# Patient Record
Sex: Female | Born: 1995
Health system: Southern US, Community
[De-identification: ages and names within clinical notes are randomized; demographics above are authoritative.]

## PROBLEM LIST (undated history)

## (undated) DIAGNOSIS — F419 Anxiety disorder, unspecified: Secondary | ICD-10-CM

## (undated) DIAGNOSIS — T7840XA Allergy, unspecified, initial encounter: Secondary | ICD-10-CM

## (undated) DIAGNOSIS — F259 Schizoaffective disorder, unspecified: Secondary | ICD-10-CM

## (undated) DIAGNOSIS — F32A Depression, unspecified: Secondary | ICD-10-CM

## (undated) DIAGNOSIS — F319 Bipolar disorder, unspecified: Secondary | ICD-10-CM

## (undated) DIAGNOSIS — L309 Dermatitis, unspecified: Secondary | ICD-10-CM

## (undated) DIAGNOSIS — F312 Bipolar disorder, current episode manic severe with psychotic features: Secondary | ICD-10-CM

## (undated) DIAGNOSIS — F909 Attention-deficit hyperactivity disorder, unspecified type: Secondary | ICD-10-CM

## (undated) HISTORY — DX: Bipolar disorder, current episode manic severe with psychotic features: F31.2

## (undated) HISTORY — DX: Allergy, unspecified, initial encounter: T78.40XA

## (undated) HISTORY — DX: Attention-deficit hyperactivity disorder, unspecified type: F90.9

## (undated) HISTORY — DX: Bipolar disorder, unspecified: F31.9

## (undated) HISTORY — DX: Dermatitis, unspecified: L30.9

---
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
## 2013-01-13 ENCOUNTER — Telehealth: Payer: Self-pay | Admitting: Family Medicine

## 2013-01-13 MED ORDER — AMPHETAMINE-DEXTROAMPHET ER 30 MG PO CP24: 30.0000 mg | ORAL_CAPSULE | ORAL | Status: DC

## 2013-01-13 NOTE — Telephone Encounter (Signed)
Patient's Mom is calling requesting a refill for the patient's Adderall. She states that the patient is taking exams this week and only has one left.

## 2013-01-13 NOTE — Telephone Encounter (Signed)
Patient's mother aware Rx ready for pick up in the morning She will have Dow Adolph pick it up.      KP

## 2013-03-16 ENCOUNTER — Encounter: Payer: Self-pay | Admitting: Family Medicine

## 2013-03-16 ENCOUNTER — Ambulatory Visit (INDEPENDENT_AMBULATORY_CARE_PROVIDER_SITE_OTHER): Payer: BC Managed Care – PPO | Admitting: Family Medicine

## 2013-03-16 VITALS — BP 96/54 | HR 75 | Temp 98.5°F | Ht 63.75 in | Wt 114.2 lb

## 2013-03-16 DIAGNOSIS — Z0000 Encounter for general adult medical examination without abnormal findings: Secondary | ICD-10-CM

## 2013-03-16 DIAGNOSIS — Z0184 Encounter for antibody response examination: Secondary | ICD-10-CM

## 2013-03-16 DIAGNOSIS — Z3009 Encounter for other general counseling and advice on contraception: Secondary | ICD-10-CM | POA: Insufficient documentation

## 2013-03-16 DIAGNOSIS — Z9189 Other specified personal risk factors, not elsewhere classified: Secondary | ICD-10-CM

## 2013-03-16 DIAGNOSIS — Z309 Encounter for contraceptive management, unspecified: Secondary | ICD-10-CM | POA: Insufficient documentation

## 2013-03-16 DIAGNOSIS — Z23 Encounter for immunization: Secondary | ICD-10-CM

## 2013-03-16 DIAGNOSIS — Z202 Contact with and (suspected) exposure to infections with a predominantly sexual mode of transmission: Secondary | ICD-10-CM

## 2013-03-16 HISTORY — DX: Encounter for general adult medical examination without abnormal findings: Z00.00

## 2013-03-16 LAB — HEPATIC FUNCTION PANEL
ALT: 10 U/L (ref 0–35)
AST: 15 U/L (ref 0–37)
Alkaline Phosphatase: 49 U/L (ref 39–117)
Bilirubin, Direct: 0.1 mg/dL (ref 0.0–0.3)
Total Protein: 7.6 g/dL (ref 6.0–8.3)

## 2013-03-16 LAB — BASIC METABOLIC PANEL
CO2: 26 mEq/L (ref 19–32)
Chloride: 106 mEq/L (ref 96–112)
Sodium: 138 mEq/L (ref 135–145)

## 2013-03-16 LAB — LIPID PANEL
LDL Cholesterol: 106 mg/dL — ABNORMAL HIGH (ref 0–99)
Total CHOL/HDL Ratio: 3

## 2013-03-16 LAB — CBC WITH DIFFERENTIAL/PLATELET
Basophils Absolute: 0 10*3/uL (ref 0.0–0.1)
Basophils Relative: 0.5 % (ref 0.0–3.0)
Eosinophils Absolute: 0.3 10*3/uL (ref 0.0–0.7)
Hemoglobin: 13.3 g/dL (ref 12.0–15.0)
Lymphocytes Relative: 43.2 % (ref 12.0–46.0)
MCHC: 32.7 g/dL (ref 30.0–36.0)
MCV: 92.5 fl (ref 78.0–100.0)
Monocytes Absolute: 0.3 10*3/uL (ref 0.1–1.0)
Neutro Abs: 2 10*3/uL (ref 1.4–7.7)
RDW: 13.8 % (ref 11.5–14.6)

## 2013-03-16 MED ORDER — AMPHETAMINE-DEXTROAMPHET ER 30 MG PO CP24: 30.0000 mg | ORAL_CAPSULE | ORAL | Status: DC

## 2013-03-16 NOTE — Progress Notes (Signed)
  Subjective:    Patient ID: [name], female    DOB: [birth year], 17 y.o.   MRN: [id number]  HPI CPE- no concerns, did have sex for 1st time last month and would like checked for STDs despite using condom.  Not interested in starting OCPs- 'i'm not doing that again'.   Review of Systems Patient reports no vision/ hearing changes, adenopathy,fever, weight change,  persistant/recurrent hoarseness , swallowing issues, chest pain, palpitations, edema, persistant/recurrent cough, hemoptysis, dyspnea (rest/exertional/paroxysmal nocturnal), gastrointestinal bleeding (melena, rectal bleeding), abdominal pain, significant heartburn, bowel changes, GU symptoms (dysuria, hematuria, incontinence), Gyn symptoms (abnormal  bleeding, pain),  syncope, focal weakness, memory loss, numbness & tingling, skin/hair/nail changes, abnormal bruising or bleeding, anxiety, or depression.     Objective:   Physical Exam General Appearance:    Alert, cooperative, no distress, appears stated age  Head:    Normocephalic, without obvious abnormality, atraumatic  Eyes:    PERRL, conjunctiva/corneas clear, EOM's intact, fundi    benign, both eyes  Ears:    Normal TM's and external ear canals, both ears  Nose:   Nares normal, septum midline, mucosa normal, no drainage    or sinus tenderness  Throat:   Lips, mucosa, and tongue normal; teeth and gums normal  Neck:   Supple, symmetrical, trachea midline, no adenopathy;    Thyroid: no enlargement/tenderness/nodules  Back:     Symmetric, no curvature, ROM normal, no CVA tenderness  Lungs:     Clear to auscultation bilaterally, respirations unlabored  Chest Wall:    No tenderness or deformity   Heart:    Regular rate and rhythm, S1 and S2 normal, no murmur, rub   or gallop  Breast Exam:    Deferred to GYN  Abdomen:     Soft, non-tender, bowel sounds active all four quadrants,    no masses, no organomegaly  Genitalia:    Deferred to GYN  Rectal:    Extremities:    Extremities normal, atraumatic, no cyanosis or edema  Pulses:   2+ and symmetric all extremities  Skin:   Skin color, texture, turgor normal, no rashes or lesions  Lymph nodes:   Cervical, supraclavicular, and axillary nodes normal  Neurologic:   CNII-XII intact, normal strength, sensation and reflexes    throughout          Assessment & Plan:

## 2013-03-16 NOTE — Patient Instructions (Addendum)
Follow up in 1 year or as needed Keep up the good work!  You look great! We'll fax all your completed forms to school Call with any questions or concerns GOOD LUCK!

## 2013-03-16 NOTE — Assessment & Plan Note (Signed)
Pt's PE WNL.  Check labs- including STDs.  Forms completed for college.  Immunizations given.  Anticipatory guidance provided.

## 2013-03-16 NOTE — Addendum Note (Signed)
Addended by: Verdie Shire on: 03/16/2013 01:39 PM   Modules accepted: Orders

## 2013-03-17 ENCOUNTER — Telehealth: Payer: Self-pay | Admitting: Family Medicine

## 2013-03-17 LAB — GC/CHLAMYDIA PROBE AMP, URINE: GC Probe Amp, Urine: NEGATIVE

## 2013-03-17 LAB — HIV ANTIBODY (ROUTINE TESTING W REFLEX): HIV: NONREACTIVE

## 2013-03-17 LAB — RPR

## 2013-03-17 LAB — VARICELLA ZOSTER ANTIBODY, IGG: Varicella IgG: >4000 Index — ABNORMAL HIGH (ref <135.00)

## 2013-03-17 NOTE — Telephone Encounter (Signed)
03/17/2013  Authorization has been obtained for Amphetamine salts er for 30 mg sig take one tab every morning. This medication is approved for 1 year start date 02/24/13 to 03/17/14 Case # [id number]

## 2013-03-17 NOTE — Telephone Encounter (Signed)
Patient's Mom called upset because she says that [facility] refuses to fill the patient's Adderall Rx without authorization to do so from our office.

## 2013-03-18 ENCOUNTER — Telehealth: Payer: Self-pay | Admitting: *Deleted

## 2013-03-18 NOTE — Telephone Encounter (Signed)
error 

## 2013-03-18 NOTE — Telephone Encounter (Signed)
LM @ (9:57am) asking the pt to RTC regarding school form needing to signed before sent to the school.//AB/CMA

## 2013-03-20 ENCOUNTER — Telehealth: Payer: Self-pay | Admitting: *Deleted

## 2013-03-20 NOTE — Telephone Encounter (Signed)
Pt mother called in with concerns with her daughter taking Adderall. Pt is having mood swings, outbursts,and anxiety. Mother advised patient not to take any more of the medication until she speaks with a provider. //SW//CMA

## 2013-03-20 NOTE — Telephone Encounter (Signed)
Detailed MSG left advising the patient to d/c med's and follow up on Monday with Dr.Lowne.      KP

## 2013-03-20 NOTE — Telephone Encounter (Signed)
Yes , the adderall can cause that.  It may be another issue---- don't take any more--  Can she come in Monday?

## 2013-03-20 NOTE — Telephone Encounter (Signed)
patient is having mood issues since starting the Adderall.  Please advise if you want the patient to come in or if you want to change med's.      KP

## 2013-03-23 NOTE — Progress Notes (Signed)
Forms completed but must have pts signature before we can fax. Left message on pts voice mail that she needs to sign forms.

## 2013-03-25 NOTE — Telephone Encounter (Signed)
Pt picked up form.//AB/CMA

## 2013-03-27 ENCOUNTER — Telehealth: Payer: Self-pay | Admitting: Family Medicine

## 2013-03-27 NOTE — Telephone Encounter (Signed)
Patient's mother called stating that they need to sign something on her school form. In the last phone note it states the patient already picked this up. Does anyone know if she still needs to sign something?

## 2013-03-30 ENCOUNTER — Ambulatory Visit: Payer: BC Managed Care – PPO | Admitting: Family Medicine

## 2013-03-30 NOTE — Telephone Encounter (Signed)
Called and LM on Friday informing the pt's mother that the formed was picked up and that the pt needed to sign the form before the form is to be turned in.//AB/CMA

## 2013-04-02 ENCOUNTER — Ambulatory Visit (INDEPENDENT_AMBULATORY_CARE_PROVIDER_SITE_OTHER): Payer: BC Managed Care – PPO | Admitting: Family Medicine

## 2013-04-02 ENCOUNTER — Encounter: Payer: Self-pay | Admitting: Family Medicine

## 2013-04-02 VITALS — BP 110/70 | HR 85 | Temp 98.5°F | Wt 120.0 lb

## 2013-04-02 DIAGNOSIS — F988 Other specified behavioral and emotional disorders with onset usually occurring in childhood and adolescence: Secondary | ICD-10-CM

## 2013-04-02 MED ORDER — LISDEXAMFETAMINE DIMESYLATE 30 MG PO CAPS: 30.0000 mg | ORAL_CAPSULE | ORAL | Status: DC

## 2013-04-02 NOTE — Patient Instructions (Signed)
Attention Deficit Hyperactivity Disorder Attention deficit hyperactivity disorder (ADHD) is a problem with behavior issues based on the way the brain functions (neurobehavioral disorder). It is a common reason for behavior and academic problems in school. CAUSES  The cause of ADHD is unknown in most cases. It may run in families. It sometimes can be associated with learning disabilities and other behavioral problems. SYMPTOMS  There are 3 types of ADHD. The 3 types and some of the symptoms include:  Inattentive  Gets bored or distracted easily.  Loses or forgets things. Forgets to hand in homework.  Has trouble organizing or completing tasks.  Difficulty staying on task.  An inability to organize daily tasks and school work.  Leaving projects, chores, or homework unfinished.  Trouble paying attention or responding to details. Careless mistakes.  Difficulty following directions. Often seems like is not listening.  Dislikes activities that require sustained attention (like chores or homework).  Hyperactive-impulsive  Feels like it is impossible to sit still or stay in a seat. Fidgeting with hands and feet.  Trouble waiting turn.  Talking too much or out of turn. Interruptive.  Speaks or acts impulsively.  Aggressive, disruptive behavior.  Constantly busy or on the go, noisy.  Combined  Has symptoms of both of the above. Often children with ADHD feel discouraged about themselves and with school. They often perform well below their abilities in school. These symptoms can cause problems in home, school, and in relationships with peers. As children get older, the excess motor activities can calm down, but the problems with paying attention and staying organized persist. Most children do not outgrow ADHD but with good treatment can learn to cope with the symptoms. DIAGNOSIS  When ADHD is suspected, the diagnosis should be made by professionals trained in ADHD.  Diagnosis will  include:  Ruling out other reasons for the child's behavior.  The caregivers will check with the child's school and check their medical records.  They will talk to teachers and parents.  Behavior rating scales for the child will be filled out by those dealing with the child on a daily basis. A diagnosis is made only after all information has been considered. TREATMENT  Treatment usually includes behavioral treatment often along with medicines. It may include stimulant medicines. The stimulant medicines decrease impulsivity and hyperactivity and increase attention. Other medicines used include antidepressants and certain blood pressure medicines. Most experts agree that treatment for ADHD should address all aspects of the child's functioning. Treatment should not be limited to the use of medicines alone. Treatment should include structured classroom management. The parents must receive education to address rewarding good behavior, discipline, and limit-setting. Tutoring or behavioral therapy or both should be available for the child. If untreated, the disorder can have long-term serious effects into adolescence and adulthood. HOME CARE INSTRUCTIONS   Often with ADHD there is a lot of frustration among the family in dealing with the illness. There is often blame and anger that is not warranted. This is a life long illness. There is no way to prevent ADHD. In many cases, because the problem affects the family as a whole, the entire family may need help. A therapist can help the family find better ways to handle the disruptive behaviors and promote change. If the child is young, most of the therapist's work is with the parents. Parents will learn techniques for coping with and improving their child's behavior. Sometimes only the child with the ADHD needs counseling. Your caregivers can help   you make these decisions.  Children with ADHD may need help in organizing. Some helpful tips include:  Keep  routines the same every day from wake-up time to bedtime. Schedule everything. This includes homework and playtime. This should include outdoor and indoor recreation. Keep the schedule on the refrigerator or a bulletin board where it is frequently seen. Mark schedule changes as far in advance as possible.  Have a place for everything and keep everything in its place. This includes clothing, backpacks, and school supplies.  Encourage writing down assignments and bringing home needed books.  Offer your child a well-balanced diet. Breakfast is especially important for school performance. Children should avoid drinks with caffeine including:  Soft drinks.  Coffee.  Tea.  However, some older children (adolescents) may find these drinks helpful in improving their attention.  Children with ADHD need consistent rules that they can understand and follow. If rules are followed, give small rewards. Children with ADHD often receive, and expect, criticism. Look for good behavior and praise it. Set realistic goals. Give clear instructions. Look for activities that can foster success and self-esteem. Make time for pleasant activities with your child. Give lots of affection.  Parents are their children's greatest advocates. Learn as much as possible about ADHD. This helps you become a stronger and better advocate for your child. It also helps you educate your child's teachers and instructors if they feel inadequate in these areas. Parent support groups are often helpful. A national group with local chapters is called CHADD (Children and Adults with Attention Deficit Hyperactivity Disorder). PROGNOSIS  There is no cure for ADHD. Children with the disorder seldom outgrow it. Many find adaptive ways to accommodate the ADHD as they mature. SEEK MEDICAL CARE IF:  Your child has repeated muscle twitches, cough or speech outbursts.  Your child has sleep problems.  Your child has a marked loss of  appetite.  Your child develops depression.  Your child has new or worsening behavioral problems.  Your child develops dizziness.  Your child has a racing heart.  Your child has stomach pains.  Your child develops headaches. Document Released: 07/20/2002 Document Revised: 10/22/2011 Document Reviewed: 03/01/2008 [employer] Patient Information 2014 [employer].  

## 2013-04-03 ENCOUNTER — Telehealth: Payer: Self-pay

## 2013-04-03 NOTE — Telephone Encounter (Signed)
Message left on triage voicemail: patient took rx to the pharmacy and was told we will need to do a prior authorization in order for rx to be dispensed.   I called patient's mother left message on voicemail to inform her message received and I will forward info to Aisha (Materials engineer), once proper paperwork with PA number received from the pharmacy the PA process will be started. If patient's mother with questions or concerns I instructed her to call back

## 2013-04-03 NOTE — Telephone Encounter (Signed)
A prior Authorization was started 04/02/2013 for the prescription vyvanse. It could take up to 72 hours to get a response from the insurance.  When we hear something back from the insurance all information will be forwarded to the Pharmacy so that the prescription can be approved.  Ag cma

## 2013-04-04 NOTE — Progress Notes (Signed)
  Subjective:    Patient ID: [name], female    DOB: May 09, 1996, 17 y.o.   MRN: [id number]  HPI Pt here to discuss changing adderall but it caused her to have mood swings, inc anger, anxiety etc.  No other complaints.   Review of Systems    as above Objective:   Physical Exam  BP 110/70  Pulse 85  Temp(Src) 98.5 F (36.9 C)  Wt 120 lb (54.432 kg)  BMI 20.77 kg/m2  SpO2 98%  LMP 03/09/2013 General appearance: alert, cooperative, appears stated age and no distress Neck: no adenopathy, supple, symmetrical, trachea midline and thyroid not enlarged, symmetric, no tenderness/mass/nodules Lungs: clear to auscultation bilaterally Heart: S1, S2 normal Extremities: extremities normal, atraumatic, no cyanosis or edema       Assessment & Plan:

## 2013-04-04 NOTE — Assessment & Plan Note (Signed)
D/c adderall vyvanse rx given to pt  rto 1 month

## 2013-04-07 ENCOUNTER — Encounter: Payer: Self-pay | Admitting: Family Medicine

## 2013-04-09 NOTE — Telephone Encounter (Signed)
04/09/2013 the patient has been approved for Vyvanse effective dates are 03/18/2013 thru 04/08/14.  This information will be forwarded to patient pharmacy and then scanned into epic.  Ag cma

## 2013-06-09 ENCOUNTER — Telehealth: Payer: Self-pay | Admitting: Family Medicine

## 2013-06-09 MED ORDER — AMPHETAMINE-DEXTROAMPHET ER 30 MG PO CP24: 30.0000 mg | ORAL_CAPSULE | ORAL | Status: DC

## 2013-06-09 NOTE — Telephone Encounter (Signed)
Please advise      KP 

## 2013-06-09 NOTE — Telephone Encounter (Signed)
Ok to switch to adderall xr 30 mg #30  1 po qam

## 2013-06-09 NOTE — Telephone Encounter (Signed)
Patient's Mom says that the Vyvanse did not work for the patient and she has had her recently taking some Adderall that the patient had left from before Dr. Laury Axon had her stop using it. Mom states that the patient has had her grades improve since using the Adderall and it seems to be working well. She wants to know if she can start back taking the Adderall. Please advise.

## 2013-06-09 NOTE — Telephone Encounter (Signed)
MSG left advising Rx ready for pick up.     KP 

## 2013-08-26 ENCOUNTER — Other Ambulatory Visit: Payer: Self-pay | Admitting: Family Medicine

## 2013-08-26 DIAGNOSIS — F988 Other specified behavioral and emotional disorders with onset usually occurring in childhood and adolescence: Secondary | ICD-10-CM

## 2013-08-26 MED ORDER — AMPHETAMINE-DEXTROAMPHET ER 30 MG PO CP24: 30.0000 mg | ORAL_CAPSULE | ORAL | Status: DC

## 2013-12-11 DIAGNOSIS — L309 Dermatitis, unspecified: Secondary | ICD-10-CM

## 2013-12-11 HISTORY — DX: Dermatitis, unspecified: L30.9

## 2014-01-08 ENCOUNTER — Ambulatory Visit: Payer: BC Managed Care – PPO | Admitting: Family Medicine

## 2014-01-08 DIAGNOSIS — Z0289 Encounter for other administrative examinations: Secondary | ICD-10-CM

## 2014-01-18 ENCOUNTER — Ambulatory Visit (INDEPENDENT_AMBULATORY_CARE_PROVIDER_SITE_OTHER): Payer: BC Managed Care – PPO | Admitting: Family Medicine

## 2014-01-18 ENCOUNTER — Encounter: Payer: Self-pay | Admitting: Family Medicine

## 2014-01-18 VITALS — BP 110/68 | HR 69 | Temp 98.7°F | Wt 124.0 lb

## 2014-01-18 DIAGNOSIS — Z309 Encounter for contraceptive management, unspecified: Secondary | ICD-10-CM

## 2014-01-18 DIAGNOSIS — R82998 Other abnormal findings in urine: Secondary | ICD-10-CM

## 2014-01-18 DIAGNOSIS — Z202 Contact with and (suspected) exposure to infections with a predominantly sexual mode of transmission: Secondary | ICD-10-CM

## 2014-01-18 DIAGNOSIS — R8290 Unspecified abnormal findings in urine: Secondary | ICD-10-CM

## 2014-01-18 LAB — POCT URINALYSIS DIPSTICK
BILIRUBIN UA: NEGATIVE
GLUCOSE UA: NEGATIVE
Ketones, UA: NEGATIVE
Nitrite, UA: NEGATIVE
Protein, UA: NEGATIVE
RBC UA: NEGATIVE
Spec Grav, UA: >=1.03
Urobilinogen, UA: 0.2
pH, UA: 5

## 2014-01-18 LAB — POCT URINE PREGNANCY: Preg Test, Ur: NEGATIVE

## 2014-01-18 MED ORDER — NORGESTIMATE-ETH ESTRADIOL 0.25-35 MG-MCG PO TABS: 1.0000 | ORAL_TABLET | Freq: Every day | ORAL | Status: DC

## 2014-01-18 NOTE — Progress Notes (Signed)
Pre visit review using our clinic review tool, if applicable. No additional management support is needed unless otherwise documented below in the visit note. 

## 2014-01-18 NOTE — Addendum Note (Signed)
Addended by: Silvio Pate D on: 01/18/2014 03:32 PM   Modules accepted: Orders

## 2014-01-18 NOTE — Progress Notes (Signed)
   Subjective:    Patient ID: [name], female    DOB: 1996-04-15, 18 y.o.   MRN: [id number]  HPI Pt here to discuss bcp options.  She would like to be on the same pill her sister is on.   She is not currently sexually active but has been in past.  No other complaints.   Review of Systems    as above Objective:   Physical Exam BP 110/68  Pulse 69  Temp(Src) 98.7 F (37.1 C) (Oral)  Wt 124 lb (56.246 kg)  SpO2 97%  LMP 12/30/2013 General appearance: alert, cooperative, appears stated age and no distress Neurologic: Alert and oriented X 3, normal strength and tone. Normal symmetric reflexes. Normal coordination and gait       Assessment & Plan:  1. Contraception management  - POCT urine pregnancy - norgestimate-ethinyl estradiol (SPRINTEC 28) 0.25-35 MG-MCG tablet; Take 1 tablet by mouth daily.  Dispense: 3 Package; Refill: 3 - GC/chlamydia probe amp, urine - HIV antibody - RPR - HSV 2 antibody, IgG - POCT urinalysis dipstick - POCT urine pregnancy  2. Potential exposure to STD  - GC/chlamydia probe amp, urine - HIV antibody - RPR - HSV 2 antibody, IgG - POCT urinalysis dipstick - POCT urine pregnancy

## 2014-01-18 NOTE — Patient Instructions (Signed)
Contraception Choices Contraception (birth control) is the use of any methods or devices to prevent pregnancy. Below are some methods to help avoid pregnancy. HORMONAL METHODS   Contraceptive implant This is a thin, plastic tube containing progesterone hormone. It does not contain estrogen hormone. Your health care provider inserts the tube in the inner part of the upper arm. The tube can remain in place for up to 3 years. After 3 years, the implant must be removed. The implant prevents the ovaries from releasing an egg (ovulation), thickens the cervical mucus to prevent sperm from entering the uterus, and thins the lining of the inside of the uterus.  Progesterone-only injections These injections are given every 3 months by your health care provider to prevent pregnancy. This synthetic progesterone hormone stops the ovaries from releasing eggs. It also thickens cervical mucus and changes the uterine lining. This makes it harder for sperm to survive in the uterus.  Birth control pills These pills contain estrogen and progesterone hormone. They work by preventing the ovaries from releasing eggs (ovulation). They also cause the cervical mucus to thicken, preventing the sperm from entering the uterus. Birth control pills are prescribed by a health care provider.Birth control pills can also be used to treat heavy periods.  Minipill This type of birth control pill contains only the progesterone hormone. They are taken every day of each month and must be prescribed by your health care provider.  Birth control patch The patch contains hormones similar to those in birth control pills. It must be changed once a week and is prescribed by a health care provider.  Vaginal ring The ring contains hormones similar to those in birth control pills. It is left in the vagina for 3 weeks, removed for 1 week, and then a new one is put back in place. The patient must be comfortable inserting and removing the ring from the  vagina.A health care provider's prescription is necessary.  Emergency contraception Emergency contraceptives prevent pregnancy after unprotected sexual intercourse. This pill can be taken right after sex or up to 5 days after unprotected sex. It is most effective the sooner you take the pills after having sexual intercourse. Most emergency contraceptive pills are available without a prescription. Check with your pharmacist. Do not use emergency contraception as your only form of birth control. BARRIER METHODS   Female condom This is a thin sheath (latex or rubber) that is worn over the penis during sexual intercourse. It can be used with spermicide to increase effectiveness.  Female condom. This is a soft, loose-fitting sheath that is put into the vagina before sexual intercourse.  Diaphragm This is a soft, latex, dome-shaped barrier that must be fitted by a health care provider. It is inserted into the vagina, along with a spermicidal jelly. It is inserted before intercourse. The diaphragm should be left in the vagina for 6 to 8 hours after intercourse.  Cervical cap This is a round, soft, latex or plastic cup that fits over the cervix and must be fitted by a health care provider. The cap can be left in place for up to 48 hours after intercourse.  Sponge This is a soft, circular piece of polyurethane foam. The sponge has spermicide in it. It is inserted into the vagina after wetting it and before sexual intercourse.  Spermicides These are chemicals that kill or block sperm from entering the cervix and uterus. They come in the form of creams, jellies, suppositories, foam, or tablets. They do not require a   prescription. They are inserted into the vagina with an applicator before having sexual intercourse. The process must be repeated every time you have sexual intercourse. INTRAUTERINE CONTRACEPTION  Intrauterine device (IUD) This is a T-shaped device that is put in a woman's uterus during a  menstrual period to prevent pregnancy. There are 2 types:  Copper IUD This type of IUD is wrapped in copper wire and is placed inside the uterus. Copper makes the uterus and fallopian tubes produce a fluid that kills sperm. It can stay in place for 10 years.  Hormone IUD This type of IUD contains the hormone progestin (synthetic progesterone). The hormone thickens the cervical mucus and prevents sperm from entering the uterus, and it also thins the uterine lining to prevent implantation of a fertilized egg. The hormone can weaken or kill the sperm that get into the uterus. It can stay in place for 3 5 years, depending on which type of IUD is used. PERMANENT METHODS OF CONTRACEPTION  Female tubal ligation This is when the woman's fallopian tubes are surgically sealed, tied, or blocked to prevent the egg from traveling to the uterus.  Hysteroscopic sterilization This involves placing a small coil or insert into each fallopian tube. Your doctor uses a technique called hysteroscopy to do the procedure. The device causes scar tissue to form. This results in permanent blockage of the fallopian tubes, so the sperm cannot fertilize the egg. It takes about 3 months after the procedure for the tubes to become blocked. You must use another form of birth control for these 3 months.  Female sterilization This is when the female has the tubes that carry sperm tied off (vasectomy).This blocks sperm from entering the vagina during sexual intercourse. After the procedure, the man can still ejaculate fluid (semen). NATURAL PLANNING METHODS  Natural family planning This is not having sexual intercourse or using a barrier method (condom, diaphragm, cervical cap) on days the woman could become pregnant.  Calendar method This is keeping track of the length of each menstrual cycle and identifying when you are fertile.  Ovulation method This is avoiding sexual intercourse during ovulation.  Symptothermal method This is  avoiding sexual intercourse during ovulation, using a thermometer and ovulation symptoms.  Post ovulation method This is timing sexual intercourse after you have ovulated. Regardless of which type or method of contraception you choose, it is important that you use condoms to protect against the transmission of sexually transmitted infections (STIs). Talk with your health care provider about which form of contraception is most appropriate for you. Document Released: 07/30/2005 Document Revised: 04/01/2013 Document Reviewed: 01/22/2013 [facility] Patient Information 2014 [place], [place]. Oral Contraception Information Oral contraceptive pills (OCPs) are medicines taken to prevent pregnancy. OCPs work by preventing the ovaries from releasing eggs. The hormones in OCPs also cause the cervical mucus to thicken, preventing the sperm from entering the uterus. The hormones also cause the uterine lining to become thin, not allowing a fertilized egg to attach to the inside of the uterus. OCPs are highly effective when taken exactly as prescribed. However, OCPs do not prevent sexually transmitted diseases (STDs). Safe sex practices, such as using condoms along with the pill, can help prevent STDs.  Before taking the pill, you may have a physical exam and Pap test. Your health care provider may order blood tests. The health care provider will make sure you are a good candidate for oral contraception. Discuss with your health care provider the possible side effects of the OCP you may  be prescribed. When starting an OCP, it can take 2 to 3 months for the body to adjust to the changes in hormone levels in your body.  TYPES OF ORAL CONTRACEPTION  The combination pill This pill contains estrogen and progestin (synthetic progesterone) hormones. The combination pill comes in 21-day, 28-day, or 91-day packs. Some types of combination pills are meant to be taken continuously (365-day pills). With 21-day packs, you do not take  pills for 7 days after the last pill. With 28-day packs, the pill is taken every day. The last 7 pills are without hormones. Certain types of pills have more than 21 hormone-containing pills. With 91-day packs, the first 84 pills contain both hormones, and the last 7 pills contain no hormones or contain estrogen only.  The minipill This pill contains the progesterone hormone only. The pill is taken every day continuously. It is very important to take the pill at the same time each day. The minipill comes in packs of 28 pills. All 28 pills contain the hormone.  ADVANTAGES OF ORAL CONTRACEPTIVE PILLS  Decreases premenstrual symptoms.   Treats menstrual period cramps.   Regulates the menstrual cycle.   Decreases a heavy menstrual flow.   May treatacne, depending on the type of pill.   Treats abnormal uterine bleeding.   Treats polycystic ovarian syndrome.   Treats endometriosis.   Can be used as emergency contraception.  THINGS THAT CAN MAKE ORAL CONTRACEPTIVE PILLS LESS EFFECTIVE OCPs can be less effective if:   You forget to take the pill at the same time every day.   You have a stomach or intestinal disease that lessens the absorption of the pill.   You take OCPs with other medicines that make OCPs less effective, such as antibiotics, certain HIV medicines, and some seizure medicines.   You take expired OCPs.   You forget to restart the pill on day 7, when using the packs of 21 pills.  RISKS ASSOCIATED WITH ORAL CONTRACEPTIVE PILLS  Oral contraceptive pills can sometimes cause side effects, such as:  Headache.  Nausea.  Breast tenderness.  Irregular bleeding or spotting. Combination pills are also associated with a small increased risk of:  Blood clots.  Heart attack.  Stroke. Document Released: 10/20/2002 Document Revised: 05/20/2013 Document Reviewed: 01/18/2013 [facility] Patient Information 2014 [place].

## 2014-01-19 ENCOUNTER — Telehealth: Payer: Self-pay

## 2014-01-19 LAB — GC/CHLAMYDIA PROBE AMP, URINE
Chlamydia, Swab/Urine, PCR: POSITIVE — AB
GC PROBE AMP, URINE: NEGATIVE

## 2014-01-19 LAB — URINE CULTURE
Colony Count: NO GROWTH
Organism ID, Bacteria: NO GROWTH

## 2014-01-19 LAB — RPR

## 2014-01-19 LAB — HSV 2 ANTIBODY, IGG: HSV 2 Glycoprotein G Ab, IgG: <0.1 IV

## 2014-01-19 LAB — HIV ANTIBODY (ROUTINE TESTING W REFLEX): HIV: NONREACTIVE

## 2014-01-19 MED ORDER — AZITHROMYCIN 250 MG PO TABS: ORAL_TABLET | ORAL | Status: DC

## 2014-01-19 NOTE — Telephone Encounter (Signed)
Patient has been made of the results. I advised no intercourse until partner is treated, until she is completed treatment and 7 days after, I encouraged the use of condoms and abstinence if possible and made her aware this will be reported to [facility]. Form faxed       KP

## 2014-01-19 NOTE — Telephone Encounter (Signed)
Message copied by Arnette Norris on Tue Jan 19, 2014 11:37 AM ------      Message from: Lelon Perla      Created: Tue Jan 19, 2014 11:15 AM       + chlamydia---   zithromax 250 mg #4  4 po qd x 1       Her partner needs to go to the doctor and be treated. ------

## 2014-04-07 ENCOUNTER — Telehealth: Payer: Self-pay | Admitting: Family Medicine

## 2014-04-07 DIAGNOSIS — F988 Other specified behavioral and emotional disorders with onset usually occurring in childhood and adolescence: Secondary | ICD-10-CM

## 2014-04-07 MED ORDER — AMPHETAMINE-DEXTROAMPHET ER 30 MG PO CP24: 30.0000 mg | ORAL_CAPSULE | ORAL | Status: DC

## 2014-04-07 NOTE — Telephone Encounter (Signed)
Patient aware Rx ready for pick up.      KP 

## 2014-04-07 NOTE — Telephone Encounter (Signed)
Caller name: Carmon Sails Relation to pt: Call back [phone]   Reason for call: pt is requesting a refill on Rx amphetamine-dextroamphetamine (ADDERALL XR) 30 MG 24 hr.  Please call when complete

## 2014-06-22 ENCOUNTER — Ambulatory Visit (INDEPENDENT_AMBULATORY_CARE_PROVIDER_SITE_OTHER): Payer: BC Managed Care – PPO | Admitting: *Deleted

## 2014-06-22 ENCOUNTER — Ambulatory Visit: Payer: BC Managed Care – PPO

## 2014-06-22 DIAGNOSIS — Z23 Encounter for immunization: Secondary | ICD-10-CM

## 2014-06-28 ENCOUNTER — Telehealth: Payer: Self-pay | Admitting: Family Medicine

## 2014-06-28 NOTE — Telephone Encounter (Signed)
PA has been done -- we are waiting on the determination

## 2014-06-28 NOTE — Telephone Encounter (Signed)
PA initiated. Awaiting determination. JG//CMA 

## 2014-06-28 NOTE — Telephone Encounter (Signed)
Caller name: pam Relation to pt: mom  Call back number: [phone] Pharmacy:  Reason for call:   Patient mom states that she is still having a hard time filling patient adderall. Pharmacy is telling patient that this requires a prior auth and says that the # we should call to the insurance [phone] with [employer]. She says that this has been going on for over a month.

## 2014-06-29 NOTE — Telephone Encounter (Signed)
PA for Adderall approved effective 06/08/2014 through 06/29/2015. JG//CMA

## 2014-08-02 ENCOUNTER — Ambulatory Visit (INDEPENDENT_AMBULATORY_CARE_PROVIDER_SITE_OTHER): Payer: BC Managed Care – PPO | Admitting: Family Medicine

## 2014-08-02 ENCOUNTER — Encounter: Payer: Self-pay | Admitting: Family Medicine

## 2014-08-02 VITALS — BP 104/68 | HR 84 | Temp 98.0°F | Resp 16 | Ht 64.0 in | Wt 144.0 lb

## 2014-08-02 DIAGNOSIS — F988 Other specified behavioral and emotional disorders with onset usually occurring in childhood and adolescence: Secondary | ICD-10-CM

## 2014-08-02 DIAGNOSIS — F909 Attention-deficit hyperactivity disorder, unspecified type: Secondary | ICD-10-CM

## 2014-08-02 DIAGNOSIS — Z23 Encounter for immunization: Secondary | ICD-10-CM

## 2014-08-02 DIAGNOSIS — Z0000 Encounter for general adult medical examination without abnormal findings: Secondary | ICD-10-CM

## 2014-08-02 LAB — MICROALBUMIN / CREATININE URINE RATIO
Creatinine,U: 175.9 mg/dL
MICROALB UR: 0.6 mg/dL (ref 0.0–1.9)
Microalb Creat Ratio: 0.3 mg/g (ref 0.0–30.0)

## 2014-08-02 LAB — LIPID PANEL
Cholesterol: 153 mg/dL (ref 0–200)
HDL: 48.3 mg/dL (ref >39.00)
LDL Cholesterol: 94 mg/dL (ref 0–99)
NonHDL: 104.7
TRIGLYCERIDES: 53 mg/dL (ref 0.0–149.0)
Total CHOL/HDL Ratio: 3
VLDL: 10.6 mg/dL (ref 0.0–40.0)

## 2014-08-02 LAB — CBC WITH DIFFERENTIAL/PLATELET
BASOS ABS: 0 10*3/uL (ref 0.0–0.1)
Basophils Relative: 0.8 % (ref 0.0–3.0)
Eosinophils Absolute: 0.3 10*3/uL (ref 0.0–0.7)
Eosinophils Relative: 6.8 % — ABNORMAL HIGH (ref 0.0–5.0)
HCT: 39.2 % (ref 36.0–46.0)
Hemoglobin: 12.7 g/dL (ref 12.0–15.0)
LYMPHS PCT: 58 % — AB (ref 12.0–46.0)
Lymphs Abs: 2.9 10*3/uL (ref 0.7–4.0)
MCHC: 32.3 g/dL (ref 30.0–36.0)
MCV: 91.2 fl (ref 78.0–100.0)
MONO ABS: 0.4 10*3/uL (ref 0.1–1.0)
Monocytes Relative: 8.8 % (ref 3.0–12.0)
NEUTROS PCT: 25.6 % — AB (ref 43.0–77.0)
Neutro Abs: 1.3 10*3/uL — ABNORMAL LOW (ref 1.4–7.7)
PLATELETS: 251 10*3/uL (ref 150.0–400.0)
RBC: 4.3 Mil/uL (ref 3.87–5.11)
RDW: 13.4 % (ref 11.5–14.6)
WBC: 5 10*3/uL (ref 4.5–10.5)

## 2014-08-02 LAB — POCT URINALYSIS DIPSTICK
Bilirubin, UA: NEGATIVE
Glucose, UA: NEGATIVE
KETONES UA: NEGATIVE
Leukocytes, UA: NEGATIVE
Nitrite, UA: NEGATIVE
PROTEIN UA: NEGATIVE
RBC UA: NEGATIVE
Urobilinogen, UA: 2
pH, UA: 5

## 2014-08-02 LAB — POCT URINE PREGNANCY: Preg Test, Ur: NEGATIVE

## 2014-08-02 LAB — BASIC METABOLIC PANEL
BUN: 11 mg/dL (ref 6–23)
CHLORIDE: 104 meq/L (ref 96–112)
CO2: 25 meq/L (ref 19–32)
Calcium: 9 mg/dL (ref 8.4–10.5)
Creatinine, Ser: 0.6 mg/dL (ref 0.4–1.2)
GFR: 166.3 mL/min (ref >60.00)
Glucose, Bld: 83 mg/dL (ref 70–99)
Potassium: 3.6 mEq/L (ref 3.5–5.1)
Sodium: 137 mEq/L (ref 135–145)

## 2014-08-02 LAB — HEPATIC FUNCTION PANEL
ALT: 9 U/L (ref 0–35)
AST: 13 U/L (ref 0–37)
Albumin: 4.1 g/dL (ref 3.5–5.2)
Alkaline Phosphatase: 44 U/L (ref 39–117)
BILIRUBIN TOTAL: 0.3 mg/dL (ref 0.2–1.2)
Bilirubin, Direct: 0 mg/dL (ref 0.0–0.3)
Total Protein: 7.1 g/dL (ref 6.0–8.3)

## 2014-08-02 NOTE — Progress Notes (Deleted)
Psychiatric Assessment Adult  Patient Identification:  Trula SladeShekinah Y Cassis Date of Evaluation:  08/02/2014 Chief Complaint: *** History of Chief Complaint:   Chief Complaint  Patient presents with  . Weight Loss  . Annual Exam    HPI Review of Systems Physical Exam  Depressive Symptoms: {DEPRESSION SYMPTOMS:20000}  (Hypo) Manic Symptoms:   Elevated Mood:  {BHH YES OR NO:22294} Irritable Mood:  {BHH YES OR NO:22294} Grandiosity:  {BHH YES OR NO:22294} Distractibility:  {BHH YES OR NO:22294} Labiality of Mood:  {BHH YES OR NO:22294} Delusions:  {BHH YES OR NO:22294} Hallucinations:  {BHH YES OR NO:22294} Impulsivity:  {BHH YES OR NO:22294} Sexually Inappropriate Behavior:  {BHH YES OR NO:22294} Financial Extravagance:  {BHH YES OR NO:22294} Flight of Ideas:  {BHH YES OR NO:22294}  Anxiety Symptoms: Excessive Worry:  {BHH YES OR NO:22294} Panic Symptoms:  {BHH YES OR NO:22294} Agoraphobia:  {BHH YES OR NO:22294} Obsessive Compulsive: {BHH YES OR NO:22294}  Symptoms: {Obsessive Compulsive Symptoms:22671} Specific Phobias:  {BHH YES OR NO:22294} Social Anxiety:  {BHH YES OR NO:22294}  Psychotic Symptoms:  Hallucinations: {BHH YES OR NO:22294} {Hallucinations:22672} Delusions:  {BHH YES OR NO:22294} Paranoia:  {BHH YES OR NO:22294}   Ideas of Reference:  {BHH YES OR NO:22294}  PTSD Symptoms: Ever had a traumatic exposure:  {BHH YES OR NO:22294} Had a traumatic exposure in the last month:  {BHH YES OR NO:22294} Re-experiencing: {BHH YES OR NO:22294} {Re-experiencing:22673} Hypervigilance:  {BHH YES OR NO:22294} Hyperarousal: {BHH YES OR NO:22294} {Hyperarousal:22674} Avoidance: {BHH YES OR NO:22294} {Avoidance:22675}  Traumatic Brain Injury: {BHH YES OR NO:22294} {Traumatic Brain Injury:22676}  Past Psychiatric History: Diagnosis: ***  Hospitalizations: ***  Outpatient Care: ***  Substance Abuse Care: ***  Self-Mutilation: ***  Suicidal Attempts: ***  Violent  Behaviors: ***   Past Medical History:   Past Medical History  Diagnosis Date  . ADHD (attention deficit hyperactivity disorder)   . Allergy   . Eczema 12/11/13  . Bipolar disorder    History of Loss of Consciousness:  {BHH YES OR NO:22294} Seizure History:  {BHH YES OR NO:22294} Cardiac History:  {BHH YES OR NO:22294} Allergies:   Allergies  Allergen Reactions  . Concerta [Methylphenidate] Other (See Comments)    hallucinations  . Clindamycin Hcl Rash  . Terbinafine Hcl Nausea And Vomiting   Current Medications:  Current Outpatient Prescriptions  Medication Sig Dispense Refill  . amphetamine-dextroamphetamine (ADDERALL XR) 30 MG 24 hr capsule Take 1 capsule (30 mg total) by mouth every morning. 30 capsule 0  . ELIDEL 1 % cream     . fexofenadine (ALLEGRA) 180 MG tablet Take 180 mg by mouth daily.      . mometasone (NASONEX) 50 MCG/ACT nasal spray Place 2 sprays into the nose daily as needed (allergies).     Marland Kitchen. PRESCRIPTION MEDICATION Apply 1 application topically 2 (two) times daily as needed (itching). triamcinolone and  mixed eucerin 1:1    . SEROQUEL XR 300 MG 24 hr tablet     . vitamin B-12 (CYANOCOBALAMIN) 1000 MCG tablet Take 1,000 mcg by mouth daily.    . norgestimate-ethinyl estradiol (SPRINTEC 28) 0.25-35 MG-MCG tablet Take 1 tablet by mouth daily. (Patient not taking: Reported on 08/02/2014) 3 Package 3   No current facility-administered medications for this visit.    Previous Psychotropic Medications:  Medication Dose   ***  ***                     Substance Abuse History in the  last 12 months: Substance Age of 1st Use Last Use Amount Specific Type  Nicotine  ***  ***  ***  ***  Alcohol  ***  ***  ***  ***  Cannabis  ***  ***  ***  ***  Opiates  ***  ***  ***  ***  Cocaine  ***  ***  ***  ***  Methamphetamines  ***  ***  ***  ***  LSD  ***  ***  ***  ***  Ecstasy  ***   ***  ***  ***  Benzodiazepines  ***  ***  ***  ***  Caffeine  ***  ***  ***  ***   Inhalants  ***  ***  ***  ***  Others:                          Medical Consequences of Substance Abuse: ***  Legal Consequences of Substance Abuse: ***  Family Consequences of Substance Abuse: ***  Blackouts:  {BHH YES OR NO:22294} DT's:  {BHH YES OR NO:22294} Withdrawal Symptoms:  {BHH YES OR NO:22294} {Withdrawal Symptoms:22677}  Social History: Current Place of Residence: *** Place of Birth: *** Family Members: *** Marital Status:  {Marital Status:22678} Children: ***  Sons: ***  Daughters: *** Relationships: *** Education:  {Education:22679} Educational Problems/Performance: *** Religious Beliefs/Practices: *** History of Abuse: {Desc; abuse:16542} Occupational Experiences; Military History:  {Military History:22680} Legal History: *** Hobbies/Interests: ***  Family History:   Family History  Problem Relation Age of Onset  . Asthma Mother   . Stroke Father   . Heart disease Father   . Asthma Father   . Hypertension Father     pulmonary hypertension  . Heart disease Maternal Uncle   . Hypertension Maternal Grandmother   . COPD Maternal Grandmother   . Diabetes Maternal Grandfather   . Stroke Paternal Grandmother   . Diabetes Paternal Grandmother   . Diabetes Paternal Grandfather   . Leukemia Maternal Uncle     Mental Status Examination/Evaluation: Objective:  Appearance: {Appearance:22683}  Eye Contact::  {BHH EYE CONTACT:22684}  Speech:  {Speech:22685}  Volume:  {Volume (PAA):22686}  Mood:  ***  Affect:  {Affect (PAA):22687}  Thought Process:  {Thought Process (PAA):22688}  Orientation:  {BHH ORIENTATION (PAA):22689}  Thought Content:  {Thought Content:22690}  Suicidal Thoughts:  {ST/HT (PAA):22692}  Homicidal Thoughts:  {ST/HT (PAA):22692}  Judgement:  {Judgement (PAA):22694}  Insight:  {Insight (PAA):22695}  Psychomotor Activity:  {Psychomotor (PAA):22696}  Akathisia:  {BHH YES OR NO:22294}  Handed:  {Handed:22697}  AIMS (if indicated):   ***  Assets:  {Assets (PAA):22698}    Laboratory/X-Ray Psychological Evaluation(s)   ***  ***   Assessment:  {axis diagnosis:3049000}  AXIS I {psych axis 1:31909}  AXIS II {psych axis 2:31910}  AXIS III Past Medical History  Diagnosis Date  . ADHD (attention deficit hyperactivity disorder)   . Allergy   . Eczema 12/11/13  . Bipolar disorder      AXIS IV {psych axis iv:31915}  AXIS V {psych axis v score:31919}   Treatment Plan/Recommendations:  Plan of Care: ***  Laboratory:  {Laboratory:22682}  Psychotherapy: ***  Medications: ***  Routine PRN Medications:  {BHH YES OR ZO:10960}O:22294}  Consultations: ***  Safety Concerns:  ***  Other:      Loreen FreudYvonne Lowne, DO 12/21/20158:51 AM

## 2014-08-02 NOTE — Progress Notes (Signed)
Subjective:     [name] is a 18 y.o. female and is here for a comprehensive physical exam. The patient reports no problems.  History   Social History  . Marital Status: Single    Spouse Name: N/A    Number of Children: N/A  . Years of Education: N/A   Occupational History  . Not on file.   Social History Main Topics  . Smoking status: Never Smoker   . Smokeless tobacco: Never Used  . Alcohol Use: No  . Drug Use: No  . Sexual Activity: Not on file   Other Topics Concern  . Not on file   Social History Narrative   Health Maintenance  Topic Date Due  . Janet BerlinETANUS/TDAP  01/19/2015 (Originally 02/03/2013)  . PAP SMEAR  01/19/2016 (Originally 02/04/2012)  . INFLUENZA VACCINE  03/14/2015    The following portions of the patient's history were reviewed and updated as appropriate:  She  has a past medical history of ADHD (attention deficit hyperactivity disorder); Allergy; Eczema (12/11/13); and Bipolar disorder. She  does not have any pertinent problems on file. She  has no past surgical history on file. Her family history includes Asthma in her father and mother; COPD in her maternal grandmother; Diabetes in her maternal grandfather, paternal grandfather, and paternal grandmother; Heart disease in her father and maternal uncle; Hypertension in her father and maternal grandmother; Leukemia in her maternal uncle; Stroke in her father and paternal grandmother. She  reports that she has never smoked. She has never used smokeless tobacco. She reports that she does not drink alcohol or use illicit drugs. She has a current medication list which includes the following prescription(s): amphetamine-dextroamphetamine, elidel, fexofenadine, mometasone, PRESCRIPTION MEDICATION, seroquel xr, and vitamin b-12. Current Outpatient Prescriptions on File Prior to Visit  Medication Sig Dispense Refill  . amphetamine-dextroamphetamine (ADDERALL XR) 30 MG 24 hr capsule Take 1 capsule (30 mg total)  by mouth every morning. 30 capsule 0  . ELIDEL 1 % cream     . fexofenadine (ALLEGRA) 180 MG tablet Take 180 mg by mouth daily.      . mometasone (NASONEX) 50 MCG/ACT nasal spray Place 2 sprays into the nose daily as needed (allergies).     Marland Kitchen. PRESCRIPTION MEDICATION Apply 1 application topically 2 (two) times daily as needed (itching). triamcinolone and  mixed eucerin 1:1    . vitamin B-12 (CYANOCOBALAMIN) 1000 MCG tablet Take 1,000 mcg by mouth daily.     No current facility-administered medications on file prior to visit.   She is allergic to concerta; clindamycin hcl; and terbinafine hcl..  Review of Systems Review of Systems  Constitutional: Negative for activity change, appetite change and fatigue.  HENT: Negative for hearing loss, congestion, tinnitus and ear discharge.  dentist [id number] Eyes: Negative for visual disturbance (see optho q1y -- vision corrected to 20/20 with glasses).  Respiratory: Negative for cough, chest tightness and shortness of breath.   Cardiovascular: Negative for chest pain, palpitations and leg swelling.  Gastrointestinal: Negative for abdominal pain, diarrhea, constipation and abdominal distention.  Genitourinary: Negative for urgency, frequency, decreased urine volume and difficulty urinating.  Musculoskeletal: Negative for back pain, arthralgias and gait problem.  Skin: Negative for color change, pallor and rash.  Neurological: Negative for dizziness, light-headedness, numbness and headaches.  Hematological: Negative for adenopathy. Does not bruise/bleed easily.  Psychiatric/Behavioral: Negative for suicidal ideas, confusion, sleep disturbance, self-injury, dysphoric mood, decreased concentration and agitation.      Objective:    BP  104/68 mmHg  Pulse 84  Temp(Src) 98 F (36.7 C) (Oral)  Resp 16  Ht 5\' 4"  (1.626 m)  Wt 144 lb (65.318 kg)  BMI 24.71 kg/m2  PF 97 L/min General appearance: alert, cooperative, appears stated age and no distress Head:  Normocephalic, without obvious abnormality, atraumatic Eyes: conjunctivae/corneas clear. PERRL, EOM's intact. Fundi benign. Ears: normal TM's and external ear canals both ears Nose: Nares normal. Septum midline. Mucosa normal. No drainage or sinus tenderness. Throat: lips, mucosa, and tongue normal; teeth and gums normal Neck: no adenopathy, supple, symmetrical, trachea midline and thyroid not enlarged, symmetric, no tenderness/mass/nodules Back: symmetric, no curvature. ROM normal. No CVA tenderness. Lungs: clear to auscultation bilaterally Breasts: normal appearance, no masses or tenderness Heart: regular rate and rhythm, S1, S2 normal, no murmur, click, rub or gallop Abdomen: soft, non-tender; bowel sounds normal; no masses,  no organomegaly Pelvic: deferred Extremities: extremities normal, atraumatic, no cyanosis or edema Pulses: 2+ and symmetric Skin: Skin color, texture, turgor normal. No rashes or lesions Lymph nodes: Cervical, supraclavicular, and axillary nodes normal. Neurologic: Alert and oriented X 3, normal strength and tone. Normal symmetric reflexes. Normal coordination and gait Psych--dx with bipoloar with psych   Assessment:    Healthy female exam.     Plan:     ghm utd Check labs See After Visit Summary for Counseling Recommendations    1. Preventative health care  - Basic metabolic panel - CBC with Differential - Hepatic function panel - Lipid panel - Microalbumin / creatinine urine ratio - POCT urine pregnancy - POCT urinalysis dipstick - Urine cytology ancillary only

## 2014-08-02 NOTE — Assessment & Plan Note (Signed)
Per psych Pt doing well with seroquel

## 2014-08-02 NOTE — Patient Instructions (Signed)
Preventive Care for Adults A healthy lifestyle and preventive care can promote health and wellness. Preventive health guidelines for women include the following key practices.  A routine yearly physical is a good way to check with your health care provider about your health and preventive screening. It is a chance to share any concerns and updates on your health and to receive a thorough exam.  Visit your dentist for a routine exam and preventive care every 6 months. Brush your teeth twice a day and floss once a day. Good oral hygiene prevents tooth decay and gum disease.  The frequency of eye exams is based on your age, health, family medical history, use of contact lenses, and other factors. Follow your health care provider's recommendations for frequency of eye exams.  Eat a healthy diet. Foods like vegetables, fruits, whole grains, low-fat dairy products, and lean protein foods contain the nutrients you need without too many calories. Decrease your intake of foods high in solid fats, added sugars, and salt. Eat the right amount of calories for you.Get information about a proper diet from your health care provider, if necessary.  Regular physical exercise is one of the most important things you can do for your health. Most adults should get at least 150 minutes of moderate-intensity exercise (any activity that increases your heart rate and causes you to sweat) each week. In addition, most adults need muscle-strengthening exercises on 2 or more days a week.  Maintain a healthy weight. The body mass index (BMI) is a screening tool to identify possible weight problems. It provides an estimate of body fat based on height and weight. Your health care provider can find your BMI and can help you achieve or maintain a healthy weight.For adults 20 years and older:  A BMI below 18.5 is considered underweight.  A BMI of 18.5 to 24.9 is normal.  A BMI of 25 to 29.9 is considered overweight.  A BMI of  30 and above is considered obese.  Maintain normal blood lipids and cholesterol levels by exercising and minimizing your intake of saturated fat. Eat a balanced diet with plenty of fruit and vegetables. Blood tests for lipids and cholesterol should begin at age 76 and be repeated every 5 years. If your lipid or cholesterol levels are high, you are over 50, or you are at high risk for heart disease, you may need your cholesterol levels checked more frequently.Ongoing high lipid and cholesterol levels should be treated with medicines if diet and exercise are not working.  If you smoke, find out from your health care provider how to quit. If you do not use tobacco, do not start.  Lung cancer screening is recommended for adults aged 22-80 years who are at high risk for developing lung cancer because of a history of smoking. A yearly low-dose CT scan of the lungs is recommended for people who have at least a 30-pack-year history of smoking and are a current smoker or have quit within the past 15 years. A pack year of smoking is smoking an average of 1 pack of cigarettes a day for 1 year (for example: 1 pack a day for 30 years or 2 packs a day for 15 years). Yearly screening should continue until the smoker has stopped smoking for at least 15 years. Yearly screening should be stopped for people who develop a health problem that would prevent them from having lung cancer treatment.  If you are pregnant, do not drink alcohol. If you are breastfeeding,  be very cautious about drinking alcohol. If you are not pregnant and choose to drink alcohol, do not have more than 1 drink per day. One drink is considered to be 12 ounces (355 mL) of beer, 5 ounces (148 mL) of wine, or 1.5 ounces (44 mL) of liquor.  Avoid use of street drugs. Do not share needles with anyone. Ask for help if you need support or instructions about stopping the use of drugs.  High blood pressure causes heart disease and increases the risk of  stroke. Your blood pressure should be checked at least every 1 to 2 years. Ongoing high blood pressure should be treated with medicines if weight loss and exercise do not work.  If you are 75-52 years old, ask your health care provider if you should take aspirin to prevent strokes.  Diabetes screening involves taking a blood sample to check your fasting blood sugar level. This should be done once every 3 years, after age 15, if you are within normal weight and without risk factors for diabetes. Testing should be considered at a younger age or be carried out more frequently if you are overweight and have at least 1 risk factor for diabetes.  Breast cancer screening is essential preventive care for women. You should practice "breast self-awareness." This means understanding the normal appearance and feel of your breasts and may include breast self-examination. Any changes detected, no matter how small, should be reported to a health care provider. Women in their 58s and 30s should have a clinical breast exam (CBE) by a health care provider as part of a regular health exam every 1 to 3 years. After age 16, women should have a CBE every year. Starting at age 53, women should consider having a mammogram (breast X-ray test) every year. Women who have a family history of breast cancer should talk to their health care provider about genetic screening. Women at a high risk of breast cancer should talk to their health care providers about having an MRI and a mammogram every year.  Breast cancer gene (BRCA)-related cancer risk assessment is recommended for women who have family members with BRCA-related cancers. BRCA-related cancers include breast, ovarian, tubal, and peritoneal cancers. Having family members with these cancers may be associated with an increased risk for harmful changes (mutations) in the breast cancer genes BRCA1 and BRCA2. Results of the assessment will determine the need for genetic counseling and  BRCA1 and BRCA2 testing.  Routine pelvic exams to screen for cancer are no longer recommended for nonpregnant women who are considered low risk for cancer of the pelvic organs (ovaries, uterus, and vagina) and who do not have symptoms. Ask your health care provider if a screening pelvic exam is right for you.  If you have had past treatment for cervical cancer or a condition that could lead to cancer, you need Pap tests and screening for cancer for at least 20 years after your treatment. If Pap tests have been discontinued, your risk factors (such as having a new sexual partner) need to be reassessed to determine if screening should be resumed. Some women have medical problems that increase the chance of getting cervical cancer. In these cases, your health care provider may recommend more frequent screening and Pap tests.  The HPV test is an additional test that may be used for cervical cancer screening. The HPV test looks for the virus that can cause the cell changes on the cervix. The cells collected during the Pap test can be  tested for HPV. The HPV test could be used to screen women aged 30 years and older, and should be used in women of any age who have unclear Pap test results. After the age of 30, women should have HPV testing at the same frequency as a Pap test.  Colorectal cancer can be detected and often prevented. Most routine colorectal cancer screening begins at the age of 50 years and continues through age 75 years. However, your health care provider may recommend screening at an earlier age if you have risk factors for colon cancer. On a yearly basis, your health care provider may provide home test kits to check for hidden blood in the stool. Use of a small camera at the end of a tube, to directly examine the colon (sigmoidoscopy or colonoscopy), can detect the earliest forms of colorectal cancer. Talk to your health care provider about this at age 50, when routine screening begins. Direct  exam of the colon should be repeated every 5-10 years through age 75 years, unless early forms of pre-cancerous polyps or small growths are found.  People who are at an increased risk for hepatitis B should be screened for this virus. You are considered at high risk for hepatitis B if:  You were born in a country where hepatitis B occurs often. Talk with your health care provider about which countries are considered high risk.  Your parents were born in a high-risk country and you have not received a shot to protect against hepatitis B (hepatitis B vaccine).  You have HIV or AIDS.  You use needles to inject street drugs.  You live with, or have sex with, someone who has hepatitis B.  You get hemodialysis treatment.  You take certain medicines for conditions like cancer, organ transplantation, and autoimmune conditions.  Hepatitis C blood testing is recommended for all people born from 1945 through 1965 and any individual with known risks for hepatitis C.  Practice safe sex. Use condoms and avoid high-risk sexual practices to reduce the spread of sexually transmitted infections (STIs). STIs include gonorrhea, chlamydia, syphilis, trichomonas, herpes, HPV, and human immunodeficiency virus (HIV). Herpes, HIV, and HPV are viral illnesses that have no cure. They can result in disability, cancer, and death.  You should be screened for sexually transmitted illnesses (STIs) including gonorrhea and chlamydia if:  You are sexually active and are younger than 24 years.  You are older than 24 years and your health care provider tells you that you are at risk for this type of infection.  Your sexual activity has changed since you were last screened and you are at an increased risk for chlamydia or gonorrhea. Ask your health care provider if you are at risk.  If you are at risk of being infected with HIV, it is recommended that you take a prescription medicine daily to prevent HIV infection. This is  called preexposure prophylaxis (PrEP). You are considered at risk if:  You are a heterosexual woman, are sexually active, and are at increased risk for HIV infection.  You take drugs by injection.  You are sexually active with a partner who has HIV.  Talk with your health care provider about whether you are at high risk of being infected with HIV. If you choose to begin PrEP, you should first be tested for HIV. You should then be tested every 3 months for as long as you are taking PrEP.  Osteoporosis is a disease in which the bones lose minerals and strength   with aging. This can result in serious bone fractures or breaks. The risk of osteoporosis can be identified using a bone density scan. Women ages 65 years and over and women at risk for fractures or osteoporosis should discuss screening with their health care providers. Ask your health care provider whether you should take a calcium supplement or vitamin D to reduce the rate of osteoporosis.  Menopause can be associated with physical symptoms and risks. Hormone replacement therapy is available to decrease symptoms and risks. You should talk to your health care provider about whether hormone replacement therapy is right for you.  Use sunscreen. Apply sunscreen liberally and repeatedly throughout the day. You should seek shade when your shadow is shorter than you. Protect yourself by wearing long sleeves, pants, a wide-brimmed hat, and sunglasses year round, whenever you are outdoors.  Once a month, do a whole body skin exam, using a mirror to look at the skin on your back. Tell your health care provider of new moles, moles that have irregular borders, moles that are larger than a pencil eraser, or moles that have changed in shape or color.  Stay current with required vaccines (immunizations).  Influenza vaccine. All adults should be immunized every year.  Tetanus, diphtheria, and acellular pertussis (Td, Tdap) vaccine. Pregnant women should  receive 1 dose of Tdap vaccine during each pregnancy. The dose should be obtained regardless of the length of time since the last dose. Immunization is preferred during the 27th-36th week of gestation. An adult who has not previously received Tdap or who does not know her vaccine status should receive 1 dose of Tdap. This initial dose should be followed by tetanus and diphtheria toxoids (Td) booster doses every 10 years. Adults with an unknown or incomplete history of completing a 3-dose immunization series with Td-containing vaccines should begin or complete a primary immunization series including a Tdap dose. Adults should receive a Td booster every 10 years.  Varicella vaccine. An adult without evidence of immunity to varicella should receive 2 doses or a second dose if she has previously received 1 dose. Pregnant females who do not have evidence of immunity should receive the first dose after pregnancy. This first dose should be obtained before leaving the health care facility. The second dose should be obtained 4-8 weeks after the first dose.  Human papillomavirus (HPV) vaccine. Females aged 13-26 years who have not received the vaccine previously should obtain the 3-dose series. The vaccine is not recommended for use in pregnant females. However, pregnancy testing is not needed before receiving a dose. If a female is found to be pregnant after receiving a dose, no treatment is needed. In that case, the remaining doses should be delayed until after the pregnancy. Immunization is recommended for any person with an immunocompromised condition through the age of 26 years if she did not get any or all doses earlier. During the 3-dose series, the second dose should be obtained 4-8 weeks after the first dose. The third dose should be obtained 24 weeks after the first dose and 16 weeks after the second dose.  Zoster vaccine. One dose is recommended for adults aged 60 years or older unless certain conditions are  present.  Measles, mumps, and rubella (MMR) vaccine. Adults born before 1957 generally are considered immune to measles and mumps. Adults born in 1957 or later should have 1 or more doses of MMR vaccine unless there is a contraindication to the vaccine or there is laboratory evidence of immunity to   each of the three diseases. A routine second dose of MMR vaccine should be obtained at least 28 days after the first dose for students attending postsecondary schools, health care workers, or international travelers. People who received inactivated measles vaccine or an unknown type of measles vaccine during 1963-1967 should receive 2 doses of MMR vaccine. People who received inactivated mumps vaccine or an unknown type of mumps vaccine before 1979 and are at high risk for mumps infection should consider immunization with 2 doses of MMR vaccine. For females of childbearing age, rubella immunity should be determined. If there is no evidence of immunity, females who are not pregnant should be vaccinated. If there is no evidence of immunity, females who are pregnant should delay immunization until after pregnancy. Unvaccinated health care workers born before 1957 who lack laboratory evidence of measles, mumps, or rubella immunity or laboratory confirmation of disease should consider measles and mumps immunization with 2 doses of MMR vaccine or rubella immunization with 1 dose of MMR vaccine.  Pneumococcal 13-valent conjugate (PCV13) vaccine. When indicated, a person who is uncertain of her immunization history and has no record of immunization should receive the PCV13 vaccine. An adult aged 19 years or older who has certain medical conditions and has not been previously immunized should receive 1 dose of PCV13 vaccine. This PCV13 should be followed with a dose of pneumococcal polysaccharide (PPSV23) vaccine. The PPSV23 vaccine dose should be obtained at least 8 weeks after the dose of PCV13 vaccine. An adult aged 19  years or older who has certain medical conditions and previously received 1 or more doses of PPSV23 vaccine should receive 1 dose of PCV13. The PCV13 vaccine dose should be obtained 1 or more years after the last PPSV23 vaccine dose.  Pneumococcal polysaccharide (PPSV23) vaccine. When PCV13 is also indicated, PCV13 should be obtained first. All adults aged 65 years and older should be immunized. An adult younger than age 65 years who has certain medical conditions should be immunized. Any person who resides in a nursing home or long-term care facility should be immunized. An adult smoker should be immunized. People with an immunocompromised condition and certain other conditions should receive both PCV13 and PPSV23 vaccines. People with human immunodeficiency virus (HIV) infection should be immunized as soon as possible after diagnosis. Immunization during chemotherapy or radiation therapy should be avoided. Routine use of PPSV23 vaccine is not recommended for American Indians, [place] Natives, or people younger than 65 years unless there are medical conditions that require PPSV23 vaccine. When indicated, people who have unknown immunization and have no record of immunization should receive PPSV23 vaccine. One-time revaccination 5 years after the first dose of PPSV23 is recommended for people aged 19-64 years who have chronic kidney failure, nephrotic syndrome, asplenia, or immunocompromised conditions. People who received 1-2 doses of PPSV23 before age 65 years should receive another dose of PPSV23 vaccine at age 65 years or later if at least 5 years have passed since the previous dose. Doses of PPSV23 are not needed for people immunized with PPSV23 at or after age 65 years.  Meningococcal vaccine. Adults with asplenia or persistent complement component deficiencies should receive 2 doses of quadrivalent meningococcal conjugate (MenACWY-D) vaccine. The doses should be obtained at least 2 months apart.  Microbiologists working with certain meningococcal bacteria, military recruits, people at risk during an outbreak, and people who travel to or live in countries with a high rate of meningitis should be immunized. A first-year college student up through age   21 years who is living in a residence hall should receive a dose if she did not receive a dose on or after her 16th birthday. Adults who have certain high-risk conditions should receive one or more doses of vaccine.  Hepatitis A vaccine. Adults who wish to be protected from this disease, have certain high-risk conditions, work with hepatitis A-infected animals, work in hepatitis A research labs, or travel to or work in countries with a high rate of hepatitis A should be immunized. Adults who were previously unvaccinated and who anticipate close contact with an international adoptee during the first 60 days after arrival in the [place] from a country with a high rate of hepatitis A should be immunized.  Hepatitis B vaccine. Adults who wish to be protected from this disease, have certain high-risk conditions, may be exposed to blood or other infectious body fluids, are household contacts or sex partners of hepatitis B positive people, are clients or workers in certain care facilities, or travel to or work in countries with a high rate of hepatitis B should be immunized.  Haemophilus influenzae type b (Hib) vaccine. A previously unvaccinated person with asplenia or sickle cell disease or having a scheduled splenectomy should receive 1 dose of Hib vaccine. Regardless of previous immunization, a recipient of a hematopoietic stem cell transplant should receive a 3-dose series 6-12 months after her successful transplant. Hib vaccine is not recommended for adults with HIV infection. Preventive Services / Frequency Ages 64 to 68 years  Blood pressure check.** / Every 1 to 2 years.  Lipid and cholesterol check.** / Every 5 years beginning at age  22.  Clinical breast exam.** / Every 3 years for women in their 88s and 53s.  BRCA-related cancer risk assessment.** / For women who have family members with a BRCA-related cancer (breast, ovarian, tubal, or peritoneal cancers).  Pap test.** / Every 2 years from ages 90 through 51. Every 3 years starting at age 21 through age 56 or 3 with a history of 3 consecutive normal Pap tests.  HPV screening.** / Every 3 years from ages 24 through ages 1 to 46 with a history of 3 consecutive normal Pap tests.  Hepatitis C blood test.** / For any individual with known risks for hepatitis C.  Skin self-exam. / Monthly.  Influenza vaccine. / Every year.  Tetanus, diphtheria, and acellular pertussis (Tdap, Td) vaccine.** / Consult your health care provider. Pregnant women should receive 1 dose of Tdap vaccine during each pregnancy. 1 dose of Td every 10 years.  Varicella vaccine.** / Consult your health care provider. Pregnant females who do not have evidence of immunity should receive the first dose after pregnancy.  HPV vaccine. / 3 doses over 6 months, if 72 and younger. The vaccine is not recommended for use in pregnant females. However, pregnancy testing is not needed before receiving a dose.  Measles, mumps, rubella (MMR) vaccine.** / You need at least 1 dose of MMR if you were born in 1957 or later. You may also need a 2nd dose. For females of childbearing age, rubella immunity should be determined. If there is no evidence of immunity, females who are not pregnant should be vaccinated. If there is no evidence of immunity, females who are pregnant should delay immunization until after pregnancy.  Pneumococcal 13-valent conjugate (PCV13) vaccine.** / Consult your health care provider.  Pneumococcal polysaccharide (PPSV23) vaccine.** / 1 to 2 doses if you smoke cigarettes or if you have certain conditions.  Meningococcal vaccine.** /  1 dose if you are age 19 to 21 years and a first-year college  student living in a residence hall, or have one of several medical conditions, you need to get vaccinated against meningococcal disease. You may also need additional booster doses.  Hepatitis A vaccine.** / Consult your health care provider.  Hepatitis B vaccine.** / Consult your health care provider.  Haemophilus influenzae type b (Hib) vaccine.** / Consult your health care provider. Ages 40 to 64 years  Blood pressure check.** / Every 1 to 2 years.  Lipid and cholesterol check.** / Every 5 years beginning at age 20 years.  Lung cancer screening. / Every year if you are aged 55-80 years and have a 30-pack-year history of smoking and currently smoke or have quit within the past 15 years. Yearly screening is stopped once you have quit smoking for at least 15 years or develop a health problem that would prevent you from having lung cancer treatment.  Clinical breast exam.** / Every year after age 40 years.  BRCA-related cancer risk assessment.** / For women who have family members with a BRCA-related cancer (breast, ovarian, tubal, or peritoneal cancers).  Mammogram.** / Every year beginning at age 40 years and continuing for as long as you are in good health. Consult with your health care provider.  Pap test.** / Every 3 years starting at age 30 years through age 65 or 70 years with a history of 3 consecutive normal Pap tests.  HPV screening.** / Every 3 years from ages 30 years through ages 65 to 70 years with a history of 3 consecutive normal Pap tests.  Fecal occult blood test (FOBT) of stool. / Every year beginning at age 50 years and continuing until age 75 years. You may not need to do this test if you get a colonoscopy every 10 years.  Flexible sigmoidoscopy or colonoscopy.** / Every 5 years for a flexible sigmoidoscopy or every 10 years for a colonoscopy beginning at age 50 years and continuing until age 75 years.  Hepatitis C blood test.** / For all people born from 1945 through  1965 and any individual with known risks for hepatitis C.  Skin self-exam. / Monthly.  Influenza vaccine. / Every year.  Tetanus, diphtheria, and acellular pertussis (Tdap/Td) vaccine.** / Consult your health care provider. Pregnant women should receive 1 dose of Tdap vaccine during each pregnancy. 1 dose of Td every 10 years.  Varicella vaccine.** / Consult your health care provider. Pregnant females who do not have evidence of immunity should receive the first dose after pregnancy.  Zoster vaccine.** / 1 dose for adults aged 60 years or older.  Measles, mumps, rubella (MMR) vaccine.** / You need at least 1 dose of MMR if you were born in 1957 or later. You may also need a 2nd dose. For females of childbearing age, rubella immunity should be determined. If there is no evidence of immunity, females who are not pregnant should be vaccinated. If there is no evidence of immunity, females who are pregnant should delay immunization until after pregnancy.  Pneumococcal 13-valent conjugate (PCV13) vaccine.** / Consult your health care provider.  Pneumococcal polysaccharide (PPSV23) vaccine.** / 1 to 2 doses if you smoke cigarettes or if you have certain conditions.  Meningococcal vaccine.** / Consult your health care provider.  Hepatitis A vaccine.** / Consult your health care provider.  Hepatitis B vaccine.** / Consult your health care provider.  Haemophilus influenzae type b (Hib) vaccine.** / Consult your health care provider. Ages 65   years and over  Blood pressure check.** / Every 1 to 2 years.  Lipid and cholesterol check.** / Every 5 years beginning at age 22 years.  Lung cancer screening. / Every year if you are aged 73-80 years and have a 30-pack-year history of smoking and currently smoke or have quit within the past 15 years. Yearly screening is stopped once you have quit smoking for at least 15 years or develop a health problem that would prevent you from having lung cancer  treatment.  Clinical breast exam.** / Every year after age 4 years.  BRCA-related cancer risk assessment.** / For women who have family members with a BRCA-related cancer (breast, ovarian, tubal, or peritoneal cancers).  Mammogram.** / Every year beginning at age 40 years and continuing for as long as you are in good health. Consult with your health care provider.  Pap test.** / Every 3 years starting at age 9 years through age 34 or 91 years with 3 consecutive normal Pap tests. Testing can be stopped between 65 and 70 years with 3 consecutive normal Pap tests and no abnormal Pap or HPV tests in the past 10 years.  HPV screening.** / Every 3 years from ages 57 years through ages 64 or 45 years with a history of 3 consecutive normal Pap tests. Testing can be stopped between 65 and 70 years with 3 consecutive normal Pap tests and no abnormal Pap or HPV tests in the past 10 years.  Fecal occult blood test (FOBT) of stool. / Every year beginning at age 15 years and continuing until age 17 years. You may not need to do this test if you get a colonoscopy every 10 years.  Flexible sigmoidoscopy or colonoscopy.** / Every 5 years for a flexible sigmoidoscopy or every 10 years for a colonoscopy beginning at age 86 years and continuing until age 71 years.  Hepatitis C blood test.** / For all people born from [birth year] through 1967 and any individual with known risks for hepatitis C.  Osteoporosis screening.** / A one-time screening for women ages 83 years and over and women at risk for fractures or osteoporosis.  Skin self-exam. / Monthly.  Influenza vaccine. / Every year.  Tetanus, diphtheria, and acellular pertussis (Tdap/Td) vaccine.** / 1 dose of Td every 10 years.  Varicella vaccine.** / Consult your health care provider.  Zoster vaccine.** / 1 dose for adults aged 61 years or older.  Pneumococcal 13-valent conjugate (PCV13) vaccine.** / Consult your health care provider.  Pneumococcal  polysaccharide (PPSV23) vaccine.** / 1 dose for all adults aged 28 years and older.  Meningococcal vaccine.** / Consult your health care provider.  Hepatitis A vaccine.** / Consult your health care provider.  Hepatitis B vaccine.** / Consult your health care provider.  Haemophilus influenzae type b (Hib) vaccine.** / Consult your health care provider. ** Family history and personal history of risk and conditions may change your health care provider's recommendations. Document Released: 09/25/2001 Document Revised: 12/14/2013 Document Reviewed: 12/25/2010 [facility] Patient Information 2015 [place], [place]. This information is not intended to replace advice given to you by your health care provider. Make sure you discuss any questions you have with your health care provider.

## 2014-08-02 NOTE — Assessment & Plan Note (Signed)
Per psych 

## 2014-09-28 ENCOUNTER — Telehealth: Payer: Self-pay | Admitting: Family Medicine

## 2014-09-28 DIAGNOSIS — F988 Other specified behavioral and emotional disorders with onset usually occurring in childhood and adolescence: Secondary | ICD-10-CM

## 2014-09-28 MED ORDER — AMPHETAMINE-DEXTROAMPHET ER 30 MG PO CP24: 30.0000 mg | ORAL_CAPSULE | ORAL | Status: DC

## 2014-09-28 NOTE — Telephone Encounter (Signed)
Caller name: Trula SladeRogers, Domonique Y Relation to pt: self  Call back number: [phone]   Reason for call:  Pt requesting a refill amphetamine-dextroamphetamine (ADDERALL XR) 30 MG 24 hr capsule

## 2014-09-28 NOTE — Telephone Encounter (Signed)
Patient aware Rx ready for pick up.      KP 

## 2014-11-01 ENCOUNTER — Telehealth: Payer: Self-pay | Admitting: Family Medicine

## 2014-11-01 DIAGNOSIS — F988 Other specified behavioral and emotional disorders with onset usually occurring in childhood and adolescence: Secondary | ICD-10-CM

## 2014-11-01 MED ORDER — AMPHETAMINE-DEXTROAMPHET ER 30 MG PO CP24: 30.0000 mg | ORAL_CAPSULE | ORAL | Status: DC

## 2014-11-01 NOTE — Telephone Encounter (Signed)
Patient aware Rx ready for pick up.      KP 

## 2014-11-01 NOTE — Telephone Encounter (Signed)
Caller name: Dalila Relation to pt: self Call back number:  [phone] Pharmacy:  Reason for call:   Requesting adderall rx

## 2014-12-06 ENCOUNTER — Telehealth: Payer: Self-pay | Admitting: Family Medicine

## 2014-12-06 DIAGNOSIS — F988 Other specified behavioral and emotional disorders with onset usually occurring in childhood and adolescence: Secondary | ICD-10-CM

## 2014-12-06 MED ORDER — AMPHETAMINE-DEXTROAMPHET ER 30 MG PO CP24: 30.0000 mg | ORAL_CAPSULE | ORAL | Status: DC

## 2014-12-06 NOTE — Telephone Encounter (Signed)
Relation to pt: self  Call back number: [phone]   Reason for call:   Pt requesting amphetamine-dextroamphetamine (ADDERALL XR) 30 MG 24 hr capsule

## 2014-12-06 NOTE — Telephone Encounter (Signed)
Patient aware that the Rx will be ready for pick up after lunch today.      KP

## 2014-12-07 ENCOUNTER — Telehealth: Payer: Self-pay | Admitting: Family Medicine

## 2014-12-07 NOTE — Telephone Encounter (Signed)
Ok to give not that she has add and needs extra time for exams.

## 2014-12-07 NOTE — Telephone Encounter (Signed)
Letter left at check in.      KP 

## 2014-12-07 NOTE — Telephone Encounter (Signed)
Please advise      KP 

## 2014-12-07 NOTE — Telephone Encounter (Signed)
Caller name:Picazo, Valynn Relation to WG:NFAOpt:self Call back number:[phone] Pharmacy:  Reason for call: pt states she is needing documentation that she has ADHD and takes meds for it, states she need to in order to get addition time for her exams at school, pt will be picking up her meds today and states she can get it then.

## 2015-04-27 ENCOUNTER — Ambulatory Visit: Payer: BC Managed Care – PPO | Admitting: Internal Medicine

## 2015-05-13 ENCOUNTER — Ambulatory Visit (INDEPENDENT_AMBULATORY_CARE_PROVIDER_SITE_OTHER): Payer: BC Managed Care – PPO | Admitting: Family Medicine

## 2015-05-13 ENCOUNTER — Encounter: Payer: Self-pay | Admitting: Family Medicine

## 2015-05-13 ENCOUNTER — Other Ambulatory Visit (HOSPITAL_COMMUNITY)
Admission: RE | Admit: 2015-05-13 | Discharge: 2015-05-13 | Disposition: A | Discharge status: Home / Self Care | Payer: Self-pay | Source: Ambulatory Visit | Attending: Family Medicine | Admitting: Family Medicine

## 2015-05-13 VITALS — BP 114/72 | HR 94 | Temp 99.1°F | Wt 124.6 lb

## 2015-05-13 DIAGNOSIS — J302 Other seasonal allergic rhinitis: Secondary | ICD-10-CM

## 2015-05-13 DIAGNOSIS — Z23 Encounter for immunization: Secondary | ICD-10-CM | POA: Diagnosis not present

## 2015-05-13 DIAGNOSIS — Z7251 High risk heterosexual behavior: Secondary | ICD-10-CM

## 2015-05-13 DIAGNOSIS — N760 Acute vaginitis: Secondary | ICD-10-CM | POA: Insufficient documentation

## 2015-05-13 DIAGNOSIS — Z202 Contact with and (suspected) exposure to infections with a predominantly sexual mode of transmission: Secondary | ICD-10-CM | POA: Diagnosis not present

## 2015-05-13 HISTORY — DX: Contact with and (suspected) exposure to infections with a predominantly sexual mode of transmission: Z20.2

## 2015-05-13 LAB — POCT URINALYSIS DIPSTICK
BILIRUBIN UA: NEGATIVE
Glucose, UA: NEGATIVE
Ketones, UA: NEGATIVE
LEUKOCYTES UA: NEGATIVE
Nitrite, UA: NEGATIVE
PH UA: 6
PROTEIN UA: NEGATIVE
RBC UA: NEGATIVE
Spec Grav, UA: >=1.03
UROBILINOGEN UA: NEGATIVE

## 2015-05-13 LAB — POCT URINE PREGNANCY: PREG TEST UR: NEGATIVE

## 2015-05-13 MED ORDER — FLUTICASONE PROPIONATE 50 MCG/ACT NA SUSP: 2.0000 | Freq: Every day | NASAL | Status: DC

## 2015-05-13 NOTE — Progress Notes (Signed)
Pre visit review using our clinic review tool, if applicable. No additional management support is needed unless otherwise documented below in the visit note. 

## 2015-05-13 NOTE — Patient Instructions (Signed)

## 2015-05-13 NOTE — Assessment & Plan Note (Signed)
Check std Check labs

## 2015-05-13 NOTE — Progress Notes (Signed)
Patient ID: KEYETTA HOLLINGWORTH, female    DOB: 03-May-1996  Age: 19 y.o. MRN: [id number]    Subjective:  Subjective HPI LARRISSA STIVERS presents for std check.   She had unprotected sex with her boyfriend and would like to get checked.   No abd pain.    Review of Systems  Constitutional: Negative for fever, chills, diaphoresis, activity change, appetite change, fatigue and unexpected weight change.  Eyes: Negative for pain, redness and visual disturbance.  Respiratory: Negative for cough, chest tightness, shortness of breath and wheezing.   Cardiovascular: Negative for chest pain, palpitations and leg swelling.  Gastrointestinal: Negative for abdominal pain and abdominal distention.  Endocrine: Negative for cold intolerance, heat intolerance, polydipsia, polyphagia and polyuria.  Genitourinary: Negative for dysuria, urgency, frequency, hematuria, flank pain, vaginal discharge, difficulty urinating, genital sores, vaginal pain, menstrual problem, pelvic pain and dyspareunia.  Musculoskeletal: Negative for back pain.  Neurological: Negative for dizziness, light-headedness, numbness and headaches.    History Past Medical History  Diagnosis Date  . ADHD (attention deficit hyperactivity disorder)   . Allergy   . Eczema 12/11/13  . Bipolar disorder     She has no past surgical history on file.   Her family history includes Asthma in her father and mother; COPD in her maternal grandmother; Diabetes in her maternal grandfather, paternal grandfather, and paternal grandmother; Heart disease in her father and maternal uncle; Hypertension in her father and maternal grandmother; Leukemia in her maternal uncle; Stroke in her father and paternal grandmother.She reports that she has never smoked. She has never used smokeless tobacco. She reports that she does not drink alcohol or use illicit drugs.  Current Outpatient Prescriptions on File Prior to Visit  Medication Sig Dispense Refill  . ELIDEL 1 %  cream     . mometasone (NASONEX) 50 MCG/ACT nasal spray Place 2 sprays into the nose daily as needed (allergies).     Marland Kitchen PRESCRIPTION MEDICATION Apply 1 application topically 2 (two) times daily as needed (itching). triamcinolone and  mixed eucerin 1:1    . vitamin B-12 (CYANOCOBALAMIN) 1000 MCG tablet Take 1,000 mcg by mouth daily.     No current facility-administered medications on file prior to visit.     Objective:  Objective Physical Exam  Constitutional: She is oriented to person, place, and time. She appears well-developed and well-nourished.  HENT:  Head: Normocephalic and atraumatic.  Eyes: Conjunctivae and EOM are normal.  Neck: Normal range of motion. Neck supple. No JVD present. Carotid bruit is not present. No thyromegaly present.  Cardiovascular: Normal rate, regular rhythm and normal heart sounds.   No murmur heard. Pulmonary/Chest: Effort normal and breath sounds normal. No respiratory distress. She has no wheezes. She has no rales. She exhibits no tenderness.  Genitourinary: Vagina normal. No labial fusion. There is no rash, tenderness, lesion or injury on the right labia. There is no rash, tenderness, lesion or injury on the left labia. Uterus is not deviated, not enlarged, not fixed and not tender. Cervix exhibits no motion tenderness, no discharge and no friability.  Musculoskeletal: She exhibits no edema.  Neurological: She is alert and oriented to person, place, and time.  Psychiatric: She has a normal mood and affect.   BP 114/72 mmHg  Pulse 94  Temp(Src) 99.1 F (37.3 C) (Oral)  Wt 124 lb 9.6 oz (56.518 kg)  SpO2 98%  LMP 05/09/2015 Wt Readings from Last 3 Encounters:  05/13/15 124 lb 9.6 oz (56.518 kg)  08/02/14 144 lb (  65.318 kg)  01/18/14 124 lb (56.246 kg) (42 %*, Z = -0.21)   * Growth percentiles are based on CDC 2-20 Years data.     Lab Results  Component Value Date   WBC 5.0 08/02/2014   HGB 12.7 08/02/2014   HCT 39.2 08/02/2014   PLT 251.0  08/02/2014   GLUCOSE 83 08/02/2014   CHOL 153 08/02/2014   TRIG 53.0 08/02/2014   HDL 48.30 08/02/2014   LDLCALC 94 08/02/2014   ALT 9 08/02/2014   AST 13 08/02/2014   NA 137 08/02/2014   K 3.6 08/02/2014   CL 104 08/02/2014   CREATININE 0.6 08/02/2014   BUN 11 08/02/2014   CO2 25 08/02/2014   TSH 0.58 03/16/2013   MICROALBUR 0.6 08/02/2014    No results found.   Assessment & Plan:  Plan I have discontinued Ms. Calip's fexofenadine. I am also having her maintain her mometasone, vitamin B-12, PRESCRIPTION MEDICATION, ELIDEL, amphetamine-dextroamphetamine, SEROQUEL XR, Vitamin D (Ergocalciferol), and chlorpheniramine.  Meds ordered this encounter  Medications  . amphetamine-dextroamphetamine (ADDERALL XR) 20 MG 24 hr capsule    Sig: Take 20 mg by mouth daily.    Refill:  0  . SEROQUEL XR 200 MG 24 hr tablet    Sig: Take 200 mg by mouth at bedtime.    Refill:  5  . Vitamin D, Ergocalciferol, (DRISDOL) 50000 UNITS CAPS capsule    Sig: Take 50,000 Units by mouth once a week.    Refill:  12  . chlorpheniramine (CHLOR-TRIMETON) 4 MG tablet    Sig: Take 4 mg by mouth 2 (two) times daily as needed for allergies.    Problem List Items Addressed This Visit    STD exposure    Check std Check labs       Relevant Orders   HIV antibody   HSV 2 antibody, IgG   RPR    Other Visit Diagnoses    High risk sexual behavior    -  Primary    Relevant Orders    GC/Chlamydia Probe Amp    Urine cytology ancillary only    POCT Urinalysis Dipstick    POCT urine pregnancy       Follow-up: Return if symptoms worsen or fail to improve.  Loreen Freud, DO     0.

## 2015-05-14 LAB — GC/CHLAMYDIA PROBE AMP
CT PROBE, AMP APTIMA: POSITIVE — AB
GC Probe RNA: NEGATIVE

## 2015-05-14 LAB — HIV ANTIBODY (ROUTINE TESTING W REFLEX): HIV: NONREACTIVE

## 2015-05-14 LAB — RPR

## 2015-05-16 LAB — HSV 2 ANTIBODY, IGG: HSV 2 Glycoprotein G Ab, IgG: <0.1 IV

## 2015-05-17 LAB — URINE CYTOLOGY ANCILLARY ONLY: TRICH (WINDOWPATH): NEGATIVE

## 2015-05-18 ENCOUNTER — Other Ambulatory Visit: Payer: Self-pay

## 2015-05-18 MED ORDER — AZITHROMYCIN 250 MG PO TABS: ORAL_TABLET | ORAL | Status: DC

## 2015-05-19 LAB — URINE CYTOLOGY ANCILLARY ONLY
BACTERIAL VAGINITIS: NEGATIVE
CANDIDA VAGINITIS: NEGATIVE

## 2016-01-02 ENCOUNTER — Encounter: Payer: Self-pay | Admitting: Family Medicine

## 2016-01-02 ENCOUNTER — Ambulatory Visit (INDEPENDENT_AMBULATORY_CARE_PROVIDER_SITE_OTHER): Payer: BC Managed Care – PPO | Admitting: Family Medicine

## 2016-01-02 ENCOUNTER — Other Ambulatory Visit (HOSPITAL_COMMUNITY)
Admission: RE | Admit: 2016-01-02 | Discharge: 2016-01-02 | Disposition: A | Discharge status: Home / Self Care | Payer: Self-pay | Source: Ambulatory Visit | Attending: Family Medicine | Admitting: Family Medicine

## 2016-01-02 VITALS — BP 103/65 | HR 78 | Temp 98.3°F | Ht 64.0 in | Wt 134.6 lb

## 2016-01-02 DIAGNOSIS — N898 Other specified noninflammatory disorders of vagina: Secondary | ICD-10-CM | POA: Diagnosis not present

## 2016-01-02 DIAGNOSIS — N760 Acute vaginitis: Secondary | ICD-10-CM | POA: Insufficient documentation

## 2016-01-02 LAB — POCT URINALYSIS DIPSTICK
BILIRUBIN UA: NEGATIVE
GLUCOSE UA: NEGATIVE
Ketones, UA: NEGATIVE
Leukocytes, UA: NEGATIVE
NITRITE UA: NEGATIVE
Protein, UA: NEGATIVE
RBC UA: NEGATIVE
Spec Grav, UA: >=1.03
Urobilinogen, UA: 0.2
pH, UA: 6

## 2016-01-02 LAB — POCT URINE PREGNANCY: Preg Test, Ur: NEGATIVE

## 2016-01-02 MED ORDER — FLUCONAZOLE 150 MG PO TABS: 150.0000 mg | ORAL_TABLET | Freq: Once | ORAL | Status: DC

## 2016-01-02 NOTE — Progress Notes (Signed)
Pre visit review using our clinic review tool, if applicable. No additional management support is needed unless otherwise documented below in the visit note. 

## 2016-01-02 NOTE — Patient Instructions (Signed)

## 2016-01-02 NOTE — Progress Notes (Signed)
Patient ID: [name], female    DOB: Aug 26, 1995  Age: 20 y.o. MRN: [id number]    Subjective:  Subjective HPI [name] presents for vaginal d/c, white, thick, no odor.  No abd pain.  Pt is also asking for all STD to be checked but denies being sexually active.     Review of Systems  Constitutional: Negative for diaphoresis, appetite change, fatigue and unexpected weight change.  Eyes: Negative for pain, redness and visual disturbance.  Respiratory: Negative for cough, chest tightness, shortness of breath and wheezing.   Cardiovascular: Negative for chest pain, palpitations and leg swelling.  Endocrine: Negative for cold intolerance, heat intolerance, polydipsia, polyphagia and polyuria.  Genitourinary: Positive for vaginal discharge. Negative for dysuria, frequency, difficulty urinating, menstrual problem and pelvic pain.  Neurological: Negative for dizziness, light-headedness, numbness and headaches.    History Past Medical History  Diagnosis Date  . ADHD (attention deficit hyperactivity disorder)   . Allergy   . Eczema 12/11/13  . Bipolar disorder (HCC)     She has no past surgical history on file.   Her family history includes Asthma in her father and mother; COPD in her maternal grandmother; Diabetes in her maternal grandfather, paternal grandfather, and paternal grandmother; Heart disease in her father and maternal uncle; Hypertension in her father and maternal grandmother; Leukemia in her maternal uncle; Stroke in her father and paternal grandmother.She reports that she has never smoked. She has never used smokeless tobacco. She reports that she does not drink alcohol or use illicit drugs.  Current Outpatient Prescriptions on File Prior to Visit  Medication Sig Dispense Refill  . azithromycin (ZITHROMAX) 250 MG tablet Take all 4 at one time 4 each 0  . chlorpheniramine (CHLOR-TRIMETON) 4 MG tablet Take 4 mg by mouth 2 (two) times daily as needed for allergies.      Marland Kitchen ELIDEL 1 % cream     . fluticasone (FLONASE) 50 MCG/ACT nasal spray Place 2 sprays into both nostrils daily. 16 g 6  . PRESCRIPTION MEDICATION Apply 1 application topically 2 (two) times daily as needed (itching). triamcinolone and  mixed eucerin 1:1    . SEROQUEL XR 200 MG 24 hr tablet Take 200 mg by mouth at bedtime.  5  . vitamin B-12 (CYANOCOBALAMIN) 1000 MCG tablet Take 1,000 mcg by mouth daily.    . Vitamin D, Ergocalciferol, (DRISDOL) 50000 UNITS CAPS capsule Take 50,000 Units by mouth once a week.  12   No current facility-administered medications on file prior to visit.     Objective:  Objective Physical Exam  Abdominal: Soft. She exhibits no distension. There is no tenderness. There is no rebound, no guarding and no CVA tenderness.  Genitourinary: Uterus normal. Pelvic exam was performed with patient supine. There is no rash, tenderness, lesion or injury on the right labia. There is no rash, tenderness, lesion or injury on the left labia. Cervix exhibits discharge. Cervix exhibits no motion tenderness and no friability. No erythema or tenderness in the vagina. Vaginal discharge found.  Nursing note and vitals reviewed.  BP 103/65 mmHg  Pulse 78  Temp(Src) 98.3 F (36.8 C) (Oral)  Ht  (1.626 m)  Wt 134 lb 9.6 oz (61.054 kg)  BMI 23.09 kg/m2  SpO2 98%  LMP 12/13/2015 Wt Readings from Last 3 Encounters:  01/02/16 134 lb 9.6 oz (61.054 kg)  05/13/15 124 lb 9.6 oz (56.518 kg)  08/02/14 144 lb (65.318 kg)     Lab Results  Component  Value Date   WBC 5.0 08/02/2014   HGB 12.7 08/02/2014   HCT 39.2 08/02/2014   PLT 251.0 08/02/2014   GLUCOSE 83 08/02/2014   CHOL 153 08/02/2014   TRIG 53.0 08/02/2014   HDL 48.30 08/02/2014   LDLCALC 94 08/02/2014   ALT 9 08/02/2014   AST 13 08/02/2014   NA 137 08/02/2014   K 3.6 08/02/2014   CL 104 08/02/2014   CREATININE 0.6 08/02/2014   BUN 11 08/02/2014   CO2 25 08/02/2014   TSH 0.58 03/16/2013   MICROALBUR 0.6  08/02/2014    No results found.   Assessment & Plan:  Plan I am having [name] start on fluconazole. I am also having her maintain her vitamin B-12, PRESCRIPTION MEDICATION, ELIDEL, SEROQUEL XR, Vitamin D (Ergocalciferol), chlorpheniramine, fluticasone, azithromycin, and amphetamine-dextroamphetamine.  Meds ordered this encounter  Medications  . amphetamine-dextroamphetamine (ADDERALL XR) 30 MG 24 hr capsule    Sig: Take 30 mg by mouth every morning.    Refill:  0  . fluconazole (DIFLUCAN) 150 MG tablet    Sig: Take 1 tablet (150 mg total) by mouth once.    Dispense:  1 tablet    Refill:  0    Problem List Items Addressed This Visit    None    Visit Diagnoses    Vaginal discharge    -  Primary    Relevant Medications    fluconazole (DIFLUCAN) 150 MG tablet    Other Relevant Orders    POCT urinalysis dipstick (Completed)    POCT urine pregnancy (Completed)    HIV antibody    HSV 2 antibody, IgG    RPR    GC/Chlamydia Probe Amp    Cervicovaginal ancillary only       Follow-up: Return if symptoms worsen or fail to improve.  Donato SchultzYvonne R Lowne Chase, DO

## 2016-01-03 LAB — HIV ANTIBODY (ROUTINE TESTING W REFLEX): HIV 1&2 Ab, 4th Generation: NONREACTIVE

## 2016-01-03 LAB — GC/CHLAMYDIA PROBE AMP
CT Probe RNA: NOT DETECTED
GC PROBE AMP APTIMA: NOT DETECTED

## 2016-01-03 LAB — RPR

## 2016-01-03 LAB — HSV 2 ANTIBODY, IGG

## 2016-01-04 LAB — CERVICOVAGINAL ANCILLARY ONLY: WET PREP (BD AFFIRM): NEGATIVE

## 2016-07-15 ENCOUNTER — Other Ambulatory Visit: Payer: Self-pay | Admitting: Family Medicine

## 2016-07-15 DIAGNOSIS — J302 Other seasonal allergic rhinitis: Secondary | ICD-10-CM

## 2016-07-16 NOTE — Telephone Encounter (Signed)
Rx request Denied; patient needs to contact provider's office and schedule appointment, last CPE: 08/02/14; two Acute only visits [05/13/15 & 01/02/16]/SLS 12/04  Please call patient and schedule CPE with fasting labs [F/U at minimum] prior to any refill authorizations/SLS 12/04

## 2016-07-18 NOTE — Telephone Encounter (Signed)
Left message for patient to call the office to schedule appt. Prior to refills

## 2016-08-03 ENCOUNTER — Ambulatory Visit (INDEPENDENT_AMBULATORY_CARE_PROVIDER_SITE_OTHER): Payer: BC Managed Care – PPO | Admitting: Physician Assistant

## 2016-08-03 ENCOUNTER — Other Ambulatory Visit (HOSPITAL_COMMUNITY)
Admission: RE | Admit: 2016-08-03 | Discharge: 2016-08-03 | Disposition: A | Discharge status: Home / Self Care | Payer: BC Managed Care – PPO | Source: Ambulatory Visit | Attending: Physician Assistant | Admitting: Physician Assistant

## 2016-08-03 ENCOUNTER — Encounter: Payer: Self-pay | Admitting: Physician Assistant

## 2016-08-03 VITALS — BP 126/68 | HR 96 | Temp 98.4°F | Resp 16 | Ht 64.0 in | Wt 142.2 lb

## 2016-08-03 DIAGNOSIS — Z113 Encounter for screening for infections with a predominantly sexual mode of transmission: Secondary | ICD-10-CM

## 2016-08-03 DIAGNOSIS — Z309 Encounter for contraceptive management, unspecified: Secondary | ICD-10-CM | POA: Diagnosis not present

## 2016-08-03 LAB — POCT URINE PREGNANCY: PREG TEST UR: NEGATIVE

## 2016-08-03 MED ORDER — NORGESTIMATE-ETH ESTRADIOL 0.25-35 MG-MCG PO TABS: 1.0000 | ORAL_TABLET | Freq: Every day | ORAL | 2 refills | Status: DC

## 2016-08-03 NOTE — Patient Instructions (Addendum)
Please go to the lab for your blood work before you leave. We will call you with your testing results. Use back-up protection for at least 1 week while starting birth control. You may start birth control today.   Follow up with your primary in 3 months for a physical exam.  Oral Contraception Information Oral contraceptive pills (OCPs) are medicines taken to prevent pregnancy. OCPs work by preventing the ovaries from releasing eggs. The hormones in OCPs also cause the cervical mucus to thicken, preventing the sperm from entering the uterus. The hormones also cause the uterine lining to become thin, not allowing a fertilized egg to attach to the inside of the uterus. OCPs are highly effective when taken exactly as prescribed. However, OCPs do not prevent sexually transmitted diseases (STDs). Safe sex practices, such as using condoms along with the pill, can help prevent STDs.  Before taking the pill, you may have a physical exam and Pap test. Your health care provider may order blood tests. The health care provider will make sure you are a good candidate for oral contraception. Discuss with your health care provider the possible side effects of the OCP you may be prescribed. When starting an OCP, it can take 2 to 3 months for the body to adjust to the changes in hormone levels in your body.  TYPES OF ORAL CONTRACEPTION  The combination pill-This pill contains estrogen and progestin (synthetic progesterone) hormones. The combination pill comes in 21-day, 28-day, or 91-day packs. Some types of combination pills are meant to be taken continuously (365-day pills). With 21-day packs, you do not take pills for 7 days after the last pill. With 28-day packs, the pill is taken every day. The last 7 pills are without hormones. Certain types of pills have more than 21 hormone-containing pills. With 91-day packs, the first 84 pills contain both hormones, and the last 7 pills contain no hormones or contain estrogen  only.  The minipill-This pill contains the progesterone hormone only. The pill is taken every day continuously. It is very important to take the pill at the same time each day. The minipill comes in packs of 28 pills. All 28 pills contain the hormone.  ADVANTAGES OF ORAL CONTRACEPTIVE PILLS  Decreases premenstrual symptoms.   Treats menstrual period cramps.   Regulates the menstrual cycle.   Decreases a heavy menstrual flow.   May treatacne, depending on the type of pill.   Treats abnormal uterine bleeding.   Treats polycystic ovarian syndrome.   Treats endometriosis.   Can be used as emergency contraception.  THINGS THAT CAN MAKE ORAL CONTRACEPTIVE PILLS LESS EFFECTIVE OCPs can be less effective if:   You forget to take the pill at the same time every day.   You have a stomach or intestinal disease that lessens the absorption of the pill.   You take OCPs with other medicines that make OCPs less effective, such as antibiotics, certain HIV medicines, and some seizure medicines.   You take expired OCPs.   You forget to restart the pill on day 7, when using the packs of 21 pills.  RISKS ASSOCIATED WITH ORAL CONTRACEPTIVE PILLS  Oral contraceptive pills can sometimes cause side effects, such as:  Headache.  Nausea.  Breast tenderness.  Irregular bleeding or spotting. Combination pills are also associated with a small increased risk of:  Blood clots.  Heart attack.  Stroke. This information is not intended to replace advice given to you by your health care provider. Make sure you  discuss any questions you have with your health care provider. Document Released: 10/20/2002 Document Revised: 11/21/2015 Document Reviewed: 01/18/2013 [employer] Interactive Patient Education  2017 [employer]

## 2016-08-03 NOTE — Progress Notes (Signed)
Pre visit review using our clinic review tool, if applicable. No additional management support is needed unless otherwise documented below in the visit note. 

## 2016-08-03 NOTE — Progress Notes (Addendum)
Subjective:    Patient ID: Jaclyn SladeShekinah Y [name], female    DOB: 04/12/1996, 20 y.o.   MRN: [id number]  HPI  Patient had protected sex on Tuesday, period started today, last month's period was 11/17-11/20. This was a new female partner for her. She also reports that she had sex about a week and a half ago, also protected, with a different female partner. She states that neither of her female partners are endorsing any symptoms or concerns of STIs. She denies fevers, chills, poor appetite, abdominal pain, unusual vaginal discharge. She reports a previous history of chlamydia.  She is interested in starting birth control. She has never been on birth control. Her sister is on Sprintec and she is interested in trying that. She is not a smoker, denies any personal or family history of blood clots.  Review of Systems  See HPI.  Past Medical History:  Diagnosis Date  . ADHD (attention deficit hyperactivity disorder)   . Allergy   . Bipolar disorder (HCC)   . Eczema 12/11/13     Social History   Social History  . Marital status: Single    Spouse name: N/A  . Number of children: N/A  . Years of education: N/A   Occupational History  . Not on file.   Social History Main Topics  . Smoking status: Never Smoker  . Smokeless tobacco: Never Used  . Alcohol use No  . Drug use: No  . Sexual activity: Not on file   Other Topics Concern  . Not on file   Social History Narrative  . No narrative on file    No past surgical history on file.  Family History  Problem Relation Age of Onset  . Asthma Mother   . Stroke Father   . Heart disease Father   . Asthma Father   . Hypertension Father     pulmonary hypertension  . Heart disease Maternal Uncle   . Hypertension Maternal Grandmother   . COPD Maternal Grandmother   . Diabetes Maternal Grandfather   . Stroke Paternal Grandmother   . Diabetes Paternal Grandmother   . Diabetes Paternal Grandfather   . Leukemia Maternal Uncle      Allergies  Allergen Reactions  . Concerta [Methylphenidate] Other (See Comments)    hallucinations  . Clindamycin Hcl Rash  . Terbinafine Hcl Nausea And Vomiting    Current Outpatient Prescriptions on File Prior to Visit  Medication Sig Dispense Refill  . amphetamine-dextroamphetamine (ADDERALL XR) 30 MG 24 hr capsule Take 30 mg by mouth every morning.  0  . chlorpheniramine (CHLOR-TRIMETON) 4 MG tablet Take 4 mg by mouth 2 (two) times daily as needed for allergies.    Marland Kitchen. ELIDEL 1 % cream     . fluticasone (FLONASE) 50 MCG/ACT nasal spray Place 2 sprays into both nostrils daily. 16 g 6  . PRESCRIPTION MEDICATION Apply 1 application topically 2 (two) times daily as needed (itching). triamcinolone and  mixed eucerin 1:1    . SEROQUEL XR 200 MG 24 hr tablet Take 200 mg by mouth at bedtime.  5  . Vitamin D, Ergocalciferol, (DRISDOL) 50000 UNITS CAPS capsule Take 50,000 Units by mouth once a week.  12   No current facility-administered medications on file prior to visit.     BP 126/68 (BP Location: Right Arm, Cuff Size: Normal)   Pulse 96   Temp 98.4 F (36.9 C) (Oral)   Resp 16   Ht 5\' 4"  (1.626 m)  Wt 142 lb 3.2 oz (64.5 kg)   LMP 08/03/2016   SpO2 97%   BMI 24.41 kg/m        Objective:   Physical Exam  Constitutional: Vital signs are normal. She appears well-developed and well-nourished.  HENT:  Head: Normocephalic and atraumatic.  Eyes: Conjunctivae are normal.  Cardiovascular: Normal rate, regular rhythm and normal heart sounds.   Pulmonary/Chest: Effort normal and breath sounds normal. No tachypnea and no bradypnea.  Genitourinary: Vagina normal. There is no rash, tenderness or lesion on the right labia. There is no rash, tenderness or lesion on the left labia. Cervix exhibits discharge (active bleeding, current on menses). Cervix exhibits no motion tenderness and no friability. No erythema in the vagina. No signs of injury around the vagina.  Nursing note and  vitals reviewed.   Results for orders placed or performed in visit on 08/03/16  POCT urine pregnancy  Result Value Ref Range   Preg Test, Ur Negative Negative       Assessment & Plan:  STD screening - urine pregnancy test negative. Cervical swabs taken for gonorrhea/chlamydia and trichomonas today. Labs done for RPR, HIV, HSV. Will call with results.  Oral contraceptive - start birth control today. Sprintec per orders. Advised back-up protection for at least 1 week while starting birth control. Discussed side effects of starting birth control. Encouraged patient to follow-up with PCP if any concerning or unusual side effects. Recommended follow-up in 593-months for routine physical exam.

## 2016-08-04 LAB — HIV ANTIBODY (ROUTINE TESTING W REFLEX): HIV: NONREACTIVE

## 2016-08-04 LAB — RPR

## 2016-08-07 ENCOUNTER — Telehealth: Payer: Self-pay

## 2016-08-07 ENCOUNTER — Telehealth: Payer: Self-pay | Admitting: Family Medicine

## 2016-08-07 DIAGNOSIS — Z7251 High risk heterosexual behavior: Secondary | ICD-10-CM

## 2016-08-07 LAB — CERVICOVAGINAL ANCILLARY ONLY
Chlamydia: NEGATIVE
NEISSERIA GONORRHEA: NEGATIVE

## 2016-08-07 LAB — HSV 2 ANTIBODY, IGG

## 2016-08-07 LAB — HSV 1 ANTIBODY, IGG

## 2016-08-07 NOTE — Telephone Encounter (Signed)
lab was ordered by Jarold MottoSamantha Worley, [place]. [facility] called and need of a new order because the order put in for HSV 1 code was invalid. [facility] rep suggest we use HSV 1 & 2 ab -IgG. Entered correct order. LB

## 2016-08-07 NOTE — Telephone Encounter (Signed)
Forwarding to lab

## 2016-08-07 NOTE — Telephone Encounter (Signed)
[facility] called stating that there is a problem with the patients lab sample. They would like a call back. Please advise.    Reference # - [id number]  [phone] opt. 2  *lab was ordered by Jarold MottoSamantha Worley*

## 2016-08-08 LAB — CERVICOVAGINAL ANCILLARY ONLY: WET PREP (BD AFFIRM): NEGATIVE

## 2016-08-08 NOTE — Telephone Encounter (Signed)
Spoke with [facility] and she reported the reason for her call is to correct a order that was entered by CDW CorporationWooley S, [place]. HSV 1, they needed be to enter HSV 1 & 2 Igb.

## 2016-11-02 ENCOUNTER — Ambulatory Visit (INDEPENDENT_AMBULATORY_CARE_PROVIDER_SITE_OTHER): Payer: BC Managed Care – PPO | Admitting: Family Medicine

## 2016-11-02 ENCOUNTER — Other Ambulatory Visit (HOSPITAL_COMMUNITY)
Admission: RE | Admit: 2016-11-02 | Discharge: 2016-11-02 | Disposition: A | Discharge status: Home / Self Care | Payer: BC Managed Care – PPO | Source: Ambulatory Visit | Attending: Family Medicine | Admitting: Family Medicine

## 2016-11-02 VITALS — BP 92/58 | HR 71 | Temp 98.2°F | Resp 16 | Ht 64.0 in | Wt 141.0 lb

## 2016-11-02 DIAGNOSIS — Z309 Encounter for contraceptive management, unspecified: Secondary | ICD-10-CM

## 2016-11-02 DIAGNOSIS — Z23 Encounter for immunization: Secondary | ICD-10-CM

## 2016-11-02 DIAGNOSIS — F988 Other specified behavioral and emotional disorders with onset usually occurring in childhood and adolescence: Secondary | ICD-10-CM | POA: Diagnosis not present

## 2016-11-02 DIAGNOSIS — Z0000 Encounter for general adult medical examination without abnormal findings: Secondary | ICD-10-CM | POA: Insufficient documentation

## 2016-11-02 LAB — CBC
HCT: 38.8 % (ref 35.0–45.0)
HEMOGLOBIN: 12.5 g/dL (ref 11.7–15.5)
MCH: 29.6 pg (ref 27.0–33.0)
MCHC: 32.2 g/dL (ref 32.0–36.0)
MCV: 91.7 fL (ref 80.0–100.0)
MPV: 9.2 fL (ref 7.5–12.5)
Platelets: 326 10*3/uL (ref 140–400)
RBC: 4.23 MIL/uL (ref 3.80–5.10)
RDW: 13.6 % (ref 11.0–15.0)
WBC: 4.6 10*3/uL (ref 3.8–10.8)

## 2016-11-02 LAB — COMPREHENSIVE METABOLIC PANEL
ALBUMIN: 4 g/dL (ref 3.6–5.1)
ALK PHOS: 35 U/L (ref 33–115)
ALT: 9 U/L (ref 6–29)
AST: 16 U/L (ref 10–30)
BUN: 13 mg/dL (ref 7–25)
CHLORIDE: 106 mmol/L (ref 98–110)
CO2: 26 mmol/L (ref 20–31)
CREATININE: 0.81 mg/dL (ref 0.50–1.10)
Calcium: 8.9 mg/dL (ref 8.6–10.2)
Glucose, Bld: 79 mg/dL (ref 65–99)
POTASSIUM: 4.3 mmol/L (ref 3.5–5.3)
Sodium: 139 mmol/L (ref 135–146)
TOTAL PROTEIN: 6.6 g/dL (ref 6.1–8.1)
Total Bilirubin: 0.4 mg/dL (ref 0.2–1.2)

## 2016-11-02 LAB — LIPID PANEL
CHOL/HDL RATIO: 2.3 ratio (ref <5.0)
CHOLESTEROL: 148 mg/dL (ref <200)
HDL: 64 mg/dL (ref >50)
LDL Cholesterol: 69 mg/dL (ref <100)
TRIGLYCERIDES: 77 mg/dL (ref <150)
VLDL: 15 mg/dL (ref <30)

## 2016-11-02 LAB — TSH: TSH: 1.13 mIU/L

## 2016-11-02 NOTE — Progress Notes (Signed)
Pre visit review using our clinic review tool, if applicable. No additional management support is needed unless otherwise documented below in the visit note. 

## 2016-11-02 NOTE — Patient Instructions (Signed)
Preventive Care 18-39 Years, Female Preventive care refers to lifestyle choices and visits with your health care provider that can promote health and wellness. What does preventive care include?  A yearly physical exam. This is also called an annual well check.  Dental exams once or twice a year.  Routine eye exams. Ask your health care provider how often you should have your eyes checked.  Personal lifestyle choices, including:  Daily care of your teeth and gums.  Regular physical activity.  Eating a healthy diet.  Avoiding tobacco and drug use.  Limiting alcohol use.  Practicing safe sex.  Taking vitamin and mineral supplements as recommended by your health care provider. What happens during an annual well check? The services and screenings done by your health care provider during your annual well check will depend on your age, overall health, lifestyle risk factors, and family history of disease. Counseling  Your health care provider may ask you questions about your:  Alcohol use.  Tobacco use.  Drug use.  Emotional well-being.  Home and relationship well-being.  Sexual activity.  Eating habits.  Work and work environment.  Method of birth control.  Menstrual cycle.  Pregnancy history. Screening  You may have the following tests or measurements:  Height, weight, and BMI.  Diabetes screening. This is done by checking your blood sugar (glucose) after you have not eaten for a while (fasting).  Blood pressure.  Lipid and cholesterol levels. These may be checked every 5 years starting at age 20.  Skin check.  Hepatitis C blood test.  Hepatitis B blood test.  Sexually transmitted disease (STD) testing.  BRCA-related cancer screening. This may be done if you have a family history of breast, ovarian, tubal, or peritoneal cancers.  Pelvic exam and Pap test. This may be done every 3 years starting at age 21. Starting at age 30, this may be done every 5  years if you have a Pap test in combination with an HPV test. Discuss your test results, treatment options, and if necessary, the need for more tests with your health care provider. Vaccines  Your health care provider may recommend certain vaccines, such as:  Influenza vaccine. This is recommended every year.  Tetanus, diphtheria, and acellular pertussis (Tdap, Td) vaccine. You may need a Td booster every 10 years.  Varicella vaccine. You may need this if you have not been vaccinated.  HPV vaccine. If you are 26 or younger, you may need three doses over 6 months.  Measles, mumps, and rubella (MMR) vaccine. You may need at least one dose of MMR. You may also need a second dose.  Pneumococcal 13-valent conjugate (PCV13) vaccine. You may need this if you have certain conditions and were not previously vaccinated.  Pneumococcal polysaccharide (PPSV23) vaccine. You may need one or two doses if you smoke cigarettes or if you have certain conditions.  Meningococcal vaccine. One dose is recommended if you are age 19-21 years and a first-year college student living in a residence hall, or if you have one of several medical conditions. You may also need additional booster doses.  Hepatitis A vaccine. You may need this if you have certain conditions or if you travel or work in places where you may be exposed to hepatitis A.  Hepatitis B vaccine. You may need this if you have certain conditions or if you travel or work in places where you may be exposed to hepatitis B.  Haemophilus influenzae type b (Hib) vaccine. You may need this   if you have certain risk factors. Talk to your health care provider about which screenings and vaccines you need and how often you need them. This information is not intended to replace advice given to you by your health care provider. Make sure you discuss any questions you have with your health care provider. Document Released: 09/25/2001 Document Revised: 04/18/2016  Document Reviewed: 05/31/2015 Elsevier Interactive Patient Education  2017 [employer].

## 2016-11-02 NOTE — Progress Notes (Signed)
Subjective:  I acted as a Neurosurgeonscribe for Dr. Delman KittenLowne [name], LPN    Patient ID: [name], female    DOB: [birth year], 21 y.o.   MRN: [id number]  Chief Complaint  Patient presents with  . Annual Exam    HPI  Patient is in today for annual physical,   Patient Care Team: Donato SchultzYvonne R Jaclyn Chase, DO as PCP - General (Family Medicine)   Past Medical History:  Diagnosis Date  . ADHD (attention deficit hyperactivity disorder)   . Allergy   . Bipolar disorder (HCC)   . Eczema 12/11/13    No past surgical history on file.  Family History  Problem Relation Age of Onset  . Asthma Mother   . Stroke Father   . Heart disease Father   . Asthma Father   . Hypertension Father     pulmonary hypertension  . Heart disease Maternal Uncle   . Hypertension Maternal Grandmother   . COPD Maternal Grandmother   . Diabetes Maternal Grandfather   . Stroke Paternal Grandmother   . Diabetes Paternal Grandmother   . Diabetes Paternal Grandfather   . Leukemia Maternal Uncle     Social History   Social History  . Marital status: Single    Spouse name: N/A  . Number of children: N/A  . Years of education: N/A   Occupational History  . Not on file.   Social History Main Topics  . Smoking status: Never Smoker  . Smokeless tobacco: Never Used  . Alcohol use No  . Drug use: No  . Sexual activity: Not on file   Other Topics Concern  . Not on file   Social History Narrative  . No narrative on file    Outpatient Medications Prior to Visit  Medication Sig Dispense Refill  . amphetamine-dextroamphetamine (ADDERALL XR) 30 MG 24 hr capsule Take 30 mg by mouth every morning.  0  . chlorpheniramine (CHLOR-TRIMETON) 4 MG tablet Take 4 mg by mouth 2 (two) times daily as needed for allergies.    Marland Kitchen. ELIDEL 1 % cream     . fluticasone (FLONASE) 50 MCG/ACT nasal spray Place 2 sprays into both nostrils daily. 16 g 6  . PRESCRIPTION MEDICATION Apply 1 application topically 2 (two) times daily  as needed (itching). triamcinolone and  mixed eucerin 1:1    . SEROQUEL XR 200 MG 24 hr tablet Take 200 mg by mouth at bedtime.  5  . Vitamin D, Ergocalciferol, (DRISDOL) 50000 UNITS CAPS capsule Take 50,000 Units by mouth once a week.  12  . norgestimate-ethinyl estradiol (SPRINTEC 28) 0.25-35 MG-MCG tablet Take 1 tablet by mouth daily. 1 Package 2   No facility-administered medications prior to visit.     Allergies  Allergen Reactions  . Concerta [Methylphenidate] Other (See Comments)    hallucinations  . Clindamycin Hcl Rash  . Terbinafine Hcl Nausea And Vomiting    Review of Systems  Constitutional: Negative for fever.  HENT: Negative for congestion.   Eyes: Negative for blurred vision.  Respiratory: Negative for cough.   Cardiovascular: Negative for chest pain and palpitations.  Gastrointestinal: Negative for vomiting.  Musculoskeletal: Negative for back pain.  Skin: Negative for rash.  Neurological: Negative for loss of consciousness and headaches.       Objective:    Physical Exam  Constitutional: She is oriented to person, place, and time. She appears well-developed and well-nourished. No distress.  HENT:  Head: Normocephalic and atraumatic.  Right Ear: External  ear normal.  Left Ear: External ear normal.  Nose: Nose normal.  Mouth/Throat: Oropharynx is clear and moist.  Eyes: Conjunctivae and EOM are normal. Pupils are equal, round, and reactive to light.  Neck: Normal range of motion. Neck supple. No thyromegaly present.  Cardiovascular: Normal rate, regular rhythm and normal heart sounds.   No murmur heard. Pulmonary/Chest: Effort normal and breath sounds normal. No respiratory distress. She has no wheezes. She has no rales. She exhibits no tenderness.  Abdominal: Soft. Bowel sounds are normal. There is no tenderness. Hernia confirmed negative in the right inguinal area and confirmed negative in the left inguinal area.  Genitourinary: Vagina normal. No breast  swelling, tenderness, discharge or bleeding. There is no rash, tenderness, lesion or injury on the right labia. There is no rash, tenderness, lesion or injury on the left labia. Uterus is not deviated, not enlarged, not fixed and not tender. Cervix exhibits no motion tenderness, no discharge and no friability. Right adnexum displays no mass, no tenderness and no fullness. Left adnexum displays no mass, no tenderness and no fullness. No erythema, tenderness or bleeding in the vagina. No vaginal discharge found.  Musculoskeletal: Normal range of motion. She exhibits no edema or deformity.  Neurological: She is alert and oriented to person, place, and time.  Skin: Skin is warm and dry. She is not diaphoretic.  Psychiatric: She has a normal mood and affect. Her behavior is normal. Judgment and thought content normal.  Nursing note and vitals reviewed.   BP (!) 92/58 (BP Location: Left Arm, Patient Position: Sitting, Cuff Size: Normal)   Pulse 71   Temp 98.2 F (36.8 C) (Oral)   Resp 16   Ht 5\' 4"  (1.626 m)   Wt 141 lb (64 kg)   LMP 10/10/2016   SpO2 98%   BMI 24.20 kg/m  Wt Readings from Last 3 Encounters:  11/02/16 141 lb (64 kg)  08/03/16 142 lb 3.2 oz (64.5 kg)  01/02/16 134 lb 9.6 oz (61.1 kg)   BP Readings from Last 3 Encounters:  11/02/16 (!) 92/58  08/03/16 126/68  01/02/16 103/65     Immunization History  Administered Date(s) Administered  . DTaP 04/03/1994, 05/15/1994, 09/20/1994  . HPV Quadrivalent 03/02/2008, 03/29/2010, 03/16/2013  . Hepatitis A 02/04/2006  . Hepatitis A, Adult 08/02/2014  . Hepatitis B 04/03/94, 04/03/1994, 09/20/1994  . HiB (PRP-OMP) 04/03/1994, 05/15/1994, 09/20/1994  . IPV 04/03/1994, 05/15/1994, 09/20/1994  . Influenza Split 06/30/2012  . Influenza,inj,Quad PF,36+ Mos 06/22/2014, 05/13/2015  . Influenza-Unspecified 04/12/2016  . Meningococcal B, OMV 11/02/2016  . Meningococcal Conjugate 03/02/2008, 03/16/2013  . Td 04/02/2005  . Tdap  04/02/2005, 11/02/2016    Health Maintenance  Topic Date Due  . PAP SMEAR  02/04/2015  . TETANUS/TDAP  11/03/2026  . INFLUENZA VACCINE  Completed  . HIV Screening  Completed    Lab Results  Component Value Date   WBC 4.6 11/02/2016   HGB 12.5 11/02/2016   HCT 38.8 11/02/2016   PLT 326 11/02/2016   GLUCOSE 79 11/02/2016   CHOL 148 11/02/2016   TRIG 77 11/02/2016   HDL 64 11/02/2016   LDLCALC 69 11/02/2016   ALT 9 11/02/2016   AST 16 11/02/2016   NA 139 11/02/2016   K 4.3 11/02/2016   CL 106 11/02/2016   CREATININE 0.81 11/02/2016   BUN 13 11/02/2016   CO2 26 11/02/2016   TSH 1.13 11/02/2016   MICROALBUR 0.6 08/02/2014    Lab Results  Component Value Date  TSH 1.13 11/02/2016   Lab Results  Component Value Date   WBC 4.6 11/02/2016   HGB 12.5 11/02/2016   HCT 38.8 11/02/2016   MCV 91.7 11/02/2016   PLT 326 11/02/2016   Lab Results  Component Value Date   NA 139 11/02/2016   K 4.3 11/02/2016   CO2 26 11/02/2016   GLUCOSE 79 11/02/2016   BUN 13 11/02/2016   CREATININE 0.81 11/02/2016   BILITOT 0.4 11/02/2016   ALKPHOS 35 11/02/2016   AST 16 11/02/2016   ALT 9 11/02/2016   PROT 6.6 11/02/2016   ALBUMIN 4.0 11/02/2016   CALCIUM 8.9 11/02/2016   GFR 166.30 08/02/2014   Lab Results  Component Value Date   CHOL 148 11/02/2016   Lab Results  Component Value Date   HDL 64 11/02/2016   Lab Results  Component Value Date   LDLCALC 69 11/02/2016   Lab Results  Component Value Date   TRIG 77 11/02/2016   Lab Results  Component Value Date   CHOLHDL 2.3 11/02/2016   No results found for: HGBA1C       Assessment & Plan:   Problem List Items Addressed This Visit      Unprioritized   ADD (attention deficit disorder)   Preventative health care - Primary    See AVS ghm utd Check labs Men vaccine given tdap given       Relevant Orders   HIV antibody (Completed)   Cytology - PAP   POCT Urinalysis Dipstick (Automated)   CBC (Completed)     Comprehensive metabolic panel (Completed)   Lipid panel (Completed)   TSH (Completed)   RPR (Completed)   HSV 1 AND 2 IGM ABS, INDIRECT    Other Visit Diagnoses    Need for meningococcal vaccination       Relevant Orders   Meningococcal B, OMV (Bexsero) (Completed)   Need for Tdap vaccination       Encounter for contraceptive management, unspecified type       Relevant Medications   norgestimate-ethinyl estradiol (SPRINTEC 28) 0.25-35 MG-MCG tablet      I am having Ms. Kellogg maintain her PRESCRIPTION MEDICATION, ELIDEL, SEROQUEL XR, Vitamin D (Ergocalciferol), chlorpheniramine, fluticasone, amphetamine-dextroamphetamine, and norgestimate-ethinyl estradiol.  Meds ordered this encounter  Medications  . norgestimate-ethinyl estradiol (SPRINTEC 28) 0.25-35 MG-MCG tablet    Sig: Take 1 tablet by mouth daily.    Dispense:  3 Package    Refill:  3    CMA served as Neurosurgeon during this visit. History, Physical and Plan performed by medical provider. Documentation and orders reviewed and attested to.  [name], DO   Patient ID: [name], female   DOB: 1996/02/12, 22 y.o.   MRN: [id number]

## 2016-11-03 ENCOUNTER — Encounter: Payer: Self-pay | Admitting: Family Medicine

## 2016-11-03 LAB — RPR

## 2016-11-03 LAB — HIV ANTIBODY (ROUTINE TESTING W REFLEX): HIV: NONREACTIVE

## 2016-11-03 MED ORDER — NORGESTIMATE-ETH ESTRADIOL 0.25-35 MG-MCG PO TABS: 1.0000 | ORAL_TABLET | Freq: Every day | ORAL | 3 refills | Status: DC

## 2016-11-03 NOTE — Assessment & Plan Note (Signed)
See AVS ghm utd Check labs Men vaccine given tdap given

## 2016-11-06 LAB — CYTOLOGY - PAP
BACTERIAL VAGINITIS: POSITIVE — AB
CANDIDA VAGINITIS: NEGATIVE
Chlamydia: NEGATIVE
DIAGNOSIS: NEGATIVE
Neisseria Gonorrhea: NEGATIVE
Trichomonas: NEGATIVE

## 2016-11-06 LAB — HSV 1 AND 2 IGM ABS, INDIRECT: HSV 1 IgM: <1:10 {titer}

## 2016-11-07 ENCOUNTER — Other Ambulatory Visit: Payer: Self-pay | Admitting: Family Medicine

## 2016-11-07 MED ORDER — METRONIDAZOLE 500 MG PO TABS: 500.0000 mg | ORAL_TABLET | Freq: Two times a day (BID) | ORAL | 0 refills | Status: DC

## 2016-11-10 ENCOUNTER — Other Ambulatory Visit: Payer: Self-pay | Admitting: Family Medicine

## 2016-12-06 ENCOUNTER — Ambulatory Visit (INDEPENDENT_AMBULATORY_CARE_PROVIDER_SITE_OTHER): Payer: BC Managed Care – PPO | Admitting: Family Medicine

## 2016-12-06 ENCOUNTER — Encounter: Payer: Self-pay | Admitting: Family Medicine

## 2016-12-06 VITALS — BP 102/70 | HR 90 | Temp 98.2°F | Ht 64.0 in | Wt 143.8 lb

## 2016-12-06 DIAGNOSIS — N912 Amenorrhea, unspecified: Secondary | ICD-10-CM | POA: Diagnosis not present

## 2016-12-06 DIAGNOSIS — R509 Fever, unspecified: Secondary | ICD-10-CM | POA: Diagnosis not present

## 2016-12-06 LAB — POCT INFLUENZA A/B
INFLUENZA B, POC: NEGATIVE
Influenza A, POC: NEGATIVE

## 2016-12-06 LAB — POCT URINE PREGNANCY: PREG TEST UR: NEGATIVE

## 2016-12-06 NOTE — Progress Notes (Signed)
[facility] [address] [place], [place] [address] [phone]  Date:  12/06/2016   Name:  [name]   DOB:  July 05, 1996   MRN:  [id number]  PCP:  [name], DO    Chief Complaint: Amenorrhea (c/o cycle being late this month. Pt states that she had a fever of 102 and headahce last night. )   History of Present Illness:  [name] is a 21 y.o. very pleasant female patient who presents with the following:  Pt of Dr. Laury Axon- here today with concern of illness.   She is worried that she could be pregnant Her menses are actually not late yet- she missed one OCP about a week ago, but made it up the next day.  She would like to have a urine pregnancy test as she is feeling concerned.    Last night she had a HA and fever of 102.  She is now feeling back to normal. Npo longer has a HA No fever or ST.  No earache She did take some tylenol at 0300 and 0700 today but this would have worn off by now No cough No GI symptoms No urinary sx She is not aware of any exposure to illness.   She had STI testing a month ago which was negative    Patient Active Problem List   Diagnosis Date Noted  . STD exposure 05/13/2015  . Preventative health care 03/16/2013  . Bipolar disorder (HCC) 12/30/2012  . ADD (attention deficit disorder) 04/24/2011    Past Medical History:  Diagnosis Date  . ADHD (attention deficit hyperactivity disorder)   . Allergy   . Bipolar disorder (HCC)   . Eczema 12/11/13    No past surgical history on file.  Social History  Substance Use Topics  . Smoking status: Never Smoker  . Smokeless tobacco: Never Used  . Alcohol use No    Family History  Problem Relation Age of Onset  . Asthma Mother   . Stroke Father   . Heart disease Father   . Asthma Father   . Hypertension Father     pulmonary hypertension  . Heart disease Maternal Uncle   . Hypertension Maternal Grandmother    . COPD Maternal Grandmother   . Diabetes Maternal Grandfather   . Stroke Paternal Grandmother   . Diabetes Paternal Grandmother   . Diabetes Paternal Grandfather   . Leukemia Maternal Uncle     Allergies  Allergen Reactions  . Concerta [Methylphenidate] Other (See Comments)    hallucinations  . Clindamycin Hcl Rash  . Terbinafine Hcl Nausea And Vomiting    Medication list has been reviewed and updated.  Current Outpatient Prescriptions on File Prior to Visit  Medication Sig Dispense Refill  . amphetamine-dextroamphetamine (ADDERALL XR) 30 MG 24 hr capsule Take 30 mg by mouth every morning.  0  . chlorpheniramine (CHLOR-TRIMETON) 4 MG tablet Take 4 mg by mouth 2 (two) times daily as needed for allergies.    Marland Kitchen ELIDEL 1 % cream     . fluticasone (FLONASE) 50 MCG/ACT nasal spray Place 2 sprays into both nostrils daily. 16 g 6  . norgestimate-ethinyl estradiol (SPRINTEC 28) 0.25-35 MG-MCG tablet Take 1 tablet by mouth daily. 3 Package 3  . PRESCRIPTION MEDICATION Apply 1 application topically 2 (two) times daily as needed (itching). triamcinolone and  mixed eucerin 1:1    . SEROQUEL XR 200 MG 24  hr tablet Take 200 mg by mouth at bedtime.  5  . SPRINTEC 28 0.25-35 MG-MCG tablet TAKE 1 TABLET BY MOUTH DAILY. 28 tablet 2   No current facility-administered medications on file prior to visit.     Review of Systems:  As per HPI- otherwise negative. No rash  Physical Examination: Vitals:   12/06/16 1341  BP: 102/70  Pulse: (!) 106  Temp: 98.2 F (36.8 C)   Vitals:   12/06/16 1341  Weight: 143 lb 12.8 oz (65.2 kg)  Height:  (1.626 m)   Body mass index is 24.68 kg/m. Ideal Body Weight: Weight in (lb) to have BMI = 25: 145.3  GEN: WDWN, NAD, Non-toxic, A & O x 3, looks well, no meningismus HEENT: Atraumatic, Normocephalic. Neck supple. No masses, No LAD. Bilateral TM wnl, oropharynx normal.  PEERL,EOMI.   Ears and Nose: No external deformity. CV: RRR, No M/G/R. No  JVD. No thrill. No extra heart sounds. PULM: CTA B, no wheezes, crackles, rhonchi. No retractions. No resp. distress. No accessory muscle use. ABD: S, NT, ND, +BS. No rebound. No HSM.  Benign belly EXTR: No c/c/e NEURO Normal gait.  PSYCH: Normally interactive. Conversant. Not depressed or anxious appearing.  Calm demeanor.   Results for orders placed or performed in visit on 12/06/16  POCT urine pregnancy  Result Value Ref Range   Preg Test, Ur Negative Negative  POCT Influenza A/B  Result Value Ref Range   Influenza A, POC Negative Negative   Influenza B, POC Negative Negative   BP Readings from Last 3 Encounters:  12/06/16 102/70  11/02/16 (!) 92/58  08/03/16 126/68     Assessment and Plan: Fever, unspecified - Plan: POCT Influenza A/B  Amenorrhea - Plan: POCT urine pregnancy  Here today with concern of pregnancy and illness yesterday She reports a fever to 102 last night- she is now AF and feels well.  No apparent cause of her fever. Flu swab is negative. Offered to do blood work for her- she declines but will seek care if her sx return Counseled that her HCG is negative today- however it could be too early to determine pregnancy. However taking one dose of her OCP late is unlikely to result in pregnancy - would advise that she take a home HCG if her menses do not come as expected   She states understanding and agreement with plan  Signed Abbe Amsterdam, MD

## 2016-12-06 NOTE — Patient Instructions (Addendum)
Your pregnancy test is negative today.   However, if you do not get your period as expected in a few days certainly take a home test.   Your flu test is negative Please let me know if you have other problems or if the fever comes back

## 2016-12-11 ENCOUNTER — Ambulatory Visit: Payer: BC Managed Care – PPO | Admitting: Family Medicine

## 2016-12-11 ENCOUNTER — Encounter: Payer: Self-pay | Admitting: Family Medicine

## 2016-12-11 ENCOUNTER — Ambulatory Visit (INDEPENDENT_AMBULATORY_CARE_PROVIDER_SITE_OTHER): Payer: BC Managed Care – PPO | Admitting: Family Medicine

## 2016-12-11 ENCOUNTER — Other Ambulatory Visit (HOSPITAL_COMMUNITY)
Admission: RE | Admit: 2016-12-11 | Discharge: 2016-12-11 | Disposition: A | Discharge status: Home / Self Care | Payer: BC Managed Care – PPO | Source: Ambulatory Visit | Attending: Family Medicine | Admitting: Family Medicine

## 2016-12-11 VITALS — BP 120/70 | HR 88 | Temp 97.9°F | Resp 16 | Ht 64.0 in | Wt 138.4 lb

## 2016-12-11 DIAGNOSIS — N898 Other specified noninflammatory disorders of vagina: Secondary | ICD-10-CM | POA: Diagnosis not present

## 2016-12-11 LAB — POC URINALSYSI DIPSTICK (AUTOMATED)
GLUCOSE UA: NEGATIVE
KETONES UA: NEGATIVE
LEUKOCYTES UA: NEGATIVE
NITRITE UA: NEGATIVE
PH UA: 6 (ref 5.0–8.0)
RBC UA: NEGATIVE
Spec Grav, UA: >=1.03 — AB (ref 1.010–1.025)
Urobilinogen, UA: 0.2 E.U./dL

## 2016-12-11 NOTE — Progress Notes (Signed)
Pre visit review using our clinic review tool, if applicable. No additional management support is needed unless otherwise documented below in the visit note. 

## 2016-12-11 NOTE — Progress Notes (Signed)
Patient ID: [name], female   DOB: Oct 02, 1995, 21 y.o.   MRN: [id number]     Subjective:    Patient ID: [name], female    DOB: 1995/09/11, 21 y.o.   MRN: [id number]  No chief complaint on file.   HPI Patient is in today for clear vaginal discharge. She states it started about 4 days ago.  She started her period today. No odor, watery, yellow d/x , no abd pain.     Past Medical History:  Diagnosis Date  . ADHD (attention deficit hyperactivity disorder)   . Allergy   . Bipolar disorder (HCC)   . Eczema 12/11/13    No past surgical history on file.  Family History  Problem Relation Age of Onset  . Asthma Mother   . Stroke Father   . Heart disease Father   . Asthma Father   . Hypertension Father     pulmonary hypertension  . Heart disease Maternal Uncle   . Hypertension Maternal Grandmother   . COPD Maternal Grandmother   . Diabetes Maternal Grandfather   . Stroke Paternal Grandmother   . Diabetes Paternal Grandmother   . Diabetes Paternal Grandfather   . Leukemia Maternal Uncle     Social History   Social History  . Marital status: Single    Spouse name: N/A  . Number of children: N/A  . Years of education: N/A   Occupational History  . Not on file.   Social History Main Topics  . Smoking status: Never Smoker  . Smokeless tobacco: Never Used  . Alcohol use No  . Drug use: No  . Sexual activity: Not on file   Other Topics Concern  . Not on file   Social History Narrative  . No narrative on file    Outpatient Medications Prior to Visit  Medication Sig Dispense Refill  . amphetamine-dextroamphetamine (ADDERALL XR) 30 MG 24 hr capsule Take 30 mg by mouth every morning.  0  . chlorpheniramine (CHLOR-TRIMETON) 4 MG tablet Take 4 mg by mouth 2 (two) times daily as needed for allergies.    Marland Kitchen ELIDEL 1 % cream     . fluticasone (FLONASE) 50 MCG/ACT nasal spray Place 2 sprays into both nostrils daily. 16 g 6  . norgestimate-ethinyl estradiol  (SPRINTEC 28) 0.25-35 MG-MCG tablet Take 1 tablet by mouth daily. 3 Package 3  . PRESCRIPTION MEDICATION Apply 1 application topically 2 (two) times daily as needed (itching). triamcinolone and  mixed eucerin 1:1    . SEROQUEL XR 200 MG 24 hr tablet Take 200 mg by mouth at bedtime.  5  . SPRINTEC 28 0.25-35 MG-MCG tablet TAKE 1 TABLET BY MOUTH DAILY. 28 tablet 2   No facility-administered medications prior to visit.     Allergies  Allergen Reactions  . Concerta [Methylphenidate] Other (See Comments)    hallucinations  . Clindamycin Hcl Rash  . Terbinafine Hcl Nausea And Vomiting    Review of Systems  Constitutional: Negative for fever and malaise/fatigue.  HENT: Negative for congestion.   Eyes: Negative for blurred vision.  Respiratory: Negative for cough and shortness of breath.   Cardiovascular: Negative for chest pain, palpitations and leg swelling.  Gastrointestinal: Negative for vomiting.  Genitourinary: Negative for dysuria, flank pain, frequency, hematuria and urgency.  Musculoskeletal: Negative for back pain.  Skin: Negative for rash.  Neurological: Negative for loss of consciousness and headaches.       Objective:    Physical Exam  Constitutional: She  is oriented to person, place, and time. She appears well-developed and well-nourished.  HENT:  Head: Normocephalic and atraumatic.  Eyes: Conjunctivae and EOM are normal.  Neck: Normal range of motion. Neck supple. No JVD present. Carotid bruit is not present. No thyromegaly present.  Cardiovascular: Normal rate, regular rhythm and normal heart sounds.   No murmur heard. Pulmonary/Chest: Effort normal and breath sounds normal. No respiratory distress. She has no wheezes. She has no rales. She exhibits no tenderness.  Musculoskeletal: She exhibits no edema.  Neurological: She is alert and oriented to person, place, and time.  Psychiatric: She has a normal mood and affect.  Nursing note and vitals reviewed.   BP  120/70 (BP Location: Left Arm, Cuff Size: Normal)   Pulse 88   Temp 97.9 F (36.6 C) (Oral)   Resp 16   Ht  (1.626 m)   Wt 138 lb 6.4 oz (62.8 kg)   LMP 12/11/2016   SpO2 100%   BMI 23.76 kg/m  Wt Readings from Last 3 Encounters:  12/11/16 138 lb 6.4 oz (62.8 kg)  12/06/16 143 lb 12.8 oz (65.2 kg)  11/02/16 141 lb (64 kg)     Lab Results  Component Value Date   WBC 4.6 11/02/2016   HGB 12.5 11/02/2016   HCT 38.8 11/02/2016   PLT 326 11/02/2016   GLUCOSE 79 11/02/2016   CHOL 148 11/02/2016   TRIG 77 11/02/2016   HDL 64 11/02/2016   LDLCALC 69 11/02/2016   ALT 9 11/02/2016   AST 16 11/02/2016   NA 139 11/02/2016   K 4.3 11/02/2016   CL 106 11/02/2016   CREATININE 0.81 11/02/2016   BUN 13 11/02/2016   CO2 26 11/02/2016   TSH 1.13 11/02/2016   MICROALBUR 0.6 08/02/2014    Lab Results  Component Value Date   TSH 1.13 11/02/2016   Lab Results  Component Value Date   WBC 4.6 11/02/2016   HGB 12.5 11/02/2016   HCT 38.8 11/02/2016   MCV 91.7 11/02/2016   PLT 326 11/02/2016   Lab Results  Component Value Date   NA 139 11/02/2016   K 4.3 11/02/2016   CO2 26 11/02/2016   GLUCOSE 79 11/02/2016   BUN 13 11/02/2016   CREATININE 0.81 11/02/2016   BILITOT 0.4 11/02/2016   ALKPHOS 35 11/02/2016   AST 16 11/02/2016   ALT 9 11/02/2016   PROT 6.6 11/02/2016   ALBUMIN 4.0 11/02/2016   CALCIUM 8.9 11/02/2016   GFR 166.30 08/02/2014   Lab Results  Component Value Date   CHOL 148 11/02/2016   Lab Results  Component Value Date   HDL 64 11/02/2016   Lab Results  Component Value Date   LDLCALC 69 11/02/2016   Lab Results  Component Value Date   TRIG 77 11/02/2016   Lab Results  Component Value Date   CHOLHDL 2.3 11/02/2016   No results found for: HGBA1C     Assessment & Plan:   Problem List Items Addressed This Visit    None    Visit Diagnoses    Vaginal discharge    -  Primary   Relevant Orders   Urine cytology ancillary only   POCT  Urinalysis Dipstick (Automated) (Completed)      I am having Ms. Leap maintain her PRESCRIPTION MEDICATION, ELIDEL, SEROQUEL XR, chlorpheniramine, fluticasone, amphetamine-dextroamphetamine, norgestimate-ethinyl estradiol, SPRINTEC 28, ALPRAZolam, and Vitamin D (Ergocalciferol).  Meds ordered this encounter  Medications  . ALPRAZolam (XANAX) 0.5 MG tablet  .  Vitamin D, Ergocalciferol, (DRISDOL) 50000 units CAPS capsule   [name], DO

## 2016-12-11 NOTE — Patient Instructions (Signed)
Cervicitis Cervicitis is when the cervix gets irritated and swollen. Your cervix is the lower end of your uterus. Follow these instructions at home:  Do not have sex until your doctor says it is okay.  Take over-the-counter and prescription medicines only as told by your doctor.  If you were prescribed an antibiotic medicine, take it as told by your doctor. Do not stop taking it even if you start to feel better.  Keep all follow-up visits as told by your doctor. This is important. Contact a doctor if:  Your symptoms come back after treatment.  Your symptoms get worse after treatment.  You have a fever.  You feel tired (fatigued).  Your belly (abdomen) hurts.  You feel like you are going to throw up (are nauseous).  You throw up (vomit).  You have watery poop (diarrhea).  Your back hurts. Get help right away if:  You have very bad pain in your belly, and medicine does not help it.  You cannot pee (urinate). Summary  Cervicitis is when the cervix gets irritated and swollen.  Do not have sex until your doctor says it is okay.  If you need to take an antibiotic, do not stop taking even if you start to feel better. Take medicines only as told by your doctor. This information is not intended to replace advice given to you by your health care provider. Make sure you discuss any questions you have with your health care provider. Document Released: 05/08/2008 Document Revised: 04/15/2016 Document Reviewed: 04/15/2016 Elsevier Interactive Patient Education  2017 Elsevier Inc.  

## 2016-12-14 LAB — URINE CYTOLOGY ANCILLARY ONLY
CHLAMYDIA, DNA PROBE: NEGATIVE
Neisseria Gonorrhea: NEGATIVE
Trichomonas: NEGATIVE

## 2016-12-17 ENCOUNTER — Other Ambulatory Visit: Payer: Self-pay | Admitting: Family Medicine

## 2016-12-17 LAB — URINE CYTOLOGY ANCILLARY ONLY
Bacterial vaginitis: POSITIVE — AB
CANDIDA VAGINITIS: NEGATIVE

## 2016-12-17 MED ORDER — METRONIDAZOLE 500 MG PO TABS: 500.0000 mg | ORAL_TABLET | Freq: Two times a day (BID) | ORAL | 0 refills | Status: DC

## 2017-02-27 ENCOUNTER — Telehealth: Payer: Self-pay | Admitting: Family Medicine

## 2017-02-27 NOTE — Telephone Encounter (Signed)
Patient Name: [name]  DOB: Aug 16, 1995    Initial Comment Caller states that she went to the bathroom last night and hit her head on the wall. She said that she passed out and her mom found her this morning.    Nurse Assessment      Guidelines    Guideline Title Affirmed Question Affirmed Notes       Final Disposition User   FINAL ATTEMPT MADE - no message left Carmon, RN, Angelique BlonderDenise    Comments  Patients phone numbers given are both consistently busy.

## 2017-03-01 ENCOUNTER — Ambulatory Visit (INDEPENDENT_AMBULATORY_CARE_PROVIDER_SITE_OTHER): Payer: BC Managed Care – PPO | Admitting: Family Medicine

## 2017-03-01 ENCOUNTER — Encounter: Payer: Self-pay | Admitting: Family Medicine

## 2017-03-01 VITALS — BP 118/70 | HR 110 | Temp 98.4°F | Resp 16 | Ht 64.0 in | Wt 136.4 lb

## 2017-03-01 DIAGNOSIS — R5383 Other fatigue: Secondary | ICD-10-CM | POA: Diagnosis not present

## 2017-03-01 DIAGNOSIS — R55 Syncope and collapse: Secondary | ICD-10-CM

## 2017-03-01 DIAGNOSIS — E559 Vitamin D deficiency, unspecified: Secondary | ICD-10-CM | POA: Diagnosis not present

## 2017-03-01 LAB — CBC WITH DIFFERENTIAL/PLATELET
Basophils Absolute: 0 10*3/uL (ref 0.0–0.1)
Basophils Relative: 0.6 % (ref 0.0–3.0)
EOS PCT: 2.8 % (ref 0.0–5.0)
Eosinophils Absolute: 0.1 10*3/uL (ref 0.0–0.7)
HCT: 40.3 % (ref 36.0–46.0)
Hemoglobin: 13.3 g/dL (ref 12.0–15.0)
Lymphocytes Relative: 43.5 % (ref 12.0–46.0)
Lymphs Abs: 1.8 10*3/uL (ref 0.7–4.0)
MCHC: 33 g/dL (ref 30.0–36.0)
MCV: 90.8 fl (ref 78.0–100.0)
MONO ABS: 0.3 10*3/uL (ref 0.1–1.0)
Monocytes Relative: 8.6 % (ref 3.0–12.0)
Neutro Abs: 1.8 10*3/uL (ref 1.4–7.7)
Neutrophils Relative %: 44.5 % (ref 43.0–77.0)
Platelets: 319 10*3/uL (ref 150.0–400.0)
RBC: 4.44 Mil/uL (ref 3.87–5.11)
RDW: 14.1 % (ref 11.5–15.5)
WBC: 4 10*3/uL (ref 4.0–10.5)

## 2017-03-01 LAB — COMPREHENSIVE METABOLIC PANEL
ALK PHOS: 34 U/L — AB (ref 39–117)
ALT: 17 U/L (ref 0–35)
AST: 16 U/L (ref 0–37)
Albumin: 4.3 g/dL (ref 3.5–5.2)
BUN: 9 mg/dL (ref 6–23)
CHLORIDE: 103 meq/L (ref 96–112)
CO2: 26 mEq/L (ref 19–32)
Calcium: 9.4 mg/dL (ref 8.4–10.5)
Creatinine, Ser: 0.78 mg/dL (ref 0.40–1.20)
GFR: 117.63 mL/min (ref >60.00)
Glucose, Bld: 90 mg/dL (ref 70–99)
POTASSIUM: 3.6 meq/L (ref 3.5–5.1)
Sodium: 136 mEq/L (ref 135–145)
TOTAL PROTEIN: 7.4 g/dL (ref 6.0–8.3)
Total Bilirubin: 0.7 mg/dL (ref 0.2–1.2)

## 2017-03-01 LAB — TSH: TSH: 1.2 u[IU]/mL (ref 0.35–4.50)

## 2017-03-01 LAB — VITAMIN B12: Vitamin B-12: 390 pg/mL (ref 211–911)

## 2017-03-01 LAB — VITAMIN D 25 HYDROXY (VIT D DEFICIENCY, FRACTURES): VITD: 61.42 ng/mL (ref 30.00–100.00)

## 2017-03-01 NOTE — Telephone Encounter (Signed)
Follow up cal made to patient states she  has appointment scheduled for today with Dr. Laury AxonLowne.

## 2017-03-01 NOTE — Progress Notes (Signed)
Patient ID: Trula SladeShekinah Y Bucholz, female   DOB: Jan 17, 1996, 21 y.o.   MRN: [id number]     Subjective:  I acted as a Neurosurgeonscribe for Dr. Zola ButtonLowne-Chase.  Apolonio SchneidersSheketia, CMA   Patient ID: Trula SladeShekinah Y Deis, female    DOB: Jan 17, 1996, 21 y.o.   MRN: [id number]  Chief Complaint  Patient presents with  . Near Syncope    fainted tuesday night    HPI  Patient is in today because she had a fainting spell on Tuesday night.  She got up to go to the bathroom and passed out.  Her mom who is with her today stated that it took her a minute to come to.  She stated that her head only hurt a little bit on Wednesday.  She denies today of any nausea, headaches and dizziness.  She thinks it may be due to the seroquel.  She also wants her vitamin d level checked today.  Pt got up about about 130 am on Wednesday to go to br--- pt does not remember this,  Her mom witnessed this and then she passed out and hit her head on wall.  She was out for a few minutes and then her mom shook her and she woke up.  She admitted to taking 2 xanax and not one and starting right back on 300 mg seroquel.  She has been sleep walking .  She is feeling ok  Patient Care Team: Zola ButtonLowne Chase, Grayling CongressYvonne R, DO as PCP - General (Family Medicine)   Past Medical History:  Diagnosis Date  . ADHD (attention deficit hyperactivity disorder)   . Allergy   . Bipolar disorder (HCC)   . Eczema 12/11/13    No past surgical history on file.  Family History  Problem Relation Age of Onset  . Asthma Mother   . Stroke Father   . Heart disease Father   . Asthma Father   . Hypertension Father        pulmonary hypertension  . Heart disease Maternal Uncle   . Hypertension Maternal Grandmother   . COPD Maternal Grandmother   . Diabetes Maternal Grandfather   . Stroke Paternal Grandmother   . Diabetes Paternal Grandmother   . Diabetes Paternal Grandfather   . Leukemia Maternal Uncle     Social History   Social History  . Marital status: Single    Spouse name:  N/A  . Number of children: N/A  . Years of education: N/A   Occupational History  . Not on file.   Social History Main Topics  . Smoking status: Never Smoker  . Smokeless tobacco: Never Used  . Alcohol use No  . Drug use: No  . Sexual activity: Not on file   Other Topics Concern  . Not on file   Social History Narrative  . No narrative on file    Outpatient Medications Prior to Visit  Medication Sig Dispense Refill  . ALPRAZolam (XANAX) 0.5 MG tablet     . amphetamine-dextroamphetamine (ADDERALL XR) 30 MG 24 hr capsule Take 30 mg by mouth every morning.  0  . chlorpheniramine (CHLOR-TRIMETON) 4 MG tablet Take 4 mg by mouth 2 (two) times daily as needed for allergies.    Marland Kitchen. ELIDEL 1 % cream     . fluticasone (FLONASE) 50 MCG/ACT nasal spray Place 2 sprays into both nostrils daily. 16 g 6  . metroNIDAZOLE (FLAGYL) 500 MG tablet Take 1 tablet (500 mg total) by mouth 2 (two) times daily. Take for 7  days 14 tablet 0  . PRESCRIPTION MEDICATION Apply 1 application topically 2 (two) times daily as needed (itching). triamcinolone and  mixed eucerin 1:1    . SPRINTEC 28 0.25-35 MG-MCG tablet TAKE 1 TABLET BY MOUTH DAILY. 28 tablet 2  . Vitamin D, Ergocalciferol, (DRISDOL) 50000 units CAPS capsule     . norgestimate-ethinyl estradiol (SPRINTEC 28) 0.25-35 MG-MCG tablet Take 1 tablet by mouth daily. 3 Package 3  . SEROQUEL XR 200 MG 24 hr tablet Take 200 mg by mouth at bedtime.  5   No facility-administered medications prior to visit.     Allergies  Allergen Reactions  . Concerta [Methylphenidate] Other (See Comments)    hallucinations  . Clindamycin Hcl Rash  . Terbinafine Hcl Nausea And Vomiting    Review of Systems  Constitutional: Negative for fever and malaise/fatigue.  HENT: Negative for congestion.   Eyes: Negative for blurred vision.  Respiratory: Negative for cough and shortness of breath.   Cardiovascular: Negative for chest pain, palpitations and leg swelling.    Gastrointestinal: Negative for nausea and vomiting.  Musculoskeletal: Negative for back pain.  Skin: Negative for rash.  Neurological: Negative for dizziness, loss of consciousness and headaches.  Psychiatric/Behavioral: Negative for depression, hallucinations, memory loss, substance abuse and suicidal ideas. The patient is not nervous/anxious and does not have insomnia.        Objective:    Physical Exam  Constitutional: She is oriented to person, place, and time. She appears well-developed and well-nourished. No distress.  HENT:  Head: Normocephalic and atraumatic.  Eyes: Conjunctivae and EOM are normal.  Neck: Normal range of motion. Neck supple. No JVD present. Carotid bruit is not present. No thyromegaly present.  Cardiovascular: Normal rate, regular rhythm and normal heart sounds.   No murmur heard. Pulmonary/Chest: Effort normal and breath sounds normal. No respiratory distress. She has no wheezes. She has no rales. She exhibits no tenderness.  Abdominal: Soft. Bowel sounds are normal. There is no tenderness.  Musculoskeletal: Normal range of motion. She exhibits no edema or deformity.  Neurological: She is alert and oriented to person, place, and time.  Skin: Skin is warm and dry. She is not diaphoretic.  Psychiatric: She has a normal mood and affect. Her behavior is normal. Judgment and thought content normal.  Nursing note and vitals reviewed.   BP 118/70 (BP Location: Right Arm, Cuff Size: Normal)   Pulse (!) 110   Temp 98.4 F (36.9 C) (Oral)   Resp 16   Ht 5\' 4"  (1.626 m)   Wt 136 lb 6.4 oz (61.9 kg)   LMP 03/01/2017   SpO2 99%   BMI 23.41 kg/m  Wt Readings from Last 3 Encounters:  03/01/17 136 lb 6.4 oz (61.9 kg)  12/11/16 138 lb 6.4 oz (62.8 kg)  12/06/16 143 lb 12.8 oz (65.2 kg)   BP Readings from Last 3 Encounters:  03/01/17 118/70  12/11/16 120/70  12/06/16 102/70     Immunization History  Administered Date(s) Administered  . DTaP 04/03/1994,  05/15/1994, 09/20/1994  . HPV Quadrivalent 03/02/2008, 03/29/2010, 03/16/2013  . Hepatitis A 02/04/2006  . Hepatitis A, Adult 08/02/2014  . Hepatitis B [birth year] 1993, 04/03/1994, 09/20/1994  . HiB (PRP-OMP) 04/03/1994, 05/15/1994, 09/20/1994  . IPV 04/03/1994, 05/15/1994, 09/20/1994  . Influenza Split 06/30/2012  . Influenza,inj,Quad PF,36+ Mos 06/22/2014, 05/13/2015  . Influenza-Unspecified 04/12/2016  . Meningococcal B, OMV 11/02/2016  . Meningococcal Conjugate 03/02/2008, 03/16/2013  . Td 04/02/2005  . Tdap 04/02/2005, 11/02/2016    Health  Maintenance  Topic Date Due  . INFLUENZA VACCINE  03/13/2017  . PAP SMEAR  11/03/2019  . TETANUS/TDAP  11/03/2026  . HIV Screening  Completed    Lab Results  Component Value Date   WBC 4.0 03/01/2017   HGB 13.3 03/01/2017   HCT 40.3 03/01/2017   PLT 319.0 03/01/2017   GLUCOSE 90 03/01/2017   CHOL 148 11/02/2016   TRIG 77 11/02/2016   HDL 64 11/02/2016   LDLCALC 69 11/02/2016   ALT 17 03/01/2017   AST 16 03/01/2017   NA 136 03/01/2017   K 3.6 03/01/2017   CL 103 03/01/2017   CREATININE 0.78 03/01/2017   BUN 9 03/01/2017   CO2 26 03/01/2017   TSH 1.20 03/01/2017   MICROALBUR 0.6 08/02/2014    Lab Results  Component Value Date   TSH 1.20 03/01/2017   Lab Results  Component Value Date   WBC 4.0 03/01/2017   HGB 13.3 03/01/2017   HCT 40.3 03/01/2017   MCV 90.8 03/01/2017   PLT 319.0 03/01/2017   Lab Results  Component Value Date   NA 136 03/01/2017   K 3.6 03/01/2017   CO2 26 03/01/2017   GLUCOSE 90 03/01/2017   BUN 9 03/01/2017   CREATININE 0.78 03/01/2017   BILITOT 0.7 03/01/2017   ALKPHOS 34 (L) 03/01/2017   AST 16 03/01/2017   ALT 17 03/01/2017   PROT 7.4 03/01/2017   ALBUMIN 4.3 03/01/2017   CALCIUM 9.4 03/01/2017   GFR 117.63 03/01/2017   Lab Results  Component Value Date   CHOL 148 11/02/2016   Lab Results  Component Value Date   HDL 64 11/02/2016   Lab Results  Component Value Date   LDLCALC 69  11/02/2016   Lab Results  Component Value Date   TRIG 77 11/02/2016   Lab Results  Component Value Date   CHOLHDL 2.3 11/02/2016   No results found for: HGBA1C       Assessment & Plan:   Problem List Items Addressed This Visit      Unprioritized   Syncope    Check labs  May have been from stopping meds suddenly than restarting D/w psych       Other Visit Diagnoses    Vitamin D deficiency    -  Primary   Relevant Orders   VITAMIN D 25 Hydroxy (Vit-D Deficiency, Fractures) (Completed)   Fatigue, unspecified type       Relevant Orders   CBC with Differential/Platelet (Completed)   Comprehensive metabolic panel (Completed)   TSH (Completed)   Vitamin B12 (Completed)   POCT urine pregnancy      I have discontinued Ms. Hogate's SEROQUEL XR and norgestimate-ethinyl estradiol. I am also having her maintain her PRESCRIPTION MEDICATION, ELIDEL, chlorpheniramine, fluticasone, amphetamine-dextroamphetamine, SPRINTEC 28, ALPRAZolam, Vitamin D (Ergocalciferol), metroNIDAZOLE, and QUEtiapine.  Meds ordered this encounter  Medications  . QUEtiapine (SEROQUEL) 300 MG tablet    Sig: Take 1 tablet by mouth daily.    CMA served as Neurosurgeon during this visit. History, Physical and Plan performed by medical provider. Documentation and orders reviewed and attested to.  [name], DO  Pt was supposed to return for preg test-- she was admitted to [facility] and preg test done in ER negative

## 2017-03-01 NOTE — Patient Instructions (Signed)
Syncope °Syncope is when you temporarily lose consciousness. Syncope may also be called fainting or passing out. It is caused by a sudden decrease in blood flow to the brain. Even though most causes of syncope are not dangerous, syncope can be a sign of a serious medical problem. Signs that you may be about to faint include: °· Feeling dizzy or light-headed. °· Feeling nauseous. °· Seeing all white or all black in your field of vision. °· Having cold, clammy skin. °If you fainted, get medical help right away.Call your local emergency services (911 in the [place] Do not drive yourself to the hospital. °Follow these instructions at home: °Pay attention to any changes in your symptoms. Take these actions to help with your condition: °· Have someone stay with you until you feel stable. °· Do not drive, use machinery, or play sports until your health care provider says it is okay. °· Keep all follow-up visits as told by your health care provider. This is important. °· If you start to feel like you might faint, lie down right away and raise (elevate) your feet above the level of your heart. Breathe deeply and steadily. Wait until all of the symptoms have passed. °· Drink enough fluid to keep your urine clear or pale yellow. °· If you are taking blood pressure or heart medicine, get up slowly and take several minutes to sit and then stand. This can reduce dizziness. °· Take over-the-counter and prescription medicines only as told by your health care provider. °Get help right away if: °· You have a severe headache. °· You have unusual pain in your chest, abdomen, or back. °· You are bleeding from your mouth or rectum, or you have black or tarry stool. °· You have a very fast or irregular heartbeat (palpitations). °· You have pain with breathing. °· You faint once or repeatedly. °· You have a seizure. °· You are confused. °· You have trouble walking. °· You have severe weakness. °· You have vision problems. °These symptoms  may represent a serious problem that is an emergency. Do not wait to see if your symptoms will go away. Get medical help right away. Call your local emergency services (911 in the [place] Do not drive yourself to the hospital. °This information is not intended to replace advice given to you by your health care provider. Make sure you discuss any questions you have with your health care provider. °Document Released: 07/30/2005 Document Revised: 01/05/2016 Document Reviewed: 04/13/2015 °Elsevier Interactive Patient Education © 2017 Elsevier Inc. ° °

## 2017-03-03 DIAGNOSIS — R55 Syncope and collapse: Secondary | ICD-10-CM

## 2017-03-03 HISTORY — DX: Syncope and collapse: R55

## 2017-03-03 NOTE — Assessment & Plan Note (Signed)
Check labs  May have been from stopping meds suddenly than restarting D/w psych

## 2017-03-27 ENCOUNTER — Other Ambulatory Visit: Payer: Self-pay | Admitting: Family Medicine

## 2017-03-29 ENCOUNTER — Emergency Department (HOSPITAL_COMMUNITY)
Admission: EM | Admit: 2017-03-29 | Discharge: 2017-03-30 | Disposition: A | Discharge status: Other Acute Inpatient Hospital | Payer: BC Managed Care – PPO | Attending: Emergency Medicine | Admitting: Emergency Medicine

## 2017-03-29 ENCOUNTER — Encounter (HOSPITAL_COMMUNITY): Payer: Self-pay | Admitting: Emergency Medicine

## 2017-03-29 DIAGNOSIS — F3164 Bipolar disorder, current episode mixed, severe, with psychotic features: Secondary | ICD-10-CM

## 2017-03-29 DIAGNOSIS — F22 Delusional disorders: Secondary | ICD-10-CM | POA: Diagnosis not present

## 2017-03-29 DIAGNOSIS — F319 Bipolar disorder, unspecified: Secondary | ICD-10-CM | POA: Diagnosis present

## 2017-03-29 DIAGNOSIS — Z79899 Other long term (current) drug therapy: Secondary | ICD-10-CM | POA: Diagnosis not present

## 2017-03-29 DIAGNOSIS — Z046 Encounter for general psychiatric examination, requested by authority: Secondary | ICD-10-CM | POA: Diagnosis present

## 2017-03-29 LAB — BASIC METABOLIC PANEL
Anion gap: 7 (ref 5–15)
BUN: 9 mg/dL (ref 6–20)
CALCIUM: 9.2 mg/dL (ref 8.9–10.3)
CO2: 24 mmol/L (ref 22–32)
CREATININE: 0.66 mg/dL (ref 0.44–1.00)
Chloride: 108 mmol/L (ref 101–111)
Glucose, Bld: 97 mg/dL (ref 65–99)
Potassium: 4.3 mmol/L (ref 3.5–5.1)
SODIUM: 139 mmol/L (ref 135–145)

## 2017-03-29 LAB — I-STAT BETA HCG BLOOD, ED (MC, WL, AP ONLY)

## 2017-03-29 LAB — ETHANOL

## 2017-03-29 LAB — CBC
HCT: 37.2 % (ref 36.0–46.0)
Hemoglobin: 12.5 g/dL (ref 12.0–15.0)
MCH: 29.8 pg (ref 26.0–34.0)
MCHC: 33.6 g/dL (ref 30.0–36.0)
MCV: 88.6 fL (ref 78.0–100.0)
PLATELETS: 305 10*3/uL (ref 150–400)
RBC: 4.2 MIL/uL (ref 3.87–5.11)
RDW: 13.8 % (ref 11.5–15.5)
WBC: 6.6 10*3/uL (ref 4.0–10.5)

## 2017-03-29 MED ORDER — QUETIAPINE FUMARATE 300 MG PO TABS
300.0000 mg | ORAL_TABLET | Freq: Every day | ORAL | Status: DC
  Administered 2017-03-30: 300 mg via ORAL
  Filled 2017-03-29: qty 1

## 2017-03-29 MED ORDER — AMPHETAMINE-DEXTROAMPHET ER 10 MG PO CP24
30.0000 mg | ORAL_CAPSULE | Freq: Every morning | ORAL | Status: DC
  Administered 2017-03-30: 30 mg via ORAL
  Filled 2017-03-29: qty 3

## 2017-03-29 MED ORDER — NORGESTIMATE-ETH ESTRADIOL 0.25-35 MG-MCG PO TABS: 1.0000 | ORAL_TABLET | Freq: Every day | ORAL | Status: DC

## 2017-03-29 MED ORDER — ALPRAZOLAM 0.5 MG PO TABS
0.5000 mg | ORAL_TABLET | Freq: Three times a day (TID) | ORAL | Status: DC | PRN
  Administered 2017-03-29 – 2017-03-30 (×2): 0.5 mg via ORAL
  Filled 2017-03-29 (×2): qty 1

## 2017-03-29 NOTE — ED Notes (Signed)
Pt's sister Aliah Lemelin called [phone] saying that she has a flight at 8 pm and wanted to check in on pt.  Her mother called as well.  PT states she isn't ready to speak to any family at this time.

## 2017-03-29 NOTE — ED Notes (Signed)
SBAR Report received from previous nurse. Pt received calm and visible on unit. Pt denies current SI/ HI, A/V H, depression, anxiety, or pain at this time, and appears otherwise stable and free of distress. Pt reminded of camera surveillance, q 15 min rounds, and rules of the milieu. Will continue to assess. 
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
## 2017-03-29 NOTE — ED Triage Notes (Signed)
Pt via [employer] from home.  Pt's mother called stating pt refused to come for treatment earlier to mother took out IVC papers on her.  Reports inc paranoia and anxiety this week, 'seeing thing and people that aren't there', has been in treatment before but is not taking her medications, and believes that people are following her.  A&Ox4.  Ambulatory w/steady gait.

## 2017-03-29 NOTE — ED Notes (Signed)
Bed: [facility] Expected date:  Expected time:  Means of arrival:  Comments: IVC w/ [employer]

## 2017-03-29 NOTE — ED Provider Notes (Addendum)
[facility]-EMERGENCY DEPT Provider Note   CSN: [id number] Arrival date & time: 03/29/17  1527     History   Chief Complaint No chief complaint on file.   HPI [name] is a 21 y.o. female.  HPI IVC paperwork taken out on patient by mother for increasing paranoia and anxiety with hallucinations. Mother reports noncompliance with her medications. Hx of bipolar disorder. Patient without complaints at this time.   Mother reports recently the pt looked out the window thought a man was a stalker and following her (mother reports the man was just walking in the parking lot).   There was a man by the dumpster walking his dog and she thought the man was trying to get her as well. Mother reports patient can't walk into [employer] without holding her arm. Mother reports she is scared and paranoid.   Increasing her home doses of xanax without MD approval.   Past Medical History:  Diagnosis Date  . ADHD (attention deficit hyperactivity disorder)   . Allergy   . Bipolar disorder (HCC)   . Eczema 12/11/13    Patient Active Problem List   Diagnosis Date Noted  . Syncope 03/03/2017  . STD exposure 05/13/2015  . Preventative health care 03/16/2013  . Bipolar disorder (HCC) 12/30/2012  . ADD (attention deficit disorder) 04/24/2011    No past surgical history on file.  OB History    No data available       Home Medications    Prior to Admission medications   Medication Sig Start Date End Date Taking? Authorizing Provider  ALPRAZolam Prudy Feeler) 0.5 MG tablet  11/26/16   [provider]  amphetamine-dextroamphetamine (ADDERALL XR) 30 MG 24 hr capsule Take 30 mg by mouth every morning. 11/02/15   [provider]  chlorpheniramine (CHLOR-TRIMETON) 4 MG tablet Take 4 mg by mouth 2 (two) times daily as needed for allergies.    [provider]  ELIDEL 1 % cream  02/12/13   [provider]  fluticasone (FLONASE) 50 MCG/ACT nasal spray Place 2 sprays into both  nostrils daily. 05/13/15   [name], DO  metroNIDAZOLE (FLAGYL) 500 MG tablet Take 1 tablet (500 mg total) by mouth 2 (two) times daily. Take for 7 days 12/17/16   Zola Button, Grayling Congress, DO  PRESCRIPTION MEDICATION Apply 1 application topically 2 (two) times daily as needed (itching). triamcinolone and  mixed eucerin 1:1    [provider]  QUEtiapine (SEROQUEL) 300 MG tablet Take 1 tablet by mouth daily. 02/16/17   [provider]  SPRINTEC 28 0.25-35 MG-MCG tablet TAKE 1 TABLET BY MOUTH DAILY. 03/28/17   [name], DO  Vitamin D, Ergocalciferol, (DRISDOL) 50000 units CAPS capsule  11/10/16   [provider]    Family History Family History  Problem Relation Age of Onset  . Asthma Mother   . Stroke Father   . Heart disease Father   . Asthma Father   . Hypertension Father        pulmonary hypertension  . Heart disease Maternal Uncle   . Hypertension Maternal Grandmother   . COPD Maternal Grandmother   . Diabetes Maternal Grandfather   . Stroke Paternal Grandmother   . Diabetes Paternal Grandmother   . Diabetes Paternal Grandfather   . Leukemia Maternal Uncle     Social History Social History  Substance Use Topics  . Smoking status: Never Smoker  . Smokeless tobacco: Never Used  . Alcohol use No  Allergies   Concerta [methylphenidate]; Clindamycin hcl; and Terbinafine hcl   Review of Systems Review of Systems  All other systems reviewed and are negative.    Physical Exam Updated Vital Signs LMP 03/01/2017   Physical Exam  Constitutional: She is oriented to person, place, and time. She appears well-developed and well-nourished. No distress.  HENT:  Head: Normocephalic and atraumatic.  Eyes: EOM are normal.  Neck: Normal range of motion.  Cardiovascular: Normal rate, regular rhythm and normal heart sounds.   Pulmonary/Chest: Effort normal and breath sounds normal.  Abdominal: Soft. She exhibits no distension. There  is no tenderness.  Musculoskeletal: Normal range of motion.  Neurological: She is alert and oriented to person, place, and time.  Skin: Skin is warm and dry.  Psychiatric: She has a normal mood and affect. Judgment normal.  Nursing note and vitals reviewed.    ED Treatments / Results  Labs (all labs ordered are listed, but only abnormal results are displayed) Labs Reviewed  CBC  BASIC METABOLIC PANEL  ETHANOL  RAPID URINE DRUG SCREEN, HOSP PERFORMED  PREGNANCY, URINE  I-STAT BETA HCG BLOOD, ED ([facility], [facility], [facility] ONLY)    EKG  EKG Interpretation None       Radiology No results found.  Procedures Procedures (including critical care time)  Medications Ordered in ED Medications - No data to display   Initial Impression / Assessment and Plan / ED Course  I have reviewed the triage vital signs and the nursing notes.  Pertinent labs & imaging results that were available during my care of the patient were reviewed by me and considered in my medical decision making (see chart for details).     Medically clear. IVC paperwork taken out by mother. Labs and urine pending  Final Clinical Impressions(s) / ED Diagnoses   Final diagnoses:  None    New Prescriptions New Prescriptions   No medications on file     Azalia Bilis, MD 03/29/17 1556    Azalia Bilis, MD 03/29/17 1626

## 2017-03-29 NOTE — ED Notes (Signed)
Per Dr Patria Mane, pt may transfer back to [facility]

## 2017-03-30 ENCOUNTER — Inpatient Hospital Stay (HOSPITAL_COMMUNITY)
Admission: AD | Admit: 2017-03-30 | Discharge: 2017-04-03 | DRG: 885 | Disposition: A | Discharge status: Home / Self Care | Payer: BC Managed Care – PPO | Attending: Psychiatry | Admitting: Psychiatry

## 2017-03-30 ENCOUNTER — Encounter (HOSPITAL_COMMUNITY): Payer: Self-pay | Admitting: General Practice

## 2017-03-30 DIAGNOSIS — Z823 Family history of stroke: Secondary | ICD-10-CM | POA: Diagnosis not present

## 2017-03-30 DIAGNOSIS — R55 Syncope and collapse: Secondary | ICD-10-CM | POA: Diagnosis not present

## 2017-03-30 DIAGNOSIS — G4700 Insomnia, unspecified: Secondary | ICD-10-CM | POA: Diagnosis present

## 2017-03-30 DIAGNOSIS — F3163 Bipolar disorder, current episode mixed, severe, without psychotic features: Secondary | ICD-10-CM | POA: Diagnosis present

## 2017-03-30 DIAGNOSIS — F909 Attention-deficit hyperactivity disorder, unspecified type: Secondary | ICD-10-CM | POA: Diagnosis present

## 2017-03-30 DIAGNOSIS — Z881 Allergy status to other antibiotic agents status: Secondary | ICD-10-CM | POA: Diagnosis not present

## 2017-03-30 DIAGNOSIS — F3164 Bipolar disorder, current episode mixed, severe, with psychotic features: Secondary | ICD-10-CM | POA: Diagnosis not present

## 2017-03-30 DIAGNOSIS — F22 Delusional disorders: Secondary | ICD-10-CM | POA: Diagnosis not present

## 2017-03-30 DIAGNOSIS — Z825 Family history of asthma and other chronic lower respiratory diseases: Secondary | ICD-10-CM

## 2017-03-30 DIAGNOSIS — F419 Anxiety disorder, unspecified: Secondary | ICD-10-CM | POA: Diagnosis present

## 2017-03-30 DIAGNOSIS — Z806 Family history of leukemia: Secondary | ICD-10-CM | POA: Diagnosis not present

## 2017-03-30 DIAGNOSIS — Z833 Family history of diabetes mellitus: Secondary | ICD-10-CM | POA: Diagnosis not present

## 2017-03-30 DIAGNOSIS — F314 Bipolar disorder, current episode depressed, severe, without psychotic features: Secondary | ICD-10-CM

## 2017-03-30 DIAGNOSIS — F39 Unspecified mood [affective] disorder: Secondary | ICD-10-CM | POA: Diagnosis not present

## 2017-03-30 DIAGNOSIS — Z888 Allergy status to other drugs, medicaments and biological substances status: Secondary | ICD-10-CM | POA: Diagnosis not present

## 2017-03-30 DIAGNOSIS — Z8249 Family history of ischemic heart disease and other diseases of the circulatory system: Secondary | ICD-10-CM

## 2017-03-30 DIAGNOSIS — Z9114 Patient's other noncompliance with medication regimen: Secondary | ICD-10-CM | POA: Diagnosis not present

## 2017-03-30 DIAGNOSIS — R5381 Other malaise: Secondary | ICD-10-CM | POA: Diagnosis not present

## 2017-03-30 DIAGNOSIS — Z7951 Long term (current) use of inhaled steroids: Secondary | ICD-10-CM

## 2017-03-30 DIAGNOSIS — F29 Unspecified psychosis not due to a substance or known physiological condition: Secondary | ICD-10-CM | POA: Diagnosis not present

## 2017-03-30 DIAGNOSIS — R5383 Other fatigue: Secondary | ICD-10-CM | POA: Diagnosis not present

## 2017-03-30 HISTORY — DX: Bipolar disorder, current episode depressed, severe, without psychotic features: F31.4

## 2017-03-30 LAB — RAPID URINE DRUG SCREEN, HOSP PERFORMED
Amphetamines: NOT DETECTED
BARBITURATES: NOT DETECTED
Benzodiazepines: POSITIVE — AB
Cocaine: NOT DETECTED
Opiates: NOT DETECTED
Tetrahydrocannabinol: NOT DETECTED

## 2017-03-30 LAB — PREGNANCY, URINE: PREG TEST UR: NEGATIVE

## 2017-03-30 MED ORDER — OLANZAPINE 5 MG PO TBDP
5.0000 mg | ORAL_TABLET | Freq: Two times a day (BID) | ORAL | Status: DC
  Administered 2017-03-30 – 2017-04-01 (×4): 5 mg via ORAL
  Filled 2017-03-30 (×10): qty 1

## 2017-03-30 MED ORDER — NORGESTIMATE-ETH ESTRADIOL 0.25-35 MG-MCG PO TABS: 1.0000 | ORAL_TABLET | Freq: Every day | ORAL | Status: DC

## 2017-03-30 MED ORDER — ALPRAZOLAM 0.5 MG PO TABS
0.5000 mg | ORAL_TABLET | Freq: Three times a day (TID) | ORAL | Status: DC | PRN
  Filled 2017-03-30: qty 1

## 2017-03-30 MED ORDER — QUETIAPINE FUMARATE 300 MG PO TABS
300.0000 mg | ORAL_TABLET | Freq: Every day | ORAL | Status: DC
  Administered 2017-03-31: 300 mg via ORAL
  Filled 2017-03-30 (×3): qty 1

## 2017-03-30 MED ORDER — ALUM & MAG HYDROXIDE-SIMETH 200-200-20 MG/5ML PO SUSP: 30.0000 mL | ORAL | Status: DC | PRN

## 2017-03-30 MED ORDER — HYDROXYZINE HCL 25 MG PO TABS
25.0000 mg | ORAL_TABLET | Freq: Three times a day (TID) | ORAL | Status: DC | PRN
  Administered 2017-03-31 – 2017-04-02 (×3): 25 mg via ORAL
  Filled 2017-03-30 (×3): qty 1

## 2017-03-30 MED ORDER — TRAZODONE HCL 50 MG PO TABS
50.0000 mg | ORAL_TABLET | Freq: Every evening | ORAL | Status: DC | PRN
  Administered 2017-03-31 – 2017-04-02 (×3): 50 mg via ORAL
  Filled 2017-03-30 (×4): qty 1

## 2017-03-30 MED ORDER — ACETAMINOPHEN 325 MG PO TABS: 650.0000 mg | ORAL_TABLET | Freq: Four times a day (QID) | ORAL | Status: DC | PRN

## 2017-03-30 MED ORDER — AMPHETAMINE-DEXTROAMPHET ER 10 MG PO CP24
30.0000 mg | ORAL_CAPSULE | Freq: Every day | ORAL | Status: DC
  Administered 2017-03-31: 30 mg via ORAL
  Filled 2017-03-30: qty 3

## 2017-03-30 MED ORDER — MAGNESIUM HYDROXIDE 400 MG/5ML PO SUSP: 30.0000 mL | Freq: Every day | ORAL | Status: DC | PRN

## 2017-03-30 MED ORDER — OLANZAPINE 5 MG PO TBDP
5.0000 mg | ORAL_TABLET | Freq: Two times a day (BID) | ORAL | Status: DC
  Administered 2017-03-30: 5 mg via ORAL
  Filled 2017-03-30: qty 1

## 2017-03-30 NOTE — Progress Notes (Signed)
D.  Pt pleasant but guarded on approach, denies complaints at this time.  Pt was positive for evening wrap up group, interacting primarily with roommate.  Minimal interaction first night on unit.   Pt denies SI/HI/AVH at this time.  A.  Support and encouragement offered, medication given as ordered  R.  Pt remains safe on the unit,will continue to monitor.

## 2017-03-30 NOTE — ED Notes (Signed)
Pt mother called nurses station requesting information about pt POC. This nurse could not confirm or deny if pt was here d/t no consent to release information. This nurse then went to pt room to offer pt consent to release information form, pt verbalizes to this nurse that she does not want mother involved with POC.

## 2017-03-30 NOTE — Tx Team (Signed)
Initial Treatment Plan 03/30/2017 7:24 PM [name] BJY:[id number]    PATIENT STRESSORS: Marital or family conflict   PATIENT STRENGTHS: General fund of knowledge Physical Health   PATIENT IDENTIFIED PROBLEMS: Paranoia  Bipolar Disorder    Patient reports she would like to work on her anxiety level    Patient reports she wants to work on "steady mood"           DISCHARGE CRITERIA:  Improved stabilization in mood, thinking, and/or behavior Motivation to continue treatment in a less acute level of care Verbal commitment to aftercare and medication compliance  PRELIMINARY DISCHARGE PLAN: Outpatient therapy Return to previous living arrangement  PATIENT/FAMILY INVOLVEMENT: This treatment plan has been presented to and reviewed with the patient, [name].  The patient and family have been given the opportunity to ask questions and make suggestions.  Larina Earthly, RN 03/30/2017, 7:24 PM

## 2017-03-30 NOTE — ED Notes (Signed)
Pt boyfriend at bedside visiting with pt.

## 2017-03-30 NOTE — ED Notes (Signed)
[facility] on unit to transfer pt to [facility] Adult unit per MD order. Personal property given to [employer] for transfer. Pt ambulatory off unit in police custody.  
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
## 2017-03-30 NOTE — BH Assessment (Signed)
[facility] Assessment Progress Note  03/30/17: Patient meets inpatient criteria and has been accepted to [facility] 403-1 at 1530.
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
## 2017-03-31 DIAGNOSIS — R5383 Other fatigue: Secondary | ICD-10-CM

## 2017-03-31 DIAGNOSIS — Z9114 Patient's other noncompliance with medication regimen: Secondary | ICD-10-CM

## 2017-03-31 DIAGNOSIS — R5381 Other malaise: Secondary | ICD-10-CM

## 2017-03-31 MED ORDER — LORAZEPAM 0.5 MG PO TABS
0.5000 mg | ORAL_TABLET | Freq: Two times a day (BID) | ORAL | Status: DC | PRN
  Administered 2017-04-02 (×2): 0.5 mg via ORAL
  Filled 2017-03-31 (×2): qty 1

## 2017-03-31 MED ORDER — AMPHETAMINE-DEXTROAMPHET ER 5 MG PO CP24
15.0000 mg | ORAL_CAPSULE | Freq: Every day | ORAL | Status: DC
  Administered 2017-04-01: 15 mg via ORAL
  Filled 2017-03-31: qty 3
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
## 2017-03-31 NOTE — Progress Notes (Signed)
Adult Psychoeducational Group Note  Date:  03/31/2017 Time:  4:05 AM  Group Topic/Focus:  Wrap-Up Group:   The focus of this group is to help patients review their daily goal of treatment and discuss progress on daily workbooks.  Participation Level:  Did Not Attend  Participation Quality:  Did not attend  Affect:  Did not attend  Cognitive:  Did not attend  Insight: None  Engagement in Group:  Did not attend  Modes of Intervention:  Did not attend  Additional Comments:  Did not attend evening wrap up group.  Felipa Furnace 03/31/2017, 4:05 AM

## 2017-03-31 NOTE — Progress Notes (Signed)
DAR NOTE: Pt present this morning with bright affect and jovial  mood in the unit.  Pt has been in the dayroom with peer. Pt approach the wring at 0950 complaining of dizziness, V/S taken and MD notified. Pt stayed back during lunch due to dizziness.  Pt denies physical pain, took all his meds as scheduled. As per self inventory, pt had a good night sleep, good appetite, normal energy, and good concentration. Pt rate depression at 1, hopeless ness at 0, and anxiety at 3. Pt's safety ensured with 15 minute and environmental checks. Pt currently denies SI/HI and A/V hallucinations. Pt verbally agrees to seek staff if SI/HI or A/VH occurs and to consult with staff before acting on these thoughts. Will continue POC.

## 2017-03-31 NOTE — Progress Notes (Signed)
Pt complained of dizziness and feeling wooziness, V/S taken BP.119/81, P. 120,temp. 98.8 and 02. 100%. Pt provide with fluid and MD made aware. Will continue to monitor.

## 2017-03-31 NOTE — BHH Counselor (Signed)
Clinical Social Work Note  Attempted to do Psychosocial Assessment with pt, but she was in bed, stated she had just taken medication and was feeling "woozy" so it was agreed to wait until later.  Ambrose Mantle, LCSW 03/31/2017, 12:34 PM

## 2017-03-31 NOTE — BHH Group Notes (Signed)
BHH LCSW Group Therapy Note  Date/Time:  03/31/2017 10:00-11:00AM  Type of Therapy and Topic:  Group Therapy:  Healthy and Unhealthy Supports  Participation Level:  Did Not Attend   Description of Group:  Patients in this group were introduced to the idea of adding a variety of healthy supports to address the various needs in their lives. The picture on the front of Sunday's workbook was used to demonstrate why more supports are needed in every patient's life.  Patients identified and described healthy supports versus unhealthy supports in general, then gave examples of each in their own lives.   They discussed what additional healthy supports could be helpful in their recovery and wellness after discharge in order to prevent future hospitalizations.   An emphasis was placed on using counselor, doctor, therapy groups, 12-step groups, and problem-specific support groups to expand supports.  They also worked as a group on developing a specific plan for several patients to deal with unhealthy supports through boundary-setting, psychoeducation with loved ones, and even termination of relationships.   Therapeutic Goals:   1)  discuss importance of adding supports to stay well once out of the hospital  2)  compare healthy versus unhealthy supports and identify some examples of each  3)  generate ideas and descriptions of healthy supports that can be added  4)  offer mutual support about how to address unhealthy supports  5)  encourage active participation in and adherence to discharge plan    Summary of Patient Progress:  N/A   Therapeutic Modalities:   Motivational Interviewing Brief Solution-Focused Therapy  Vann Okerlund Grossman-Orr, LCSW 03/31/2017, 12:22 PM       
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
## 2017-04-01 DIAGNOSIS — F909 Attention-deficit hyperactivity disorder, unspecified type: Secondary | ICD-10-CM

## 2017-04-01 MED ORDER — ARIPIPRAZOLE 10 MG PO TABS
10.0000 mg | ORAL_TABLET | Freq: Every day | ORAL | Status: DC
  Administered 2017-04-02 – 2017-04-03 (×2): 10 mg via ORAL
  Filled 2017-04-01 (×4): qty 1

## 2017-04-01 NOTE — Progress Notes (Addendum)
[facility] MD Progress Note  04/01/2017 8:48 AM [name]  MRN:  [id number] Subjective:  Patient states " I guess I am feeling all right". She reports that she feels she is being followed and harassed by two females, including standing at the window, staring at her at her mother's home, following her in two different cars, and states she is concerned they might have followed her to hospital as well . Denies medication side effects . Objective : I have discussed case with treatment team and have met with patient. Patient is a 21 year old female, who has been diagnosed with Bipolar Disorder in the past, and who was admitted under commitment generated by her mother reporting patient has been paranoid, scared, and that she threatened sister with knife . Patient denies having threatened sister, and states " I had a knife for self defense", and ruminates about two women stalking her . States " I don't know who they are ".  With her express consent I spoke with her mother, who reports patient has appeared increasingly paranoid, particularly over the last two weeks, has history of medication non compliance- had been prescribed Geodon most recently but had not started it. Patient states she has been compliant with Adderall, prescribed for ADHD ,but UDS negative for stimulants . Mother states that symptoms are not new and she had been tried on Seroquel briefly in the past, but reiterated paranoia seems worse recently and is currently major aspect of her symptoms.  On unit patient calm, polite on approach, vaguely anxious, denies SI, and states she is hoping for discharge soon.  Principal Problem: Bipolar 1 disorder, mixed, severe ([place]) Diagnosis:   Patient Active Problem List   Diagnosis Date Noted  . Bipolar 1 disorder, mixed, severe ([place]) [F31.63] 03/30/2017  . Syncope [R55] 03/03/2017  . STD exposure [Z20.2] 05/13/2015  . Preventative health care [Z00.00] 03/16/2013  . Bipolar disorder ([place]) [F31.9]  12/30/2012  . ADD (attention deficit disorder) [F98.8] 04/24/2011   Total Time spent with patient: 25 minutes  Past Medical History:  Past Medical History:  Diagnosis Date  . ADHD (attention deficit hyperactivity disorder)   . Allergy   . Bipolar disorder ([place])   . Eczema 12/11/13   History reviewed. No pertinent surgical history. Family History:  Family History  Problem Relation Age of Onset  . Asthma Mother   . Stroke Father   . Heart disease Father   . Asthma Father   . Hypertension Father        pulmonary hypertension  . Heart disease Maternal Uncle   . Hypertension Maternal Grandmother   . COPD Maternal Grandmother   . Diabetes Maternal Grandfather   . Stroke Paternal Grandmother   . Diabetes Paternal Grandmother   . Diabetes Paternal Grandfather   . Leukemia Maternal Uncle    Social History:  History  Alcohol Use  . Yes    Comment: occ     History  Drug Use No    Social History   Social History  . Marital status: Single    Spouse name: N/A  . Number of children: N/A  . Years of education: N/A   Social History Main Topics  . Smoking status: Never Smoker  . Smokeless tobacco: Never Used  . Alcohol use Yes     Comment: occ  . Drug use: No  . Sexual activity: Yes    Birth control/ protection: Condom   Other Topics Concern  . None   Social History  Narrative  . None   Additional Social History:   Sleep: Good  Appetite:  Good  Current Medications: Current Facility-Administered Medications  Medication Dose Route Frequency Provider Last Rate Last Dose  . acetaminophen (TYLENOL) tablet 650 mg  650 mg Oral Q6H PRN Ethelene Hal, NP      . alum & mag hydroxide-simeth (MAALOX/MYLANTA) 200-200-20 MG/5ML suspension 30 mL  30 mL Oral Q4H PRN Ethelene Hal, NP      . amphetamine-dextroamphetamine (ADDERALL XR) 24 hr capsule 15 mg  15 mg Oral QAC breakfast Norman Clay, MD   15 mg at 04/01/17 0263  . hydrOXYzine (ATARAX/VISTARIL) tablet 25  mg  25 mg Oral TID PRN Ethelene Hal, NP   25 mg at 03/31/17 2107  . LORazepam (ATIVAN) tablet 0.5 mg  0.5 mg Oral BID PRN Norman Clay, MD      . magnesium hydroxide (MILK OF MAGNESIA) suspension 30 mL  30 mL Oral Daily PRN Ethelene Hal, NP      . norgestimate-ethinyl estradiol (ORTHO-CYCLEN,SPRINTEC,PREVIFEM) 0.25-35 MG-MCG tablet 1 tablet  1 tablet Oral QHS Ethelene Hal, NP      . OLANZapine zydis (ZYPREXA) disintegrating tablet 5 mg  5 mg Oral BID Ethelene Hal, NP   5 mg at 04/01/17 0758  . traZODone (DESYREL) tablet 50 mg  50 mg Oral QHS PRN Ethelene Hal, NP   50 mg at 03/31/17 2107    Lab Results: No results found for this or any previous visit (from the past 43 hour(s)).  Blood Alcohol level:  Lab Results  Component Value Date   ETH <5 78/58/8502    Metabolic Disorder Labs: No results found for: HGBA1C, MPG No results found for: PROLACTIN Lab Results  Component Value Date   CHOL 148 11/02/2016   TRIG 77 11/02/2016   HDL 64 11/02/2016   CHOLHDL 2.3 11/02/2016   VLDL 15 11/02/2016   LDLCALC 69 11/02/2016   LDLCALC 94 08/02/2014    Physical Findings: AIMS: Facial and Oral Movements Muscles of Facial Expression: None, normal Lips and Perioral Area: None, normal Jaw: None, normal Tongue: None, normal,Extremity Movements Upper (arms, wrists, hands, fingers): None, normal Lower (legs, knees, ankles, toes): None, normal, Trunk Movements Neck, shoulders, hips: None, normal, Overall Severity Severity of abnormal movements (highest score from questions above): None, normal Incapacitation due to abnormal movements: None, normal Patient's awareness of abnormal movements (rate only patient's report): No Awareness, Dental Status Current problems with teeth and/or dentures?: No Does patient usually wear dentures?: No  CIWA:    COWS:     Musculoskeletal: Strength & Muscle Tone: within normal limits Gait & Station: normal Patient  leans: N/A  Psychiatric Specialty Exam: Physical Exam  ROS denies headache, no chest pain, no shortness of breath, no vomiting, no rash   Blood pressure 115/73, pulse (!) 101, temperature 98.5 F (36.9 C), temperature source Oral, resp. rate 18, height '5\' 4"'$  (1.626 m), weight 66.2 kg (146 lb), SpO2 100 %.Body mass index is 25.06 kg/m.  General Appearance: Well Groomed  Eye Contact:  Good  Speech:  Normal Rate  Volume:  Decreased  Mood:  reports she is feeling "OK", presents vageuly depressed   Affect:  constricted, but does smile briefly at times   Thought Process:  Linear and Descriptions of Associations: Intact  Orientation:  Other:  fully alert and attentive  Thought Content:  denies hallucinations, persecuory ideations as above, not internally preoccupied   Suicidal Thoughts:  No  denies suicidal or self injurious ideations, denies homicidal or violent ideations , also specifically denies any HI or violent ideations towards her sister or other family member   Homicidal Thoughts:  No  Memory:  recent and remote grossly intact   Judgement:  Fair  Insight:  Fair  Psychomotor Activity:  Decreased  Concentration:  Concentration: Good and Attention Span: Good  Recall:  Good  Fund of Knowledge:  Good  Language:  Good  Akathisia:  Negative  Handed:  Right  AIMS (if indicated):     Assets:  Desire for Improvement Resilience  ADL's:  Intact  Cognition:  WNL  Sleep:  Number of Hours: 6.25   Assessment - patient is 21 years old, she has been diagnosed with Bipolar Disorder and with ADHD in the past. She presents under commitment generated by mother due to increased paranoid ideations . Patient reports persecutory ideations, feels that two females are stalking her, and admits she has been fearful, reluctant to go outside alone due to fear . She had not been taking prescribed Geodon prior to admission , is currently on Zyprexa- we discussed medication side effects and she is worried about  weight gain potential. We discussed options, will D/C Zyprexa, start Abilify.  Will discontinue Adderall, as could be a contributor to psychotic symptoms.    Treatment Plan Summary: Daily contact with patient to assess and evaluate symptoms and progress in treatment, Medication management, Plan inpatient admission  and medications as below Encourage group and milieu participation to work on coping skills and symptom reduction D/C Zyprexa- see rationale above  D/C Adderall- see rationale above - once further stabilized would consider Atomoxetine as an option Start Abilify 10 mgrs QDAY for mood disorder history and for psychosis Continue Vistaril 25 mgrs Q 8 hours PRN for anxiety  Continue Trazodone 50 mgrs QHS PRN for insomnia  Treatment team working on disposition planning options    Jenne Campus, MD 04/01/2017, 8:48 AM

## 2017-04-01 NOTE — [facility] Group Notes (Signed)
LCSW Group Therapy Note   04/01/2017 1:15pm   Type of Therapy and Topic:  Group Therapy:  Overcoming Obstacles   Participation Level:  Minimal   Description of Group:    In this group patients will be encouraged to explore what they see as obstacles to their own wellness and recovery. They will be guided to discuss their thoughts, feelings, and behaviors related to these obstacles. The group will process together ways to cope with barriers, with attention given to specific choices patients can make. Each patient will be challenged to identify changes they are motivated to make in order to overcome their obstacles. This group will be process-oriented, with patients participating in exploration of their own experiences as well as giving and receiving support and challenge from other group members.   Therapeutic Goals: 1. Patient will identify personal and current obstacles as they relate to admission. 2. Patient will identify barriers that currently interfere with their wellness or overcoming obstacles.  3. Patient will identify feelings, thought process and behaviors related to these barriers. 4. Patient will identify two changes they are willing to make to overcome these obstacles:      Summary of Patient Progress Pt was attentive and engaged but participated minimally in group discussion. She identified anxiety as her primary obstacle. She was able to report that she finds ways to manage it in the community so she can function appropriately but describes it being overwhelming as of late.      Therapeutic Modalities:   Cognitive Behavioral Therapy Solution Focused Therapy Motivational Interviewing Relapse Prevention Therapy  Verdene Lennert, LCSW 04/01/2017 3:29 PM

## 2017-04-01 NOTE — Plan of Care (Signed)
Problem: Activity: Goal: Sleeping patterns will improve Outcome: Progressing Patient is sleeping well as evidenced by 6 hours of sleep recorded each night.

## 2017-04-01 NOTE — Tx Team (Signed)
Interdisciplinary Treatment and Diagnostic Plan Update  04/01/2017 Time of Session: 9:30am [name] MRN: [id number]  Principal Diagnosis: Bipolar 1 disorder, mixed, severe ([facility])  Secondary Diagnoses: Principal Problem:   Bipolar 1 disorder, mixed, severe ([facility])   Current Medications:  Current Facility-Administered Medications  Medication Dose Route Frequency Provider Last Rate Last Dose  . acetaminophen (TYLENOL) tablet 650 mg  650 mg Oral Q6H PRN Laveda Abbe, NP      . alum & mag hydroxide-simeth (MAALOX/MYLANTA) 200-200-20 MG/5ML suspension 30 mL  30 mL Oral Q4H PRN Laveda Abbe, NP      . amphetamine-dextroamphetamine (ADDERALL XR) 24 hr capsule 15 mg  15 mg Oral QAC breakfast Neysa Hotter, MD   15 mg at 04/01/17 7829  . hydrOXYzine (ATARAX/VISTARIL) tablet 25 mg  25 mg Oral TID PRN Laveda Abbe, NP   25 mg at 03/31/17 2107  . LORazepam (ATIVAN) tablet 0.5 mg  0.5 mg Oral BID PRN Neysa Hotter, MD      . magnesium hydroxide (MILK OF MAGNESIA) suspension 30 mL  30 mL Oral Daily PRN Laveda Abbe, NP      . norgestimate-ethinyl estradiol (ORTHO-CYCLEN,SPRINTEC,PREVIFEM) 0.25-35 MG-MCG tablet 1 tablet  1 tablet Oral QHS Laveda Abbe, NP      . OLANZapine zydis (ZYPREXA) disintegrating tablet 5 mg  5 mg Oral BID Laveda Abbe, NP   5 mg at 04/01/17 0758  . traZODone (DESYREL) tablet 50 mg  50 mg Oral QHS PRN Laveda Abbe, NP   50 mg at 03/31/17 2107    PTA Medications: Prescriptions Prior to Admission  Medication Sig Dispense Refill Last Dose  . ALPRAZolam (XANAX) 0.5 MG tablet Take 0.5 mg by mouth at bedtime.    Past Week at Unknown time  . amphetamine-dextroamphetamine (ADDERALL XR) 30 MG 24 hr capsule Take 30 mg by mouth every morning.  0 unknown  . chlorpheniramine (CHLOR-TRIMETON) 4 MG tablet Take 4 mg by mouth 2 (two) times daily as needed for allergies.   unknown  . fluticasone (FLONASE) 50 MCG/ACT nasal spray  Place 2 sprays into both nostrils daily. 16 g 6 unknown  . metroNIDAZOLE (FLAGYL) 500 MG tablet Take 1 tablet (500 mg total) by mouth 2 (two) times daily. Take for 7 days (Patient not taking: Reported on 03/29/2017) 14 tablet 0 Completed Course at Unknown time  . PRESCRIPTION MEDICATION Apply 1 application topically 2 (two) times daily as needed (itching). triamcinolone and  mixed eucerin 1:1   Past Month at Unknown time  . SPRINTEC 28 0.25-35 MG-MCG tablet TAKE 1 TABLET BY MOUTH DAILY. 28 tablet 2 Past Week at Unknown time  . Vitamin D, Ergocalciferol, (DRISDOL) 50000 units CAPS capsule Take 50,000 Units by mouth every 7 (seven) days.    unknown  . ziprasidone (GEODON) 20 MG capsule Take 20 mg by mouth every evening.    Past Week at Unknown time    Treatment Modalities: Medication Management, Group therapy, Case management,  1 to 1 session with clinician, Psychoeducation, Recreational therapy.  Patient Stressors: Marital or family conflict  Patient Strengths: General fund of knowledge Physical Health  Physician Treatment Plan for Primary Diagnosis: Bipolar 1 disorder, mixed, severe (HCC) Long Term Goal(s): Improvement in symptoms so as ready for discharge  Short Term Goals: Ability to identify and develop effective coping behaviors will improve Compliance with prescribed medications will improve Ability to identify and develop effective coping behaviors will improve Compliance with prescribed medications will improve  Medication Management: Evaluate patient's response, side effects, and tolerance of medication regimen.  Therapeutic Interventions: 1 to 1 sessions, Unit Group sessions and Medication administration.  Evaluation of Outcomes: Progressing  Physician Treatment Plan for Secondary Diagnosis: Principal Problem:   Bipolar 1 disorder, mixed, severe (HCC)   Long Term Goal(s): Improvement in symptoms so as ready for discharge  Short Term Goals: Ability to identify and develop  effective coping behaviors will improve Compliance with prescribed medications will improve Ability to identify and develop effective coping behaviors will improve Compliance with prescribed medications will improve  Medication Management: Evaluate patient's response, side effects, and tolerance of medication regimen.  Therapeutic Interventions: 1 to 1 sessions, Unit Group sessions and Medication administration.  Evaluation of Outcomes: Progressing   RN Treatment Plan for Primary Diagnosis: Bipolar 1 disorder, mixed, severe (HCC) Long Term Goal(s): Knowledge of disease and therapeutic regimen to maintain health will improve  Short Term Goals: Ability to remain free from injury will improve, Ability to participate in decision making will improve, Ability to identify and develop effective coping behaviors will improve and Compliance with prescribed medications will improve  Medication Management: RN will administer medications as ordered by provider, will assess and evaluate patient's response and provide education to patient for prescribed medication. RN will report any adverse and/or side effects to prescribing provider.  Therapeutic Interventions: 1 on 1 counseling sessions, Psychoeducation, Medication administration, Evaluate responses to treatment, Monitor vital signs and CBGs as ordered, Perform/monitor CIWA, COWS, AIMS and Fall Risk screenings as ordered, Perform wound care treatments as ordered.  Evaluation of Outcomes: Progressing   LCSW Treatment Plan for Primary Diagnosis: Bipolar 1 disorder, mixed, severe (HCC) Long Term Goal(s): Safe transition to appropriate next level of care at discharge, Engage patient in therapeutic group addressing interpersonal concerns.  Short Term Goals: Engage patient in aftercare planning with referrals and resources, Identify triggers associated with mental health/substance abuse issues and Increase skills for wellness and recovery  Therapeutic  Interventions: Assess for all discharge needs, 1 to 1 time with Social worker, Explore available resources and support systems, Assess for adequacy in community support network, Educate family and significant other(s) on suicide prevention, Complete Psychosocial Assessment, Interpersonal group therapy.  Evaluation of Outcomes: Progressing   Progress in Treatment: Attending groups: No Participating in groups: No Taking medication as prescribed: Yes, MD continues to assess for medication changes as needed Toleration medication: Yes, no side effects reported at this time Family/Significant other contact made: No, CSW attempting to make contact with boyfriend Patient understands diagnosis: Continuing to assess Discussing patient identified problems/goals with staff: Yes Medical problems stabilized or resolved: Yes Denies suicidal/homicidal ideation: Yes Issues/concerns per patient self-inventory: None Other: N/A  New problem(s) identified: None identified at this time.   New Short Term/Long Term Goal(s): "manage my anxiety better and work on keeping my moods more stable"  Discharge Plan or Barriers: Pt will return home and follow-up with outpatient services at [facility]. Pt requests to schedule her own therapy appointment  Reason for Continuation of Hospitalization: Anxiety Depression Medication stabilization Suicidal ideation  Estimated Length of Stay: 2-4 days; est DC date 8/23  Attendees: Patient: [name] 04/01/2017  9:15 AM  Physician: Dr. Jama Flavors, MD 04/01/2017  9:15 AM  Nursing: Vesta Mixer, RN 04/01/2017  9:15 AM  RN Care Manager: Onnie Boer, RN 04/01/2017  9:15 AM  Social Worker: Vernie Shanks, LCSW 04/01/2017  9:15 AM  Recreational Therapist:  04/01/2017  9:15 AM  Other: Armandina Stammer, NP;  Reola Calkins, NP 04/01/2017  9:15 AM  Other:  04/01/2017  9:15 AM  Other: 04/01/2017  9:15 AM    Scribe for Treatment Team: Verdene Lennert, LCSW 04/01/2017 9:15  AM

## 2017-04-01 NOTE — Progress Notes (Signed)
Recreation Therapy Notes  Date: 04/01/17 Time: 0930 Location: [address] Dayroom  Group Topic: Stress Management  Goal Area(s) Addresses:  Patient will verbalize importance of using healthy stress management.  Patient will identify positive emotions associated with healthy stress management.   Intervention: Stress Management  Activity :  Peaceful Waves.  LRT introduced the stress management technique of guided imagery.  LRT played the sounds of waves at the beach from Youtube and read a script to allow patients to visualize being at the beach at sunrise.  Patients were to follow along as LRT read script to engage in the activity.    Education:  Stress Management, Discharge Planning.   Education Outcome: Acknowledges edcuation/In group clarification offered/Needs additional education  Clinical Observations/Feedback: Pt did not attend group.   Chrislyn Seedorf, LRT/CTRS         Vikram Tillett A 04/01/2017 11:18 AM 

## 2017-04-01 NOTE — Progress Notes (Signed)
D: Patient has bright affect; she denies any depressive symptoms.  She is compliant with her medications.  Her goal today is to "reach a point where there is zero anxiety."  She met with treatment team and discussed her goals while she is here. She hopes to develop some effective coping skills.  She is attending groups and participating in her treatment.  Patient denies any thoughts of self harm.  She rates her anxiety as a 4.  She is sleeping and eating well; her energy level is good and her concentration is good.   A: Continue to monitor medication management and MD orders.  Safety checks completed every 15 minutes per protocol.  Offer support and encouragement as needed. R: Patient is receptive to staff; her behavior is appropriate.

## 2017-04-01 NOTE — Progress Notes (Signed)
DAR Note: Pt at the time of assessment was flat and withdrawn room-remained in room. Pt at the time denied any form of pain, SI, HI, AVH, anxiety or depression; "I am ready to go to be; my sister was here today and she made my day." Medications offered as prescribed. All patient's questions and concerns addressed. Support, encouragement, and safe environment provide. Pt was med compliant. Safety checks continue.

## 2017-04-02 DIAGNOSIS — F22 Delusional disorders: Secondary | ICD-10-CM

## 2017-04-02 DIAGNOSIS — F3163 Bipolar disorder, current episode mixed, severe, without psychotic features: Principal | ICD-10-CM

## 2017-04-02 DIAGNOSIS — F29 Unspecified psychosis not due to a substance or known physiological condition: Secondary | ICD-10-CM

## 2017-04-02 DIAGNOSIS — G4700 Insomnia, unspecified: Secondary | ICD-10-CM

## 2017-04-02 DIAGNOSIS — F39 Unspecified mood [affective] disorder: Secondary | ICD-10-CM

## 2017-04-02 DIAGNOSIS — F419 Anxiety disorder, unspecified: Secondary | ICD-10-CM

## 2017-04-02 DIAGNOSIS — R55 Syncope and collapse: Secondary | ICD-10-CM

## 2017-04-02 NOTE — Progress Notes (Addendum)
Pt complained of severe anxiety; "my heart is pounding very fast; I don't know what's happening." V/S-B/P 122/66, O2 99% RA, HR 91, R 20. Pt got PRN Ativan 0.5mg .

## 2017-04-02 NOTE — Progress Notes (Signed)
[facility] MD Progress Note  04/02/2017 3:13 PM [name]  MRN:  [id number]  Subjective: [name] reports, "I'm doing much better today. My anxiety is at level 1 today. I'm sleeping well".  Objective : I have discussed case with treatment team and have met with patient. Patient is a 21 year old female, who has been diagnosed with Bipolar Disorder in the past, and who was admitted under commitment generated by her mother reporting patient has been paranoid, scared, and that she threatened sister with knife . Patient denies having threatened sister, and states " I had a knife for self defense", and ruminates about two women stalking her . States " I don't know who they are ".  With her express consent I spoke with her mother, who reports patient has appeared increasingly paranoid, particularly over the last two weeks, has history of medication non compliance- had been prescribed Geodon most recently but had not started it. Patient states she has been compliant with Adderall, prescribed for ADHD ,but UDS negative for stimulants . Mother states that symptoms are not new and she had been tried on Seroquel briefly in the past, but reiterated paranoia seems worse recently and is currently major aspect of her symptoms.  On unit patient calm, polite on approach, vaguely anxious, denies SI, and states she is hoping for discharge soon.   Principal Problem: Bipolar 1 disorder, mixed, severe ([place]) Diagnosis:   Patient Active Problem List   Diagnosis Date Noted  . Bipolar 1 disorder, mixed, severe ([place]) [F31.63] 03/30/2017  . Syncope [R55] 03/03/2017  . STD exposure [Z20.2] 05/13/2015  . Preventative health care [Z00.00] 03/16/2013  . Bipolar disorder ([place]) [F31.9] 12/30/2012  . ADD (attention deficit disorder) [F98.8] 04/24/2011   Total Time spent with patient: 15 minutes  Past Medical History:  Past Medical History:  Diagnosis Date  . ADHD (attention deficit hyperactivity disorder)   . Allergy   .  Bipolar disorder ([place])   . Eczema 12/11/13   History reviewed. No pertinent surgical history. Family History:  Family History  Problem Relation Age of Onset  . Asthma Mother   . Stroke Father   . Heart disease Father   . Asthma Father   . Hypertension Father        pulmonary hypertension  . Heart disease Maternal Uncle   . Hypertension Maternal Grandmother   . COPD Maternal Grandmother   . Diabetes Maternal Grandfather   . Stroke Paternal Grandmother   . Diabetes Paternal Grandmother   . Diabetes Paternal Grandfather   . Leukemia Maternal Uncle    Social History:  History  Alcohol Use  . Yes    Comment: occ     History  Drug Use No    Social History   Social History  . Marital status: Single    Spouse name: N/A  . Number of children: N/A  . Years of education: N/A   Social History Main Topics  . Smoking status: Never Smoker  . Smokeless tobacco: Never Used  . Alcohol use Yes     Comment: occ  . Drug use: No  . Sexual activity: Yes    Birth control/ protection: Condom   Other Topics Concern  . None   Social History Narrative  . None   Additional Social History:   Sleep: Good  Appetite:  Good  Current Medications: Current Facility-Administered Medications  Medication Dose Route Frequency Provider Last Rate Last Dose  . acetaminophen (TYLENOL) tablet 650 mg  650 mg  Oral Q6H PRN Laveda Abbe, NP      . alum & mag hydroxide-simeth (MAALOX/MYLANTA) 200-200-20 MG/5ML suspension 30 mL  30 mL Oral Q4H PRN Laveda Abbe, NP      . ARIPiprazole (ABILIFY) tablet 10 mg  10 mg Oral Daily Cobos, Rockey Situ, MD   10 mg at 04/02/17 0753  . hydrOXYzine (ATARAX/VISTARIL) tablet 25 mg  25 mg Oral TID PRN Laveda Abbe, NP   25 mg at 04/01/17 2158  . LORazepam (ATIVAN) tablet 0.5 mg  0.5 mg Oral BID PRN Neysa Hotter, MD   0.5 mg at 04/02/17 0012  . magnesium hydroxide (MILK OF MAGNESIA) suspension 30 mL  30 mL Oral Daily PRN Laveda Abbe,  NP      . norgestimate-ethinyl estradiol (ORTHO-CYCLEN,SPRINTEC,PREVIFEM) 0.25-35 MG-MCG tablet 1 tablet  1 tablet Oral QHS Laveda Abbe, NP      . traZODone (DESYREL) tablet 50 mg  50 mg Oral QHS PRN Laveda Abbe, NP   50 mg at 04/01/17 2158    Lab Results: No results found for this or any previous visit (from the past 48 hour(s)).  Blood Alcohol level:  Lab Results  Component Value Date   ETH <5 03/29/2017   Metabolic Disorder Labs: No results found for: HGBA1C, MPG No results found for: PROLACTIN Lab Results  Component Value Date   CHOL 148 11/02/2016   TRIG 77 11/02/2016   HDL 64 11/02/2016   CHOLHDL 2.3 11/02/2016   VLDL 15 11/02/2016   LDLCALC 69 11/02/2016   LDLCALC 94 08/02/2014   Physical Findings: AIMS: Facial and Oral Movements Muscles of Facial Expression: None, normal Lips and Perioral Area: None, normal Jaw: None, normal Tongue: None, normal,Extremity Movements Upper (arms, wrists, hands, fingers): None, normal Lower (legs, knees, ankles, toes): None, normal, Trunk Movements Neck, shoulders, hips: None, normal, Overall Severity Severity of abnormal movements (highest score from questions above): None, normal Incapacitation due to abnormal movements: None, normal Patient's awareness of abnormal movements (rate only patient's report): No Awareness, Dental Status Current problems with teeth and/or dentures?: No Does patient usually wear dentures?: No  CIWA:    COWS:     Musculoskeletal: Strength & Muscle Tone: within normal limits Gait & Station: normal Patient leans: N/A  Psychiatric Specialty Exam: Physical Exam  ROS denies headache, no chest pain, no shortness of breath, no vomiting, no rash   Blood pressure 133/79, pulse (!) 120, temperature 98.3 F (36.8 C), temperature source Oral, resp. rate 20, height 5\' 4"  (1.626 m), weight 66.2 kg (146 lb), SpO2 99 %.Body mass index is 25.06 kg/m.  General Appearance: Well Groomed  Eye  Contact:  Good  Speech:  Normal Rate  Volume:  Decreased  Mood:  reports she is feeling "OK", presents vageuly depressed   Affect:  constricted, but does smile briefly at times   Thought Process:  Linear and Descriptions of Associations: Intact  Orientation:  Other:  fully alert and attentive  Thought Content:  denies hallucinations, persecuory ideations as above, not internally preoccupied   Suicidal Thoughts:  No denies suicidal or self injurious ideations, denies homicidal or violent ideations , also specifically denies any HI or violent ideations towards her sister or other family member   Homicidal Thoughts:  No  Memory:  recent and remote grossly intact   Judgement:  Fair  Insight:  Fair  Psychomotor Activity:  Decreased  Concentration:  Concentration: Good and Attention Span: Good  Recall:  Good  Fund  of Knowledge:  Good  Language:  Good  Akathisia:  Negative  Handed:  Right  AIMS (if indicated):     Assets:  Desire for Improvement Resilience  ADL's:  Intact  Cognition:  WNL  Sleep:  Number of Hours: 4.25   Assessment - Patient is 21 years old, she has been diagnosed with Bipolar Disorder and with ADHD in the past. She presents under commitment generated by mother due to increased paranoid ideations . Patient reports persecutory ideations, feels that two females are stalking her, and admits she has been fearful, reluctant to go outside alone due to fear . She had not been taking prescribed Geodon prior to admission , is currently on Zyprexa- we discussed medication side effects and she is worried about weight gain potential. We discussed options, will D/C Zyprexa, start Abilify.  Will discontinue Adderall, as could be a contributor to psychotic symptoms.   Treatment Plan Summary: Daily contact with patient to assess and evaluate symptoms and progress in treatment, Medication management, Plan inpatient admission  and medications as below.  Will continue today 08//21/18 plan as  below except where it is noted.  Encourage group and milieu participation to work on coping skills and symptom reduction  -D/C'ed Zyprexa on 04-01-17- see rationale above   -D/C'ed Adderall on 08-20- see rationale above - once further stabilized would consider Atomoxetine as an option  -Continue Abilify 10 mgrs QDAY for mood disorder history and for psychosis  -Continue Vistaril 25 mgrs Q 8 hours PRN for anxiety   -Continue Trazodone 50 mgrs QHS PRN for insomnia   Treatment team working on disposition planning options   Jaclyn Slates, NP, PMHNP, FNP-BC 04/02/2017, 3:13 PMPatient ID: [name], female   DOB: Feb 20, 1996, 21 y.o.   MRN: [id number]

## 2017-04-02 NOTE — Progress Notes (Signed)
D: Patient was tearful with sad, depressed mood.  She appears anxious and states, "that medication I took yesterday made me tremulous."  Patient states she could not take the zyprexa due to "tremors."  She was changed to abilify today and tolerated well.  Her goal today is to "relax."  She rates her depression as a 4; hopelessness as a 5; anxiety as a 9.  Patient states she is having continuous anxiety and requested "xanax."  Explained to her that she had some vistaril and she was agreeable to that.  Did not offer the lorazepam, as nursing did not feel her symptoms warranted it at this time.  She denies any thoughts of self harm.  She has been observed in the day room interacting with her peers.  Patient continues to exhibit some paranoia; she is guarded with staff and talks in a low whisper when interacting. A: Continue to monitor medication management and MD orders.  Safety checks completed every 15 minutes per protocol.  Offer support and encouragement as needed. R: Patient is receptive to staff; her behavior is appropriate.

## 2017-04-02 NOTE — Progress Notes (Signed)
DAR Note: Pt at the time of assessment was flat and withdrawn to self even when in the dayroom. Pt at the time denied any form of pain, SI, HI, AVH, anxiety or depression; "I think I am ready to go home; everything feel fine. All patient's questions and concerns addressed. Support, encouragement, and safe environment provide. Pt was med compliant. Safety checks continue.

## 2017-04-02 NOTE — Plan of Care (Signed)
Problem: Coping: Goal: Ability to verbalize frustrations and anger appropriately will improve Outcome: Progressing Patient is able to verbalize her frustration about her medications.  She is frustrated that one of the medications made her "tremulous."  She continues to verbalize ongoing anxiety.

## 2017-04-02 NOTE — Progress Notes (Signed)
Recreation Therapy Notes  Animal-Assisted Activity (AAA) Program Checklist/Progress Notes Patient Eligibility Criteria Checklist & Daily Group note for Rec TxIntervention  Date: 08.21.2018 Time: 2:45pm Location: [address] Dayroom   AAA/T Program Assumption of Risk Form signed by Patient/ or Parent Legal Guardian Yes  Patient is free of allergies or sever asthma Yes  Patient reports no fear of animals Yes  Patient reports no history of cruelty to animals Yes  Patient understands his/her participation is voluntary Yes  Patient washes hands before animal contact Yes  Patient washes hands after animal contact Yes  Behavioral Response: Appropriate   Education:Hand Washing, Appropriate Animal Interaction   Education Outcome: Acknowledges education.   Clinical Observations/Feedback: Patient attended session and interacted appropriately with therapy dog and peers.   [name], LRT/CTRS       [name] 04/02/2017 3:15 PM 

## 2017-04-02 NOTE — [facility] Group Notes (Signed)
[facility] Mental Health Association Group Therapy 04/02/2017 1:15pm  Type of Therapy: Mental Health Association Presentation  Participation Level: Active  Participation Quality: Attentive  Affect: Appropriate  Cognitive: Oriented  Insight: Developing/Improving  Engagement in Therapy: Engaged  Modes of Intervention: Discussion, Education and Socialization  Summary of Progress/Problems: Mental Health Association (MHA) Speaker came to talk about his personal journey with substance abuse and addiction. The pt processed ways by which to relate to the speaker. MHA speaker provided handouts and educational information pertaining to groups and services offered by the [facility]. Pt was engaged in speaker's presentation and was receptive to resources provided.    Vernie Shanks, LCSW 04/02/2017 1:44 PM

## 2017-04-03 DIAGNOSIS — F314 Bipolar disorder, current episode depressed, severe, without psychotic features: Secondary | ICD-10-CM

## 2017-04-03 MED ORDER — ARIPIPRAZOLE 10 MG PO TABS: 10.0000 mg | ORAL_TABLET | Freq: Every day | ORAL | 0 refills | Status: DC

## 2017-04-03 MED ORDER — LORAZEPAM 0.5 MG PO TABS: 0.5000 mg | ORAL_TABLET | Freq: Two times a day (BID) | ORAL | 0 refills | Status: DC | PRN

## 2017-04-03 MED ORDER — TRAZODONE HCL 50 MG PO TABS: 50.0000 mg | ORAL_TABLET | Freq: Every evening | ORAL | 0 refills | Status: DC | PRN

## 2017-04-03 MED ORDER — HYDROXYZINE HCL 25 MG PO TABS: 25.0000 mg | ORAL_TABLET | Freq: Three times a day (TID) | ORAL | 0 refills | Status: DC | PRN

## 2017-04-03 MED ORDER — NORGESTIMATE-ETH ESTRADIOL 0.25-35 MG-MCG PO TABS: 1.0000 | ORAL_TABLET | Freq: Every day | ORAL | 0 refills | Status: DC

## 2017-04-03 NOTE — [facility] Group Notes (Signed)
[facility] LCSW Group Therapy Note  Date/Time: 04/03/17 at 1:30pm  Type of Therapy and Topic:  Group Therapy:  Introduction to You  Participation Level:  Active/ Engaged  Description of Group:    Group started off with an icebreaker that challenges each participant to be more self-aware. Participants were then asked to complete a worksheet titled "Introduction to You" where they discussed what they feel lead them into the hospital, triggers associated, physical symptoms they encountered thoughts they tend to have, ways they have coped, and goals they hoped to accomplish before discharge. Participants were then asked to provide feedback and engage in peer support.    Therapeutic Goals: 1. Patient will identify what lead them to the hospital. 2. Patient will discuss triggers associated.  3. Patient will discuss the physical symptoms encountered when feeling this way. 4. Patient will discuss thoughts they tend to have when feeling this way. 5. Patient will discuss ways they have coped with this both negatively and positively.  6. Patient will discuss what they hope to accomplish before discharging from the hospital.    Summary of Patient Progress  Patient actively participated in group on today. Patient was able to identify the triggers related to her admission. Patient spoke about the barriers faced when dealing with this behavior or emotion. Patient was able to identify ways that were unhealthy and healthy to cope with the situation. Patient was able to stated her goals for discharge and what she hopes to accomplish. Patient provided great feedback to her peers and was receptive to the feedback provided by CSW. No concerns to report for this group.   Purcell Jungbluth, LCSWA Clinical Social Worker [facility] Ph: [phone] 

## 2017-04-03 NOTE — Progress Notes (Signed)
  [facility] Adult Case Management Discharge Plan :  Will you be returning to the same living situation after discharge:  No. Pt is discharging to mother's house At discharge, do you have transportation home?: Yes,  Pt sister/mother to pick up Do you have the ability to pay for your medications: Yes,  Pt provided with prescriptions  Release of information consent forms completed and in the chart;  Patient's signature needed at discharge.  Patient to Follow up at: Follow-up Information    Milagros Evener, MD Follow up on 04/23/2017.   Specialty:  Psychiatry Why:  at 2:30pm for medication management with Dr. Greta Doom information: [address]Tyson [address] [place] [address] [phone]        Pt declines referral for therapy; requests to schedule this on her own. Follow up.           Next level of care provider has access to [facility] Link:no  Safety Planning and Suicide Prevention discussed: Yes,  with Pt; 2 unsuccessful attempts with boyfriend  Have you used any form of tobacco in the last 30 days? (Cigarettes, Smokeless Tobacco, Cigars, and/or Pipes): No  Has patient been referred to the Quitline?: N/A patient is not a smoker  Patient has been referred for addiction treatment: Yes  Verdene Lennert, LCSW 04/03/2017, 3:23 PM

## 2017-04-03 NOTE — Progress Notes (Signed)
D: Patient is less anxious and tearful today.  She denies any depressive symptoms; she rates her hopelessness as a 1; anxiety as a 3.  Her goal today is to "decrease my anxiety from a 3 to a 0."  Patient has not had any panic attacks.  She still complains of some "tremors."  She is sleeping and eating well; her energy level is normal and her concentration is poor.  She denies any thoughts of self harm.  Patient states that she feels that "her heart is racing."  She is focused on discharge for today.  She does not appear to be responding to internal stimuli.  She is preoccupied with side effects from her medication and exhibits some paranoid behavior.  She talks in a whisper and is difficult to understand.  She is attending groups and participating. A: Continue to monitor medication management and MD orders.  Safety checks completed every 15 minutes per protocol.  Offer support and encouragement as needed. R: Patient is receptive to staff; her behavior is appropriate.
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
## 2017-04-03 NOTE — [facility] Suicide Risk Assessment (Signed)
[facility] INPATIENT:  Family/Significant Other Suicide Prevention Education  Suicide Prevention Education:  Contact Attempts: [name], Pt's boyfriend [phone], (name of family member/significant other) has been identified by the patient as the family member/significant other with whom the patient will be residing, and identified as the person(s) who will aid the patient in the event of a mental health crisis.  With written consent from the patient, two attempts were made to provide suicide prevention education, prior to and/or following the patient's discharge.  We were unsuccessful in providing suicide prevention education.  A suicide education pamphlet was given to the patient to share with family/significant other.  Date and time of first attempt: 04/01/17 @ 3:40pm Date and time of second attempt: 04/02/17 @ 11:15am  [name] 04/03/2017, 3:21 PM

## 2017-04-03 NOTE — Progress Notes (Signed)
Discharge note:  Patient discharged home per MD order.  She will follow up with her outpatient provider Dr. [name].  Patient denies any thoughts of self harm.  Reviewed AVS/transition record with patient and she indicated understanding.  Received all personal belongings from unit and locker.  Patient left ambulatory with prescriptions.  Patient was picked up by her family.

## 2017-04-03 NOTE — Progress Notes (Signed)
DAR Note: Pt at the time of assessment was flat and withdrawn to self even when in the dayroom. Pt at the time endorsed moderate anxiety. Pt denied pain, SI, HI, AVH or depression; "I should be going home tomorrow; I think I am ready." All patient's questions and concerns addressed. Support, encouragement, and safe environment provide. Pt was med compliant. Safety checks continue. Pt attended wrap-up group.

## 2017-04-30 ENCOUNTER — Telehealth: Payer: Self-pay | Admitting: Family Medicine

## 2017-04-30 ENCOUNTER — Encounter (HOSPITAL_BASED_OUTPATIENT_CLINIC_OR_DEPARTMENT_OTHER): Payer: Self-pay

## 2017-04-30 ENCOUNTER — Emergency Department (HOSPITAL_BASED_OUTPATIENT_CLINIC_OR_DEPARTMENT_OTHER)
Admission: EM | Admit: 2017-04-30 | Discharge: 2017-05-01 | Disposition: A | Discharge status: Home / Self Care | Payer: BC Managed Care – PPO | Attending: Emergency Medicine | Admitting: Emergency Medicine

## 2017-04-30 DIAGNOSIS — T50905A Adverse effect of unspecified drugs, medicaments and biological substances, initial encounter: Secondary | ICD-10-CM

## 2017-04-30 DIAGNOSIS — T43501A Poisoning by unspecified antipsychotics and neuroleptics, accidental (unintentional), initial encounter: Secondary | ICD-10-CM | POA: Diagnosis not present

## 2017-04-30 DIAGNOSIS — F909 Attention-deficit hyperactivity disorder, unspecified type: Secondary | ICD-10-CM | POA: Diagnosis not present

## 2017-04-30 DIAGNOSIS — R251 Tremor, unspecified: Secondary | ICD-10-CM | POA: Diagnosis present

## 2017-04-30 DIAGNOSIS — F319 Bipolar disorder, unspecified: Secondary | ICD-10-CM | POA: Insufficient documentation

## 2017-04-30 HISTORY — DX: Anxiety disorder, unspecified: F41.9

## 2017-04-30 NOTE — Telephone Encounter (Signed)
Patient Name: [name]  DOB: [birth year]    Initial Comment Caller states her daughter was having symptoms of slurred speech, she had a lot of saliva, she was shaking and her heart was beating fast. She had overdosed on her anxiety medication. Her psychiatrist told her to call the primary doctor. She is still having all the same symptoms and the mom would like to know what to do.    Nurse Assessment  Nurse: Renaldo Fiddler, RN, Raynelle Fanning Date/Time Lamount Cohen Time): 04/30/2017 5:07:20 PM  Confirm and document reason for call. If symptomatic, describe symptoms. ---Caller states her daughter was having symptoms of slurred speech, she had a lot of saliva, she was shaking and her heart was beating fast. She saw her psychiatrist on Monday because her sx were getting worse. She was told she was "over medicated" and stopped some of her medication. She was given an rx for Valium but was not filled until today. Her sx have not gone. States she feels like her heart is going to beat out of her chest and she is shaking . Flower is in the back ground talking, both parties on speaker phone  Does the patient have any new or worsening symptoms? ---Yes  Will a triage be completed? ---Yes  Related visit to physician within the last 2 weeks? ---Yes  Does the PT have any chronic conditions? (i.e. diabetes, asthma, etc.) ---Yes  List chronic conditions. ---Anxiety Disorder, Bipolar/Depression, Allergies  Is the patient pregnant or possibly pregnant? (Ask all females between the ages of 82-55) ---No  Is this a behavioral health or substance abuse call? ---No     Guidelines    Guideline Title Affirmed Question Affirmed Notes  Heart Rate and Heartbeat Questions Dizziness, lightheadedness, or weakness    Final Disposition User   Go to ED Now Renaldo Fiddler, RN, Raynelle Fanning    Referrals  [facility] - ED   Disagree/Comply: Comply

## 2017-04-30 NOTE — ED Triage Notes (Addendum)
Pt c/o tremors-started while she was at [facility] during 8/18 admn-d/c home with trazodone, ativan and geodon-pt has been seen x 2 f/u appts with Dr [name] -last being yesterday-dx with parkinsonian syndrome with plans to change meds-pt NAD-steady gait-tremors/shaking noted-mother with pt

## 2017-04-30 NOTE — ED Notes (Addendum)
Pt c/o CP-EKG to be done in triage

## 2017-05-01 MED ORDER — DIAZEPAM 5 MG PO TABS
5.0000 mg | ORAL_TABLET | Freq: Once | ORAL | Status: AC
  Administered 2017-05-01: 5 mg via ORAL

## 2017-05-01 NOTE — ED Provider Notes (Signed)
[facility] Provider Note: Jaclyn Dell, MD, FACEP  CSN: [id number] MRN: [id number] ARRIVAL: 04/30/17 at 2158 ROOM: [facility]04/[facility]04   CHIEF COMPLAINT  Tremors   HISTORY OF PRESENT ILLNESS  05/01/17 1:36 AM [name] is a 21 y.o. female with a history of bipolar disorder. She was involuntarily committed last month for paranoid behavior. She was discharged home on multiple medications. Her mother states that she got confused about which medications she was supposed to take and took some combination of Abilify, Geodon, Seroquel and trazodone over the weekend while with her boyfriend (it is now Wednesday morning). When her mother saw her Monday she was restless and tremulous. She was seen by her psychiatrist yesterday who told her that she had been taking too many medications and instructed her to take Abilify only. She also prescribed Valium 5 milligrams twice daily as needed for her tremors. She took one dose of Valium yesterday afternoon about 3 PM without significant relief. She continues to be restless with generalized tremors.    Past Medical History:  Diagnosis Date  . ADHD (attention deficit hyperactivity disorder)   . Allergy   . Anxiety   . Bipolar disorder (HCC)   . Eczema 12/11/13    History reviewed. No pertinent surgical history.  Family History  Problem Relation Age of Onset  . Asthma Mother   . Stroke Father   . Heart disease Father   . Asthma Father   . Hypertension Father        pulmonary hypertension  . Heart disease Maternal Uncle   . Hypertension Maternal Grandmother   . COPD Maternal Grandmother   . Diabetes Maternal Grandfather   . Stroke Paternal Grandmother   . Diabetes Paternal Grandmother   . Diabetes Paternal Grandfather   . Leukemia Maternal Uncle     Social History  Substance Use Topics  . Smoking status: Never Smoker  . Smokeless tobacco: Never Used  . Alcohol use Yes     Comment: occ    Prior to Admission medications     Medication Sig Start Date End Date Taking? Authorizing Provider  Cholecalciferol (VITAMIN D PO) Take by mouth.   Yes [provider]  diazepam (VALIUM) 5 MG tablet Take 5 mg by mouth 2 (two) times daily.   Yes [provider]  Fluticasone Propionate (FLONASE NA) Place into the nose.   Yes [provider]  ARIPiprazole (ABILIFY) 10 MG tablet Take 1 tablet (10 mg total) by mouth daily. For mood control 04/04/17   Armandina Stammer I, NP  norgestimate-ethinyl estradiol (SPRINTEC 28) 0.25-35 MG-MCG tablet Take 1 tablet by mouth daily. Birth control method. 04/03/17   Sanjuana Kava, NP    Allergies Concerta [methylphenidate]; Clindamycin hcl; and Terbinafine hcl   REVIEW OF SYSTEMS  Negative except as noted here or in the History of Present Illness.   PHYSICAL EXAMINATION  Initial Vital Signs Blood pressure 137/90, pulse 86, temperature 98.3 F (36.8 C), resp. rate (!) 28, height  (1.626 m), weight 65 kg (143 lb 4.8 oz), last menstrual period 04/07/2017, SpO2 100 %.  Examination General: Well-developed, well-nourished female in no acute distress; appearance consistent with age of record HENT: normocephalic; atraumatic Eyes: pupils equal, round and reactive to light; extraocular muscles intact Neck: supple Heart: regular rate and rhythm Lungs: clear to auscultation bilaterally Abdomen: soft; nondistended; nontender; bowel sounds present Extremities: No deformity; full range of motion; pulses normal Neurologic: Awake, alert; motor function intact in all extremities  and symmetric; no facial droop; generalized tremor Skin: Warm and dry Psychiatric: Anxious; mildly pressured speech   RESULTS  Summary of this visit's results, reviewed by myself:   EKG Interpretation  Date/Time:  Tuesday April 30 2017 22:19:46 EDT Ventricular Rate:  95 PR Interval:  118 QRS Duration: 78 QT Interval:  338 QTC Calculation: 424 R Axis:   77 Text Interpretation:  Normal  sinus rhythm with sinus arrhythmia Normal ECG No significant change since last tracing Confirmed by Drema Pry [id number]) on 04/30/2017 10:24:59 PM      Laboratory Studies: No results found for this or any previous visit (from the past 24 hour(s)). Imaging Studies: No results found.  ED COURSE  Nursing notes and initial vitals signs, including pulse oximetry, reviewed.  Vitals:   04/30/17 2203 04/30/17 2206  BP:  137/90  Pulse:  86  Resp:  (!) 28  Temp:  98.3 F (36.8 C)  SpO2:  100%  Weight: 65 kg (143 lb 4.8 oz)   Height:  (1.626 m)    3:06 AM Patient resting comfortably after bowel a.m. 5 milligrams orally. Mother states tremors have improved. Mother was advised to continue using the Valium as prescribed.  PROCEDURES    ED DIAGNOSES     ICD-10-CM   1. Adverse effect of drug, initial encounter T88.Cathi Roan, MD 05/01/17 [phone]

## 2017-05-01 NOTE — ED Notes (Signed)
Mother gave pt valium from home prescription.

## 2017-05-02 ENCOUNTER — Encounter: Payer: Self-pay | Admitting: Family Medicine

## 2017-05-02 ENCOUNTER — Ambulatory Visit (INDEPENDENT_AMBULATORY_CARE_PROVIDER_SITE_OTHER): Payer: BC Managed Care – PPO | Admitting: Family Medicine

## 2017-05-02 VITALS — BP 114/76 | HR 108 | Temp 97.5°F | Ht 64.0 in | Wt 141.6 lb

## 2017-05-02 DIAGNOSIS — F10231 Alcohol dependence with withdrawal delirium: Secondary | ICD-10-CM | POA: Diagnosis not present

## 2017-05-02 DIAGNOSIS — F319 Bipolar disorder, unspecified: Secondary | ICD-10-CM

## 2017-05-02 DIAGNOSIS — F10931 Alcohol use, unspecified with withdrawal delirium: Secondary | ICD-10-CM

## 2017-05-02 LAB — COMPREHENSIVE METABOLIC PANEL
ALBUMIN: 4.3 g/dL (ref 3.5–5.2)
ALT: 13 U/L (ref 0–35)
AST: 15 U/L (ref 0–37)
Alkaline Phosphatase: 36 U/L — ABNORMAL LOW (ref 39–117)
BILIRUBIN TOTAL: 0.7 mg/dL (ref 0.2–1.2)
BUN: 14 mg/dL (ref 6–23)
CO2: 28 mEq/L (ref 19–32)
CREATININE: 0.74 mg/dL (ref 0.40–1.20)
Calcium: 9.6 mg/dL (ref 8.4–10.5)
Chloride: 102 mEq/L (ref 96–112)
GFR: 124.81 mL/min (ref >60.00)
Glucose, Bld: 91 mg/dL (ref 70–99)
Potassium: 3.8 mEq/L (ref 3.5–5.1)
SODIUM: 136 meq/L (ref 135–145)
TOTAL PROTEIN: 7.6 g/dL (ref 6.0–8.3)

## 2017-05-02 LAB — CBC WITH DIFFERENTIAL/PLATELET
BASOS ABS: 0 10*3/uL (ref 0.0–0.1)
Basophils Relative: 0.7 % (ref 0.0–3.0)
EOS ABS: 0.1 10*3/uL (ref 0.0–0.7)
Eosinophils Relative: 1.3 % (ref 0.0–5.0)
HEMATOCRIT: 39.5 % (ref 36.0–46.0)
Hemoglobin: 13.1 g/dL (ref 12.0–15.0)
LYMPHS PCT: 31.6 % (ref 12.0–46.0)
Lymphs Abs: 2 10*3/uL (ref 0.7–4.0)
MCHC: 33.1 g/dL (ref 30.0–36.0)
MCV: 90.8 fl (ref 78.0–100.0)
MONO ABS: 0.6 10*3/uL (ref 0.1–1.0)
Monocytes Relative: 8.9 % (ref 3.0–12.0)
Neutro Abs: 3.7 10*3/uL (ref 1.4–7.7)
Neutrophils Relative %: 57.5 % (ref 43.0–77.0)
Platelets: 336 10*3/uL (ref 150.0–400.0)
RBC: 4.35 Mil/uL (ref 3.87–5.11)
RDW: 13.7 % (ref 11.5–15.5)
WBC: 6.4 10*3/uL (ref 4.0–10.5)

## 2017-05-02 LAB — POCT URINE PREGNANCY: PREG TEST UR: NEGATIVE

## 2017-05-02 NOTE — Progress Notes (Signed)
Patient ID: [name], female    DOB: 1996-07-25  Age: 21 y.o. MRN: [id number]    Subjective:  Subjective  HPI [name] presents for f/u er -- she was taking too much medication--- seroquel, geodan, abilify all together.  Her psych took her off everything but abilify and gave her valium but she thinks the valium was making her shake.  She actually starts shaking when the valium is wearing off  Review of Systems  Constitutional: Negative for appetite change, diaphoresis, fatigue and unexpected weight change.  Eyes: Negative for pain, redness and visual disturbance.  Respiratory: Negative for cough, chest tightness, shortness of breath and wheezing.   Cardiovascular: Negative for chest pain, palpitations and leg swelling.  Endocrine: Negative for cold intolerance, heat intolerance, polydipsia, polyphagia and polyuria.  Genitourinary: Negative for difficulty urinating, dysuria and frequency.  Neurological: Positive for tremors. Negative for dizziness, light-headedness, numbness and headaches.  Psychiatric/Behavioral: Positive for agitation and sleep disturbance. The patient is nervous/anxious.     History Past Medical History:  Diagnosis Date  . ADHD (attention deficit hyperactivity disorder)   . Allergy   . Anxiety   . Bipolar disorder (HCC)   . Eczema 12/11/13    She has no past surgical history on file.   Her family history includes Asthma in her father and mother; COPD in her maternal grandmother; Diabetes in her maternal grandfather, paternal grandfather, and paternal grandmother; Heart disease in her father and maternal uncle; Hypertension in her father and maternal grandmother; Leukemia in her maternal uncle; Stroke in her father and paternal grandmother.She reports that she has never smoked. She has never used smokeless tobacco. She reports that she drinks alcohol. She reports that she does not use drugs.  Current Outpatient Prescriptions on File Prior to Visit    Medication Sig Dispense Refill  . ARIPiprazole (ABILIFY) 10 MG tablet Take 1 tablet (10 mg total) by mouth daily. For mood control 30 tablet 0  . Cholecalciferol (VITAMIN D PO) Take by mouth.    . Fluticasone Propionate (FLONASE NA) Place into the nose.    . norgestimate-ethinyl estradiol (SPRINTEC 28) 0.25-35 MG-MCG tablet Take 1 tablet by mouth daily. Birth control method. 28 tablet 0  . diazepam (VALIUM) 5 MG tablet Take 5 mg by mouth 2 (two) times daily.     No current facility-administered medications on file prior to visit.      Objective:  Objective  Physical Exam  Constitutional: She is oriented to person, place, and time. She appears well-developed and well-nourished.  HENT:  Head: Normocephalic and atraumatic.  Eyes: Conjunctivae and EOM are normal.  Neck: Normal range of motion. Neck supple. No JVD present. Carotid bruit is not present. No thyromegaly present.  Cardiovascular: Normal rate, regular rhythm and normal heart sounds.   No murmur heard. Pulmonary/Chest: Effort normal and breath sounds normal. No respiratory distress. She has no wheezes. She has no rales. She exhibits no tenderness.  Musculoskeletal: She exhibits no edema.  Neurological: She is alert and oriented to person, place, and time.  Psychiatric: Thought content normal. Her mood appears anxious. She is agitated. She does not exhibit a depressed mood.  Nursing note and vitals reviewed.  BP 114/76 (BP Location: Left Arm, Patient Position: Sitting, Cuff Size: Normal)   Pulse (!) 108   Temp (!) 97.5 F (36.4 C) (Oral)   Ht  (1.626 m)   Wt 141 lb 9.6 oz (64.2 kg)   LMP 04/07/2017   SpO2 97%  BMI 24.31 kg/m  Wt Readings from Last 3 Encounters:  05/02/17 141 lb 9.6 oz (64.2 kg)  04/30/17 143 lb 4.8 oz (65 kg)  03/30/17 146 lb (66.2 kg)     Lab Results  Component Value Date   WBC 6.4 05/02/2017   HGB 13.1 05/02/2017   HCT 39.5 05/02/2017   PLT 336.0 05/02/2017   GLUCOSE 91 05/02/2017    CHOL 148 11/02/2016   TRIG 77 11/02/2016   HDL 64 11/02/2016   LDLCALC 69 11/02/2016   ALT 13 05/02/2017   AST 15 05/02/2017   NA 136 05/02/2017   K 3.8 05/02/2017   CL 102 05/02/2017   CREATININE 0.74 05/02/2017   BUN 14 05/02/2017   CO2 28 05/02/2017   TSH 1.20 03/01/2017   MICROALBUR 0.6 08/02/2014    No results found.   Assessment & Plan:  Plan  I am having [name] maintain her ARIPiprazole, norgestimate-ethinyl estradiol, diazepam, Cholecalciferol (VITAMIN D PO), Fluticasone Propionate (FLONASE NA), and Vitamin D (Ergocalciferol).  Meds ordered this encounter  Medications  . Vitamin D, Ergocalciferol, (DRISDOL) 50000 units CAPS capsule    Sig: Take 50,000 Units by mouth once a week.    Refill:  12    Problem List Items Addressed This Visit    None    Visit Diagnoses    Bipolar 1 disorder (HCC)    -  Primary   Relevant Orders   POCT urine pregnancy (Completed)   CBC with Differential/Platelet (Completed)   Comprehensive metabolic panel (Completed)   Pain Mgmt, Profile 8 w/Conf, U   Delirium tremens (HCC)       Relevant Orders   POCT urine pregnancy (Completed)   CBC with Differential/Platelet (Completed)   Comprehensive metabolic panel (Completed)   Pain Mgmt, Profile 8 w/Conf, U    con't abilify and valium F/u psych Gave mom brochure for [facility]  D/w mom and pt a service dog as well  Follow-up: Return if symptoms worsen or fail to improve.  [name], DO

## 2017-05-02 NOTE — Patient Instructions (Signed)
Bipolar 1 Disorder Bipolar 1 disorder is a mental health disorder in which a person has episodes of emotional highs (mania), and may also have episodes of emotional lows (depression) in addition to highs. Bipolar 1 disorder is different from other bipolar disorders because it involves extreme manic episodes. These episodes last at least one week or involve symptoms that are so severe that hospitalization is needed to keep the person safe. What increases the risk? The cause of this condition is not known. However, certain factors make you more likely to have bipolar disorder, such as:  Having a family member with the disorder.  An imbalance of certain chemicals in the brain (neurotransmitters).  Stress, such as illness, financial problems, or a death.  Certain conditions that affect the brain or spinal cord (neurologic conditions).  Brain injury (trauma).  Having another mental health disorder, such as: ? Obsessive compulsive disorder. ? Schizophrenia.  What are the signs or symptoms? Symptoms of mania include:  Very high self-esteem or self-confidence.  Decreased need for sleep.  Unusual talkativeness or feeling a need to keep talking. Speech may be very fast. It may seem like you cannot stop talking.  Racing thoughts or constant talking, with quick shifts between topics that may or may not be related (flight of ideas).  Decreased ability to focus or concentrate.  Increased purposeful activity, such as work, studies, or social activity.  Increased nonproductive activity. This could be pacing, squirming and fidgeting, or finger and toe tapping.  Impulsive behavior and poor judgment. This may result in high-risk activities, such as having unprotected sex or spending a lot of money.  Symptoms of depression include:  Feeling sad, hopeless, or helpless.  Frequent or uncontrollable crying.  Lack of feeling or caring about anything.  Sleeping too much.  Moving more slowly  than usual.  Not being able to enjoy things you used to enjoy.  Wanting to be alone all the time.  Feeling guilty or worthless.  Lack of energy or motivation.  Trouble concentrating or remembering.  Trouble making decisions.  Increased appetite.  Thoughts of death, or the desire to harm yourself.  Sometimes, you may have a mixed mood. This means having symptoms of depression and mania. Stress can make symptoms worse. How is this diagnosed? To diagnose bipolar disorder, your health care provider may ask about your:  Emotional episodes.  Medical history.  Alcohol and drug use. This includes prescription medicines. Certain medical conditions and substances can cause symptoms that seem like bipolar disorder (secondary bipolar disorder).  How is this treated? Bipolar disorder is a long-term (chronic) illness. It is best controlled with ongoing (continuous) treatment rather than treatment only when symptoms occur. Treatment may include:  Medicine. Medicine can be prescribed by a provider who specializes in treating mental disorders (psychiatrist). ? Medicines called mood stabilizers are usually prescribed. ? If symptoms occur even while taking a mood stabilizer, other medicines may be added.  Psychotherapy. Some forms of talk therapy, such as cognitive-behavioral therapy (CBT), can provide support, education, and guidance.  Coping methods, such as journaling or relaxation exercises. These may include: ? Yoga. ? Meditation. ? Deep breathing.  Lifestyle changes, such as: ? Limiting alcohol and drug use. ? Exercising regularly. ? Getting plenty of sleep. ? Making healthy eating choices.  A combination of medicine, talk therapy, and coping methods is best. A procedure in which electricity is applied to the brain through the scalp (electroconvulsive therapy) may be used in cases of severe mania when medicine   and psychotherapy work too slowly or do not work. Follow these  instructions at home: Activity   Return to your normal activities as told by your health care provider.  Find activities that you enjoy, and make time to do them.  Exercise regularly as told by your health care provider. Lifestyle  Limit alcohol intake to no more than 1 drink a day for nonpregnant women and 2 drinks a day for men. One drink equals 12 oz of beer, 5 oz of wine, or 1 oz of hard liquor.  Follow a set schedule for eating and sleeping.  Eat a balanced diet that includes fresh fruits and vegetables, whole grains, low-fat dairy, and lean meat.  Get 7-8 hours of sleep each night. General instructions  Take over-the-counter and prescription medicines only as told by your health care provider.  Think about joining a support group. Your health care provider may be able to recommend a support group.  Talk with your family and loved ones about your treatment goals and how they can help.  Keep all follow-up visits as told by your health care provider. This is important. Where to find more information: For more information about bipolar disorder, visit the following websites:  National Alliance on Mental Illness: www.nami.org  [place]. National Institute of Mental Health: www.nimh.nih.gov  Contact a health care provider if:  Your symptoms get worse.  You have side effects from your medicine, and they get worse.  You have trouble sleeping.  You have trouble doing daily activities.  You feel unsafe in your surroundings.  You are dealing with substance abuse. Get help right away if:  You have new symptoms.  You have thoughts about harming yourself.  You self-harm. This information is not intended to replace advice given to you by your health care provider. Make sure you discuss any questions you have with your health care provider. Document Released: 11/05/2000 Document Revised: 03/25/2016 Document Reviewed: 03/29/2016 Elsevier Interactive Patient Education  2018  Elsevier Inc.  

## 2017-05-06 LAB — PAIN MGMT, PROFILE 8 W/CONF, U
6 Acetylmorphine: NEGATIVE ng/mL (ref <10)
ALCOHOL METABOLITES: NEGATIVE ng/mL (ref <500)
ALPHAHYDROXYALPRAZOLAM: NEGATIVE ng/mL (ref <25)
ALPHAHYDROXYMIDAZOLAM: NEGATIVE ng/mL (ref <50)
AMINOCLONAZEPAM: NEGATIVE ng/mL (ref <25)
Alphahydroxytriazolam: NEGATIVE ng/mL (ref <50)
Amphetamines: NEGATIVE ng/mL (ref <500)
BUPRENORPHINE, URINE: NEGATIVE ng/mL (ref <5)
Benzodiazepines: POSITIVE ng/mL — AB (ref <100)
COCAINE METABOLITE: NEGATIVE ng/mL (ref <150)
CREATININE: 302.5 mg/dL
Hydroxyethylflurazepam: NEGATIVE ng/mL (ref <50)
LORAZEPAM: NEGATIVE ng/mL (ref <50)
MDMA: NEGATIVE ng/mL (ref <500)
Marijuana Metabolite: NEGATIVE ng/mL (ref <20)
Nordiazepam: 375 ng/mL — ABNORMAL HIGH (ref <50)
Opiates: NEGATIVE ng/mL (ref <100)
Oxazepam: 209 ng/mL — ABNORMAL HIGH (ref <50)
Oxidant: NEGATIVE ug/mL (ref <200)
Oxycodone: NEGATIVE ng/mL (ref <100)
PH: 6.05 (ref 4.5–9.0)

## 2017-08-19 ENCOUNTER — Encounter: Payer: Self-pay | Admitting: Family

## 2017-08-19 ENCOUNTER — Ambulatory Visit: Payer: BC Managed Care – PPO | Admitting: Family

## 2017-08-19 VITALS — BP 114/68 | HR 84 | Temp 97.9°F | Resp 16 | Ht 64.0 in | Wt 151.0 lb

## 2017-08-19 DIAGNOSIS — J069 Acute upper respiratory infection, unspecified: Secondary | ICD-10-CM | POA: Diagnosis not present

## 2017-08-19 DIAGNOSIS — B9789 Other viral agents as the cause of diseases classified elsewhere: Secondary | ICD-10-CM

## 2017-08-19 MED ORDER — BENZONATATE 100 MG PO CAPS: 100.0000 mg | ORAL_CAPSULE | Freq: Three times a day (TID) | ORAL | 0 refills | Status: DC | PRN

## 2017-08-19 NOTE — Patient Instructions (Signed)
  For congestion you may use mucinex. For cough you may use tessalon (sent to pharmacy) For fever/aches- you may use tylenol and motrin as needed. If symptoms worsen or if not improved in 3-4 days please let us know.  Please follow up with psychiatry for your adderall refill.

## 2017-08-19 NOTE — Progress Notes (Signed)
Subjective:    Patient ID: [name], female    DOB: Jun 15, 1996, 22 y.o.   MRN: [id number]  HPI  [name] is a 22 yr old female who presents today with chief complaint of cough. + sick contact. Temp 99.4 earlier today. No OTC measure.  Had mild sore throat which has improved.  Some yellow nasal drainage.   Review of Systems    see HPI  Past Medical History:  Diagnosis Date  . ADHD (attention deficit hyperactivity disorder)   . Allergy   . Anxiety   . Bipolar disorder (HCC)   . Eczema 12/11/13     Social History   Socioeconomic History  . Marital status: Single    Spouse name: Not on file  . Number of children: Not on file  . Years of education: Not on file  . Highest education level: Not on file  Social Needs  . Financial resource strain: Not on file  . Food insecurity - worry: Not on file  . Food insecurity - inability: Not on file  . Transportation needs - medical: Not on file  . Transportation needs - non-medical: Not on file  Occupational History  . Not on file  Tobacco Use  . Smoking status: Never Smoker  . Smokeless tobacco: Never Used  Substance and Sexual Activity  . Alcohol use: Yes    Comment: occ  . Drug use: No  . Sexual activity: Not on file  Other Topics Concern  . Not on file  Social History Narrative  . Not on file    No past surgical history on file.  Family History  Problem Relation Age of Onset  . Asthma Mother   . Stroke Father   . Heart disease Father   . Asthma Father   . Hypertension Father        pulmonary hypertension  . Heart disease Maternal Uncle   . Hypertension Maternal Grandmother   . COPD Maternal Grandmother   . Diabetes Maternal Grandfather   . Stroke Paternal Grandmother   . Diabetes Paternal Grandmother   . Diabetes Paternal Grandfather   . Leukemia Maternal Uncle     Allergies  Allergen Reactions  . Concerta [Methylphenidate] Other (See Comments)    hallucinations  . Clindamycin Hcl Rash  .  Terbinafine Hcl Nausea And Vomiting    Current Outpatient Medications on File Prior to Visit  Medication Sig Dispense Refill  . Cholecalciferol (VITAMIN D PO) Take by mouth.    . Fluticasone Propionate (FLONASE NA) Place into the nose.    . norgestimate-ethinyl estradiol (SPRINTEC 28) 0.25-35 MG-MCG tablet Take 1 tablet by mouth daily. Birth control method. 28 tablet 0  . Vitamin D, Ergocalciferol, (DRISDOL) 50000 units CAPS capsule Take 50,000 Units by mouth once a week.  12  . diazepam (VALIUM) 5 MG tablet Take 5 mg by mouth 2 (two) times daily.     No current facility-administered medications on file prior to visit.     BP 114/68 (BP Location: Left Arm, Patient Position: Sitting, Cuff Size: Small)   Pulse 84   Temp 97.9 F (36.6 C) (Oral)   Resp 16   Ht 5\' 4"  (1.626 m)   Wt 151 lb (68.5 kg)   LMP 08/05/2017   SpO2 99%   BMI 25.92 kg/m    Objective:   Physical Exam  Constitutional: She is oriented to person, place, and time. She appears well-developed and well-nourished.  HENT:  Head: Atraumatic.  Right Ear:  Tympanic membrane and ear canal normal.  Left Ear: Tympanic membrane and ear canal normal.  Mouth/Throat: No oropharyngeal exudate, posterior oropharyngeal edema or posterior oropharyngeal erythema.  Cardiovascular: Normal rate, regular rhythm and normal heart sounds.  No murmur heard. Pulmonary/Chest: Effort normal and breath sounds normal. No respiratory distress. She has no wheezes.  Musculoskeletal: She exhibits no edema.  Neurological: She is alert and oriented to person, place, and time.  Psychiatric: She has a normal mood and affect. Her behavior is normal. Judgment and thought content normal.          Assessment & Plan:  Viral URI with cough- advised pt as follows:  For congestion you may use mucinex. For cough you may use tessalon (sent to pharmacy) For fever/aches- you may use tylenol and motrin as needed. If symptoms worsen or if not improved in  3-4 days please let us know.

## 2017-08-22 ENCOUNTER — Ambulatory Visit: Payer: Self-pay | Admitting: Family Medicine

## 2017-08-22 DIAGNOSIS — Z0289 Encounter for other administrative examinations: Secondary | ICD-10-CM

## 2017-08-27 ENCOUNTER — Ambulatory Visit: Payer: Self-pay | Admitting: Family Medicine

## 2017-08-30 ENCOUNTER — Ambulatory Visit: Payer: BC Managed Care – PPO | Admitting: Family Medicine

## 2017-08-30 ENCOUNTER — Encounter: Payer: Self-pay | Admitting: Family Medicine

## 2017-08-30 VITALS — BP 124/58 | HR 75 | Temp 98.4°F | Resp 16 | Ht 64.17 in | Wt 152.6 lb

## 2017-08-30 DIAGNOSIS — F419 Anxiety disorder, unspecified: Secondary | ICD-10-CM | POA: Diagnosis not present

## 2017-08-30 DIAGNOSIS — F988 Other specified behavioral and emotional disorders with onset usually occurring in childhood and adolescence: Secondary | ICD-10-CM

## 2017-08-30 DIAGNOSIS — E559 Vitamin D deficiency, unspecified: Secondary | ICD-10-CM | POA: Diagnosis not present

## 2017-08-30 MED ORDER — AMPHETAMINE-DEXTROAMPHET ER 30 MG PO CP24: 30.0000 mg | ORAL_CAPSULE | ORAL | 0 refills | Status: DC

## 2017-08-30 NOTE — Assessment & Plan Note (Signed)
Stable on adderall xr 30 mg  Will see counselor

## 2017-08-30 NOTE — Patient Instructions (Signed)

## 2017-08-30 NOTE — Assessment & Plan Note (Addendum)
Doing better off meds F/u 6 months or prn Will see counselor

## 2017-08-30 NOTE — Progress Notes (Signed)
Patient ID: [name], female    DOB: 1996-05-26  Age: 22 y.o. MRN: [id number]    Subjective:  Subjective  HPI [name] presents for f/u psych and withdrawals.   She has been doing well since she has been off all meds.    Review of Systems  Constitutional: Negative for activity change, appetite change, fatigue and unexpected weight change.  Respiratory: Negative for cough and shortness of breath.   Cardiovascular: Negative for chest pain and palpitations.  Psychiatric/Behavioral: Negative for behavioral problems and dysphoric mood. The patient is nervous/anxious.     History Past Medical History:  Diagnosis Date  . ADHD (attention deficit hyperactivity disorder)   . Allergy   . Anxiety   . Bipolar disorder (HCC)   . Eczema 12/11/13    She has no past surgical history on file.   Her family history includes Asthma in her father and mother; COPD in her maternal grandmother; Diabetes in her maternal grandfather, paternal grandfather, and paternal grandmother; Heart disease in her father and maternal uncle; Hypertension in her father and maternal grandmother; Leukemia in her maternal uncle; Stroke in her father and paternal grandmother.She reports that  has never smoked. she has never used smokeless tobacco. She reports that she drinks alcohol. She reports that she does not use drugs.  Current Outpatient Medications on File Prior to Visit  Medication Sig Dispense Refill  . benzonatate (TESSALON) 100 MG capsule Take 1 capsule (100 mg total) by mouth 3 (three) times daily as needed. 20 capsule 0  . Cholecalciferol (VITAMIN D PO) Take by mouth.    . Fluticasone Propionate (FLONASE NA) Place into the nose.    . norgestimate-ethinyl estradiol (SPRINTEC 28) 0.25-35 MG-MCG tablet Take 1 tablet by mouth daily. Birth control method. 28 tablet 0  . diazepam (VALIUM) 5 MG tablet Take 5 mg by mouth 2 (two) times daily.    . Vitamin D, Ergocalciferol, (DRISDOL) 50000 units CAPS  capsule Take 50,000 Units by mouth once a week.  12   No current facility-administered medications on file prior to visit.      Objective:  Objective  Physical Exam  Constitutional: She is oriented to person, place, and time. She appears well-developed and well-nourished.  HENT:  Head: Normocephalic and atraumatic.  Eyes: Conjunctivae and EOM are normal.  Neck: Normal range of motion. Neck supple. No JVD present. Carotid bruit is not present. No thyromegaly present.  Cardiovascular: Normal rate, regular rhythm and normal heart sounds.  No murmur heard. Pulmonary/Chest: Effort normal and breath sounds normal. No respiratory distress. She has no wheezes. She has no rales. She exhibits no tenderness.  Musculoskeletal: She exhibits no edema.  Neurological: She is alert and oriented to person, place, and time.  Psychiatric: She has a normal mood and affect. Her behavior is normal. Judgment and thought content normal.  Nursing note and vitals reviewed.  BP (!) 124/58 (BP Location: Left Arm, Patient Position: Sitting, Cuff Size: Normal)   Pulse 75   Temp 98.4 F (36.9 C) (Oral)   Resp 16   Ht 5' 4.17" (1.63 m)   Wt 152 lb 9.6 oz (69.2 kg)   LMP 08/05/2017   SpO2 99%   BMI 26.05 kg/m  Wt Readings from Last 3 Encounters:  08/30/17 152 lb 9.6 oz (69.2 kg)  08/19/17 151 lb (68.5 kg)  05/02/17 141 lb 9.6 oz (64.2 kg)     Lab Results  Component Value Date   WBC 6.4 05/02/2017  HGB 13.1 05/02/2017   HCT 39.5 05/02/2017   PLT 336.0 05/02/2017   GLUCOSE 91 05/02/2017   CHOL 148 11/02/2016   TRIG 77 11/02/2016   HDL 64 11/02/2016   LDLCALC 69 11/02/2016   ALT 13 05/02/2017   AST 15 05/02/2017   NA 136 05/02/2017   K 3.8 05/02/2017   CL 102 05/02/2017   CREATININE 0.74 05/02/2017   BUN 14 05/02/2017   CO2 28 05/02/2017   TSH 1.20 03/01/2017   MICROALBUR 0.6 08/02/2014    No results found.   Assessment & Plan:  Plan  I am having [name] start on  amphetamine-dextroamphetamine, amphetamine-dextroamphetamine, and amphetamine-dextroamphetamine. I am also having her maintain her norgestimate-ethinyl estradiol, diazepam, Cholecalciferol (VITAMIN D PO), Fluticasone Propionate (FLONASE NA), Vitamin D (Ergocalciferol), and benzonatate.  Meds ordered this encounter  Medications  . amphetamine-dextroamphetamine (ADDERALL XR) 30 MG 24 hr capsule    Sig: Take 1 capsule (30 mg total) by mouth every morning.    Dispense:  30 capsule    Refill:  0    Do not fill until 10/28/2017  . amphetamine-dextroamphetamine (ADDERALL XR) 30 MG 24 hr capsule    Sig: Take 1 capsule (30 mg total) by mouth every morning.    Dispense:  30 capsule    Refill:  0    Do not fill until 09/30/2017  . amphetamine-dextroamphetamine (ADDERALL XR) 30 MG 24 hr capsule    Sig: Take 1 capsule (30 mg total) by mouth every morning.    Dispense:  30 capsule    Refill:  0    Problem List Items Addressed This Visit      Unprioritized   ADD (attention deficit disorder)    Stable on adderall xr 30 mg  Will see counselor      Relevant Medications   amphetamine-dextroamphetamine (ADDERALL XR) 30 MG 24 hr capsule   amphetamine-dextroamphetamine (ADDERALL XR) 30 MG 24 hr capsule   amphetamine-dextroamphetamine (ADDERALL XR) 30 MG 24 hr capsule   Anxiety    Doing better off meds F/u 6 months or prn Will see counselor        Other Visit Diagnoses    Vitamin D deficiency    -  Primary   Relevant Orders   Vitamin D (25 hydroxy)      Follow-up: Return in about 6 months (around 02/27/2018), or if symptoms worsen or fail to improve.  Donato SchultzYvonne R Lowne Chase, DO

## 2017-10-17 ENCOUNTER — Ambulatory Visit: Payer: BC Managed Care – PPO | Admitting: Family Medicine

## 2017-10-17 DIAGNOSIS — Z0289 Encounter for other administrative examinations: Secondary | ICD-10-CM

## 2017-10-21 ENCOUNTER — Encounter: Payer: Self-pay | Admitting: Family Medicine

## 2017-10-23 ENCOUNTER — Encounter: Payer: Self-pay | Admitting: Family

## 2017-10-23 ENCOUNTER — Other Ambulatory Visit (HOSPITAL_COMMUNITY)
Admission: RE | Admit: 2017-10-23 | Discharge: 2017-10-23 | Disposition: A | Discharge status: Home / Self Care | Payer: BC Managed Care – PPO | Source: Ambulatory Visit | Attending: Family Medicine | Admitting: Family Medicine

## 2017-10-23 ENCOUNTER — Ambulatory Visit: Payer: BC Managed Care – PPO | Admitting: Family

## 2017-10-23 VITALS — BP 123/73 | HR 100 | Temp 98.7°F | Resp 16 | Ht 64.0 in | Wt 151.8 lb

## 2017-10-23 DIAGNOSIS — Z7251 High risk heterosexual behavior: Secondary | ICD-10-CM

## 2017-10-23 DIAGNOSIS — A6000 Herpesviral infection of urogenital system, unspecified: Secondary | ICD-10-CM

## 2017-10-23 LAB — POCT URINE PREGNANCY: Preg Test, Ur: NEGATIVE

## 2017-10-23 MED ORDER — VALACYCLOVIR HCL 1 G PO TABS: 1000.0000 mg | ORAL_TABLET | Freq: Two times a day (BID) | ORAL | 0 refills | Status: DC

## 2017-10-23 NOTE — Patient Instructions (Addendum)
Please complete lab work piror to leaving.  Begin Valtrex tablets. Let me know if rash worsens or if not improved in 1 week.  We will contact you with your lab work.

## 2017-10-24 ENCOUNTER — Encounter: Payer: Self-pay | Admitting: Family

## 2017-10-24 DIAGNOSIS — A6000 Herpesviral infection of urogenital system, unspecified: Secondary | ICD-10-CM | POA: Insufficient documentation

## 2017-10-24 HISTORY — DX: Herpesviral infection of urogenital system, unspecified: A60.00

## 2017-10-24 LAB — CERVICOVAGINAL ANCILLARY ONLY
CHLAMYDIA, DNA PROBE: NEGATIVE
NEISSERIA GONORRHEA: NEGATIVE
Trichomonas: NEGATIVE

## 2017-10-24 NOTE — Progress Notes (Signed)
Reports that she was treated with diflucan.  symptoms improved.  Then she developed  + "bumps" on vagina 2 days ago which are painful. Had a new partner/unprotected sex.  Last intercouse was 10/11/17.  She denies vaginal discharge or itching.     Review of Systems See HPI      Past Medical History:  Diagnosis Date  . ADHD (attention deficit hyperactivity disorder)   . Allergy   . Anxiety   . Bipolar disorder (HCC)   . Eczema 12/11/13     Social History        Socioeconomic History  . Marital status: Single    Spouse name: Not on file  . Number of children: Not on file  . Years of education: Not on file  . Highest education level: Not on file  Social Needs  . Financial resource strain: Not on file  . Food insecurity - worry: Not on file  . Food insecurity - inability: Not on file  . Transportation needs - medical: Not on file  . Transportation needs - non-medical: Not on file  Occupational History  . Not on file  Tobacco Use  . Smoking status: Never Smoker  . Smokeless tobacco: Never Used  Substance and Sexual Activity  . Alcohol use: Yes    Comment: occ  . Drug use: No  . Sexual activity: Not on file  Other Topics Concern  . Not on file  Social History Narrative  . Not on file    No past surgical history on file.       Family History  Problem Relation Age of Onset  . Asthma Mother   . Stroke Father   . Heart disease Father   . Asthma Father   . Hypertension Father        pulmonary hypertension  . Heart disease Maternal Uncle   . Hypertension Maternal Grandmother   . COPD Maternal Grandmother   . Diabetes Maternal Grandfather   . Stroke Paternal Grandmother   . Diabetes Paternal Grandmother   . Diabetes Paternal Grandfather   . Leukemia Maternal Uncle          Allergies  Allergen Reactions  . Concerta [Methylphenidate] Other (See Comments)    hallucinations  . Clindamycin Hcl Rash  . Terbinafine Hcl Nausea And  Vomiting          Current Outpatient Medications on File Prior to Visit  Medication Sig Dispense Refill  . amphetamine-dextroamphetamine (ADDERALL XR) 30 MG 24 hr capsule Take 1 capsule (30 mg total) by mouth every morning. 30 capsule 0  . amphetamine-dextroamphetamine (ADDERALL XR) 30 MG 24 hr capsule Take 1 capsule (30 mg total) by mouth every morning. 30 capsule 0  . amphetamine-dextroamphetamine (ADDERALL XR) 30 MG 24 hr capsule Take 1 capsule (30 mg total) by mouth every morning. 30 capsule 0  . benzonatate (TESSALON) 100 MG capsule Take 1 capsule (100 mg total) by mouth 3 (three) times daily as needed. 20 capsule 0  . Cholecalciferol (VITAMIN D PO) Take by mouth.    . Fluticasone Propionate (FLONASE NA) Place into the nose.    . norgestimate-ethinyl estradiol (SPRINTEC 28) 0.25-35 MG-MCG tablet Take 1 tablet by mouth daily. Birth control method. 28 tablet 0  . diazepam (VALIUM) 5 MG tablet Take 5 mg by mouth 2 (two) times daily.    . Vitamin D, Ergocalciferol, (DRISDOL) 50000 units CAPS capsule Take 50,000 Units by mouth once a week.  12   No current facility-administered medications  on file prior to visit.     BP 123/73 (BP Location: Left Arm, Patient Position: Sitting, Cuff Size: Small)   Pulse 100   Temp 98.7 F (37.1 C) (Oral)   Resp 16   Ht 5\' 4"  (1.626 m)   Wt 151 lb 12.8 oz (68.9 kg)   LMP 09/30/2017   SpO2 100%   BMI 26.06 kg/m       Objective:   Physical Exam  Constitutional: She appears well-developed and well-nourished.  Cardiovascular: Normal rate, regular rhythm and normal heart sounds.  No murmur heard. Pulmonary/Chest: Effort normal and breath sounds normal. No respiratory distress. She has no wheezes.  Psychiatric: She has a normal mood and affect. Her behavior is normal. Judgment and thought content normal.  GU:  + blistered tender rash noted left perineum/base of left vulva  Assessment/Plan:   HSV2 infection- obtained HSV2 IgM  testing- titer is positive. Pt was counseled on importance of safe sex and that she can pass this infection to a partner.  HIV, RPR, GC/Chlamydia testing negative.

## 2017-10-26 LAB — HSV 1/2 AB (IGM), IFA W/RFLX TITER
HSV 1 IGM SCREEN: NEGATIVE
HSV 2 IgM Screen: POSITIVE — AB

## 2017-10-26 LAB — HSV 2 IGM TITER: HSV 2 IGM TITER: 1:20 {titer} — AB

## 2017-10-29 ENCOUNTER — Telehealth: Payer: Self-pay | Admitting: Family Medicine

## 2017-10-29 ENCOUNTER — Ambulatory Visit: Payer: BC Managed Care – PPO | Admitting: Family

## 2017-10-29 ENCOUNTER — Encounter: Payer: Self-pay | Admitting: Family

## 2017-10-29 VITALS — BP 122/80 | HR 86 | Temp 98.8°F | Resp 16 | Ht 64.0 in | Wt 152.6 lb

## 2017-10-29 DIAGNOSIS — A6000 Herpesviral infection of urogenital system, unspecified: Secondary | ICD-10-CM

## 2017-10-29 DIAGNOSIS — B9789 Other viral agents as the cause of diseases classified elsewhere: Secondary | ICD-10-CM

## 2017-10-29 DIAGNOSIS — J069 Acute upper respiratory infection, unspecified: Secondary | ICD-10-CM

## 2017-10-29 LAB — GONOCOCCUS CULTURE
MICRO NUMBER: 90319375
SPECIMEN QUALITY:: ADEQUATE

## 2017-10-29 LAB — HIV ANTIBODY (ROUTINE TESTING W REFLEX): HIV 1&2 Ab, 4th Generation: NONREACTIVE

## 2017-10-29 LAB — CHLAMYDIA CULTURE

## 2017-10-29 LAB — HSV 2 ANTIBODY, IGG: HSV 2 Glycoprotein G Ab, IgG: 15.8 index — ABNORMAL HIGH

## 2017-10-29 LAB — RPR: RPR Ser Ql: NONREACTIVE

## 2017-10-29 MED ORDER — NEOMYCIN-POLYMYXIN-HC 3.5-10000-1 OP SUSP: 2.0000 [drp] | Freq: Three times a day (TID) | OPHTHALMIC | 0 refills | Status: AC

## 2017-10-29 MED ORDER — BENZONATATE 100 MG PO CAPS: 100.0000 mg | ORAL_CAPSULE | Freq: Three times a day (TID) | ORAL | 0 refills | Status: DC | PRN

## 2017-10-29 NOTE — Progress Notes (Signed)
Subjective:    Patient ID: [name], female    DOB: 02/24/1996, 22 y.o.   MRN: [id number]  HPI  [name] is a 22 yr old female who presents today for follow up:  Genital herpes- last visit she noted blistering rash.  HSV 2 IgG is  and IgM were both positive. She reports rash has improved with valtrex.   Cough- reports 2 day history of cough.  Reports that she has been coughing "a lot."  Eye discharge x 2 days.  She denies fever, nasal drainage.  She reports that she had sore throat all last week but sore throat has now resolved.    Review of Systems See HPI  Past Medical History:  Diagnosis Date  . ADHD (attention deficit hyperactivity disorder)   . Allergy   . Anxiety   . Bipolar disorder (HCC)   . Eczema 12/11/13     Social History   Socioeconomic History  . Marital status: Single    Spouse name: Not on file  . Number of children: Not on file  . Years of education: Not on file  . Highest education level: Not on file  Social Needs  . Financial resource strain: Not on file  . Food insecurity - worry: Not on file  . Food insecurity - inability: Not on file  . Transportation needs - medical: Not on file  . Transportation needs - non-medical: Not on file  Occupational History  . Not on file  Tobacco Use  . Smoking status: Never Smoker  . Smokeless tobacco: Never Used  Substance and Sexual Activity  . Alcohol use: Yes    Comment: occ  . Drug use: No  . Sexual activity: Not on file  Other Topics Concern  . Not on file  Social History Narrative  . Not on file    No past surgical history on file.  Family History  Problem Relation Age of Onset  . Asthma Mother   . Stroke Father   . Heart disease Father   . Asthma Father   . Hypertension Father        pulmonary hypertension  . Heart disease Maternal Uncle   . Hypertension Maternal Grandmother   . COPD Maternal Grandmother   . Diabetes Maternal Grandfather   . Stroke Paternal Grandmother   .  Diabetes Paternal Grandmother   . Diabetes Paternal Grandfather   . Leukemia Maternal Uncle     Allergies  Allergen Reactions  . Concerta [Methylphenidate] Other (See Comments)    hallucinations  . Clindamycin Hcl Rash  . Terbinafine Hcl Nausea And Vomiting    Current Outpatient Medications on File Prior to Visit  Medication Sig Dispense Refill  . amphetamine-dextroamphetamine (ADDERALL XR) 30 MG 24 hr capsule Take 1 capsule (30 mg total) by mouth every morning. 30 capsule 0  . amphetamine-dextroamphetamine (ADDERALL XR) 30 MG 24 hr capsule Take 1 capsule (30 mg total) by mouth every morning. 30 capsule 0  . amphetamine-dextroamphetamine (ADDERALL XR) 30 MG 24 hr capsule Take 1 capsule (30 mg total) by mouth every morning. 30 capsule 0  . Cholecalciferol (VITAMIN D PO) Take by mouth.    . diazepam (VALIUM) 5 MG tablet Take 5 mg by mouth 2 (two) times daily.    . Fluticasone Propionate (FLONASE NA) Place into the nose.    . norgestimate-ethinyl estradiol (SPRINTEC 28) 0.25-35 MG-MCG tablet Take 1 tablet by mouth daily. Birth control method. 28 tablet 0  . valACYclovir (VALTREX) 1000  MG tablet Take 1 tablet (1,000 mg total) by mouth 2 (two) times daily. 20 tablet 0   No current facility-administered medications on file prior to visit.     BP 122/80 (BP Location: Right Arm, Patient Position: Sitting, Cuff Size: Small)   Pulse 86   Temp 98.8 F (37.1 C) (Oral)   Resp 16   Ht 5\' 4"  (1.626 m)   Wt 152 lb 9.6 oz (69.2 kg)   LMP 09/30/2017   SpO2 98%   BMI 26.19 kg/m       Objective:   Physical Exam  Constitutional: She appears well-developed and well-nourished.  HENT:  Head: Normocephalic and atraumatic.  Mouth/Throat: No oropharyngeal exudate, posterior oropharyngeal edema or posterior oropharyngeal erythema.  Eyes: Conjunctivae are normal. No scleral icterus.  Some crusting noted on eyelashes  Neck: Neck supple.  Cardiovascular: Normal rate, regular rhythm and normal  heart sounds.  No murmur heard. Pulmonary/Chest: Effort normal and breath sounds normal. No respiratory distress. She has no wheezes.  Lymphadenopathy:    She has no cervical adenopathy.  Psychiatric: She has a normal mood and affect. Her behavior is normal. Judgment and thought content normal.          Assessment & Plan:  Genital Herpes- recent outbreak clinically resolved.  Viral URI with cough- rx with tessalon, pt is advised to call if new/worsenint symptoms or if symptoms are not improved in 2-3 days.

## 2017-10-29 NOTE — Telephone Encounter (Signed)
Pt's mother came with her daughter to her appt with Sandford CrazeMelissa O'Sullivan and received FMLA paperwork and stated that she already has paperwork in place. She would like a call to clarify why new paperwork is needed.

## 2017-10-29 NOTE — Patient Instructions (Signed)
Begin cortisporin drops for mild conjunctivitis. You may use tessalon as needed for cough. Call if symptoms worsen, or if not improved in 2-3 days.

## 2017-10-30 ENCOUNTER — Telehealth: Payer: Self-pay | Admitting: Family

## 2017-10-30 MED ORDER — CIPROFLOXACIN HCL 0.3 % OP SOLN: OPHTHALMIC | 0 refills | Status: DC

## 2017-10-30 NOTE — Telephone Encounter (Signed)
Received mychart message from pt's mother that pharmacy did not have eye drop in stock and requested different med.

## 2017-11-04 ENCOUNTER — Telehealth: Payer: Self-pay | Admitting: *Deleted

## 2017-11-04 NOTE — Telephone Encounter (Signed)
Copied from CRM [id number]. Topic: General - Other >> Oct 30, 2017  9:29 AM Percival SpanishKennedy, Cheryl W wrote:  Pharmacy call to say the below is on back order and is asking for an alternative     neomycin-polymyxin-hydrocortisone (CORTISPORIN) 3.5-10000-1 ophthalmic suspension  [facility] [place] [address]   [facility]   [phone]

## 2017-11-05 ENCOUNTER — Encounter: Payer: BC Managed Care – PPO | Admitting: Family Medicine

## 2017-11-06 ENCOUNTER — Telehealth: Payer: Self-pay | Admitting: Family Medicine

## 2017-11-06 NOTE — Telephone Encounter (Signed)
Copied from CRM [id number]. Topic: Quick Communication - See Telephone Encounter >> Nov 06, 2017 10:36 AM Louie BunPalacios Medina, Rosey Batheresa D wrote: CRM for notification. See Telephone encounter for: 11/06/17. Patient called to get two different work note. She was out of work for the last two weeks. Her 1st day out was 10/23/17 until now. Please call patient when this is ready, thanks.

## 2017-11-07 ENCOUNTER — Other Ambulatory Visit: Payer: Self-pay | Admitting: Family

## 2017-11-07 ENCOUNTER — Ambulatory Visit: Payer: BC Managed Care – PPO | Admitting: Psychology

## 2017-11-07 DIAGNOSIS — F331 Major depressive disorder, recurrent, moderate: Secondary | ICD-10-CM | POA: Diagnosis not present

## 2017-11-07 NOTE — Telephone Encounter (Signed)
Is this FYI?

## 2017-11-07 NOTE — Telephone Encounter (Signed)
Reviewed results at her visit.

## 2017-11-12 NOTE — Telephone Encounter (Signed)
Can [name] write her out of work since she saw her last?

## 2017-11-13 ENCOUNTER — Encounter: Payer: Self-pay | Admitting: Family

## 2017-11-13 MED ORDER — FLUTICASONE PROPIONATE 50 MCG/ACT NA SUSP: 2.0000 | Freq: Every day | NASAL | 6 refills | Status: DC

## 2017-11-13 NOTE — Telephone Encounter (Signed)
I can excuse her for a total of 7 days, she reported symptoms started on 3/17.  Please see letter.

## 2017-11-13 NOTE — Telephone Encounter (Signed)
Notified pt of below. She states she no longer needs a letter but needs refill of valtrex and flonase. Flonase refill sent today. Advised pt that  Valtrex was sent on 11/07/17. Letter has been shredded.

## 2017-11-13 NOTE — Addendum Note (Signed)
Addended by: Mervin KungFERGERSON, Hinton Luellen A on: 11/13/2017 04:43 PM   Modules accepted: Orders

## 2017-12-06 ENCOUNTER — Other Ambulatory Visit: Payer: Self-pay | Admitting: Family

## 2017-12-09 ENCOUNTER — Telehealth: Payer: Self-pay | Admitting: Family Medicine

## 2017-12-09 ENCOUNTER — Ambulatory Visit: Payer: BC Managed Care – PPO | Admitting: Family Medicine

## 2017-12-09 DIAGNOSIS — Z0289 Encounter for other administrative examinations: Secondary | ICD-10-CM

## 2017-12-09 NOTE — Telephone Encounter (Unsigned)
Copied from CRM [id number]. Topic: Quick Communication - Rx Refill/Question >> Dec 09, 2017 12:13 PM Raquel Sarna wrote: Valacyclovir 1000 mg Eye Drops norgestimate-ethinyl estradiol (SPRINTEC 28) 0.25-35 MG-MCG tablet amphetamine-dextroamphetamine (ADDERALL XR) 30 MG 24 hr capsule  Pt needing refills on Rx's  [name], [place] - [address] [place] [address] Phone: [phone] Fax: [phone]

## 2017-12-09 NOTE — Telephone Encounter (Signed)
Pt requesting refill of Cilaxan gtts and Valacyclovir Called pt to assess symptoms, reports conjunctivitis "never really went away and now more reddened with the pollen".  Also states has continuous genital outbreak if off acyclovir. Marland Kitchen   LRF of gtts 10/30/17  LRF acyclovir 11/07/17  LOV 10/29/17   Refill of Sprintec  by historical provider  Refill of adderall  LRF 08/30/17  #30  0 refills

## 2017-12-10 MED ORDER — NORGESTIMATE-ETH ESTRADIOL 0.25-35 MG-MCG PO TABS: 1.0000 | ORAL_TABLET | Freq: Every day | ORAL | 0 refills | Status: DC

## 2017-12-10 NOTE — Telephone Encounter (Signed)
Sent you an request for her eye drops from pharmacy.  Valtrex and BCP filled.  Also requesting refill for Adderall  Database ran and is on your desk for review.  Last filled per database: 10/06/17 Last written: 08/30/17 Last ov: 08/30/17 Next ov:  12/16/17 Contract: due UDS:due

## 2017-12-10 NOTE — Telephone Encounter (Signed)
Do you want to refill the eye drops?

## 2017-12-11 ENCOUNTER — Other Ambulatory Visit: Payer: Self-pay | Admitting: Family Medicine

## 2017-12-11 ENCOUNTER — Other Ambulatory Visit: Payer: Self-pay | Admitting: *Deleted

## 2017-12-11 DIAGNOSIS — F988 Other specified behavioral and emotional disorders with onset usually occurring in childhood and adolescence: Secondary | ICD-10-CM

## 2017-12-11 MED ORDER — AMPHETAMINE-DEXTROAMPHET ER 30 MG PO CP24: 30.0000 mg | ORAL_CAPSULE | ORAL | 0 refills | Status: DC

## 2017-12-11 NOTE — Telephone Encounter (Signed)
Patient notified and will make an appointment

## 2017-12-11 NOTE — Telephone Encounter (Signed)
If eye symptoms are still there she may need eye dr --- she missed her last ov here

## 2017-12-16 ENCOUNTER — Ambulatory Visit: Payer: BC Managed Care – PPO | Admitting: Family Medicine

## 2017-12-17 ENCOUNTER — Encounter: Payer: Self-pay | Admitting: Family Medicine

## 2017-12-19 ENCOUNTER — Ambulatory Visit: Payer: Self-pay | Admitting: Psychology

## 2017-12-30 ENCOUNTER — Other Ambulatory Visit (HOSPITAL_COMMUNITY)
Admission: RE | Admit: 2017-12-30 | Discharge: 2017-12-30 | Disposition: A | Discharge status: Home / Self Care | Payer: BC Managed Care – PPO | Source: Ambulatory Visit | Attending: Family Medicine | Admitting: Family Medicine

## 2017-12-30 ENCOUNTER — Ambulatory Visit: Payer: BC Managed Care – PPO | Admitting: Family Medicine

## 2017-12-30 ENCOUNTER — Encounter: Payer: Self-pay | Admitting: Family Medicine

## 2017-12-30 VITALS — BP 120/78 | HR 92 | Temp 97.6°F | Resp 16 | Ht 64.0 in | Wt 150.6 lb

## 2017-12-30 DIAGNOSIS — B009 Herpesviral infection, unspecified: Secondary | ICD-10-CM | POA: Insufficient documentation

## 2017-12-30 DIAGNOSIS — R5383 Other fatigue: Secondary | ICD-10-CM

## 2017-12-30 DIAGNOSIS — F319 Bipolar disorder, unspecified: Secondary | ICD-10-CM | POA: Diagnosis not present

## 2017-12-30 DIAGNOSIS — A6000 Herpesviral infection of urogenital system, unspecified: Secondary | ICD-10-CM | POA: Diagnosis not present

## 2017-12-30 DIAGNOSIS — Z202 Contact with and (suspected) exposure to infections with a predominantly sexual mode of transmission: Secondary | ICD-10-CM

## 2017-12-30 DIAGNOSIS — F988 Other specified behavioral and emotional disorders with onset usually occurring in childhood and adolescence: Secondary | ICD-10-CM | POA: Diagnosis not present

## 2017-12-30 DIAGNOSIS — R41840 Attention and concentration deficit: Secondary | ICD-10-CM

## 2017-12-30 DIAGNOSIS — N63 Unspecified lump in unspecified breast: Secondary | ICD-10-CM | POA: Diagnosis not present

## 2017-12-30 MED ORDER — VALACYCLOVIR HCL 1 G PO TABS: 1000.0000 mg | ORAL_TABLET | Freq: Every day | ORAL | 3 refills | Status: DC

## 2017-12-30 MED ORDER — ATOMOXETINE HCL 40 MG PO CAPS: ORAL_CAPSULE | ORAL | 0 refills | Status: DC

## 2017-12-30 NOTE — Assessment & Plan Note (Signed)
Pt dx by psych-- pt requesting for [name] to rx meds but she stopped all her meds She agreed to see a new psych List with names and numbers given to pt

## 2017-12-30 NOTE — Patient Instructions (Addendum)
Pretty sure breast abnormality is fibrocystic-- we will get the [name] as requested,   Cut out caffeine,  Chocolate , etc    Supporting Someone With Attention Deficit Hyperactivity Disorder Attention deficit hyperactivity disorder (ADHD) is a behavior problem that is present in a person due to the way that his or her brain functions (neurobehavioral disorder). It is a common cause of behavioral and learning (academic) problems among children. ADHD is a long-term (chronic) condition. If this disorder is not treated, it can have serious effects into adolescence and adulthood. When a person has ADHD, his or her condition can affect others around him or her, such as friends and family members. Friends and family can help by offering support and understanding. What do I need to know about this condition? ADHD can affect daily functioning in ways that often cause problems for the person with ADHD and his or her friends and family members. A child with ADHD may:  Have a poor attention span. This means that he or she can only stay focused or interested in something for a short time.  Get distracted easily.  Have trouble listening to instructions.  Daydream.  Make careless mistakes.  Be forgetful.  Talk too much, such as blurting out answers to questions.  Have trouble sitting still for long.  Fidget or get out of his or her seat during class.  An adult with ADHD may:  Get distracted easily.  Be disorganized at home and work.  Miss, forget, or be late for appointments.  Have trouble with details.  Have trouble completing tasks.  Be irritable and impatient.  Get bored easily during meetings.  Have great difficulty concentrating.  What do I need to know about the treatment options? Treatment for this condition usually involves:  Behavioral treatment. Working with a Paramedic, the person with ADHD may: ? Set rewards for desired behavior. ? Set small goals and clear expectations,  and be held accountable for meeting them. ? Get help with planning and timing activities. ? Become more patient and more mindful of the condition.  Medicines, such as: ? Stimulant medicines that help a person to:  Control his or her behavior (decrease impulsivity).  Control his or her extra physical activity (decrease hyperactivity).  Increase his or her ability to pay attention. ? Antidepressants. ? Certain blood pressure medicines.  Structured classroom management for children at school, such as tutoring or extra support in classes.  Techniques for parents to use at home to help manage their child's symptoms and behavior. These include rewarding good behavior, providing consistent discipline, and setting limits.  How can I support my loved one? Talk about the condition  Pick a time to talk with your loved one when distractions and interruptions are unlikely.  Let your loved one know that he or she is capable of success. Focus on your loved one's strengths, and try to not let your loved one use ADHD as an excuse for undesirable behavior.  Let your loved one know that there are well-known, successful people who also have ADHD. This may be encouraging to your loved one.  Give your loved one time to process his or her thoughts and to ask questions.  Children with ADHD may benefit from hearing more about how their treatment plan will help them. This may help them focus on goal behaviors. Find support and resources A health care provider may be able to recommend resources that are available online or over the phone. You could start with:  Attention Deficit Disorder Association (ADDA): HotterNames.de  [employer] [facility]): MarathonMeals.com.cy.shtml  Training classes or conferences that help parents of children with ADHD to support their children and cope with the disorder.  Support groups  for families who are affected by ADHD.  General support If you are a parent of a child with ADHD, you can take the following actions to support your child's education:  Talk to teachers about the ways that your child learns best.  Be your child's advocate and stay in touch with his or her school about all problems related to ADHD.  At the end of the summer, make appointments to talk with teachers and other school staff before the new school year begins.  Listen to teachers carefully, and share your child's history with them.  Create a behavior plan that your child, your family, and the teachers can agree on. Write down goals to help your child succeed.  How should I care for myself? It is important to find ways to care for your body, mind, and well-being while supporting someone with ADHD.  Spend time with friends and family. Find someone you can talk to who will also help you work on using coping skills to manage stress.  Understand what your limits are. Say "no" to requests or events that lead to a schedule that is too busy.  Make time for activities that help you relax, and try to not feel guilty about taking time for yourself.  Consider trying meditation and deep breathing exercises to lower your stress.  Get plenty of sleep.  Exercise, even if it is just taking a short walk a few times a week.  If you are a parent of a child with ADHD, arrange for child care so you can take breaks once in a while.  What are some signs that the condition is getting worse? Signs that your loved one's condition may be getting worse include:  Increased trouble completing tasks and paying attention.  Hyperactivity and impulsivity.  Problems with relationships.  Impatience, restlessness, and mood swings.  Worsening problems at school, if applicable.  Contact a health care provider if:  Your loved one's symptoms get worse.  Your loved one shows signs of depression, anxiety, or another  mental health condition.  Your child has behavioral problems at school. Summary  Attention deficit hyperactivity disorder (ADHD) is a long-term (chronic) condition that can affect daily functioning in ways that often cause problems for the person with ADHD and his or her loved ones.  This disorder can be treated effectively with medicine, behavioral treatment, and techniques to manage symptoms and behaviors.  Many organizations and groups are available to help families to manage ADHD.  The support people in the life of someone with ADHD play an extremely important role in helping that person develop healthy behaviors to live a satisfying life.  It is important to find ways to care for your own body, mind, and well-being while supporting someone with ADHD. Make time for activities that help you relax. This information is not intended to replace advice given to you by your health care provider. Make sure you discuss any questions you have with your health care provider. Document Released: 12/11/2016 Document Revised: 12/11/2016 Document Reviewed: 12/11/2016 Elsevier Interactive Patient Education  2018 [employer].

## 2017-12-30 NOTE — Assessment & Plan Note (Signed)
Refill valtrex daily

## 2017-12-30 NOTE — Assessment & Plan Note (Signed)
Check labs / urine at pt request

## 2017-12-30 NOTE — Progress Notes (Signed)
Patient ID: [name], female   DOB: 1996-04-26, 22 y.o.   MRN: [id number]     Subjective:  I acted as a Neurosurgeon for Dr. Zola Button.  Apolonio Schneiders, CMA   Patient ID: [name], female    DOB: June 28, 1996, 22 y.o.   MRN: [id number]  Chief Complaint  Patient presents with  . Breast Pain    both     HPI  Patient is in today for breast pain, both breasts.  She also palpated nodules in both breasts but mostly in the R breast-- tender to touch.  Pt admits to drinking a lot of caffeine. She has tried to cut down.   She also needs a refill on her valtrex and would like to switch the adderall to straterra--- the adderall has caused anxiety and she used to take Blase Mess -- she is willing to see a new psych-- she will cont counseling with Barth Kirks.    Patient Care Team: Zola Button, Grayling Congress, DO as PCP - General (Family Medicine)   Past Medical History:  Diagnosis Date  . ADHD (attention deficit hyperactivity disorder)   . Allergy   . Anxiety   . Bipolar disorder (HCC)   . Eczema 12/11/13    No past surgical history on file.  Family History  Problem Relation Age of Onset  . Asthma Mother   . Stroke Father   . Heart disease Father   . Asthma Father   . Hypertension Father        pulmonary hypertension  . Heart disease Maternal Uncle   . Hypertension Maternal Grandmother   . COPD Maternal Grandmother   . Diabetes Maternal Grandfather   . Stroke Paternal Grandmother   . Diabetes Paternal Grandmother   . Diabetes Paternal Grandfather   . Leukemia Maternal Uncle     Social History   Socioeconomic History  . Marital status: Single    Spouse name: Not on file  . Number of children: Not on file  . Years of education: Not on file  . Highest education level: Not on file  Occupational History  . Not on file  Social Needs  . Financial resource strain: Not on file  . Food insecurity:    Worry: Not on file    Inability: Not on file  . Transportation needs:    Medical: Not  on file    Non-medical: Not on file  Tobacco Use  . Smoking status: Never Smoker  . Smokeless tobacco: Never Used  Substance and Sexual Activity  . Alcohol use: Yes    Comment: occ  . Drug use: No  . Sexual activity: Not on file  Lifestyle  . Physical activity:    Days per week: Not on file    Minutes per session: Not on file  . Stress: Not on file  Relationships  . Social connections:    Talks on phone: Not on file    Gets together: Not on file    Attends religious service: Not on file    Active member of club or organization: Not on file    Attends meetings of clubs or organizations: Not on file    Relationship status: Not on file  . Intimate partner violence:    Fear of current or ex partner: Not on file    Emotionally abused: Not on file    Physically abused: Not on file    Forced sexual activity: Not on file  Other Topics Concern  . Not on file  Social History Narrative  . Not on file    Outpatient Medications Prior to Visit  Medication Sig Dispense Refill  . benzonatate (TESSALON) 100 MG capsule Take 1 capsule (100 mg total) by mouth 3 (three) times daily as needed. 20 capsule 0  . Cholecalciferol (VITAMIN D PO) Take by mouth.    . ciprofloxacin (CILOXAN) 0.3 % ophthalmic solution One  1 drop both eyes, every 2 hours, while awake, for 2 days. Then 1 drop, every 4 hours, while awake, for the next 5 days. 5 mL 0  . fluticasone (FLONASE) 50 MCG/ACT nasal spray Place 2 sprays into both nostrils daily. 16 g 6  . norgestimate-ethinyl estradiol (SPRINTEC 28) 0.25-35 MG-MCG tablet Take 1 tablet by mouth daily. Birth control method. 84 tablet 0  . amphetamine-dextroamphetamine (ADDERALL XR) 30 MG 24 hr capsule Take 1 capsule (30 mg total) by mouth every morning. 30 capsule 0  . amphetamine-dextroamphetamine (ADDERALL XR) 30 MG 24 hr capsule Take 1 capsule (30 mg total) by mouth every morning. 30 capsule 0  . amphetamine-dextroamphetamine (ADDERALL XR) 30 MG 24 hr capsule Take  1 capsule (30 mg total) by mouth every morning. 30 capsule 0  . diazepam (VALIUM) 5 MG tablet Take 5 mg by mouth 2 (two) times daily.    . valACYclovir (VALTREX) 1000 MG tablet TAKE 1 TABLET BY MOUTH TWICE A DAY 20 tablet 0   No facility-administered medications prior to visit.     Allergies  Allergen Reactions  . Concerta [Methylphenidate] Other (See Comments)    hallucinations  . Clindamycin Hcl Rash  . Terbinafine Hcl Nausea And Vomiting    Review of Systems  Constitutional: Negative for fever and malaise/fatigue.  HENT: Negative for congestion.   Eyes: Negative for blurred vision.  Respiratory: Negative for cough and shortness of breath.   Cardiovascular: Negative for chest pain, palpitations and leg swelling.  Gastrointestinal: Negative for vomiting.  Musculoskeletal: Negative for back pain.  Skin: Negative for rash.  Neurological: Negative for loss of consciousness and headaches.  Psychiatric/Behavioral: Negative for hallucinations, memory loss, substance abuse and suicidal ideas. The patient is nervous/anxious. The patient does not have insomnia.        Objective:    Physical Exam  Constitutional: She is oriented to person, place, and time. She appears well-developed and well-nourished. No distress.  HENT:  Right Ear: External ear normal.  Left Ear: External ear normal.  Nose: Nose normal.  Mouth/Throat: Oropharynx is clear and moist.  Eyes: Pupils are equal, round, and reactive to light. EOM are normal.  Neck: Normal range of motion. Neck supple.  Cardiovascular: Normal rate, regular rhythm and normal heart sounds.  No murmur heard. Pulmonary/Chest: Effort normal and breath sounds normal. No respiratory distress. She has no wheezes. She has no rales. She exhibits no tenderness. Right breast exhibits mass and tenderness. Left breast exhibits tenderness. Left breast exhibits no inverted nipple, no mass, no nipple discharge and no skin change. No breast discharge or  bleeding. Breasts are symmetrical.  Neurological: She is alert and oriented to person, place, and time.  Psychiatric: She has a normal mood and affect. Her behavior is normal. Judgment and thought content normal.  Nursing note and vitals reviewed.   BP 120/78 (BP Location: Left Arm, Cuff Size: Normal)   Pulse 92   Temp 97.6 F (36.4 C) (Oral)   Resp 16   Ht  (1.626 m)   Wt 150 lb 9.6 oz (68.3 kg)   LMP 12/09/2017  SpO2 99%   BMI 25.85 kg/m  Wt Readings from Last 3 Encounters:  12/30/17 150 lb 9.6 oz (68.3 kg)  10/29/17 152 lb 9.6 oz (69.2 kg)  10/23/17 151 lb 12.8 oz (68.9 kg)   BP Readings from Last 3 Encounters:  12/30/17 120/78  10/29/17 122/80  10/23/17 123/73     Immunization History  Administered Date(s) Administered  . DTaP 04/03/1994, 05/15/1994, 09/20/1994  . HPV Quadrivalent 03/02/2008, 03/29/2010, 03/16/2013  . Hepatitis A 02/04/2006  . Hepatitis A, Adult 08/02/2014  . Hepatitis B 01/01/94, 04/03/1994, 09/20/1994  . HiB (PRP-OMP) 04/03/1994, 05/15/1994, 09/20/1994  . IPV 04/03/1994, 05/15/1994, 09/20/1994  . Influenza Split 06/30/2012  . Influenza,inj,Quad PF,6+ Mos 06/22/2014, 05/13/2015  . Influenza-Unspecified 04/12/2016  . Meningococcal B, OMV 11/02/2016  . Meningococcal Conjugate 03/02/2008, 03/16/2013  . Td 04/02/2005  . Tdap 04/02/2005, 11/02/2016    Health Maintenance  Topic Date Due  . INFLUENZA VACCINE  03/13/2018  . PAP SMEAR  11/03/2019  . TETANUS/TDAP  11/03/2026  . HIV Screening  Completed    Lab Results  Component Value Date   WBC 6.4 05/02/2017   HGB 13.1 05/02/2017   HCT 39.5 05/02/2017   PLT 336.0 05/02/2017   GLUCOSE 91 05/02/2017   CHOL 148 11/02/2016   TRIG 77 11/02/2016   HDL 64 11/02/2016   LDLCALC 69 11/02/2016   ALT 13 05/02/2017   AST 15 05/02/2017   NA 136 05/02/2017   K 3.8 05/02/2017   CL 102 05/02/2017   CREATININE 0.74 05/02/2017   BUN 14 05/02/2017   CO2 28 05/02/2017   TSH 1.20 03/01/2017    MICROALBUR 0.6 08/02/2014    Lab Results  Component Value Date   TSH 1.20 03/01/2017   Lab Results  Component Value Date   WBC 6.4 05/02/2017   HGB 13.1 05/02/2017   HCT 39.5 05/02/2017   MCV 90.8 05/02/2017   PLT 336.0 05/02/2017   Lab Results  Component Value Date   NA 136 05/02/2017   K 3.8 05/02/2017   CO2 28 05/02/2017   GLUCOSE 91 05/02/2017   BUN 14 05/02/2017   CREATININE 0.74 05/02/2017   BILITOT 0.7 05/02/2017   ALKPHOS 36 (L) 05/02/2017   AST 15 05/02/2017   ALT 13 05/02/2017   PROT 7.6 05/02/2017   ALBUMIN 4.3 05/02/2017   CALCIUM 9.6 05/02/2017   ANIONGAP 7 03/29/2017   GFR 124.81 05/02/2017   Lab Results  Component Value Date   CHOL 148 11/02/2016   Lab Results  Component Value Date   HDL 64 11/02/2016   Lab Results  Component Value Date   LDLCALC 69 11/02/2016   Lab Results  Component Value Date   TRIG 77 11/02/2016   Lab Results  Component Value Date   CHOLHDL 2.3 11/02/2016   No results found for: HGBA1C       Assessment & Plan:   Problem List Items Addressed This Visit      Unprioritized   ADD (attention deficit disorder)    Pt would like to switch to straterra-- she was on that before Start with 40 mg daily with food F/u 1 month or sooner prn       Bipolar disorder (HCC)    Pt dx by psych-- pt requesting for [name] to rx meds but she stopped all her meds She agreed to see a new psych List with names and numbers given to pt      Genital herpes simplex    Refill valtrex  daily      Relevant Medications   valACYclovir (VALTREX) 1000 MG tablet   STD exposure    Check labs / urine at pt request       Other Visit Diagnoses    Attention and concentration deficit    -  Primary   Relevant Medications   atomoxetine (STRATTERA) 40 MG capsule   Herpes simplex type II infection       Relevant Medications   valACYclovir (VALTREX) 1000 MG tablet   Other Relevant Orders   HIV antibody   RPR   Urine cytology ancillary only     Other fatigue       Relevant Orders   CBC with Differential/Platelet   Vitamin D (25 hydroxy)   Vitamin B12   Breast mass in female       Relevant Orders   US BREAST COMPLETE UNI RIGHT INC AXILLA      I have discontinued Roneshia Y. Lukehart's diazepam, amphetamine-dextroamphetamine, amphetamine-dextroamphetamine, and amphetamine-dextroamphetamine. I have also changed her valACYclovir. Additionally, I am having her start on atomoxetine. Lastly, I am having her maintain her Cholecalciferol (VITAMIN D PO), benzonatate, ciprofloxacin, fluticasone, and norgestimate-ethinyl estradiol.  Meds ordered this encounter  Medications  . atomoxetine (STRATTERA) 40 MG capsule    Sig: 1 po qd    Dispense:  30 capsule    Refill:  0  . valACYclovir (VALTREX) 1000 MG tablet    Sig: Take 1 tablet (1,000 mg total) by mouth daily.    Dispense:  90 tablet    Refill:  3    THANK YOU !    CMA served as Neurosurgeon during this visit. History, Physical and Plan performed by medical provider. Documentation and orders reviewed and attested to.  [name], DO

## 2017-12-30 NOTE — Assessment & Plan Note (Signed)
Pt would like to switch to straterra-- she was on that before Start with 40 mg daily with food F/u 1 month or sooner prn

## 2017-12-31 LAB — CBC WITH DIFFERENTIAL/PLATELET
BASOS ABS: 0.1 10*3/uL (ref 0.0–0.1)
Basophils Relative: 0.8 % (ref 0.0–3.0)
EOS PCT: 2.3 % (ref 0.0–5.0)
Eosinophils Absolute: 0.2 10*3/uL (ref 0.0–0.7)
HCT: 39.9 % (ref 36.0–46.0)
HEMOGLOBIN: 13.3 g/dL (ref 12.0–15.0)
LYMPHS ABS: 2.3 10*3/uL (ref 0.7–4.0)
Lymphocytes Relative: 31 % (ref 12.0–46.0)
MCHC: 33.5 g/dL (ref 30.0–36.0)
MCV: 91.6 fl (ref 78.0–100.0)
MONO ABS: 0.6 10*3/uL (ref 0.1–1.0)
Monocytes Relative: 8.2 % (ref 3.0–12.0)
NEUTROS PCT: 57.7 % (ref 43.0–77.0)
Neutro Abs: 4.3 10*3/uL (ref 1.4–7.7)
Platelets: 304 10*3/uL (ref 150.0–400.0)
RBC: 4.35 Mil/uL (ref 3.87–5.11)
RDW: 14.6 % (ref 11.5–15.5)
WBC: 7.4 10*3/uL (ref 4.0–10.5)

## 2017-12-31 LAB — VITAMIN B12: VITAMIN B 12: 290 pg/mL (ref 211–911)

## 2017-12-31 LAB — VITAMIN D 25 HYDROXY (VIT D DEFICIENCY, FRACTURES): VITD: 37.11 ng/mL (ref 30.00–100.00)

## 2018-01-01 LAB — HIV ANTIBODY (ROUTINE TESTING W REFLEX): HIV 1&2 Ab, 4th Generation: NONREACTIVE

## 2018-01-01 LAB — URINE CYTOLOGY ANCILLARY ONLY
Bacterial vaginitis: NEGATIVE
Candida vaginitis: NEGATIVE
Chlamydia: NEGATIVE
Neisseria Gonorrhea: NEGATIVE
Trichomonas: NEGATIVE

## 2018-01-01 LAB — RPR: RPR Ser Ql: NONREACTIVE

## 2018-01-07 ENCOUNTER — Encounter: Payer: Self-pay | Admitting: Family Medicine

## 2018-01-07 ENCOUNTER — Encounter: Payer: BC Managed Care – PPO | Admitting: Family Medicine

## 2018-01-07 DIAGNOSIS — Z0289 Encounter for other administrative examinations: Secondary | ICD-10-CM

## 2018-01-08 ENCOUNTER — Ambulatory Visit: Payer: BC Managed Care – PPO | Admitting: Psychology

## 2018-01-15 ENCOUNTER — Ambulatory Visit
Admission: RE | Admit: 2018-01-15 | Discharge: 2018-01-15 | Disposition: A | Discharge status: Home / Self Care | Payer: BC Managed Care – PPO | Source: Ambulatory Visit | Attending: Family Medicine | Admitting: Family Medicine

## 2018-01-15 DIAGNOSIS — N63 Unspecified lump in unspecified breast: Secondary | ICD-10-CM

## 2018-01-22 ENCOUNTER — Other Ambulatory Visit: Payer: Self-pay | Admitting: Family Medicine

## 2018-01-22 DIAGNOSIS — R41840 Attention and concentration deficit: Secondary | ICD-10-CM

## 2018-01-31 ENCOUNTER — Encounter: Payer: Self-pay | Admitting: Family Medicine

## 2018-01-31 ENCOUNTER — Other Ambulatory Visit (HOSPITAL_COMMUNITY)
Admission: RE | Admit: 2018-01-31 | Discharge: 2018-01-31 | Disposition: A | Discharge status: Home / Self Care | Payer: BC Managed Care – PPO | Source: Ambulatory Visit | Attending: Family Medicine | Admitting: Family Medicine

## 2018-01-31 ENCOUNTER — Ambulatory Visit: Payer: BC Managed Care – PPO | Admitting: Family Medicine

## 2018-01-31 ENCOUNTER — Ambulatory Visit (INDEPENDENT_AMBULATORY_CARE_PROVIDER_SITE_OTHER): Payer: BC Managed Care – PPO | Admitting: Family Medicine

## 2018-01-31 VITALS — BP 117/81 | HR 86 | Temp 98.8°F | Resp 16 | Ht 64.0 in | Wt 144.2 lb

## 2018-01-31 DIAGNOSIS — F314 Bipolar disorder, current episode depressed, severe, without psychotic features: Secondary | ICD-10-CM | POA: Diagnosis not present

## 2018-01-31 DIAGNOSIS — F909 Attention-deficit hyperactivity disorder, unspecified type: Secondary | ICD-10-CM

## 2018-01-31 DIAGNOSIS — F988 Other specified behavioral and emotional disorders with onset usually occurring in childhood and adolescence: Secondary | ICD-10-CM

## 2018-01-31 DIAGNOSIS — Z0289 Encounter for other administrative examinations: Secondary | ICD-10-CM

## 2018-01-31 DIAGNOSIS — R102 Pelvic and perineal pain: Secondary | ICD-10-CM | POA: Insufficient documentation

## 2018-01-31 DIAGNOSIS — R110 Nausea: Secondary | ICD-10-CM | POA: Diagnosis not present

## 2018-01-31 HISTORY — DX: Pelvic and perineal pain: R10.2

## 2018-01-31 LAB — POCT URINE PREGNANCY: PREG TEST UR: NEGATIVE

## 2018-01-31 LAB — HCG, SERUM, QUALITATIVE: Preg, Serum: NEGATIVE

## 2018-01-31 MED ORDER — ATOMOXETINE HCL 25 MG PO CAPS: 25.0000 mg | ORAL_CAPSULE | Freq: Every day | ORAL | 2 refills | Status: DC

## 2018-01-31 NOTE — Progress Notes (Signed)
Patient ID: [name], female    DOB: June 01, 1996  Age: 22 y.o. MRN: [id number]    Subjective:  Subjective  HPI [name] presents for f/u adhd meds.  She thinks it is causing her tremors.  She has not made the appointment with the mood disorder clinic yet. She also thinks she may be pregnant.  Her last period was the last week of last month.  She has been nauseous a lot and her stomach has been bloated.  + pelvic pressure.   When she came out of the bathroom cma Apolonio Schneiders said the pt said "watch my urine"  "someone has accused me of using drugs and I have called a private investigator about this"  Pt is off he psych meds -- she used to see Dr Helane Rima and was being treated for bipolar.   Urine preg at home neg Review of Systems  Constitutional: Negative for appetite change, diaphoresis, fatigue, fever and unexpected weight change.  HENT: Negative for congestion.   Eyes: Negative for pain, redness and visual disturbance.  Respiratory: Negative for cough, chest tightness, shortness of breath and wheezing.   Cardiovascular: Negative for chest pain, palpitations and leg swelling.  Gastrointestinal: Positive for abdominal distention and nausea. Negative for abdominal pain, blood in stool, constipation and diarrhea.  Endocrine: Negative for cold intolerance, heat intolerance, polydipsia, polyphagia and polyuria.  Genitourinary: Negative for difficulty urinating, dysuria and frequency.  Skin: Negative for rash.  Allergic/Immunologic: Negative for environmental allergies.  Neurological: Positive for tremors. Negative for dizziness, light-headedness, numbness and headaches.  Psychiatric/Behavioral: Positive for behavioral problems. Negative for sleep disturbance and suicidal ideas. The patient is nervous/anxious.     History Past Medical History:  Diagnosis Date  . ADHD (attention deficit hyperactivity disorder)   . Allergy   . Anxiety   . Bipolar disorder (HCC)   . Eczema 12/11/13     She has no past surgical history on file.   Her family history includes Asthma in her father and mother; COPD in her maternal grandmother; Diabetes in her maternal grandfather, paternal grandfather, and paternal grandmother; Heart disease in her father and maternal uncle; Hypertension in her father and maternal grandmother; Leukemia in her maternal uncle; Stroke in her father and paternal grandmother.She reports that she has never smoked. She has never used smokeless tobacco. She reports that she drinks alcohol. She reports that she does not use drugs.  Current Outpatient Medications on File Prior to Visit  Medication Sig Dispense Refill  . atomoxetine (STRATTERA) 40 MG capsule TAKE 1 CAPSULE BY MOUTH EVERY DAY 30 capsule 0  . Cholecalciferol (VITAMIN D PO) Take by mouth.    . fluticasone (FLONASE) 50 MCG/ACT nasal spray Place 2 sprays into both nostrils daily. 16 g 6  . norgestimate-ethinyl estradiol (SPRINTEC 28) 0.25-35 MG-MCG tablet Take 1 tablet by mouth daily. Birth control method. 84 tablet 0  . valACYclovir (VALTREX) 1000 MG tablet Take 1 tablet (1,000 mg total) by mouth daily. 90 tablet 3   No current facility-administered medications on file prior to visit.      Objective:  Objective  Physical Exam  Constitutional: She is oriented to person, place, and time. She appears well-developed and well-nourished.  HENT:  Head: Normocephalic and atraumatic.  Eyes: Conjunctivae and EOM are normal.  Neck: Normal range of motion. Neck supple. No JVD present. Carotid bruit is not present. No thyromegaly present.  Cardiovascular: Normal rate, regular rhythm and normal heart sounds.  No murmur heard. Pulmonary/Chest: Effort normal  and breath sounds normal. No respiratory distress. She has no wheezes. She has no rales. She exhibits no tenderness.  Abdominal: Soft. She exhibits distension. There is tenderness in the suprapubic area. There is no rigidity, no rebound, no guarding, no CVA  tenderness, no tenderness at McBurney's point and negative Murphy's sign.  Musculoskeletal: She exhibits no edema.  Neurological: She is alert and oriented to person, place, and time.  Psychiatric: Her speech is normal and behavior is normal. Thought content is paranoid. She exhibits a depressed mood. She expresses no homicidal and no suicidal ideation. She expresses no suicidal plans and no homicidal plans.  Nursing note and vitals reviewed.  BP 117/81 (BP Location: Left Arm, Cuff Size: Normal)   Pulse 86   Temp 98.8 F (37.1 C) (Oral)   Resp 16   Ht 5\' 4"  (1.626 m)   Wt 144 lb 3.2 oz (65.4 kg)   SpO2 100%   BMI 24.75 kg/m  Wt Readings from Last 3 Encounters:  01/31/18 144 lb 3.2 oz (65.4 kg)  12/30/17 150 lb 9.6 oz (68.3 kg)  10/29/17 152 lb 9.6 oz (69.2 kg)     Lab Results  Component Value Date   WBC 7.4 12/30/2017   HGB 13.3 12/30/2017   HCT 39.9 12/30/2017   PLT 304.0 12/30/2017   GLUCOSE 91 05/02/2017   CHOL 148 11/02/2016   TRIG 77 11/02/2016   HDL 64 11/02/2016   LDLCALC 69 11/02/2016   ALT 13 05/02/2017   AST 15 05/02/2017   NA 136 05/02/2017   K 3.8 05/02/2017   CL 102 05/02/2017   CREATININE 0.74 05/02/2017   BUN 14 05/02/2017   CO2 28 05/02/2017   TSH 1.20 03/01/2017   MICROALBUR 0.6 08/02/2014    [place] [facility] Right [facility] Axilla  Result Date: 01/15/2018 CLINICAL DATA:  22 year old female with palpable abnormality involving the upper right breast. The patient also reports diffuse/nonfocal bilateral breast pain and episodes of nipple numbness. EXAM: ULTRASOUND OF THE RIGHT BREAST COMPARISON:  None. FINDINGS: Physical examination of the upper right breast reveals generalized thickening. This feel similar to the contralateral breast. No underlying discrete palpable masses. Targeted ultrasound of the entire upper/upper outer right breast was performed. No suspicious masses or abnormality seen, only extremely dense fibroglandular tissue identified. IMPRESSION:  No suspicious abnormalities in the region of palpable concern in the upper-outer right breast. This palpable abnormality may be due to extremely dense fibroglandular tissue in this location. RECOMMENDATION: 1. Recommend further management of the right breast palpable abnormality as well as diffuse breast pain and nipple numbness be based on clinical assessment. 2. Screening mammogram at age 22 unless there are persistent or intervening clinical concerns. (Code:[id number]-B-40A) I have discussed the findings and recommendations with the patient. Results were also provided in writing at the conclusion of the visit. If applicable, a reminder letter will be sent to the patient regarding the next appointment. BI-RADS CATEGORY  1: Negative. Electronically Signed   By: Edwin CapJennifer  Jarosz M.D.   On: 01/15/2018 11:21     Assessment & Plan:  Plan  I have discontinued Estrella DeedsShekinah Y. [name]'s benzonatate and ciprofloxacin. I am also having her start on atomoxetine. Additionally, I am having her maintain her Cholecalciferol (VITAMIN D PO), fluticasone, norgestimate-ethinyl estradiol, valACYclovir, and atomoxetine.  Meds ordered this encounter  Medications  . atomoxetine (STRATTERA) 25 MG capsule    Sig: Take 1 capsule (25 mg total) by mouth daily.    Dispense:  30 capsule  Refill:  2    Problem List Items Addressed This Visit      Unprioritized   ADD (attention deficit disorder)    Pt wanted to try a lower dose of straterra -- dec to 25 mg  F/u psych      Pelvic pain   Relevant Orders   US PELVIS (TRANSABDOMINAL ONLY)   US PELVIS TRANSVANGINAL NON-OB (TV ONLY)   Urine cytology ancillary only   POCT Urinalysis Dipstick (Automated)   Severe bipolar I disorder, current or most recent episode depressed (HCC)    Pt will call mood disorder clinic for treatment        Other Visit Diagnoses    Nausea    -  Primary   Relevant Orders   POCT urine pregnancy (Completed)   hCG, serum, qualitative (Completed)    POCT Urinalysis Dipstick (Automated)   Adult ADHD       Relevant Medications   atomoxetine (STRATTERA) 25 MG capsule      Follow-up: Return if symptoms worsen or fail to improve.  [name], DO

## 2018-01-31 NOTE — Assessment & Plan Note (Signed)
Pt wanted to try a lower dose of straterra -- dec to 25 mg  F/u psych

## 2018-01-31 NOTE — Patient Instructions (Signed)
Pelvic Pain, Female Pelvic pain is pain in your lower abdomen, below your belly button and between your hips. The pain may start suddenly (acute), keep coming back (recurring), or last a long time (chronic). Pelvic pain that lasts longer than six months is considered chronic. Pelvic pain may affect your:  Reproductive organs.  Urinary system.  Digestive tract.  Musculoskeletal system.  There are many potential causes of pelvic pain. Sometimes, the pain can be a result of digestive or urinary conditions, strained muscles or ligaments, or even reproductive conditions. Sometimes the cause of pelvic pain is not known. Follow these instructions at home:  Take over-the-counter and prescription medicines only as told by your health care provider.  Rest as told by your health care provider.  Do not have sex it if hurts.  Keep a journal of your pelvic pain. Write down: ? When the pain started. ? Where the pain is located. ? What seems to make the pain better or worse, such as food or your menstrual cycle. ? Any symptoms you have along with the pain.  Keep all follow-up visits as told by your health care provider. This is important. Contact a health care provider if:  Medicine does not help your pain.  Your pain comes back.  You have new symptoms.  You have abnormal vaginal discharge or bleeding, including bleeding after menopause.  You have a fever or chills.  You are constipated.  You have blood in your urine or stool.  You have foul-smelling urine.  You feel weak or lightheaded. Get help right away if:  You have sudden severe pain.  Your pain gets steadily worse.  You have severe pain along with fever, nausea, vomiting, or excessive sweating.  You lose consciousness. This information is not intended to replace advice given to you by your health care provider. Make sure you discuss any questions you have with your health care provider. Document Released: 06/26/2004  Document Revised: 08/24/2015 Document Reviewed: 05/20/2015 Elsevier Interactive Patient Education  2018 Elsevier Inc.  

## 2018-01-31 NOTE — Assessment & Plan Note (Signed)
Pt will call mood disorder clinic for treatment

## 2018-02-03 ENCOUNTER — Ambulatory Visit: Payer: BC Managed Care – PPO | Admitting: Family Medicine

## 2018-02-03 LAB — URINE CYTOLOGY ANCILLARY ONLY
Chlamydia: NEGATIVE
NEISSERIA GONORRHEA: NEGATIVE
TRICH (WINDOWPATH): NEGATIVE

## 2018-02-04 LAB — URINE CYTOLOGY ANCILLARY ONLY
BACTERIAL VAGINITIS: NEGATIVE
Candida vaginitis: NEGATIVE

## 2018-02-10 ENCOUNTER — Encounter: Payer: Self-pay | Admitting: Family

## 2018-02-10 ENCOUNTER — Ambulatory Visit: Payer: BC Managed Care – PPO | Admitting: Family

## 2018-02-10 VITALS — BP 130/89 | HR 99 | Temp 98.5°F | Resp 16 | Ht 64.0 in | Wt 139.6 lb

## 2018-02-10 DIAGNOSIS — K529 Noninfective gastroenteritis and colitis, unspecified: Secondary | ICD-10-CM

## 2018-02-10 DIAGNOSIS — R251 Tremor, unspecified: Secondary | ICD-10-CM | POA: Diagnosis not present

## 2018-02-10 LAB — COMPREHENSIVE METABOLIC PANEL
ALT: 14 U/L (ref 0–35)
AST: 15 U/L (ref 0–37)
Albumin: 4.5 g/dL (ref 3.5–5.2)
Alkaline Phosphatase: 47 U/L (ref 39–117)
BILIRUBIN TOTAL: 1.1 mg/dL (ref 0.2–1.2)
BUN: 7 mg/dL (ref 6–23)
CHLORIDE: 103 meq/L (ref 96–112)
CO2: 31 mEq/L (ref 19–32)
CREATININE: 0.76 mg/dL (ref 0.40–1.20)
Calcium: 10.2 mg/dL (ref 8.4–10.5)
GFR: 120.22 mL/min (ref >60.00)
GLUCOSE: 90 mg/dL (ref 70–99)
Potassium: 5.6 mEq/L — ABNORMAL HIGH (ref 3.5–5.1)
Sodium: 140 mEq/L (ref 135–145)
Total Protein: 7.5 g/dL (ref 6.0–8.3)

## 2018-02-10 LAB — CBC WITH DIFFERENTIAL/PLATELET
BASOS ABS: 0 10*3/uL (ref 0.0–0.1)
BASOS PCT: 0.6 % (ref 0.0–3.0)
Eosinophils Absolute: 0.2 10*3/uL (ref 0.0–0.7)
Eosinophils Relative: 4.1 % (ref 0.0–5.0)
HCT: 42.4 % (ref 36.0–46.0)
Hemoglobin: 14.2 g/dL (ref 12.0–15.0)
LYMPHS ABS: 1.9 10*3/uL (ref 0.7–4.0)
Lymphocytes Relative: 37.6 % (ref 12.0–46.0)
MCHC: 33.5 g/dL (ref 30.0–36.0)
MCV: 92.6 fl (ref 78.0–100.0)
MONOS PCT: 10.9 % (ref 3.0–12.0)
Monocytes Absolute: 0.6 10*3/uL (ref 0.1–1.0)
NEUTROS PCT: 46.8 % (ref 43.0–77.0)
Neutro Abs: 2.4 10*3/uL (ref 1.4–7.7)
PLATELETS: 386 10*3/uL (ref 150.0–400.0)
RBC: 4.58 Mil/uL (ref 3.87–5.11)
RDW: 14.8 % (ref 11.5–15.5)
WBC: 5 10*3/uL (ref 4.0–10.5)

## 2018-02-10 MED ORDER — ONDANSETRON HCL 4 MG PO TABS: 4.0000 mg | ORAL_TABLET | Freq: Three times a day (TID) | ORAL | 0 refills | Status: DC | PRN

## 2018-02-10 NOTE — Progress Notes (Signed)
Subjective:    Patient ID: [name], female    DOB: 1996/04/05, 22 y.o.   MRN: [id number]  HPI  Patient is a 22 yr old female who presents today with two concerns:  1) abdominal pain- reports that her stomach started to hurt. Reports that she is "on her period." Has had normal stools.  Notes that she had epigastric pain earlier today.  Had sushi on Friday and abdominal pain began while she was eating the sushi so she did not finish it.   2) tremor- reports that her tremor is "a little better" with the decrease in strattera.       Review of Systems    see HPI  Past Medical History:  Diagnosis Date  . ADHD (attention deficit hyperactivity disorder)   . Allergy   . Anxiety   . Bipolar disorder (HCC)   . Eczema 12/11/13     Social History   Socioeconomic History  . Marital status: Single    Spouse name: Not on file  . Number of children: Not on file  . Years of education: Not on file  . Highest education level: Not on file  Occupational History  . Not on file  Social Needs  . Financial resource strain: Not on file  . Food insecurity:    Worry: Not on file    Inability: Not on file  . Transportation needs:    Medical: Not on file    Non-medical: Not on file  Tobacco Use  . Smoking status: Never Smoker  . Smokeless tobacco: Never Used  Substance and Sexual Activity  . Alcohol use: Yes    Comment: occ  . Drug use: No  . Sexual activity: Not on file  Lifestyle  . Physical activity:    Days per week: Not on file    Minutes per session: Not on file  . Stress: Not on file  Relationships  . Social connections:    Talks on phone: Not on file    Gets together: Not on file    Attends religious service: Not on file    Active member of club or organization: Not on file    Attends meetings of clubs or organizations: Not on file    Relationship status: Not on file  . Intimate partner violence:    Fear of current or ex partner: Not on file    Emotionally  abused: Not on file    Physically abused: Not on file    Forced sexual activity: Not on file  Other Topics Concern  . Not on file  Social History Narrative  . Not on file    No past surgical history on file.  Family History  Problem Relation Age of Onset  . Asthma Mother   . Stroke Father   . Heart disease Father   . Asthma Father   . Hypertension Father        pulmonary hypertension  . Heart disease Maternal Uncle   . Hypertension Maternal Grandmother   . COPD Maternal Grandmother   . Diabetes Maternal Grandfather   . Stroke Paternal Grandmother   . Diabetes Paternal Grandmother   . Diabetes Paternal Grandfather   . Leukemia Maternal Uncle     Allergies  Allergen Reactions  . Concerta [Methylphenidate] Other (See Comments)    hallucinations  . Clindamycin Hcl Rash  . Terbinafine Hcl Nausea And Vomiting    Current Outpatient Medications on File Prior to Visit  Medication Sig Dispense Refill  .  atomoxetine (STRATTERA) 25 MG capsule Take 1 capsule (25 mg total) by mouth daily. 30 capsule 2  . Cholecalciferol (VITAMIN D PO) Take by mouth.    . fluticasone (FLONASE) 50 MCG/ACT nasal spray Place 2 sprays into both nostrils daily. 16 g 6  . norgestimate-ethinyl estradiol (SPRINTEC 28) 0.25-35 MG-MCG tablet Take 1 tablet by mouth daily. Birth control method. 84 tablet 0  . valACYclovir (VALTREX) 1000 MG tablet Take 1 tablet (1,000 mg total) by mouth daily. 90 tablet 3   No current facility-administered medications on file prior to visit.     BP 130/89 (BP Location: Left Arm, Patient Position: Sitting, Cuff Size: Small)   Pulse 99   Temp 98.5 F (36.9 C) (Oral)   Resp 16   Ht 5\' 4"  (1.626 m)   Wt 139 lb 9.6 oz (63.3 kg)   SpO2 97%   BMI 23.96 kg/m    Objective:   Physical Exam  Constitutional: She is oriented to person, place, and time. She appears well-developed and well-nourished.  Cardiovascular: Normal rate, regular rhythm and normal heart sounds.  No  murmur heard. Pulmonary/Chest: Effort normal and breath sounds normal. No respiratory distress. She has no wheezes.  Abdominal: She exhibits no distension and no mass. There is no tenderness.  Neurological: She is alert and oriented to person, place, and time.  Tearful throughout the visit.   Psychiatric: Judgment and thought content normal.          Assessment & Plan:  Viral gastroenteritis- New. symptoms seem more consistent with a viral gastroenteritis than food poisoning. Will check CMET and CBC. rx with prn zofran. Pt is advised as follows:   Go to the ER if severe/worsening abdominal pain or if you are unable to keep down food/liquid. Let us know if your nausea is not improved in 3-4 days.  Tremor- pt notes that this is some better. Continue current dose of strattera. Her depression does not appear to be well controlled. Mother states pt has refused all med recommendations. She has an upcoming apppointment with Dr. [name] in August and will schedule with her therapist in the meantime.

## 2018-02-10 NOTE — Patient Instructions (Signed)
Complete lab work prior to leaving.  Please schedule follow up with Dr. Evelene CroonKaur. You may use zofran as needed for nausea. Go to the ER if severe/worsening abdominal pain or if you are unable to keep down food/liquid. Let us know if your nausea is not improved in 3-4 days.

## 2018-02-10 NOTE — Progress Notes (Signed)
   Subjective:    Patient ID: [name], female    DOB: 15-Jan-1996, 22 y.o.   MRN: [id number]  HPI    Review of Systems     Objective:   Physical Exam        Assessment & Plan:

## 2018-02-11 ENCOUNTER — Other Ambulatory Visit: Payer: Self-pay

## 2018-02-11 ENCOUNTER — Emergency Department (HOSPITAL_BASED_OUTPATIENT_CLINIC_OR_DEPARTMENT_OTHER)
Admission: EM | Admit: 2018-02-11 | Discharge: 2018-02-11 | Disposition: A | Discharge status: Other Acute Inpatient Hospital | Payer: BC Managed Care – PPO | Attending: Emergency Medicine | Admitting: Emergency Medicine

## 2018-02-11 ENCOUNTER — Encounter (HOSPITAL_BASED_OUTPATIENT_CLINIC_OR_DEPARTMENT_OTHER): Payer: Self-pay

## 2018-02-11 ENCOUNTER — Encounter (HOSPITAL_COMMUNITY): Payer: Self-pay | Admitting: *Deleted

## 2018-02-11 ENCOUNTER — Inpatient Hospital Stay (HOSPITAL_COMMUNITY)
Admission: AD | Admit: 2018-02-11 | Discharge: 2018-02-18 | DRG: 885 | Disposition: A | Discharge status: Home / Self Care | Payer: BC Managed Care – PPO | Source: Intra-hospital | Attending: Psychiatry | Admitting: Psychiatry

## 2018-02-11 ENCOUNTER — Other Ambulatory Visit: Payer: Self-pay | Admitting: Family Medicine

## 2018-02-11 DIAGNOSIS — G4700 Insomnia, unspecified: Secondary | ICD-10-CM | POA: Diagnosis present

## 2018-02-11 DIAGNOSIS — Z7951 Long term (current) use of inhaled steroids: Secondary | ICD-10-CM | POA: Diagnosis not present

## 2018-02-11 DIAGNOSIS — Z806 Family history of leukemia: Secondary | ICD-10-CM | POA: Diagnosis not present

## 2018-02-11 DIAGNOSIS — F332 Major depressive disorder, recurrent severe without psychotic features: Secondary | ICD-10-CM

## 2018-02-11 DIAGNOSIS — F312 Bipolar disorder, current episode manic severe with psychotic features: Secondary | ICD-10-CM | POA: Diagnosis not present

## 2018-02-11 DIAGNOSIS — Z79899 Other long term (current) drug therapy: Secondary | ICD-10-CM | POA: Insufficient documentation

## 2018-02-11 DIAGNOSIS — R440 Auditory hallucinations: Secondary | ICD-10-CM | POA: Diagnosis present

## 2018-02-11 DIAGNOSIS — R45851 Suicidal ideations: Secondary | ICD-10-CM | POA: Diagnosis present

## 2018-02-11 DIAGNOSIS — R251 Tremor, unspecified: Secondary | ICD-10-CM | POA: Insufficient documentation

## 2018-02-11 DIAGNOSIS — Z8249 Family history of ischemic heart disease and other diseases of the circulatory system: Secondary | ICD-10-CM | POA: Diagnosis not present

## 2018-02-11 DIAGNOSIS — R413 Other amnesia: Secondary | ICD-10-CM | POA: Diagnosis not present

## 2018-02-11 DIAGNOSIS — Z823 Family history of stroke: Secondary | ICD-10-CM | POA: Diagnosis not present

## 2018-02-11 DIAGNOSIS — Z881 Allergy status to other antibiotic agents status: Secondary | ICD-10-CM

## 2018-02-11 DIAGNOSIS — Z046 Encounter for general psychiatric examination, requested by authority: Secondary | ICD-10-CM | POA: Diagnosis not present

## 2018-02-11 DIAGNOSIS — F419 Anxiety disorder, unspecified: Secondary | ICD-10-CM | POA: Insufficient documentation

## 2018-02-11 DIAGNOSIS — Z888 Allergy status to other drugs, medicaments and biological substances status: Secondary | ICD-10-CM | POA: Diagnosis not present

## 2018-02-11 DIAGNOSIS — Z833 Family history of diabetes mellitus: Secondary | ICD-10-CM

## 2018-02-11 DIAGNOSIS — F315 Bipolar disorder, current episode depressed, severe, with psychotic features: Secondary | ICD-10-CM | POA: Diagnosis not present

## 2018-02-11 DIAGNOSIS — F909 Attention-deficit hyperactivity disorder, unspecified type: Secondary | ICD-10-CM | POA: Diagnosis present

## 2018-02-11 DIAGNOSIS — M791 Myalgia, unspecified site: Secondary | ICD-10-CM | POA: Diagnosis present

## 2018-02-11 DIAGNOSIS — E875 Hyperkalemia: Secondary | ICD-10-CM

## 2018-02-11 DIAGNOSIS — Z825 Family history of asthma and other chronic lower respiratory diseases: Secondary | ICD-10-CM

## 2018-02-11 DIAGNOSIS — R443 Hallucinations, unspecified: Secondary | ICD-10-CM

## 2018-02-11 DIAGNOSIS — Z9114 Patient's other noncompliance with medication regimen: Secondary | ICD-10-CM | POA: Diagnosis not present

## 2018-02-11 DIAGNOSIS — F3164 Bipolar disorder, current episode mixed, severe, with psychotic features: Secondary | ICD-10-CM | POA: Diagnosis present

## 2018-02-11 HISTORY — DX: Major depressive disorder, recurrent severe without psychotic features: F33.2

## 2018-02-11 LAB — BASIC METABOLIC PANEL
ANION GAP: 8 (ref 5–15)
BUN: 8 mg/dL (ref 6–20)
CALCIUM: 9.1 mg/dL (ref 8.9–10.3)
CO2: 27 mmol/L (ref 22–32)
Chloride: 104 mmol/L (ref 98–111)
Creatinine, Ser: 0.7 mg/dL (ref 0.44–1.00)
GFR calc Af Amer: >60 mL/min (ref >60)
GFR calc non Af Amer: >60 mL/min (ref >60)
GLUCOSE: 98 mg/dL (ref 70–99)
POTASSIUM: 3.8 mmol/L (ref 3.5–5.1)
Sodium: 139 mmol/L (ref 135–145)

## 2018-02-11 LAB — RAPID URINE DRUG SCREEN, HOSP PERFORMED
Amphetamines: NOT DETECTED
BENZODIAZEPINES: NOT DETECTED
Cocaine: NOT DETECTED
Opiates: NOT DETECTED
TETRAHYDROCANNABINOL: NOT DETECTED

## 2018-02-11 LAB — CBC WITH DIFFERENTIAL/PLATELET
BASOS ABS: 0 10*3/uL (ref 0.0–0.1)
Basophils Relative: 1 %
Eosinophils Absolute: 0.1 10*3/uL (ref 0.0–0.7)
Eosinophils Relative: 3 %
HEMATOCRIT: 39.1 % (ref 36.0–46.0)
Hemoglobin: 13.5 g/dL (ref 12.0–15.0)
LYMPHS ABS: 2.1 10*3/uL (ref 0.7–4.0)
LYMPHS PCT: 38 %
MCH: 30.2 pg (ref 26.0–34.0)
MCHC: 34.5 g/dL (ref 30.0–36.0)
MCV: 87.5 fL (ref 78.0–100.0)
Monocytes Absolute: 0.6 10*3/uL (ref 0.1–1.0)
Monocytes Relative: 10 %
NEUTROS ABS: 2.6 10*3/uL (ref 1.7–7.7)
Neutrophils Relative %: 48 %
Platelets: 338 10*3/uL (ref 150–400)
RBC: 4.47 MIL/uL (ref 3.87–5.11)
RDW: 13.3 % (ref 11.5–15.5)
WBC: 5.4 10*3/uL (ref 4.0–10.5)

## 2018-02-11 LAB — URINALYSIS, ROUTINE W REFLEX MICROSCOPIC
Bilirubin Urine: NEGATIVE
Glucose, UA: NEGATIVE mg/dL
Hgb urine dipstick: NEGATIVE
Ketones, ur: NEGATIVE mg/dL
LEUKOCYTES UA: NEGATIVE
Nitrite: NEGATIVE
PH: 5.5 (ref 5.0–8.0)
Protein, ur: NEGATIVE mg/dL

## 2018-02-11 LAB — PREGNANCY, URINE: Preg Test, Ur: NEGATIVE

## 2018-02-11 LAB — ETHANOL: Alcohol, Ethyl (B): <10 mg/dL (ref <10)

## 2018-02-11 MED ORDER — IBUPROFEN 400 MG PO TABS: 600.0000 mg | ORAL_TABLET | Freq: Three times a day (TID) | ORAL | Status: DC | PRN

## 2018-02-11 MED ORDER — LORAZEPAM 1 MG PO TABS
0.5000 mg | ORAL_TABLET | Freq: Four times a day (QID) | ORAL | Status: DC | PRN
  Administered 2018-02-11: 0.5 mg via ORAL
  Filled 2018-02-11: qty 1

## 2018-02-11 MED ORDER — LORAZEPAM 1 MG PO TABS
1.0000 mg | ORAL_TABLET | Freq: Once | ORAL | Status: AC
  Administered 2018-02-11: 1 mg via ORAL
  Filled 2018-02-11: qty 1

## 2018-02-11 MED ORDER — ONDANSETRON HCL 8 MG PO TABS: 4.0000 mg | ORAL_TABLET | Freq: Three times a day (TID) | ORAL | Status: DC | PRN

## 2018-02-11 MED ORDER — MAGNESIUM HYDROXIDE 400 MG/5ML PO SUSP: 30.0000 mL | Freq: Every day | ORAL | Status: DC | PRN

## 2018-02-11 MED ORDER — HYDROXYZINE HCL 25 MG PO TABS
25.0000 mg | ORAL_TABLET | Freq: Three times a day (TID) | ORAL | Status: DC | PRN
  Administered 2018-02-14 – 2018-02-16 (×3): 25 mg via ORAL
  Filled 2018-02-11 (×4): qty 1

## 2018-02-11 MED ORDER — ACETAMINOPHEN 325 MG PO TABS: 650.0000 mg | ORAL_TABLET | Freq: Four times a day (QID) | ORAL | Status: DC | PRN

## 2018-02-11 NOTE — ED Notes (Signed)
Call[facility]
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
## 2018-02-11 NOTE — Tx Team (Signed)
Initial Treatment Plan 02/11/2018 5:58 PM [name] ZOX:[id number]    PATIENT STRESSORS: Medication change or noncompliance   PATIENT STRENGTHS: Ability for insight Average or above average intelligence Capable of independent living General fund of knowledge   PATIENT IDENTIFIED PROBLEMS: Depression Paranoia "Get stabilized on my medicine"                     DISCHARGE CRITERIA:  Ability to meet basic life and health needs Improved stabilization in mood, thinking, and/or behavior Verbal commitment to aftercare and medication compliance  PRELIMINARY DISCHARGE PLAN: Attend aftercare/continuing care group Return to previous living arrangement  PATIENT/FAMILY INVOLVEMENT: This treatment plan has been presented to and reviewed with the patient, [name], and/or family member, .  The patient and family have been given the opportunity to ask questions and make suggestions.  Chloe Bluett, [place], [place]RN 02/11/2018, 5:58 PM

## 2018-02-11 NOTE — ED Provider Notes (Signed)
[facility] EMERGENCY DEPARTMENT Provider Note   CSN: [id number] Arrival date & time: 02/11/18  0522     History   Chief Complaint Chief Complaint  Patient presents with  . Hallucinations  . Tremors    HPI [name] is a 22 y.o. female.  Patient is a 22 year old female brought by her mother for evaluation of psychiatric issues.  She has a history of anxiety, bipolar disorder, and ADHD.  She reports she has been off of her ADHD med for the past 3 days.  During this period of time she has had increased forgetfulness, involuntary shaking of her head, and reports auditory and visual hallucinations.  She has been seeing people outside of her home who were not there and hearing voices.  She was seen by her primary doctor yesterday and was told if her symptoms worsen to come to the ER.  The patient is here requesting psychiatric admission.  She denies to me any suicidal or homicidal ideation.  The history is provided by the patient and a parent.    Past Medical History:  Diagnosis Date  . ADHD (attention deficit hyperactivity disorder)   . Allergy   . Anxiety   . Bipolar disorder (HCC)   . Eczema 12/11/13    Patient Active Problem List   Diagnosis Date Noted  . Pelvic pain 01/31/2018  . Genital herpes simplex 10/24/2017  . Anxiety 08/30/2017  . Severe bipolar I disorder, current or most recent episode depressed (HCC) 03/30/2017  . Syncope 03/03/2017  . STD exposure 05/13/2015  . Preventative health care 03/16/2013  . Bipolar disorder (HCC) 12/30/2012  . ADD (attention deficit disorder) 04/24/2011    History reviewed. No pertinent surgical history.   OB History   None      Home Medications    Prior to Admission medications   Medication Sig Start Date End Date Taking? Authorizing Provider  atomoxetine (STRATTERA) 25 MG capsule Take 1 capsule (25 mg total) by mouth daily. 01/31/18   [name], DO  Cholecalciferol (VITAMIN D PO) Take by  mouth.    [provider]  fluticasone (FLONASE) 50 MCG/ACT nasal spray Place 2 sprays into both nostrils daily. 11/13/17   [name], DO  norgestimate-ethinyl estradiol (SPRINTEC 28) 0.25-35 MG-MCG tablet Take 1 tablet by mouth daily. Birth control method. 12/10/17   Seabron Spates R, DO  ondansetron (ZOFRAN) 4 MG tablet Take 1 tablet (4 mg total) by mouth every 8 (eight) hours as needed for nausea or vomiting. 02/10/18   Sandford Craze, NP  valACYclovir (VALTREX) 1000 MG tablet Take 1 tablet (1,000 mg total) by mouth daily. 12/30/17   [name], DO    Family History Family History  Problem Relation Age of Onset  . Asthma Mother   . Stroke Father   . Heart disease Father   . Asthma Father   . Hypertension Father        pulmonary hypertension  . Heart disease Maternal Uncle   . Hypertension Maternal Grandmother   . COPD Maternal Grandmother   . Diabetes Maternal Grandfather   . Stroke Paternal Grandmother   . Diabetes Paternal Grandmother   . Diabetes Paternal Grandfather   . Leukemia Maternal Uncle     Social History Social History   Tobacco Use  . Smoking status: Never Smoker  . Smokeless tobacco: Never Used  Substance Use Topics  . Alcohol use: Yes    Comment: occ  .  Drug use: No     Allergies   Concerta [methylphenidate]; Clindamycin hcl; and Terbinafine hcl   Review of Systems Review of Systems  All other systems reviewed and are negative.    Physical Exam Updated Vital Signs BP (!) 121/97 (BP Location: Right Arm)   Pulse 100   Temp 98 F (36.7 C) (Oral)   Resp 16   Ht 5\' 4"  (1.626 m)   Wt 63 kg (139 lb)   SpO2 100%   BMI 23.86 kg/m   Physical Exam  Constitutional: She is oriented to person, place, and time. She appears well-developed and well-nourished. No distress.  HENT:  Head: Normocephalic and atraumatic.  Eyes: Pupils are equal, round, and reactive to light. EOM are normal.  Neck: Normal range of  motion. Neck supple.  Cardiovascular: Normal rate and regular rhythm. Exam reveals no gallop and no friction rub.  No murmur heard. Pulmonary/Chest: Effort normal and breath sounds normal. No respiratory distress. She has no wheezes.  Abdominal: Soft. Bowel sounds are normal. She exhibits no distension. There is no tenderness.  Musculoskeletal: Normal range of motion.  Neurological: She is alert and oriented to person, place, and time. No cranial nerve deficit. She exhibits normal muscle tone. Coordination normal.  Skin: Skin is warm and dry. She is not diaphoretic.  Psychiatric: Her speech is normal and behavior is normal. Her mood appears anxious. Thought content is paranoid. She expresses no homicidal and no suicidal ideation.  Nursing note and vitals reviewed.    ED Treatments / Results  Labs (all labs ordered are listed, but only abnormal results are displayed) Labs Reviewed  CBC WITH DIFFERENTIAL/PLATELET  BASIC METABOLIC PANEL  PREGNANCY, URINE  URINALYSIS, ROUTINE W REFLEX MICROSCOPIC  RAPID URINE DRUG SCREEN, HOSP PERFORMED  ETHANOL    EKG None  Radiology No results found.  Procedures Procedures (including critical care time)  Medications Ordered in ED Medications - No data to display   Initial Impression / Assessment and Plan / ED Course  I have reviewed the triage vital signs and the nursing notes.  Pertinent labs & imaging results that were available during my care of the patient were reviewed by me and considered in my medical decision making (see chart for details).  Laboratory studies unremarkable.  Patient appears medically cleared for TTS consultation.  They will determine the final disposition.  Final Clinical Impressions(s) / ED Diagnoses   Final diagnoses:  None    ED Discharge Orders    None       Geoffery Lyonselo, Rayaan Garguilo, MD 02/11/18 [phone]

## 2018-02-11 NOTE — ED Notes (Signed)
TTS assessment in progress, pt is smiling, alert and cooperative with care.
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
## 2018-02-11 NOTE — ED Notes (Signed)
Mother called out asking for assistance, mother states patient is "shaking her head a lot and it is starting to make her neck and head hurt". Was asking if she could have something to help with the shaking. RN Amy made aware.

## 2018-02-11 NOTE — ED Notes (Signed)
Pt and mother given refreshments per request, lights dimmed. Both deny any further c/o or needs.

## 2018-02-11 NOTE — ED Notes (Signed)
Pt states "I think someone is trying to get into this room and hurt me." mom states that pt seems agitated.

## 2018-02-11 NOTE — Progress Notes (Addendum)
[facility]2 Malachy Chamberakia Starkes NP is the accepting provider.  Dr. Landry MellowGreg Clary is the attending provider.  Call report to [phone]  [facility].   Pt is Voluntary.  Pt may be transported by [facility].  Timmothy EulerJean T. Kaylyn LimSutter, MSW, LCSWA Disposition Clinical Social Work [phone] (cell) [phone] (office)

## 2018-02-11 NOTE — ED Triage Notes (Signed)
BIB Mom w/ c/o AH/VH and pt feels like she is experiencing withdrawal s/s from not taking her psych meds. Pt denies SI/HI. Pt is A+OX4.

## 2018-02-11 NOTE — Progress Notes (Signed)
Pt was observed in the room, seen resting in bed with eyes closed. Pt did not attend wrap-up group this evening. Pt appears anxious/irritable in affect and mood. Pt denies SI/H/AVH/Pain at this time. Pt c/o of nausea this evening. Pt was given ginger-ale with relief. Pt appears paranoid and somatically focused. Pt states she wants to get medications adjusted. Pt states she doesn't like the side effects of meds so she doesn't take them. Encouragement and support provided. Will continue with POC.

## 2018-02-11 NOTE — Progress Notes (Signed)
Pt sign a 72 hr request for discharge on 02/11/18 @ 2033. See chart.

## 2018-02-12 ENCOUNTER — Telehealth: Payer: Self-pay

## 2018-02-12 MED ORDER — ARIPIPRAZOLE 5 MG PO TABS
5.0000 mg | ORAL_TABLET | Freq: Every day | ORAL | Status: DC
  Filled 2018-02-12 (×2): qty 1

## 2018-02-12 MED ORDER — RISPERIDONE 1 MG PO TABS
1.0000 mg | ORAL_TABLET | Freq: Every day | ORAL | Status: DC
  Administered 2018-02-12: 1 mg via ORAL
  Filled 2018-02-12 (×2): qty 1

## 2018-02-12 MED ORDER — LORAZEPAM 0.5 MG PO TABS
0.5000 mg | ORAL_TABLET | Freq: Four times a day (QID) | ORAL | Status: DC | PRN
  Administered 2018-02-13: 0.5 mg via ORAL
  Filled 2018-02-12: qty 1

## 2018-02-12 MED ORDER — RISPERIDONE 0.5 MG PO TABS
0.5000 mg | ORAL_TABLET | Freq: Once | ORAL | Status: AC
Start: 2018-02-12 — End: 2018-02-12
  Administered 2018-02-12: 0.5 mg via ORAL
  Filled 2018-02-12 (×2): qty 1

## 2018-02-12 NOTE — Plan of Care (Signed)
  Problem: Safety: Goal: Periods of time without injury will increase Outcome: Progressing   Problem: Health Behavior/Discharge Planning: Goal: Compliance with prescribed medication regimen will improve Outcome: Progressing DAR NOTE: Patient presents with anxious affect and mood.  Denies suicidal thoughts, auditory and visual hallucinations.  Rates depression at 5, hopelessness at 4, and anxiety at 8.  Maintained on routine safety checks.  Medications given as prescribed.  Support and encouragement offered as needed.  Attended group and participated.  States goal for today is "manage my tremors."  Patient remained in her room most of this shift.  Minimal interaction with staff.  Offered no complaint.
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
## 2018-02-12 NOTE — Progress Notes (Signed)
Pt refused labs this a.m. provided with education and encouragement.

## 2018-02-12 NOTE — Therapy (Signed)
Occupational Therapy Group Treatment Note  Date:  02/12/2018 Time:  3:25 PM  Group Topic/Focus:  Stress Management  Participation Level:  Active  Participation Quality:  Appropriate and Sharing  Affect:  Flat  Cognitive:  Appropriate  Insight: Improving  Engagement in Group:  Engaged  Modes of Intervention:  Activity, Discussion, Education and Socialization  Additional Comments:    S: "Deep breathing works for me sometimes"  O: Stress management group completed to use as productive coping strategy, to help mitigate maladaptive coping to integrate in functional BADL/IADL. Stress management tool worksheet discussed to educate on unhealthy vs healthy coping skills to manage stress to improve community integration. Coping strategies taught include: relaxation based- deep breathing, counting to 10, taking a 1 minute vacation, acceptance, stress balls, relaxation audio/video, visual/mental imagery. Positive mental attitude- gratitude, acceptance, cognitive reframing, positive self talk, anger management. Coping skills bingo played with education given on variety of coping skills between bingo calls. Pts encouraged to share experience with various coping skills and share what has worked for them with others. Coloring and relaxation guide handouts given at the end of the session.   A: Pt presents to group with flat affect, but participatory and engaged throughout session Pt frequently asking questions to obtain more information from facilitator. Stress management tools worksheet completed, pt stating she currently suppresses emotions which causes her a lot of stress. Pt is intrigued to explore a variety of relaxation skills and positive affirmations. Pt states she is going to do an Internet search to have plenty of options when she is able. Pt engaged in coping skills bingo, stating she likes the idea of popping bubble wrap when stressed. Pt eager to accept handouts at end of session.  P: Pt  provided with education on stress management activities to implement into daily routine. Handouts given to facilitate carryover when reintegrating into community   [facility] Memorial HospitalKaylee Renie [name], [place]MS[place], OTR/L  AvnetKaylee Thad [name] 02/12/2018, 3:25 PM

## 2018-02-12 NOTE — Telephone Encounter (Signed)
TCM ED follow up call made. Unable to leave message for return call. Mail;box full.

## 2018-02-13 DIAGNOSIS — F312 Bipolar disorder, current episode manic severe with psychotic features: Secondary | ICD-10-CM

## 2018-02-13 MED ORDER — OLANZAPINE 7.5 MG PO TABS
7.5000 mg | ORAL_TABLET | Freq: Every day | ORAL | Status: DC
  Administered 2018-02-13 – 2018-02-15 (×3): 7.5 mg via ORAL
  Filled 2018-02-13 (×4): qty 1

## 2018-02-13 MED ORDER — OLANZAPINE 2.5 MG PO TABS
2.5000 mg | ORAL_TABLET | Freq: Once | ORAL | Status: AC
  Administered 2018-02-13: 2.5 mg via ORAL
  Filled 2018-02-13 (×2): qty 1

## 2018-02-13 MED ORDER — ZIPRASIDONE MESYLATE 20 MG IM SOLR: 20.0000 mg | Freq: Two times a day (BID) | INTRAMUSCULAR | Status: DC | PRN

## 2018-02-13 MED ORDER — LORAZEPAM 1 MG PO TABS
1.0000 mg | ORAL_TABLET | Freq: Four times a day (QID) | ORAL | Status: DC | PRN
  Administered 2018-02-13 – 2018-02-16 (×4): 1 mg via ORAL
  Filled 2018-02-13 (×4): qty 1

## 2018-02-13 NOTE — Progress Notes (Signed)
Patient ID: [name], female   DOB: Nov 14, 1995, 22 y.o.   MRN: [id number]  Nursing Progress Note [id number]  Data: Patient presents with paranoid/delusional thinking and complaints of AVH. This morning patient reported "chest tightness", "heart racing" and was observed to be trembling outside the doctors office. MD notified and examined. Patient medicated as ordered and had a positive response to medications. Patient was transferred to [address] hall per MD order when a bed was made available. Patient complaint with ordered medications. Patient completed self-inventory sheet and rates depression, hopelessness, and anxiety 8,8,10 respectively. Patient rates their sleep and appetite as fair/good respectively. Patient states goal for today is to "let everyone know that everything is going to be alright". Patient currently denies SI/HI/AVH but is observed to be experiencing internal stimuli. Patient seen resting in her room throughout the day and visiting with family.   Action: Patient educated about and provided medication per provider's orders. Patient safety maintained with q15 min safety checks and frequent rounding. Low fall risk precautions in place. Emotional support given. 1:1 interaction and active listening provided. Patient encouraged to attend meals and groups. Patient encouraged to work on treatment plan and goals. Labs, vital signs and patient behavior monitored throughout shift.   Response: Patient agrees to come to staff if any thoughts of SI/HI develop or if patient develops intention of acting on thoughts. Patient remains safe on the unit at this time. Patient is interacting with peers appropriately on the unit. Will continue to support and monitor.

## 2018-02-13 NOTE — Progress Notes (Signed)
Adult Psychoeducational Group Note  Date:  02/13/2018 Time:  1:14 AM  Group Topic/Focus:  Wrap-Up Group:   The focus of this group is to help patients review their daily goal of treatment and discuss progress on daily workbooks.  Participation Level:  Active  Participation Quality:  Appropriate  Affect:  Appropriate  Cognitive:  Appropriate  Insight: Appropriate  Engagement in Group:  Engaged  Modes of Intervention:  Discussion  Additional Comments:  Pt expressed that she had a crappy beginning of the day but going outside and having an opportunity to process her thoughts made it more of a positive day. Pt rates her day at a 7/10.  Mykelle Cockerell 02/13/2018, 1:14 AM

## 2018-02-13 NOTE — Progress Notes (Signed)
D: Patient denies SI or HI but endorses AVH. Patient presents as flat, preoccupied and paranoid.  After visiting with her family she spoke with this Clinical research associatewriter and endorsed hallucinations of a man coming into her room with a collared shirt on who went in her bathroom and states that she tried to open the bathroom door and couldn't.  Pt. Also reported that she was worried about a "white powder" on the phone in the dayroom and that she thinks her patient bracelet is speaking to her.  Pt.'s family stated that the patient also told them that she thought a poodle was talking to her about people talking about her and harassing her.  A: Patient given emotional support from RN. Patient encouraged to come to staff with concerns and/or questions. Patient's medication routine continued. Patient's orders and plan of care reviewed.   R: Patient remains appropriate and cooperative. Will continue to monitor patient q15 minutes for safety.

## 2018-02-13 NOTE — Plan of Care (Signed)
D: Pt denies SI/HI/AVH. Pt is pleasant and cooperative. Pt presents paranoid and fearful at times. Pt family stated pt got dizzy after taking her shower earlier. Pt was educated on the medications and encouraged to get up slowly and dangle her feet for a few minutes on the side of the bed before she gets up. Pt was observed this evening and pt appeared steady on her feet.   A: Pt was offered support and encouragement. Pt was given scheduled medications. Pt was encourage to attend groups. Q 15 minute checks were done for safety.   R:Pt attends groups and interacts well with peers and staff. Pt is taking medication. Pt has no complaints.Pt receptive to treatment and safety maintained on unit.   Problem: Education: Goal: Emotional status will improve Outcome: Progressing   Problem: Education: Goal: Mental status will improve Outcome: Progressing   Problem: Activity: Goal: Sleeping patterns will improve Outcome: Progressing   Problem: Safety: Goal: Periods of time without injury will increase Outcome: Progressing

## 2018-02-13 NOTE — H&P (Signed)
Psychiatric Admission Assessment Adult  Patient Identification: [name] MRN:  [id number] Date of Evaluation:  02/13/2018 Chief Complaint:  MDD Principal Diagnosis: <principal problem not specified> Diagnosis:   Patient Active Problem List   Diagnosis Date Noted  . MDD (major depressive disorder), recurrent severe, without psychosis (HCC) [F33.2] 02/11/2018  . Pelvic pain [R10.2] 01/31/2018  . Genital herpes simplex [A60.00] 10/24/2017  . Anxiety [F41.9] 08/30/2017  . Severe bipolar I disorder, current or most recent episode depressed (HCC) [F31.4] 03/30/2017  . Syncope [R55] 03/03/2017  . STD exposure [Z20.2] 05/13/2015  . Preventative health care [Z00.00] 03/16/2013  . Bipolar disorder (HCC) [F31.9] 12/30/2012  . ADD (attention deficit disorder) [F98.8] 04/24/2011   History of Present Illness: Patient is seen and examined.  Patient is a 22 year old female with a past psychiatric history significant for bipolar disorder who presented to the [facility] regional emergency department on 7/3 with psychotic symptoms.  The patient stated that she felt as though she was having involuntary tremors believed to be a parkinsonian-like syndrome.  The patient's mother was with the patient and gave permission for her to stay during the assessment.  The patient was having involuntary tremors.  The patient been previously hospitalized here a year ago and diagnosed with bipolar disorder and was discharged on Abilify.  She stated that she had been "misdiagnosed with bipolar disorder" and saw a local psychiatrist ( Dr [name]) who prescribed her stimulants and Xanax.  The patient stated that she was taking these medicines.  She stated that 1 of the reasons she came to the emergency room was that she believed her roommate had poisoned her recently.  She was unclear as to why she had poison her.  She thought that her tremors were secondary to withdrawal.  That she does not believe that she has bipolar disorder.   On admission she was paranoid, agitated and having delusional symptoms.  She was admitted to the hospital for evaluation and stabilization.  She was started on Risperdal.  She was given half a milligram during the day, and 1 mg p.o. nightly.  This morning she was having the involuntary jerking movements again.  I evaluated the patient, and did neurological maneuvers.  It was quite clear that this was volitional.  She is quite psychotic.  She is still very paranoid.  She was given Zyprexa 2.5 mg p.o. x1, and her lorazepam was increased to 1 mg p.o. every 6 hours as needed anxiety.  She was able to be calmed. Associated Signs/Symptoms: Depression Symptoms:  depressed mood, anhedonia, insomnia, psychomotor agitation, fatigue, feelings of worthlessness/guilt, difficulty concentrating, hopelessness, suicidal thoughts without plan, anxiety, loss of energy/fatigue, disturbed sleep, (Hypo) Manic Symptoms:  Delusions, Distractibility, Impulsivity, Irritable Mood, Labiality of Mood, Anxiety Symptoms:  Excessive Worry, Psychotic Symptoms:  Delusions, Paranoia, PTSD Symptoms: Negative Total Time spent with patient: 45 minutes  Past Psychiatric History: Patient is had a previous [facility] when she was diagnosed with bipolar disorder.  As stated above she seen a local psychiatrist and it stopped her Abilify was taking stimulants and Xanax.  Is the patient at risk to self? Yes.    Has the patient been a risk to self in the past 6 months? Yes.    Has the patient been a risk to self within the distant past? No.  Is the patient a risk to others? No.  Has the patient been a risk to others in the past 6 months? No.  Has the patient  been a risk to others within the distant past? No.   Prior Inpatient Therapy:   Prior Outpatient Therapy:    Alcohol Screening: 1. How often do you have a drink containing alcohol?: 2 to 4 times a month 2. How many drinks  containing alcohol do you have on a typical day when you are drinking?: 1 or 2 3. How often do you have six or more drinks on one occasion?: Less than monthly AUDIT-C Score: 3 4. How often during the last year have you found that you were not able to stop drinking once you had started?: Never 5. How often during the last year have you failed to do what was normally expected from you becasue of drinking?: Never 6. How often during the last year have you needed a first drink in the morning to get yourself going after a heavy drinking session?: Never 7. How often during the last year have you had a feeling of guilt of remorse after drinking?: Never 8. How often during the last year have you been unable to remember what happened the night before because you had been drinking?: Never 9. Have you or someone else been injured as a result of your drinking?: No 10. Has a relative or friend or a doctor or another health worker been concerned about your drinking or suggested you cut down?: No Alcohol Use Disorder Identification Test Final Score (AUDIT): 3 Intervention/Follow-up: AUDIT Score <7 follow-up not indicated Substance Abuse History in the last 12 months:  No. Consequences of Substance Abuse: Negative Previous Psychotropic Medications: Yes  Psychological Evaluations: Yes  Past Medical History:  Past Medical History:  Diagnosis Date  . ADHD (attention deficit hyperactivity disorder)   . Allergy   . Anxiety   . Bipolar disorder (HCC)   . Eczema 12/11/13   History reviewed. No pertinent surgical history. Family History:  Family History  Problem Relation Age of Onset  . Asthma Mother   . Stroke Father   . Heart disease Father   . Asthma Father   . Hypertension Father        pulmonary hypertension  . Heart disease Maternal Uncle   . Hypertension Maternal Grandmother   . COPD Maternal Grandmother   . Diabetes Maternal Grandfather   . Stroke Paternal Grandmother   . Diabetes Paternal  Grandmother   . Diabetes Paternal Grandfather   . Leukemia Maternal Uncle    Family Psychiatric  History: Denied Tobacco Screening: Have you used any form of tobacco in the last 30 days? (Cigarettes, Smokeless Tobacco, Cigars, and/or Pipes): No Social History:  Social History   Substance and Sexual Activity  Alcohol Use Yes   Comment: occ     Social History   Substance and Sexual Activity  Drug Use No    Additional Social History:                           Allergies:   Allergies  Allergen Reactions  . Concerta [Methylphenidate] Other (See Comments)    hallucinations  . Clindamycin Hcl Rash  . Terbinafine Hcl Nausea And Vomiting   Lab Results: No results found for this or any previous visit (from the past 48 hour(s)).  Blood Alcohol level:  Lab Results  Component Value Date   ETH <10 02/11/2018   ETH <5 03/29/2017    Metabolic Disorder Labs:  No results found for: HGBA1C, MPG No results found for: PROLACTIN Lab Results  Component Value  Date   CHOL 148 11/02/2016   TRIG 77 11/02/2016   HDL 64 11/02/2016   CHOLHDL 2.3 11/02/2016   VLDL 15 11/02/2016   LDLCALC 69 11/02/2016   LDLCALC 94 08/02/2014    Current Medications: Current Facility-Administered Medications  Medication Dose Route Frequency Provider Last Rate Last Dose  . acetaminophen (TYLENOL) tablet 650 mg  650 mg Oral Q6H PRN Truman Hayward, FNP      . hydrOXYzine (ATARAX/VISTARIL) tablet 25 mg  25 mg Oral TID PRN Truman Hayward, FNP      . LORazepam (ATIVAN) tablet 1 mg  1 mg Oral Q6H PRN Antonieta Pert, MD   1 mg at 02/13/18 0950  . magnesium hydroxide (MILK OF MAGNESIA) suspension 30 mL  30 mL Oral Daily PRN Truman Hayward, FNP       PTA Medications: Medications Prior to Admission  Medication Sig Dispense Refill Last Dose  . atomoxetine (STRATTERA) 25 MG capsule Take 1 capsule (25 mg total) by mouth daily. 30 capsule 2 Past Month at Unknown time  . cholecalciferol (VITAMIN D)  1000 units tablet Take 1,000 Units by mouth daily.    Past Month at Unknown time  . fluticasone (FLONASE) 50 MCG/ACT nasal spray Place 2 sprays into both nostrils daily. 16 g 6 Past Month at Unknown time  . norgestimate-ethinyl estradiol (SPRINTEC 28) 0.25-35 MG-MCG tablet Take 1 tablet by mouth daily. Birth control method. 84 tablet 0 Past Week at Unknown time  . valACYclovir (VALTREX) 1000 MG tablet Take 1 tablet (1,000 mg total) by mouth daily. 90 tablet 3 Past Week at Unknown time    Musculoskeletal: Strength & Muscle Tone: within normal limits Gait & Station: normal Patient leans: N/A  Psychiatric Specialty Exam: Physical Exam  Nursing note and vitals reviewed. Constitutional: She is oriented to person, place, and time. She appears well-developed and well-nourished.  HENT:  Head: Normocephalic and atraumatic.  Respiratory: Effort normal.  Neurological: She is alert and oriented to person, place, and time.    ROS  Blood pressure 123/81, pulse (!) 118, temperature 98.4 F (36.9 C), temperature source Oral, resp. rate 18, height 5\' 4"  (1.626 m), weight 63.5 kg (140 lb), SpO2 100 %.Body mass index is 24.03 kg/m.  General Appearance: Disheveled  Eye Contact:  Fair  Speech:  Pressured  Volume:  Decreased  Mood:  Anxious, Depressed, Dysphoric and Irritable  Affect:  Congruent  Thought Process:  Disorganized  Orientation:  Full (Time, Place, and Person)  Thought Content:  Delusions and Paranoid Ideation  Suicidal Thoughts:  No  Homicidal Thoughts:  No  Memory:  Immediate;   Fair Recent;   Fair Remote;   Fair  Judgement:  Impaired  Insight:  Lacking  Psychomotor Activity:  Increased and Tremor  Concentration:  Concentration: Fair and Attention Span: Fair  Recall:  Poor  Fund of Knowledge:  Fair  Language:  Fair  Akathisia:  Negative  Handed:  Right  AIMS (if indicated):     Assets:  Desire for Improvement Financial Resources/Insurance Housing Physical  Health Resilience Social Support  ADL's:  Intact  Cognition:  WNL  Sleep:  Number of Hours: 5.5    Treatment Plan Summary: Daily contact with patient to assess and evaluate symptoms and progress in treatment, Medication management and Plan Patient is a 22 year old female with a past psychiatric history significant for bipolar disorder with psychotic features versus schizoaffective disorder plus or minus benzodiazepine withdrawal or psychosis secondarily worsened by stimulants.  We  attempted to give her Risperdal last night, but it does not appear to be effective.  This was stopped, and I gave her 2.5 mg of Zyprexa this a.m.  I am going to give her 10 mg p.o. nightly tonight.  I have also increased her lorazepam to 1 mg every 6 hours as needed agitation.  We will monitor today for any sedation secondary to the olanzapine 2.5 mg that she was given x1.  I am also going to write for Geodon IM in case there is worsening during the night.  Observation Level/Precautions:  15 minute checks  Laboratory:  Chemistry Profile  Psychotherapy:    Medications:    Consultations:    Discharge Concerns:    Estimated LOS:  Other:     Physician Treatment Plan for Primary Diagnosis: <principal problem not specified> Long Term Goal(s): Improvement in symptoms so as ready for discharge  Short Term Goals: Ability to identify changes in lifestyle to reduce recurrence of condition will improve, Ability to verbalize feelings will improve, Ability to disclose and discuss suicidal ideas, Ability to demonstrate self-control will improve, Ability to identify and develop effective coping behaviors will improve and Ability to maintain clinical measurements within normal limits will improve  Physician Treatment Plan for Secondary Diagnosis: Active Problems:   MDD (major depressive disorder), recurrent severe, without psychosis (HCC)  Long Term Goal(s): Improvement in symptoms so as ready for discharge  Short Term Goals:  Ability to identify changes in lifestyle to reduce recurrence of condition will improve, Ability to verbalize feelings will improve, Ability to disclose and discuss suicidal ideas, Ability to demonstrate self-control will improve, Ability to identify and develop effective coping behaviors will improve and Ability to maintain clinical measurements within normal limits will improve  I certify that inpatient services furnished can reasonably be expected to improve the patient's condition.    Antonieta PertGreg Lawson Alla Sloma, MD 7/4/20191:51 PM

## 2018-02-13 NOTE — Progress Notes (Signed)
Assuming care- SBARR received from Erika RN @ 0210 pt resting in bed with eyes closed. Respirations even and unlabored. Pt continues to remain safe on the unit/ Observed by 15 min rounds. RN will continue to monitor. 

## 2018-02-14 DIAGNOSIS — F332 Major depressive disorder, recurrent severe without psychotic features: Secondary | ICD-10-CM

## 2018-02-14 NOTE — Progress Notes (Signed)
Patient ID: [name], female   DOB: 09/19/1995, 22 y.o.   MRN: [id number]  Nursing Progress Note [id number]  Data: Patient presents initially withdrawn, anxious and paranoid this morning but is observed to be brighter and more interactive with peers throughout the day.  Patient complaint with scheduled medications. Patient denies pain/physical complaints. Patient is seen attending groups and visible in the milieu. Patient currently denies SI/HI/AVH.   Action: Patient educated about and provided medication per provider's orders. Patient safety maintained with q15 min safety checks and frequent rounding. Low fall risk precautions in place. Emotional support given. 1:1 interaction and active listening provided. Patient encouraged to attend meals and groups. Patient encouraged to work on treatment plan and goals. Labs, vital signs and patient behavior monitored throughout shift.   Response: Patient agrees to come to staff if any thoughts of SI/HI develop or if patient develops intention of acting on thoughts. Patient remains safe on the unit at this time. Patient is interacting with peers appropriately on the unit. Will continue to support and monitor.

## 2018-02-14 NOTE — Tx Team (Signed)
Interdisciplinary Treatment and Diagnostic Plan Update  02/14/2018 Time of Session: 0830AM DOYNE [name] MRN: [id number]  Principal Diagnosis: Bipolar Disorder, current episode manic,  with psychotic features  Secondary Diagnoses: Active Problems:   MDD (major depressive disorder), recurrent severe, without psychosis (HCC)   Bipolar disorder, current episode manic severe with psychotic features (HCC)   Current Medications:  Current Facility-Administered Medications  Medication Dose Route Frequency Provider Last Rate Last Dose  . acetaminophen (TYLENOL) tablet 650 mg  650 mg Oral Q6H PRN Truman Hayward, FNP      . hydrOXYzine (ATARAX/VISTARIL) tablet 25 mg  25 mg Oral TID PRN Truman Hayward, FNP      . LORazepam (ATIVAN) tablet 1 mg  1 mg Oral Q6H PRN Antonieta Pert, MD   1 mg at 02/14/18 4098  . magnesium hydroxide (MILK OF MAGNESIA) suspension 30 mL  30 mL Oral Daily PRN Truman Hayward, FNP      . OLANZapine (ZYPREXA) tablet 7.5 mg  7.5 mg Oral QHS Antonieta Pert, MD   7.5 mg at 02/13/18 2123  . ziprasidone (GEODON) injection 20 mg  20 mg Intramuscular Q12H PRN Antonieta Pert, MD       PTA Medications: Medications Prior to Admission  Medication Sig Dispense Refill Last Dose  . atomoxetine (STRATTERA) 25 MG capsule Take 1 capsule (25 mg total) by mouth daily. 30 capsule 2 Past Month at Unknown time  . cholecalciferol (VITAMIN D) 1000 units tablet Take 1,000 Units by mouth daily.    Past Month at Unknown time  . fluticasone (FLONASE) 50 MCG/ACT nasal spray Place 2 sprays into both nostrils daily. 16 g 6 Past Month at Unknown time  . norgestimate-ethinyl estradiol (SPRINTEC 28) 0.25-35 MG-MCG tablet Take 1 tablet by mouth daily. Birth control method. 84 tablet 0 Past Week at Unknown time  . valACYclovir (VALTREX) 1000 MG tablet Take 1 tablet (1,000 mg total) by mouth daily. 90 tablet 3 Past Week at Unknown time    Patient Stressors: Medication change or  noncompliance  Patient Strengths: Ability for insight Average or above average intelligence Capable of independent living General fund of knowledge  Treatment Modalities: Medication Management, Group therapy, Case management,  1 to 1 session with clinician, Psychoeducation, Recreational therapy.   Physician Treatment Plan for Primary Diagnosis:  Bipolar Disorder, current episode manic,  with psychotic features Long Term Goal(s): Improvement in symptoms so as ready for discharge Improvement in symptoms so as ready for discharge   Short Term Goals: Ability to identify changes in lifestyle to reduce recurrence of condition will improve Ability to verbalize feelings will improve Ability to disclose and discuss suicidal ideas Ability to demonstrate self-control will improve Ability to identify and develop effective coping behaviors will improve Ability to maintain clinical measurements within normal limits will improve Ability to identify changes in lifestyle to reduce recurrence of condition will improve Ability to verbalize feelings will improve Ability to disclose and discuss suicidal ideas Ability to demonstrate self-control will improve Ability to identify and develop effective coping behaviors will improve Ability to maintain clinical measurements within normal limits will improve  Medication Management: Evaluate patient's response, side effects, and tolerance of medication regimen.  Therapeutic Interventions: 1 to 1 sessions, Unit Group sessions and Medication administration.  Evaluation of Outcomes: Progressing  Physician Treatment Plan for Secondary Diagnosis: Active Problems:   MDD (major depressive disorder), recurrent severe, without psychosis (HCC)   Bipolar disorder, current episode manic severe with psychotic features (HCC)  Long Term Goal(s): Improvement in symptoms so as ready for discharge Improvement in symptoms so as ready for discharge   Short Term Goals:  Ability to identify changes in lifestyle to reduce recurrence of condition will improve Ability to verbalize feelings will improve Ability to disclose and discuss suicidal ideas Ability to demonstrate self-control will improve Ability to identify and develop effective coping behaviors will improve Ability to maintain clinical measurements within normal limits will improve Ability to identify changes in lifestyle to reduce recurrence of condition will improve Ability to verbalize feelings will improve Ability to disclose and discuss suicidal ideas Ability to demonstrate self-control will improve Ability to identify and develop effective coping behaviors will improve Ability to maintain clinical measurements within normal limits will improve     Medication Management: Evaluate patient's response, side effects, and tolerance of medication regimen.  Therapeutic Interventions: 1 to 1 sessions, Unit Group sessions and Medication administration.  Evaluation of Outcomes: Progressing   RN Treatment Plan for Primary Diagnosis:  Bipolar Disorder, current episode manic,  with psychotic features Long Term Goal(s): Knowledge of disease and therapeutic regimen to maintain health will improve  Short Term Goals: Ability to remain free from injury will improve, Ability to participate in decision making will improve and Ability to verbalize feelings will improve  Medication Management: RN will administer medications as ordered by provider, will assess and evaluate patient's response and provide education to patient for prescribed medication. RN will report any adverse and/or side effects to prescribing provider.  Therapeutic Interventions: 1 on 1 counseling sessions, Psychoeducation, Medication administration, Evaluate responses to treatment, Monitor vital signs and CBGs as ordered, Perform/monitor CIWA, COWS, AIMS and Fall Risk screenings as ordered, Perform wound care treatments as ordered.  Evaluation  of Outcomes: Progressing   LCSW Treatment Plan for Primary Diagnosis:  Bipolar Disorder, current episode manic,  with psychotic features Long Term Goal(s): Safe transition to appropriate next level of care at discharge, Engage patient in therapeutic group addressing interpersonal concerns.  Short Term Goals: Engage patient in aftercare planning with referrals and resources, Increase emotional regulation and Facilitate acceptance of mental health diagnosis and concerns  Therapeutic Interventions: Assess for all discharge needs, 1 to 1 time with Social worker, Explore available resources and support systems, Assess for adequacy in community support network, Educate family and significant other(s) on suicide prevention, Complete Psychosocial Assessment, Interpersonal group therapy.  Evaluation of Outcomes: Progressing   Progress in Treatment: Attending groups: Yes. Participating in groups: Yes. Taking medication as prescribed: Yes. Toleration medication: Yes. Family/Significant other contact made: No, will contact:  pt's mother to obtain collateral information and complete SPE. Patient understands diagnosis: Yes. Discussing patient identified problems/goals with staff: Yes. Medical problems stabilized or resolved: Yes. Denies suicidal/homicidal ideation: Yes. Issues/concerns per patient self-inventory: No. Other: n/a   New problem(s) identified: No, Describe:  n/a  New Short Term/Long Term Goal(s): decrease in symptoms of psychosis, medication management for mood stabilization; elimination of SI thoughts; development of comprehensive mental wellness/sobriety plan.   Patient Goals:  "To get stabilized on my medicine."   Discharge Plan or Barriers: Pt sees Dr. [name] for medication management and Thea Silversmith for counseling at [facility]. Pt plans to return home at discharge with her parents. [facility] pamphlet, Mobile Crisis information,  provided to patient for additional community  support and resources.    Reason for Continuation of Hospitalization: Anxiety Delusions  Depression Hallucinations Mania Medication stabilization  Estimated Length of Stay: Tuesday, 02/18/18  Attendees: Patient: [name]  02/14/2018 10:25 AM  Physician: Dr. Viviano SimasMaurer MD; Dr. Jola Babinskilary MD 02/14/2018 10:25 AM  Nursing: Marchelle FolksAmanda RN; Alyssa RN 02/14/2018 10:25 AM  RN Care Manager:x 02/14/2018 10:25 AM  Social Worker: Corrie MckusickHeather Johnice Riebe LCSW 02/14/2018 10:25 AM  Recreational Therapist: x 02/14/2018 10:25 AM  Other: Armandina StammerAgnes Nwoko NP 02/14/2018 10:25 AM  Other:  02/14/2018 10:25 AM  Other: 02/14/2018 10:25 AM    Scribe for Treatment Team: Rona RavensHeather S Deania Siguenza, LCSW 02/14/2018 10:25 AM

## 2018-02-14 NOTE — Progress Notes (Signed)
Patient shared with the group that she is feeling better with the medication regimen that she is following. She also shared that she spent her day coloring and playing board games.

## 2018-02-14 NOTE — Progress Notes (Signed)
Recreation Therapy Notes  INPATIENT RECREATION THERAPY ASSESSMENT  Patient Details Name: Jaclyn SladeShekinah Y Rylander MRN: [id number] DOB: 1995/08/31 Today's Date: 02/14/2018       Information Obtained From: Patient  Able to Participate in Assessment/Interview: Yes  Patient Presentation: Alert, Oriented  Reason for Admission (Per Patient): Other (Comments)(Depression)  Patient Stressors: Friends(Pt stated ex bestfriend is a stressor)  Coping Skills:   Sports, TV, Music, Exercise, Meditate, Talk, Art, Prayer, Avoidance, Read, Dance, Hot Bath/Shower  Leisure Interests (2+):  Exercise - Lifting Weights, Sports - Exercise (Comment), Sports - Dance(Treadmill)  Frequency of Recreation/Participation: Weekly  Awareness of Community Resources:  Yes  Community Resources:  [place], [place] park)  Current Use: Yes  If no, Barriers?:    Expressed Interest in [employer] CorporationCommunity Resource Information: Yes  [place]  Patient Main Form of Transportation: Set designerCar  Patient Strengths:  Positive attitude; Praying  Patient Identified Areas of Improvement:  Depression; Coping skills  Patient Goal for Hospitalization:  "Be able to rest, get better and get regulated on medicine"  Current SI (including self-harm):  No  Current HI:  No  Current AVH: No  Staff Intervention Plan: Group Attendance, Collaborate with Interdisciplinary Treatment Team  Consent to Intern Participation: N/A    Caroll RancherMarjette Danylle Ouk, LRT/CTRS  Lillia AbedLindsay, Osiris Odriscoll A 02/14/2018, 1:33 PM

## 2018-02-14 NOTE — Progress Notes (Signed)
[facility] MD Progress Note  02/14/2018 4:01 PM [name]  MRN:  [id number]   Subjective:  "I am feeling better today".   Principal Problem: Bipolar disorder, current episode manic severe with psychotic features [facility]) Diagnosis:   Patient Active Problem List   Diagnosis Date Noted  . Bipolar disorder, current episode manic severe with psychotic features (HCC) [F31.2]   . MDD (major depressive disorder), recurrent severe, without psychosis (HCC) [F33.2] 02/11/2018  . Pelvic pain [R10.2] 01/31/2018  . Genital herpes simplex [A60.00] 10/24/2017  . Anxiety [F41.9] 08/30/2017  . Severe bipolar I disorder, current or most recent episode depressed (HCC) [F31.4] 03/30/2017  . Syncope [R55] 03/03/2017  . STD exposure [Z20.2] 05/13/2015  . Preventative health care [Z00.00] 03/16/2013  . Bipolar disorder (HCC) [F31.9] 12/30/2012  . ADD (attention deficit disorder) [F98.8] 04/24/2011   Total Time spent with patient: 35 minutes  History of Present Illness: Patient is seen and examined.  Patient is a 22 year old female with a past psychiatric history significant for bipolar disorder who presented to the [facility] emergency department on 7/3 with psychotic symptoms.  The patient stated that she felt as though she was having involuntary tremors believed to be a parkinsonian-like syndrome.  The patient's mother was with the patient and gave permission for her to stay during the assessment.  The patient was having involuntary tremors.  The patient been previously hospitalized here a year ago and diagnosed with bipolar disorder and was discharged on Abilify.  She stated that she had been "misdiagnosed with bipolar disorder" and saw a local psychiatrist ( Dr [name]) who prescribed her stimulants and Xanax.  The patient stated that she was taking these medicines.  She stated that 1 of the reasons she came to the emergency room was that she believed her roommate had poisoned her recently.  She was unclear  as to why she had poison her.  She thought that her tremors were secondary to withdrawal.  That she does not believe that she has bipolar disorder.  On admission she was paranoid, agitated and having delusional symptoms.  She was admitted to the hospital for evaluation and stabilization.   [name] (goes by [name]) is seen, chart reviewed. The chart findings discussed with the treatment team.  Patient reports she is feeling better on Zyprexa after having "reaction to Risperdal".  Reports a good appetite and states she is resting well. [name] reports that she still feels depressed, but denies any symptoms of SI/HI, AVH, delusional thoughts or paranoia, and does not appear to be responding to any internal stimuli. Patient is visible on the milieu.  Patient seen attending groups  session with active and engaged participation. [name] has agreed to continue her current plan of care already in progress. She denies any other issues or concerns. Support encouragement reassurance was provided.  Past Psychiatric History: Patient is had a previous [facility] when she was diagnosed with bipolar disorder.  As stated above she seen a local psychiatrist and it stopped her Abilify was taking stimulants and Xanax.    Past Medical History:  Past Medical History:  Diagnosis Date  . ADHD (attention deficit hyperactivity disorder)   . Allergy   . Anxiety   . Bipolar disorder (HCC)   . Eczema 12/11/13   History reviewed. No pertinent surgical history. Family History:  Family History  Problem Relation Age of Onset  . Asthma Mother   . Stroke Father   . Heart disease Father   .  Asthma Father   . Hypertension Father        pulmonary hypertension  . Heart disease Maternal Uncle   . Hypertension Maternal Grandmother   . COPD Maternal Grandmother   . Diabetes Maternal Grandfather   . Stroke Paternal Grandmother   . Diabetes Paternal Grandmother   . Diabetes Paternal  Grandfather   . Leukemia Maternal Uncle    Family Psychiatric  History: see H&P Social History:  Social History   Substance and Sexual Activity  Alcohol Use Yes   Comment: occ     Social History   Substance and Sexual Activity  Drug Use No    Social History   Socioeconomic History  . Marital status: Single    Spouse name: Not on file  . Number of children: Not on file  . Years of education: Not on file  . Highest education level: Not on file  Occupational History  . Not on file  Social Needs  . Financial resource strain: Not on file  . Food insecurity:    Worry: Not on file    Inability: Not on file  . Transportation needs:    Medical: Not on file    Non-medical: Not on file  Tobacco Use  . Smoking status: Never Smoker  . Smokeless tobacco: Never Used  Substance and Sexual Activity  . Alcohol use: Yes    Comment: occ  . Drug use: No  . Sexual activity: Yes    Birth control/protection: Pill  Lifestyle  . Physical activity:    Days per week: Not on file    Minutes per session: Not on file  . Stress: Not on file  Relationships  . Social connections:    Talks on phone: Not on file    Gets together: Not on file    Attends religious service: Not on file    Active member of club or organization: Not on file    Attends meetings of clubs or organizations: Not on file    Relationship status: Not on file  Other Topics Concern  . Not on file  Social History Narrative  . Not on file   Additional Social History:                         Sleep: Fair  Appetite:  Good  Current Medications: Current Facility-Administered Medications  Medication Dose Route Frequency Provider Last Rate Last Dose  . acetaminophen (TYLENOL) tablet 650 mg  650 mg Oral Q6H PRN Truman HaywardStarkes, Takia S, FNP      . hydrOXYzine (ATARAX/VISTARIL) tablet 25 mg  25 mg Oral TID PRN Truman HaywardStarkes, Takia S, FNP      . LORazepam (ATIVAN) tablet 1 mg  1 mg Oral Q6H PRN Antonieta Pertlary, Greg Lawson, MD   1 mg at  02/14/18 [id number]  . magnesium hydroxide (MILK OF MAGNESIA) suspension 30 mL  30 mL Oral Daily PRN Truman HaywardStarkes, Takia S, FNP      . OLANZapine (ZYPREXA) tablet 7.5 mg  7.5 mg Oral QHS Antonieta Pertlary, Greg Lawson, MD   7.5 mg at 02/13/18 2123  . ziprasidone (GEODON) injection 20 mg  20 mg Intramuscular Q12H PRN Antonieta Pertlary, Greg Lawson, MD        Lab Results: No results found for this or any previous visit (from the past 48 hour(s)).  Blood Alcohol level:  Lab Results  Component Value Date   ETH <10 02/11/2018   ETH <5 03/29/2017    Metabolic Disorder Labs:  No results found for: HGBA1C, MPG No results found for: PROLACTIN Lab Results  Component Value Date   CHOL 148 11/02/2016   TRIG 77 11/02/2016   HDL 64 11/02/2016   CHOLHDL 2.3 11/02/2016   VLDL 15 11/02/2016   LDLCALC 69 11/02/2016   LDLCALC 94 08/02/2014    Physical Findings: AIMS: Facial and Oral Movements Muscles of Facial Expression: None, normal Lips and Perioral Area: None, normal Jaw: None, normal Tongue: None, normal,Extremity Movements Upper (arms, wrists, hands, fingers): None, normal Lower (legs, knees, ankles, toes): None, normal, Trunk Movements Neck, shoulders, hips: None, normal, Overall Severity Severity of abnormal movements (highest score from questions above): None, normal Incapacitation due to abnormal movements: None, normal Patient's awareness of abnormal movements (rate only patient's report): No Awareness, Dental Status Current problems with teeth and/or dentures?: No Does patient usually wear dentures?: No  CIWA:    COWS:     Musculoskeletal: Strength & Muscle Tone: within normal limits Gait & Station: normal Patient leans: N/A  Psychiatric Specialty Exam: Physical Exam  Nursing note and vitals reviewed. Constitutional: She appears well-developed and well-nourished. No distress.  Psychiatric: She has a normal mood and affect. Her behavior is normal.    Review of Systems  Constitutional: Negative.    Psychiatric/Behavioral: Positive for depression and substance abuse (prescribed stiumlants). Negative for hallucinations and suicidal ideas. The patient is not nervous/anxious and does not have insomnia.     Blood pressure 118/79, pulse 81, temperature 98.1 F (36.7 C), temperature source Oral, resp. rate 16, height 5\' 4"  (1.626 m), weight 63.5 kg (140 lb), SpO2 100 %.Body mass index is 24.03 kg/m.  General Appearance: Neat and Well Groomed  Eye Contact:  Good  Speech:  Clear and Coherent and Normal Rate  Volume:  Normal  Mood:  Euthymic  Affect:  Congruent and Full Range  Thought Process:  Coherent  Orientation:  Full (Time, Place, and Person)  Thought Content:  Logical  Suicidal Thoughts:  No  Homicidal Thoughts:  No  Memory:  Recent;   Good Remote;   Good  Judgement:  Fair  Insight:  Fair  Psychomotor Activity:  Normal  Concentration:  Concentration: Good and Attention Span: Good  Recall:  Good  Fund of Knowledge:  Good  Language:  Good  Akathisia:  No  Handed:   AIMS (if indicated):     Assets:  Communication Skills Desire for Improvement Social Support  ADL's:  Intact  Cognition:  WNL  Sleep:  Number of Hours: 6.25     Treatment Plan Summary: Daily contact with patient to assess and evaluate symptoms and progress in treatment and Medication management   -Continue inpatient hospitalization.   -Will continue today 02/14/18 plan as below except where it is noted.   -Bipolar mania, substance induced with Psychosis            - Continue Zyprexa 7.5 mg HS  -Anxiety /agitation                      -continue Atarax 25 mg po q6h prn anxiety   -Continue ativan 1mg  po q6h prn agitation  -Insomnia              -Can use Atarax 25 mg PRN.   - Will continue to monitor vitals ,medication compliance and treatment side effects while patient is here.   - Reviewed labs  -Encourage participation in groups and therapeutic milieu   -Disposition planning will be ongoing  Mariel Craft, MD 02/14/2018, 4:01 PM

## 2018-02-14 NOTE — Progress Notes (Signed)
Patient approached RN with conflict with another patient in the cafeteria. Patient told Clinical research associatewriter what happened, and Clinical research associatewriter reassured patient she is safe on the unit. Patient was explained that the other patient is on 1:1 and we agreed to keep them separate. Patient requested a sitter during nighttime hours, and was told RN needs to check with charge nurse. Claimed she doesn't feel safe alone in her room. Offered drink, support, and encouragement.

## 2018-02-14 NOTE — [facility] Group Notes (Signed)
[facility] Group Notes:  (Nursing/MHT/Case Management/Adjunct)  Date:  02/14/2018  Time:  10:10 AM  Type of Therapy:  Goals and Orientation Group   Participation Level:  Minimal  Participation Quality:  Appropriate  Affect:  Flat  Cognitive:  Appropriate  Insight:  Improving  Engagement in Group:  Improving  Modes of Intervention:  Discussion and Orientation  Summary of Progress/Problems: Her goal for today is to get out of her room more and become involved in the groups and activities that take place on the unit.   Jomari Bartnik J Roshawna Colclasure 02/14/2018, 10:10 AM

## 2018-02-14 NOTE — [facility] Group Notes (Signed)
Adult Psychoeducational Group Note  Date:  02/14/2018  Time: 4:00 PM  Group Topic/Focus: Music as a Associate ProfessorCoping Skill  Participation Level:  Active  Participation Quality:  Appropriate and Attentive  Affect:  Appropriate  Cognitive:  Alert and Oriented  Insight: Improving  Engagement in Group:  Developing/Improving  Modes of Intervention:  Discussion and Education  Additional Comments:  Patient attended group and was interactive with peers.  Marchelle Folksmanda A Keari Miu 02/14/2018 5:00 PM

## 2018-02-14 NOTE — Progress Notes (Signed)
Recreation Therapy Notes  Date: 7.5.19 Time: 1000 Location: 500 Hall Dayroom  Group Topic: Stress Management  Goal Area(s) Addresses:  Patient will verbalize importance of using healthy stress management.  Patient will identify positive emotions associated with healthy stress management.   Behavioral Response: Engaged  Intervention: Stress Management  Activity : Coloring.  LRT introduced stress management to group.  Patients were given mandala coloring pages.  Patients were to color the drawings as soft music played in the background.  Education:  Stress Management, Discharge Planning.   Education Outcome: Acknowledges edcuation/In group clarification offered/Needs additional education  Clinical Observations/Feedback:  Pt stated yoga and meditation were examples of stress management.  Pt also stated she likes swimming and going to the gym as ways to relax.  At one point in the group, pt stated the activity was making her sleepy.  Pt was pleasant and engaged throughout group.     [name], [name]      Lillia AbedLindsay, Vitalia Stough A 02/14/2018 11:14 AM

## 2018-02-15 NOTE — Progress Notes (Signed)
D: Patient approached RN and requested medication for anxiety. She was mildly tremulous. She refused vistaril stating "I am allergic to that."  A: Administered Ativan as ordered. R: Patient contracts for safety.

## 2018-02-15 NOTE — [facility] Group Notes (Signed)
[facility] Group Notes:  (Nursing/MHT/Case Management/Adjunct)  Date:  02/15/2018  Time:  9:01 AM  Type of Therapy:  Orientation and Goals group  Participation Level:  Active  Participation Quality:  Appropriate  Affect:  Appropriate  Cognitive:  Appropriate  Insight:  Good  Engagement in Group:  Improving  Modes of Intervention:  Clarification, Discussion and Orientation  Summary of Progress/Problems: Her goal today is to work on her discharge plan with her doctor and to decrease trimmers by taking deep breaths.   Shuan Statzer J Dinah Lupa 02/15/2018, 9:01 AM

## 2018-02-15 NOTE — Progress Notes (Signed)
Patient ID: [name], female   DOB: May 07, 1996, 22 y.o.   MRN: [id number] DAR Note: Pt observed in the dayroom interacting with peer. Pt at the time of assessment appeared to be anxious and restless. Pt endorsed moderate anxiety and paranoia; "I want someone to stay with me while I use the bathroom." Pt at the time denied form of pain, SI, HI, AVH or depression; "I am ready to go home, my sister and mom were here today and that made my day." Medications offered as prescribed. All patient's questions and concerns addressed. Support, encouragement, and safe environment provide. Pt was med compliant. Safety checks continue.

## 2018-02-15 NOTE — BHH Group Notes (Signed)
Adult Psychoeducational Group Note  Date:  02/15/2018 Time:  8:49 PM  Group Topic/Focus:  Wrap-Up Group:   The focus of this group is to help patients review their daily goal of treatment and discuss progress on daily workbooks.  Participation Level:  Active  Participation Quality:  Appropriate  Affect:  Appropriate  Cognitive:  Appropriate  Insight: Appropriate  Engagement in Group:  Engaged  Modes of Intervention:  Discussion  Additional Comments:  Pt shared that her goal was to work on her responses to other people anger and work on her anxiety. Pt shared that she had a good day engaging with peers.   Lyndee HensenGoins, Zsazsa Bahena R 02/15/2018, 8:49 PM

## 2018-02-15 NOTE — Progress Notes (Signed)
D: Patient presents paranoid, childlike. She approached nurse stating "I need my water refilled." The water pitcher was full with fresh water and ice. Returned it to patient, stating that the water is safe to drink. She came up again and asked for a new straw. We gave her a new straw to reduce her anxiety. She is easily redirected, but has childlike responses. She reports sleeping well last night, and that her sleep medication was helpful. Her appetite is good, energy low, and concentration good. She rates her depression 2/10, feeling of hopelessness 4/10 and anxiety 3/10. She denies SI/HI/AVH. She reports tremors from medication, but none were observed or palpated.  A: Patient checked q15 min, and checks reviewed. Reviewed medication changes with patient and educated on side effects. Educated patient on importance of attending group therapy sessions and educated on several coping skills. Encouarged participation in milieu through recreation therapy and attending meals with peers. Support and encouragement provided. R: Patient receptive to education on medications, and is medication compliant. Patient contracts for safety on the unit.

## 2018-02-15 NOTE — [facility] Group Notes (Signed)
[facility]  Date/Time:  02/15/2018  11:00AM-12:00PM  Type of Therapy and Topic:  Group Therapy:  Music and Mood  Participation Level:  Active   Description of Group: In this process group, members listened to a variety of genres of music and identified that different types of music evoke different responses.  Patients were encouraged to identify music that was soothing for them and music that was energizing for them.  Patients discussed how this knowledge can help with wellness and recovery in various ways including managing depression and anxiety as well as encouraging healthy sleep habits.    Therapeutic Goals: 1. Patients will explore the impact of different varieties of music on mood 2. Patients will verbalize the thoughts they have when listening to different types of music 3. Patients will identify music that is soothing to them as well as music that is energizing to them 4. Patients will discuss how to use this knowledge to assist in maintaining wellness and recovery 5. Patients will explore the use of music as a coping skill  Summary of Patient Progress:  At the beginning of group, patient expressed that she felt good but tired from eating lunch, and at the end of group after participating heavily, she said she felt "hopeful."  Therapeutic Modalities: Solution Focused Brief Therapy Activity   Ambrose MantleMareida Grossman-Orr, LCSW

## 2018-02-15 NOTE — Progress Notes (Signed)
[facility]  02/15/2018 11:57 AM [name]  MRN:  [id number]   Subjective:  "I am feeling a lot better today. The medicines are helping me. I'm no longer depressed or anxious. I'm sleeping well. I have been going to groups & all the activities available so far".   Patient is a 22 year old female with a past psychiatric history significant for bipolar disorder who [facility] on [facility]/3 with psychotic symptoms.  The patient stated that she felt as though she was having involuntary tremors believed to be a parkinsonian-like syndrome.  The patient's mother was with the patient and gave permission for her to stay during the assessment.  The patient was having involuntary tremors.  The patient been previously hospitalized here a year ago and diagnosed with bipolar disorder and was discharged on Abilify.  She stated that she had been "misdiagnosed with bipolar disorder" and saw a local psychiatrist ( Dr [name]) who prescribed her stimulants and Xanax.  The patient stated that she was taking these medicines.  She stated that 1 of the reasons she came to the emergency room was that she believed her roommate had poisoned her recently.  She was unclear as to why she had poison her.  She thought that her tremors were secondary to withdrawal.  That she does not believe that she has bipolar disorder.  On admission she was paranoid, agitated and having delusional symptoms.  She was admitted to the hospital for evaluation and stabilization.  [name] (goes by [name]) is seen, chart reviewed. The chart findings discussed with the treatment team. She presents alert, oriented x 4. She is aware of situation, visible on the unit. She attending group sessions. Patient reports that she is doing well on all her medications. Denies any adverse effects or reactions. Reports a good appetite and states she is resting well. She denies any SI/HI, AVH, delusional thoughts or  paranoia, and does not appear to be responding to any internal stimuli. Patient has agreed to continue her current plan of care already in progress. She denies any other issues or concerns. Support encouragement reassurance was provided.  Principal Problem: Bipolar disorder, current episode manic severe with psychotic features [facility](HCC)  Diagnosis:   Patient Active Problem List   Diagnosis Date Noted  . Bipolar disorder, current episode manic severe with psychotic features (HCC) [F31.2]   . MDD (major depressive disorder), recurrent severe, without psychosis (HCC) [F33.2] 02/11/2018  . Pelvic pain [R10.2] 01/31/2018  . Genital herpes simplex [A60.00] 10/24/2017  . Anxiety [F41.9] 08/30/2017  . Severe bipolar I disorder, current or most recent episode depressed (HCC) [F31.4] 03/30/2017  . Syncope [R55] 03/03/2017  . STD exposure [Z20.2] 05/13/2015  . Preventative health care [Z00.00] 03/16/2013  . Bipolar disorder (HCC) [F31.9] 12/30/2012  . ADD (attention deficit disorder) [F98.8] 04/24/2011   Total Time spent with patient: 15 minutes   Past Psychiatric History: Patient have had a previous [facility] when she was diagnosed with bipolar disorder.  As stated above she has seen a local psychiatrist and he stopped her Abilify, was taking stimulants and Xanax.  Past Medical History:  Past Medical History:  Diagnosis Date  . ADHD (attention deficit hyperactivity disorder)   . Allergy   . Anxiety   . Bipolar disorder (HCC)   . Eczema 12/11/13   History reviewed. No pertinent surgical history.  Family History:  Family History  Problem Relation Age of Onset  . Asthma  Mother   . Stroke Father   . Heart disease Father   . Asthma Father   . Hypertension Father        pulmonary hypertension  . Heart disease Maternal Uncle   . Hypertension Maternal Grandmother   . COPD Maternal Grandmother   . Diabetes Maternal Grandfather   . Stroke Paternal Grandmother    . Diabetes Paternal Grandmother   . Diabetes Paternal Grandfather   . Leukemia Maternal Uncle    Family Psychiatric  History: See H&P  Social History:  Social History   Substance and Sexual Activity  Alcohol Use Yes   Comment: occ     Social History   Substance and Sexual Activity  Drug Use No    Social History   Socioeconomic History  . Marital status: Single    Spouse name: Not on file  . Number of children: Not on file  . Years of education: Not on file  . Highest education level: Not on file  Occupational History  . Not on file  Social Needs  . Financial resource strain: Not on file  . Food insecurity:    Worry: Not on file    Inability: Not on file  . Transportation needs:    Medical: Not on file    Non-medical: Not on file  Tobacco Use  . Smoking status: Never Smoker  . Smokeless tobacco: Never Used  Substance and Sexual Activity  . Alcohol use: Yes    Comment: occ  . Drug use: No  . Sexual activity: Yes    Birth control/protection: Pill  Lifestyle  . Physical activity:    Days per week: Not on file    Minutes per session: Not on file  . Stress: Not on file  Relationships  . Social connections:    Talks on phone: Not on file    Gets together: Not on file    Attends religious service: Not on file    Active member of club or organization: Not on file    Attends meetings of clubs or organizations: Not on file    Relationship status: Not on file  Other Topics Concern  . Not on file  Social History Narrative  . Not on file   Additional Social History:   Sleep: Fair  Appetite:  Good  Current Medications: Current Facility-Administered Medications  Medication Dose Route Frequency Provider Last Rate Last Dose  . acetaminophen (TYLENOL) tablet 650 mg  650 mg Oral Q6H PRN Truman Hayward, FNP      . hydrOXYzine (ATARAX/VISTARIL) tablet 25 mg  25 mg Oral TID PRN Truman Hayward, FNP   25 mg at 02/15/18 1027  . LORazepam (ATIVAN) tablet 1 mg  1  mg Oral Q6H PRN Antonieta Pert, MD   1 mg at 02/14/18 2536  . magnesium hydroxide (MILK OF MAGNESIA) suspension 30 mL  30 mL Oral Daily PRN Truman Hayward, FNP      . OLANZapine (ZYPREXA) tablet 7.5 mg  7.5 mg Oral QHS Antonieta Pert, MD   7.5 mg at 02/14/18 2121  . ziprasidone (GEODON) injection 20 mg  20 mg Intramuscular Q12H PRN Antonieta Pert, MD       Lab Results: No results found for this or any previous visit (from the past 48 hour(s)).  Blood Alcohol level:  Lab Results  Component Value Date   ETH <10 02/11/2018   ETH <5 03/29/2017   Metabolic Disorder Labs: No results found for: HGBA1C,  MPG No results found for: PROLACTIN Lab Results  Component Value Date   CHOL 148 11/02/2016   TRIG 77 11/02/2016   HDL 64 11/02/2016   CHOLHDL 2.3 11/02/2016   VLDL 15 11/02/2016   LDLCALC 69 11/02/2016   LDLCALC 94 08/02/2014   Physical Findings: AIMS: Facial and Oral Movements Muscles of Facial Expression: None, normal Lips and Perioral Area: None, normal Jaw: None, normal Tongue: None, normal,Extremity Movements Upper (arms, wrists, hands, fingers): None, normal Lower (legs, knees, ankles, toes): None, normal, Trunk Movements Neck, shoulders, hips: None, normal, Overall Severity Severity of abnormal movements (highest score from questions above): None, normal Incapacitation due to abnormal movements: None, normal Patient's awareness of abnormal movements (rate only patient's report): No Awareness, Dental Status Current problems with teeth and/or dentures?: No Does patient usually wear dentures?: No  CIWA:  CIWA-Ar Total: 0 COWS:  COWS Total Score: 1  Musculoskeletal: Strength & Muscle Tone: within normal limits Gait & Station: normal Patient leans: N/A  Psychiatric Specialty Exam: Physical Exam  Nursing note and vitals reviewed. Constitutional: She appears well-developed and well-nourished. No distress.  Psychiatric: She has a normal mood and affect. Her  behavior is normal.    Review of Systems  Constitutional: Negative.   Psychiatric/Behavioral: Positive for depression and substance abuse (prescribed stiumlants). Negative for hallucinations and suicidal ideas. The patient is not nervous/anxious and does not have insomnia.     Blood pressure 120/79, pulse (!) 105, temperature 98.3 F (36.8 C), temperature source Oral, resp. rate 18, height 5\' 4"  (1.626 m), weight 63.5 kg (140 lb), SpO2 100 %.Body mass index is 24.03 kg/m.  General Appearance: Neat and Well Groomed  Eye Contact:  Good  Speech:  Clear and Coherent and Normal Rate  Volume:  Normal  Mood:  Euthymic  Affect:  Congruent and Full Range  Thought Process:  Coherent  Orientation:  Full (Time, Place, and Person)  Thought Content:  Logical  Suicidal Thoughts:  No  Homicidal Thoughts:  No  Memory:  Recent;   Good Remote;   Good  Judgement:  Fair  Insight:  Fair  Psychomotor Activity:  Normal  Concentration:  Concentration: Good and Attention Span: Good  Recall:  Good  Fund of Knowledge:  Good  Language:  Good  Akathisia:  No  Handed:   AIMS (if indicated):     Assets:  Communication Skills Desire for Improvement Social Support  ADL's:  Intact  Cognition:  WNL  Sleep:  Number of Hours: 6.75   Treatment Plan Summary: Daily contact with patient to assess and evaluate symptoms and progress in treatment and Medication management   -Continue inpatient hospitalization.   -Will continue today 02/15/18 plan as below except where it is noted.   -Bipolar mania, substance induced with Psychosis            - Continue Zyprexa 7.5 mg po Q HS  -Anxiety/agitation                      -continue Atarax 25 mg po q6h prn anxiety   -Continue ativan 1mg  po q6h prn agitation  -Insomnia              -Can use Atarax 25 mg PRN.   - Will continue to monitor vitals ,medication compliance and treatment side effects while patient is here.   - Reviewed labs  -Encourage participation in  groups and therapeutic milieu   -Disposition planning will be ongoing   Armandina Stammer,  NP, PMHNP, FNP-BC 02/15/2018, 11:57 AMPatient ID: [name], female   DOB: Nov 20, 1995, 22 y.o.   MRN: [id number]

## 2018-02-15 NOTE — Plan of Care (Signed)
Her sleep is improving, but patient continues to remain paranoid. She antagonizes other patients, causing them to escalate. Patient set goal for today, "Working on my discharge plan with my doctor and decrease my tremors by taking deep breaths." She plans to carry this out by "focus on staying positive and relaxed."

## 2018-02-16 MED ORDER — OLANZAPINE 2.5 MG PO TABS
2.5000 mg | ORAL_TABLET | Freq: Once | ORAL | Status: DC
  Filled 2018-02-16: qty 1

## 2018-02-16 MED ORDER — OLANZAPINE 10 MG PO TABS
10.0000 mg | ORAL_TABLET | Freq: Every day | ORAL | Status: DC
  Administered 2018-02-16 – 2018-02-17 (×2): 10 mg via ORAL
  Filled 2018-02-16 (×4): qty 1

## 2018-02-16 NOTE — Progress Notes (Signed)
D: Patient presents guarded, paranoid, childlike. She demonstrates more concrete thinking about situations, as well as some bizarre incongruent responses. She approached RN "I am drinking coffee because it doesn't cause me anxiety." She then reports tremors come from soda and coffee "interacting with my meds." She came to [facility] to report that she was concerned that her roommate was trying to figure out her mom's phone number. Her sleep was good last night, and she reports the medication for sleep was helpful. Her appetite is good, energy normal, and concentration good. She rates her depression and sense of hopelessness 0/10. Her anxiety is 5/10. She denies SI.  A: Patient checked q15 min, and checks reviewed. Reviewed medication changes with patient and educated on side effects. Educated patient on importance of attending group therapy sessions and educated on several coping skills. Encouarged participation in milieu through recreation therapy and attending meals with peers. Provided support and encouragement. R: Patient receptive to education on medications, and is medication compliant. Patient contracts for safety on the unit.

## 2018-02-16 NOTE — B[facility] Group Notes (Signed)
  Description of Group:   [facility]  02/16/2018  1:50-2:45PM  Type of Therapy and Topic:  Group Therapy:  Coping with Emotions and exploring supports  Participation Level:  Active   Description of Group:  Patients in this group were introduced to the idea of adding a variety of healthy and unhealthy coping to address the various needs in their lives. Patients were asked to first identify how they feel about their hospitalization and what they would rather be doing, discussion was opened to explore how patients can maintain without coming back to the hospital. Patients were asked to name emotions and the unhealthy ways people react to those feelings. Discussion were had related to the three ways we tend to react to emotions (Escape, Explode and Express). Patients discussed what healthy coping skills and supports could be helpful in their recovery and wellness after discharge in order to prevent future hospitalizations. Patients were provided a list of possible supports they could add and encouraged to be open to adding new supports as well as removing the unhealthy supports in their life and unhealthy coping mechanisms. An emphasis was placed on following their discharge plan including therapy and taking their medications.   Therapeutic Goals: 1)  demonstrate the importance of healthy coping and supports  2)  provide education on sources of help and positive coping techniques  3)  identify the patient's current level of coping and current support  4)  elicit commitments to add one healthy support and one new coping skill   Summary of Patient Progress: Patient reported feeling anxious about being hospitalized. The patient expressed her plan is to work on her anxiety and stay out of the hospital by going to therapy and doctor appointments.   Therapeutic Modalities:   Motivational Interviewing Brief Solution-Focused Therapy  Shellia CleverlyStephanie N Shahd Occhipinti, [place]  02/16/2018 3:43 PM

## 2018-02-16 NOTE — BHH Group Notes (Signed)
B[facility] Group Notes:  (Nursing/MHT/Case Management/Adjunct)  Date:  02/16/2018  Time:  8:33 AM  Type of Therapy:  Orientation and Goals group  Participation Level:  Active  Participation Quality:  Appropriate  Affect:  Appropriate  Cognitive:  Appropriate  Insight:  Improving  Engagement in Group:  Engaged  Modes of Intervention:  Clarification, Discussion and Orientation  Summary of Progress/Problems: Her goal is to work on decreasing her anxiety.   Kadijah Shamoon J Melida Northington 02/16/2018, 8:33 AM

## 2018-02-16 NOTE — Progress Notes (Signed)
Patient ID: Jaclyn SladeShekinah Y [name], female   DOB: [birth year], 22 y.o.   MRN: [id number] DAR Note: Pt observed in the dayroom interacting with peer. Pt at the time of assessment appeared to be anxious and restless. Pt continue to endorse moderate anxiety and paranoia; "I just want to go home." Pt at the time of assessment denied form of pain, SI, HI, AVH or depression; "I am ready to go home, my sister and mom were here today and that made my day." Medications offered as prescribed. All patient's questions and concerns addressed. Support, encouragement, and safe environment provide. Pt was med compliant. Safety checks continue.

## 2018-02-16 NOTE — Plan of Care (Signed)
D: Pt denies SI/HI/AVH. Pt is pleasant and cooperative. Pt very paranoid about her roommate and could not sleep in the room with her, so pt was allowed to sleep in the quiet room. Pt presents very concrete , pt paranoid needing to see medication name before giving .   A: Pt was offered support and encouragement. Pt was given scheduled medications. Pt was encourage to attend groups. Q 15 minute checks were done for safety.   R:Pt attends groups and interacts well with peers and staff. Pt is taking medication. Pt has no complaints .Pt receptive to treatment and safety maintained on unit.   Problem: Activity: Goal: Interest or engagement in activities will improve Outcome: Progressing   Problem: Activity: Goal: Sleeping patterns will improve Outcome: Progressing   Problem: Safety: Goal: Periods of time without injury will increase Outcome: Progressing

## 2018-02-16 NOTE — Progress Notes (Signed)
Adult Psychoeducational Group Note  Date:  02/16/2018 Time:  9:07 PM  Group Topic/Focus:  Wrap-Up Group:   The focus of this group is to help patients review their daily goal of treatment and discuss progress on daily workbooks.  Participation Level:  Active  Participation Quality:  Appropriate  Affect:  Appropriate  Cognitive:  Appropriate  Insight: Appropriate  Engagement in Group:  Engaged  Modes of Intervention:  Discussion  Additional Comments:  Patient attended group and said that her day was a 9. Her coping skills for today were deep breathing and watching tv.   Alyvia Derk W Dishon Kehoe 02/16/2018, 9:07 PM

## 2018-02-16 NOTE — Plan of Care (Signed)
Patient reports a good mood, no Si, and sleeping well. Her thought process is still altered and she continues to display incongruency and paranoia. She was drinking coffee at nurse's station and reported on self inventory that her goal is to "not drink coffee or soda to reduce tremors" and "decrease my anxiety."

## 2018-02-16 NOTE — Progress Notes (Addendum)
[facility]  02/16/2018 4:54 PM Trula SladeShekinah Y Sandstrom  MRN:  [id number]   Subjective: [name] tearfully reports, "Now, I will not be able to be discharged from this place. The doctor has involuntarily committed me. The reason is because I informed him that every time I tried to talk on the phone, there is another female patient that walks by & tries to listen to my conversation. Another female patient always tries to mess with me to start some kind of argument, I try to avoid her. Now, why is that my fault? My room-mate came in last night, she was undressing in front me, butt naked like I was not there. That made me uncomfortable. I tried to report it to the staff, everyone thinks I am being paranoid".  Patient is a 22 year old female with a past psychiatric history significant for bipolar disorder who presented to the [facility] regional emergency department on 7/3 with psychotic symptoms.  The patient stated that she felt as though she was having involuntary tremors believed to be a parkinsonian-like syndrome.  The patient's mother was with the patient and gave permission for her to stay during the assessment.  The patient was having involuntary tremors.  The patient been previously hospitalized here a year ago and diagnosed with bipolar disorder and was discharged on Abilify.  She stated that she had been "misdiagnosed with bipolar disorder" and saw a local psychiatrist ( Dr Evelene CroonKaur) who prescribed her stimulants and Xanax.  The patient stated that she was taking these medicines.  She stated that 1 of the reasons she came to the emergency room was that she believed her roommate had poisoned her recently.  She was unclear as to why she had poison her.  She thought that her tremors were secondary to withdrawal.  That she does not believe that she has bipolar disorder.  On admission she was paranoid, agitated and having delusional symptoms.  She was admitted to the hospital for evaluation and  stabilization.  [name] (goes by [name]) is seen, chart reviewed. The chart findings discussed with the treatment team. She presents alert, oriented x 4. She is aware of situation, visible on the unit. She is attending group sessions. She is tearful today, crying & disappointed because her status has been changed to involuntary commitment by the doctor. She thought that this will make her discharge from this hospital impossible. (Please see Md's addendum on the reason for the involuntary commitment). Denies any adverse effects or reactions from her medications. Reports a good appetite and states she is resting well other than the change in her status. She denies any SI/HI, AVH, delusional thoughts and does not appear to be responding to any internal stimuli. Patient has agreed to continue her current plan of care already in progress. She denies any other issues or concerns. Support encouragement reassurance was provided.  Principal Problem: Bipolar disorder, current episode manic severe with psychotic features [facility](HCC)  Diagnosis:   Patient Active Problem List   Diagnosis Date Noted  . Bipolar disorder, current episode manic severe with psychotic features (HCC) [F31.2]   . MDD (major depressive disorder), recurrent severe, without psychosis (HCC) [F33.2] 02/11/2018  . Pelvic pain [R10.2] 01/31/2018  . Genital herpes simplex [A60.00] 10/24/2017  . Anxiety [F41.9] 08/30/2017  . Severe bipolar I disorder, current or most recent episode depressed (HCC) [F31.4] 03/30/2017  . Syncope [R55] 03/03/2017  . STD exposure [Z20.2] 05/13/2015  . Preventative health care [Z00.00] 03/16/2013  . Bipolar disorder (HCC) [F31.9]  12/30/2012  . ADD (attention deficit disorder) [F98.8] 04/24/2011   Total Time spent with patient: 15 minutes   Past Psychiatric History: Patient have had a previous [facility] when she was diagnosed with bipolar disorder.  As stated above she has seen a  local psychiatrist and he stopped her Abilify, was taking stimulants and Xanax.  Past Medical History:  Past Medical History:  Diagnosis Date  . ADHD (attention deficit hyperactivity disorder)   . Allergy   . Anxiety   . Bipolar disorder (HCC)   . Eczema 12/11/13   History reviewed. No pertinent surgical history.  Family History:  Family History  Problem Relation Age of Onset  . Asthma Mother   . Stroke Father   . Heart disease Father   . Asthma Father   . Hypertension Father        pulmonary hypertension  . Heart disease Maternal Uncle   . Hypertension Maternal Grandmother   . COPD Maternal Grandmother   . Diabetes Maternal Grandfather   . Stroke Paternal Grandmother   . Diabetes Paternal Grandmother   . Diabetes Paternal Grandfather   . Leukemia Maternal Uncle    Family Psychiatric  History: See H&P  Social History:  Social History   Substance and Sexual Activity  Alcohol Use Yes   Comment: occ     Social History   Substance and Sexual Activity  Drug Use No    Social History   Socioeconomic History  . Marital status: Single    Spouse name: Not on file  . Number of children: Not on file  . Years of education: Not on file  . Highest education level: Not on file  Occupational History  . Not on file  Social Needs  . Financial resource strain: Not on file  . Food insecurity:    Worry: Not on file    Inability: Not on file  . Transportation needs:    Medical: Not on file    Non-medical: Not on file  Tobacco Use  . Smoking status: Never Smoker  . Smokeless tobacco: Never Used  Substance and Sexual Activity  . Alcohol use: Yes    Comment: occ  . Drug use: No  . Sexual activity: Yes    Birth control/protection: Pill  Lifestyle  . Physical activity:    Days per week: Not on file    Minutes per session: Not on file  . Stress: Not on file  Relationships  . Social connections:    Talks on phone: Not on file    Gets together: Not on file    Attends  religious service: Not on file    Active member of club or organization: Not on file    Attends meetings of clubs or organizations: Not on file    Relationship status: Not on file  Other Topics Concern  . Not on file  Social History Narrative  . Not on file   Additional Social History:   Sleep: Fair  Appetite:  Good  Current Medications: Current Facility-Administered Medications  Medication Dose Route Frequency Provider Last Rate Last Dose  . acetaminophen (TYLENOL) tablet 650 mg  650 mg Oral Q6H PRN Truman Hayward, FNP      . hydrOXYzine (ATARAX/VISTARIL) tablet 25 mg  25 mg Oral TID PRN Truman Hayward, FNP   25 mg at 02/16/18 1533  . LORazepam (ATIVAN) tablet 1 mg  1 mg Oral Q6H PRN Antonieta Pert, MD   1 mg  at 02/15/18 1459  . magnesium hydroxide (MILK OF MAGNESIA) suspension 30 mL  30 mL Oral Daily PRN Truman Hayward, FNP      . OLANZapine (ZYPREXA) tablet 10 mg  10 mg Oral QHS Antonieta Pert, MD      . OLANZapine [name]) tablet 2.5 mg  2.5 mg Oral Once Antonieta Pert, MD      . ziprasidone (GEODON) injection 20 mg  20 mg Intramuscular Q12H PRN Antonieta Pert, MD       Lab Results: No results found for this or any previous visit (from the past 48 hour(s)).  Blood Alcohol level:  Lab Results  Component Value Date   ETH <10 02/11/2018   ETH <5 03/29/2017   Metabolic Disorder Labs: No results found for: HGBA1C, MPG No results found for: PROLACTIN Lab Results  Component Value Date   CHOL 148 11/02/2016   TRIG 77 11/02/2016   HDL 64 11/02/2016   CHOLHDL 2.3 11/02/2016   VLDL 15 11/02/2016   LDLCALC 69 11/02/2016   LDLCALC 94 08/02/2014   Physical Findings: AIMS: Facial and Oral Movements Muscles of Facial Expression: None, normal Lips and Perioral Area: None, normal Jaw: None, normal Tongue: None, normal,Extremity Movements Upper (arms, wrists, hands, fingers): None, normal Lower (legs, knees, ankles, toes): None, normal, Trunk  Movements Neck, shoulders, hips: None, normal, Overall Severity Severity of abnormal movements (highest score from questions above): None, normal Incapacitation due to abnormal movements: None, normal Patient's awareness of abnormal movements (rate only patient's report): No Awareness, Dental Status Current problems with teeth and/or dentures?: No Does patient usually wear dentures?: No  CIWA:  CIWA-Ar Total: 0 COWS:  COWS Total Score: 0  Musculoskeletal: Strength & Muscle Tone: within normal limits Gait & Station: normal Patient leans: N/A  Psychiatric Specialty Exam: Physical Exam  Nursing note and vitals reviewed. Constitutional: She appears well-developed and well-nourished. No distress.  Psychiatric: She has a normal mood and affect. Her behavior is normal.    Review of Systems  Constitutional: Negative.   Psychiatric/Behavioral: Positive for depression and substance abuse (prescribed stiumlants). Negative for hallucinations and suicidal ideas. The patient is not nervous/anxious and does not have insomnia.     Blood pressure 121/80, pulse (!) 103, temperature 98.3 F (36.8 C), temperature source Oral, resp. rate 16, height 5\' 4"  (1.626 m), weight 63.5 kg (140 lb), SpO2 100 %.Body mass index is 24.03 kg/m.  General Appearance: Neat and Well Groomed  Eye Contact:  Good  Speech:  Clear and Coherent and Normal Rate  Volume:  Normal  Mood:  Euthymic  Affect:  Congruent and Full Range  Thought Process:  Coherent  Orientation:  Full (Time, Place, and Person)  Thought Content:  Logical  Suicidal Thoughts:  No  Homicidal Thoughts:  No  Memory:  Recent;   Good Remote;   Good  Judgement:  Fair  Insight:  Fair  Psychomotor Activity:  Normal  Concentration:  Concentration: Good and Attention Span: Good  Recall:  Good  Fund of Knowledge:  Good  Language:  Good  Akathisia:  No  Handed:   AIMS (if indicated):     Assets:  Communication Skills Desire for Improvement Social  Support  ADL's:  Intact  Cognition:  WNL  Sleep:  Number of Hours: 6.5   Treatment Plan Summary: Daily contact with patient to assess and evaluate symptoms and progress in treatment and Medication management   -Continue inpatient hospitalization.   -Will continue today 02/16/18 plan  as below except where it is noted.   -Bipolar mania, substance induced with Psychosis            - Continue Zyprexa 7.5 mg po Q HS  -Anxiety/agitation                      -continue Atarax 25 mg po q6h prn anxiety   -Continue ativan 1mg  po q6h prn agitation  -Insomnia              -Can use Atarax 25 mg PRN.   - Will continue to monitor vitals ,medication compliance and treatment side effects while patient is here.   - Reviewed labs  -Encourage participation in groups and therapeutic milieu   -Disposition planning will be ongoing.  -Patient is involuntarily committed today by the Md due increased paranoia.   Armandina Stammer, NP, PMHNP, FNP-BC 02/16/2018, 4:54 PMPatient ID: [name], female   DOB: [birth year], 24 y.o.   MRN: [id number]   Attending addendum- Staff requested to see patient. Patient requesting discharge. Unfortunately IVC expired on 7/5. We requested she sign in on voluntary basis but patient declined. Examination revealed patient still with significant paranoia. Patient had been seen on 7/5 by me and had improved significantly with zyprexa. Will continue IVC, requested prn zyprexa 2.5 mg (whcih she refused). I have increased zyprexa to 10 mg po q hs.

## 2018-02-17 LAB — LIPID PANEL
CHOL/HDL RATIO: 2.9 ratio
CHOLESTEROL: 174 mg/dL (ref 0–200)
HDL: 60 mg/dL (ref >40)
LDL Cholesterol: 88 mg/dL (ref 0–99)
Triglycerides: 129 mg/dL (ref <150)
VLDL: 26 mg/dL (ref 0–40)

## 2018-02-17 LAB — HEMOGLOBIN A1C
Hgb A1c MFr Bld: 5 % (ref 4.8–5.6)
MEAN PLASMA GLUCOSE: 96.8 mg/dL

## 2018-02-17 MED ORDER — HYDROXYZINE HCL 25 MG PO TABS
25.0000 mg | ORAL_TABLET | Freq: Three times a day (TID) | ORAL | Status: DC | PRN
  Administered 2018-02-17 – 2018-02-18 (×2): 25 mg via ORAL
  Filled 2018-02-17 (×2): qty 1

## 2018-02-17 MED ORDER — OLANZAPINE 2.5 MG PO TABS
2.5000 mg | ORAL_TABLET | Freq: Every day | ORAL | Status: AC
  Administered 2018-02-17: 2.5 mg via ORAL
  Filled 2018-02-17: qty 1

## 2018-02-17 MED ORDER — OLANZAPINE 5 MG PO TABS
5.0000 mg | ORAL_TABLET | Freq: Every day | ORAL | Status: DC
  Administered 2018-02-18: 5 mg via ORAL
  Filled 2018-02-17 (×3): qty 1

## 2018-02-17 NOTE — Progress Notes (Addendum)
Recreation Therapy Notes  Date: 7.8.19 Time: 1000 Location: 500 Hall Dayroom  Group Topic: Triggers, Decision Making  Goal Area(s) Addresses:  Patient will be able to identify triggers. Patient will identify benefit of facing triggers. Patient will identify benefit of using coping skills for triggers post d/c.   Behavioral Response: Engaged  Intervention:  Worksheet, pencils  Activity: Triggers.  Patients will identify at least three things that trigger them.  Patients will then identify how they can avoid or reduce exposure to triggers and what strategies they can use when they can't avoid the triggers.  Education: Scientist, physiologicalDecision Making, Discharge Planning    Education Outcome: Acknowledges education  Clinical Observations/Feedback: Pt stated her triggers were anxiety, hearing things and heart palpitations.  Pt identified doing deep breathing, using a stress ball and counting to 10 as ways to reduce/avoid exposure to triggers.  Pt stated she could face her triggers head on by "allowing the other person to be open, pray with them and do something fun together".    Caroll RancherMarjette Peniel Biel, LRT/CTRS      Caroll RancherLindsay, Jatia Musa A 02/17/2018 12:45 PM

## 2018-02-17 NOTE — [facility] Group Notes (Signed)
[facility] Group Notes:  (Nursing/MHT/Case Management/Adjunct)  Date:  02/17/2018  Time:  9:33 AM  Type of Therapy:  orientation and goals group  Participation Level:  Active  Participation Quality:  Appropriate  Affect:  Appropriate  Cognitive:  Alert and Appropriate  Insight:  Appropriate and Good  Engagement in Group:  Engaged  Modes of Intervention:  Clarification, Education and Orientation  Summary of Progress/Problems:Her goal for today is to be able to stop the shakes.   Latoyna Hird J Maki Hege 02/17/2018, 9:33 AM

## 2018-02-17 NOTE — Progress Notes (Signed)
[facility] MD Progress Note  02/17/2018 1:12 PM [name]  MRN:  [id number]   Subjective: [name] reports that she had issues last night with her room-mate which caused her heart to race.  She now has a new room-mate that she is comfortable with. She had some other issues with other peers, but has been able to work things out.   She states she is regulated on her medicine that is controlling her depression, anxiety and AVH.  She denies SI, HI, AVH. She reports that her last AH were 5 days ago.  She states she is eating and sleeping well.  She reports that yesterday she felt like she could have been drugged, but notes that everything was cleaned off, so there would be no evidence.  This correlates with prn medications being given.  Lives with mother, denies drug use.   History:  Patient is a 22 year old female with a past psychiatric history significant for bipolar disorder who presented to the [facility] regional emergency department on 7/3 with psychotic symptoms.  The patient stated that she felt as though she was having involuntary tremors believed to be a parkinsonian-like syndrome.  The patient's mother was with the patient and gave permission for her to stay during the assessment.  The patient was having involuntary tremors.  The patient been previously hospitalized here a year ago and diagnosed with bipolar disorder and was discharged on Abilify.  She stated that she had been "misdiagnosed with bipolar disorder" and saw a local psychiatrist ( Dr [name]) who prescribed her stimulants and Xanax.  The patient stated that she was taking these medicines.  She stated that 1 of the reasons she came to the emergency room was that she believed her roommate had poisoned her recently.  She was unclear as to why she had poison her.  She thought that her tremors were secondary to withdrawal.  That she does not believe that she has bipolar disorder.  On admission she was paranoid, agitated and having delusional  symptoms.  She was admitted to the hospital for evaluation and stabilization.  Jaclyn (goes by [name]) is seen, chart reviewed. The chart findings discussed with the treatment team. She presents alert, oriented x 4. She is aware of situation, visible on the unit. She is attending group sessions. She is changed back to voluntary status today. Denies any adverse effects or reactions from her medications. Reports a good appetite and states she is resting well other than the change in her status. She denies any SI/HI, AVH, delusional thoughts and does not appear to be responding to any internal stimuli. Patient has agreed to continue her current plan of care already in progress. She denies any other issues or concerns. Support encouragement reassurance was provided.  Principal Problem: Bipolar disorder, current episode manic severe with psychotic features [facility])  Diagnosis:   Patient Active Problem List   Diagnosis Date Noted  . Bipolar disorder, current episode manic severe with psychotic features (HCC) [F31.2]   . MDD (major depressive disorder), recurrent severe, without psychosis (HCC) [F33.2] 02/11/2018  . Pelvic pain [R10.2] 01/31/2018  . Genital herpes simplex [A60.00] 10/24/2017  . Anxiety [F41.9] 08/30/2017  . Severe bipolar I disorder, current or most recent episode depressed (HCC) [F31.4] 03/30/2017  . Syncope [R55] 03/03/2017  . STD exposure [Z20.2] 05/13/2015  . Preventative health care [Z00.00] 03/16/2013  . Bipolar disorder (HCC) [F31.9] 12/30/2012  . ADD (attention deficit disorder) [F98.8] 04/24/2011   Total Time spent with patient: 15 minutes  Past Psychiatric History: Patient have had a previous [facility] when she was diagnosed with bipolar disorder.  As stated above she has seen a local psychiatrist and he stopped her Abilify, was taking stimulants and Xanax.  Past Medical History:  Past Medical History:  Diagnosis Date  . ADHD  (attention deficit hyperactivity disorder)   . Allergy   . Anxiety   . Bipolar disorder (HCC)   . Eczema 12/11/13   History reviewed. No pertinent surgical history.  Family History:  Family History  Problem Relation Age of Onset  . Asthma Mother   . Stroke Father   . Heart disease Father   . Asthma Father   . Hypertension Father        pulmonary hypertension  . Heart disease Maternal Uncle   . Hypertension Maternal Grandmother   . COPD Maternal Grandmother   . Diabetes Maternal Grandfather   . Stroke Paternal Grandmother   . Diabetes Paternal Grandmother   . Diabetes Paternal Grandfather   . Leukemia Maternal Uncle    Family Psychiatric  History: See H&P  Social History:  Social History   Substance and Sexual Activity  Alcohol Use Yes   Comment: occ     Social History   Substance and Sexual Activity  Drug Use No    Social History   Socioeconomic History  . Marital status: Single    Spouse name: Not on file  . Number of children: Not on file  . Years of education: Not on file  . Highest education level: Not on file  Occupational History  . Not on file  Social Needs  . Financial resource strain: Not on file  . Food insecurity:    Worry: Not on file    Inability: Not on file  . Transportation needs:    Medical: Not on file    Non-medical: Not on file  Tobacco Use  . Smoking status: Never Smoker  . Smokeless tobacco: Never Used  Substance and Sexual Activity  . Alcohol use: Yes    Comment: occ  . Drug use: No  . Sexual activity: Yes    Birth control/protection: Pill  Lifestyle  . Physical activity:    Days per week: Not on file    Minutes per session: Not on file  . Stress: Not on file  Relationships  . Social connections:    Talks on phone: Not on file    Gets together: Not on file    Attends religious service: Not on file    Active member of club or organization: Not on file    Attends meetings of clubs or organizations: Not on file     Relationship status: Not on file  Other Topics Concern  . Not on file  Social History Narrative  . Not on file   Additional Social History:   Sleep: Fair  Appetite:  Good  Current Medications: Current Facility-Administered Medications  Medication Dose Route Frequency Provider Last Rate Last Dose  . acetaminophen (TYLENOL) tablet 650 mg  650 mg Oral Q6H PRN Truman Hayward, FNP      . hydrOXYzine (ATARAX/VISTARIL) tablet 25 mg  25 mg Oral TID PRN Truman Hayward, FNP   25 mg at 02/16/18 1533  . LORazepam (ATIVAN) tablet 1 mg  1 mg Oral Q6H PRN Antonieta Pert, MD   1 mg at 02/16/18 1944  . magnesium hydroxide (MILK OF MAGNESIA) suspension 30 mL  30 mL Oral Daily PRN Starkes,  Juel Burrowakia S, FNP      . OLANZapine (ZYPREXA) tablet 10 mg  10 mg Oral QHS Antonieta Pertlary, Greg Lawson, MD   10 mg at 02/16/18 2151  . OLANZapine (ZYPREXA) tablet 2.5 mg  2.5 mg Oral Once Antonieta Pertlary, Greg Lawson, MD      . ziprasidone (GEODON) injection 20 mg  20 mg Intramuscular Q12H PRN Antonieta Pertlary, Greg Lawson, MD       Lab Results: No results found for this or any previous visit (from the past 48 hour(s)).  Blood Alcohol level:  Lab Results  Component Value Date   ETH <10 02/11/2018   ETH <5 03/29/2017   Metabolic Disorder Labs: No results found for: HGBA1C, MPG No results found for: PROLACTIN Lab Results  Component Value Date   CHOL 148 11/02/2016   TRIG 77 11/02/2016   HDL 64 11/02/2016   CHOLHDL 2.3 11/02/2016   VLDL 15 11/02/2016   LDLCALC 69 11/02/2016   LDLCALC 94 08/02/2014   Physical Findings: AIMS: Facial and Oral Movements Muscles of Facial Expression: None, normal Lips and Perioral Area: None, normal Jaw: None, normal Tongue: None, normal,Extremity Movements Upper (arms, wrists, hands, fingers): None, normal Lower (legs, knees, ankles, toes): None, normal, Trunk Movements Neck, shoulders, hips: None, normal, Overall Severity Severity of abnormal movements (highest score from questions above): None,  normal Incapacitation due to abnormal movements: None, normal Patient's awareness of abnormal movements (rate only patient's report): No Awareness, Dental Status Current problems with teeth and/or dentures?: No Does patient usually wear dentures?: No  CIWA:  CIWA-Ar Total: 0 COWS:  COWS Total Score: 0  Musculoskeletal: Strength & Muscle Tone: within normal limits Gait & Station: normal Patient leans: N/A  Psychiatric Specialty Exam: Physical Exam  Nursing note and vitals reviewed. Constitutional: She appears well-developed and well-nourished. No distress.  Psychiatric: She has a normal mood and affect. Her behavior is normal.    Review of Systems  Constitutional: Negative.   Psychiatric/Behavioral: Positive for depression and substance abuse (prescribed stiumlants). Negative for hallucinations and suicidal ideas. The patient is not nervous/anxious and does not have insomnia.     Blood pressure 131/90, pulse 96, temperature 98.2 F (36.8 C), temperature source Oral, resp. rate 16, height 5\' 4"  (1.626 m), weight 63.5 kg (140 lb), SpO2 100 %.Body mass index is 24.03 kg/m.  General Appearance: Neat and Well Groomed  Eye Contact:  Good  Speech:  Clear and Coherent and Normal Rate  Volume:  Normal  Mood:  Euthymic  Affect:  Congruent and Full Range  Thought Process:  Coherent  Orientation:  Full (Time, Place, and Person)  Thought Content:  Logical  Suicidal Thoughts:  No  Homicidal Thoughts:  No  Memory:  Recent;   Good Remote;   Good  Judgement:  Fair  Insight:  Fair  Psychomotor Activity:  Normal  Concentration:  Concentration: Good and Attention Span: Good  Recall:  Good  Fund of Knowledge:  Good  Language:  Good  Akathisia:  No  Handed:   AIMS (if indicated):     Assets:  Communication Skills Desire for Improvement Social Support  ADL's:  Intact  Cognition:  WNL  Sleep:  Number of Hours: 6   Treatment Plan Summary: Daily contact with patient to assess and  evaluate symptoms and progress in treatment and Medication management   -Continue inpatient hospitalization.   -Will continue today 02/17/18 plan as below except where it is noted.   -Bipolar mania, substance induced with Psychosis            -  Continue Zyprexa 10 mg po Q HS and start Zyprexa 5 mg QAM  -Anxiety/agitation                      -continue Atarax 25 mg po q6h prn anxiety   -Continue ativan 1mg  po q6h prn agitation  -Insomnia              -Can use Atarax 25 mg PRN.   - Will continue to monitor vitals ,medication compliance and treatment side effects while patient is here.   - Reviewed labs  -Encourage participation in groups and therapeutic milieu   -Disposition planning will be ongoing.    Mariel Craft, MD 02/17/2018, 1:12 PMPatient ID: [name], female   DOB: 09/29/1995, 21 y.o.   MRN: [id number]

## 2018-02-17 NOTE — [facility] Suicide Risk Assessment (Signed)
[facility] INPATIENT:  Family/Significant Other Suicide Prevention Education  Suicide Prevention Education:  Education Completed; No one  has been identified by the patient as the family member/significant other with whom the patient will be residing, and identified as the person(s) who will aid the patient in the event of a mental health crisis (suicidal ideations/suicide attempt).  With written consent from the patient, the family member/significant other has been provided the following suicide prevention education, prior to the and/or following the discharge of the patient.  The suicide prevention education provided includes the following:  Suicide risk factors  Suicide prevention and interventions  National Suicide Hotline telephone number  [facility] assessment telephone number  [facility] Emergency Assistance 911  [facility] and/or Residential Mobile Crisis Unit telephone number  Request made of family/significant other to:  Remove weapons (e.g., guns, rifles, knives), all items previously/currently identified as safety concern.    Remove drugs/medications (over-the-counter, prescriptions, illicit drugs), all items previously/currently identified as a safety concern.  The family member/significant other verbalizes understanding of the suicide prevention education information provided.  The family member/significant other agrees to remove the items of safety concern listed above. The patient did not endorse SI at the time of admission, nor did the patient c/o SI during the stay here.  SPE not required.  However, I did talk to mother, Ms Aundria RudRogers, [phone] and we went over treatment team recommendations and crises plan.  thjere are no guns in the home.  Ida RogueRodney B Jeniffer Culliver 02/17/2018, 12:46 PM

## 2018-02-17 NOTE — Plan of Care (Signed)
  Problem: Activity: Goal: Interest or engagement in activities will improve Outcome: Progressing   Problem: Safety: Goal: Periods of time without injury will increase Outcome: Progressing   Problem: Safety: Goal: Periods of time without injury will increase Outcome: Progressing   Problem: Health Behavior/Discharge Planning: Goal: Compliance with prescribed medication regimen will improve Outcome: Progressing  DAR NOTE: Patient presents with anxious affect and mood.  Denies suicidal thoughts, pain, auditory and visual hallucinations.  Rates depression at 0, hopelessness at 0, and anxiety at 3.  Maintained on routine safety checks.  Medications given as prescribed.  Support and encouragement offered as needed.  Attended group and participated.  States goal for today is "stop tremors."  Patient observed socializing with peers in the dayroom.  Offered no complaint.

## 2018-02-18 ENCOUNTER — Other Ambulatory Visit: Payer: Self-pay

## 2018-02-18 ENCOUNTER — Encounter (HOSPITAL_BASED_OUTPATIENT_CLINIC_OR_DEPARTMENT_OTHER): Payer: Self-pay

## 2018-02-18 ENCOUNTER — Emergency Department (HOSPITAL_BASED_OUTPATIENT_CLINIC_OR_DEPARTMENT_OTHER)
Admission: EM | Admit: 2018-02-18 | Discharge: 2018-02-18 | Disposition: A | Discharge status: Home / Self Care | Payer: BC Managed Care – PPO | Attending: Emergency Medicine | Admitting: Emergency Medicine

## 2018-02-18 DIAGNOSIS — M791 Myalgia, unspecified site: Secondary | ICD-10-CM | POA: Diagnosis not present

## 2018-02-18 DIAGNOSIS — F419 Anxiety disorder, unspecified: Secondary | ICD-10-CM

## 2018-02-18 LAB — COMPREHENSIVE METABOLIC PANEL
ALK PHOS: 48 U/L (ref 38–126)
ALT: 17 U/L (ref 0–44)
ANION GAP: 8 (ref 5–15)
AST: 18 U/L (ref 15–41)
Albumin: 3.7 g/dL (ref 3.5–5.0)
BUN: 16 mg/dL (ref 6–20)
CALCIUM: 8.7 mg/dL — AB (ref 8.9–10.3)
CO2: 27 mmol/L (ref 22–32)
Chloride: 105 mmol/L (ref 98–111)
Creatinine, Ser: 0.73 mg/dL (ref 0.44–1.00)
GFR calc non Af Amer: >60 mL/min (ref >60)
Glucose, Bld: 90 mg/dL (ref 70–99)
Potassium: 4.2 mmol/L (ref 3.5–5.1)
SODIUM: 140 mmol/L (ref 135–145)
TOTAL PROTEIN: 6.8 g/dL (ref 6.5–8.1)
Total Bilirubin: 0.4 mg/dL (ref 0.3–1.2)

## 2018-02-18 LAB — CBC WITH DIFFERENTIAL/PLATELET
Basophils Absolute: 0 10*3/uL (ref 0.0–0.1)
Basophils Relative: 0 %
EOS ABS: 0.3 10*3/uL (ref 0.0–0.7)
EOS PCT: 4 %
HCT: 35.6 % — ABNORMAL LOW (ref 36.0–46.0)
HEMOGLOBIN: 12.5 g/dL (ref 12.0–15.0)
LYMPHS ABS: 3.8 10*3/uL (ref 0.7–4.0)
Lymphocytes Relative: 47 %
MCH: 30.9 pg (ref 26.0–34.0)
MCHC: 35.1 g/dL (ref 30.0–36.0)
MCV: 87.9 fL (ref 78.0–100.0)
MONO ABS: 0.8 10*3/uL (ref 0.1–1.0)
MONOS PCT: 10 %
Neutro Abs: 3.2 10*3/uL (ref 1.7–7.7)
Neutrophils Relative %: 39 %
PLATELETS: 321 10*3/uL (ref 150–400)
RBC: 4.05 MIL/uL (ref 3.87–5.11)
RDW: 13.1 % (ref 11.5–15.5)
WBC: 8.2 10*3/uL (ref 4.0–10.5)

## 2018-02-18 LAB — URINALYSIS, ROUTINE W REFLEX MICROSCOPIC
Bilirubin Urine: NEGATIVE
Glucose, UA: NEGATIVE mg/dL
Hgb urine dipstick: NEGATIVE
KETONES UR: NEGATIVE mg/dL
LEUKOCYTES UA: NEGATIVE
Nitrite: NEGATIVE
PH: 6.5 (ref 5.0–8.0)
PROTEIN: NEGATIVE mg/dL
Specific Gravity, Urine: 1.01 (ref 1.005–1.030)

## 2018-02-18 LAB — PREGNANCY, URINE: Preg Test, Ur: NEGATIVE

## 2018-02-18 LAB — CK: CK TOTAL: 86 U/L (ref 38–234)

## 2018-02-18 LAB — MAGNESIUM: MAGNESIUM: 2 mg/dL (ref 1.7–2.4)

## 2018-02-18 MED ORDER — HYDROXYZINE HCL 25 MG PO TABS: 25.0000 mg | ORAL_TABLET | Freq: Three times a day (TID) | ORAL | 0 refills | Status: DC | PRN

## 2018-02-18 MED ORDER — OLANZAPINE 10 MG PO TABS: 10.0000 mg | ORAL_TABLET | Freq: Every day | ORAL | 0 refills | Status: DC

## 2018-02-18 MED ORDER — LORAZEPAM 2 MG/ML IJ SOLN
1.0000 mg | Freq: Once | INTRAMUSCULAR | Status: AC
  Administered 2018-02-18: 1 mg via INTRAVENOUS
  Filled 2018-02-18: qty 1

## 2018-02-18 MED ORDER — LORAZEPAM 1 MG PO TABS: 1.0000 mg | ORAL_TABLET | Freq: Three times a day (TID) | ORAL | 0 refills | Status: DC | PRN

## 2018-02-18 MED ORDER — LORAZEPAM 2 MG/ML IJ SOLN
0.5000 mg | Freq: Once | INTRAMUSCULAR | Status: AC
  Administered 2018-02-18: 0.5 mg via INTRAVENOUS
  Filled 2018-02-18: qty 1

## 2018-02-18 MED ORDER — OLANZAPINE 5 MG PO TABS: 5.0000 mg | ORAL_TABLET | Freq: Every day | ORAL | 0 refills | Status: DC

## 2018-02-18 MED ORDER — SODIUM CHLORIDE 0.9 % IV BOLUS
1000.0000 mL | Freq: Once | INTRAVENOUS | Status: AC
  Administered 2018-02-18: 1000 mL via INTRAVENOUS

## 2018-02-18 NOTE — ED Triage Notes (Addendum)
Pt c/o pain to "my thighs, my arms and my mouth"-states pain started just PTA-mother states she [facility] 330p [facility]-concerns c/o may be r/t to multiple med changes [facility]-pt NAD/tearful-steady gait

## 2018-02-18 NOTE — Discharge Instructions (Signed)
Follow-up with your therapist and psychiatrist as previously scheduled.  Take Ativan as needed for anxiety and muscle spasms.

## 2018-02-18 NOTE — [facility] Suicide Risk Assessment (Signed)
[facility] Discharge Suicide Risk Assessment   Principal Problem: Bipolar disorder, current episode manic severe with psychotic features [facility]) Discharge Diagnoses:  Patient Active Problem List   Diagnosis Date Noted  . Bipolar disorder, current episode manic severe with psychotic features (HCC) [F31.2]   . MDD (major depressive disorder), recurrent severe, without psychosis (HCC) [F33.2] 02/11/2018  . Pelvic pain [R10.2] 01/31/2018  . Genital herpes simplex [A60.00] 10/24/2017  . Anxiety [F41.9] 08/30/2017  . Severe bipolar I disorder, current or most recent episode depressed (HCC) [F31.4] 03/30/2017  . Syncope [R55] 03/03/2017  . STD exposure [Z20.2] 05/13/2015  . Preventative health care [Z00.00] 03/16/2013  . Bipolar disorder (HCC) [F31.9] 12/30/2012  . ADD (attention deficit disorder) [F98.8] 04/24/2011    Total Time spent with patient: 45 minutes History of Present Illness: Patient is seen and examined.  Patient is a 22 year old female with a past psychiatric history significant for bipolar disorder who presented to the [facility] emergency department on 7/3 with psychotic symptoms.  The patient stated that she felt as though she was having involuntary tremors believed to be a parkinsonian-like syndrome.  The patient's mother was with the patient and gave permission for her to stay during the assessment.  The patient was having involuntary tremors.  The patient been previously hospitalized here a year ago and diagnosed with bipolar disorder and was discharged on Abilify.  She stated that she had been "misdiagnosed with bipolar disorder" and saw a local psychiatrist ( Dr [name]) who prescribed her stimulants and Xanax.  The patient stated that she was taking these medicines.  She stated that 1 of the reasons she came to the emergency room was that she believed her roommate had poisoned her recently.  She was unclear as to why she had poison her.  She thought that her tremors were secondary  to withdrawal.  That she does not believe that she has bipolar disorder.  On admission she was paranoid, agitated and having delusional symptoms.  She was admitted to the hospital for evaluation and stabilization. She was started on Risperdal.  She was given half a milligram during the day, and 1 mg p.o. nightly.  This morning she was having the involuntary jerking movements again.  I evaluated the patient, and did neurological maneuvers.  It was quite clear that this was volitional.  She is quite psychotic.  She is still very paranoid.  She was given Zyprexa 2.5 mg p.o. x1, and her lorazepam was increased to 1 mg p.o. every 6 hours as needed anxiety.  She was able to be calmed.   Annalisse (goes by [place]) is seen, chart reviewed. The chart findings discussed with the treatment team. She presents alert, oriented x 4. She reports a "better night with her new room-mate, and states she was able to rest and sleep."  She does not appear paranoid this morning, and reports that she thinks having the extra small dose of Zyprexa in the morning is helpful. She is looking forward to discharge today. Denies any adverse effects or reactions from her medications. She is visible on the unit. She is attending group sessions.  Reports a good appetite and states she is resting well other than the change in her status. She denies any SI/HI, AVH, delusional thoughts and does not appear paranoid or to be responding to any internal stimuli. Patienthas agreed to continue her current plan of care already in progress. She denies any other issues or concerns. Support,  encouragement and reassurance was provided.  She was able to engage in safety planning including [facility] or contact emergency services if she feels unable to maintain her own safety or the safety of others. Pt had no further questions, comments, or concerns.    Musculoskeletal: Strength & Muscle Tone: within normal limits Gait & Station:  normal Patient leans: N/A  Psychiatric Specialty Exam: Review of Systems  Constitutional: Negative.   Psychiatric/Behavioral: Negative for depression, hallucinations, substance abuse and suicidal ideas. The patient is nervous/anxious. The patient does not have insomnia.   All other systems reviewed and are negative. Nursing notes and VS reviewed  Blood pressure 128/87, pulse (!) 104, temperature 98.9 F (37.2 C), temperature source Oral, resp. rate 18, height 5\' 4"  (1.626 m), weight 63.5 kg (140 lb), SpO2 100 %.Body mass index is 24.03 kg/m.  General Appearance: Casual and Neat  Eye Contact::  Good  Speech:  Clear and Coherent and Normal Rate409  Volume:  Normal  Mood:  Anxious  Affect:  Appropriate  Thought Process:  Coherent, Goal Directed, Linear and Descriptions of Associations: Intact  Orientation:  Full (Time, Place, and Person)  Thought Content:  Logical and Hallucinations: None  Suicidal Thoughts:  No  Homicidal Thoughts:  No  Memory:  Recent;   Good Remote;   Good  Judgement:  Fair  Insight:  Fair  Psychomotor Activity:  Normal  Concentration:  Good  Recall:  Good  Fund of Knowledge:Good  Language: Good  Akathisia:  No  Handed:  Right  AIMS (if indicated):   0  Assets:  Communication Skills Desire for Improvement Social Support  Sleep:  Number of Hours: 5.5  Cognition: WNL  ADL's:  Intact   Mental Status Per Nursing Assessment::   On Admission:  NA  Demographic Factors:  Adolescent or young adult and Unemployed  Loss Factors: NA  Historical Factors: NA  Risk Reduction Factors:   Sense of responsibility to family, Religious beliefs about death, Living with another person, especially a relative and Positive therapeutic relationship  Continued Clinical Symptoms:  Bipolar Disorder:   Mixed State, resolved  Cognitive Features That Contribute To Risk:  None    Suicide Risk:  Minimal: No identifiable suicidal ideation.  Patients presenting with no  risk factors but with morbid ruminations; may be classified as minimal risk based on the severity of the depressive symptoms  Follow-up Information    Milagros EvenerKaur, Rupinder, MD Follow up on 03/15/2018.   Specialty:  Psychiatry Why:  Medication management with Dr. Evelene CroonKaur on Saturday, 03/15/18 at 5:30PM. You have been placed on the cancellation list if anything becomes available sooner.   Contact information: [address] [address] [place] [address] [phone]        [facility] Follow up on 02/19/2018.   Specialty:  Behavioral Health Why:  I could not get you an appointment with Aurther Lofterry until September.  They offered another therapist in [place] for as soon as next week but I did not make an appointment as I did not know what you would want to do.  Call them when you decide what you want to do.  Contact information: [address] [place] [id number] [phone]          Plan Of Care/Follow-up recommendations:  Activity:  as tolerated Diet:  as tolerated   Treatment Plan Summary: -Continue medications as per inpatient hospitalization.  -Bipolar mania, substance induced with Psychosis - Continue  Zyprexa 5 mg QAM and Zyprexa 10 mg po Q HS   -Anxiety/agitation -continue Atarax 25 mg po q6h prn anxiety            -Insomnia -Can use Atarax 25 mg PRN.   -Disposition:  02/18/2018 into care of family.  -She was able to engage in safety planning including plan to return to [facility] or contact emergency services if she feels unable to maintain her own safety or the safety of others. Pt had no further questions, comments, or concerns.    Mariel Craft, MD 02/18/2018, 12:27 PM

## 2018-02-18 NOTE — Progress Notes (Signed)
  [facility] Discharge Plan :  Will you be returning to the same living situation after discharge:  Yes,  home At discharge, do you have transportation home?: Yes,  mother Do you have the ability to pay for your medications: Yes,  insurance  Release of information consent forms completed and in the chart;  Patient's signature needed at discharge.  Patient to Follow up at: Follow-up Information    [name], Rupinder, MD Follow up on 03/15/2018.   Specialty:  Psychiatry Why:  Medication management with Dr. Evelene CroonKaur on Saturday, 03/15/18 at 5:30PM. You have been placed on the cancellation list if anything becomes available sooner.   Contact information: [address] [address] [place] [address] [phone]        [facility] Follow up on 02/19/2018.   Specialty:  Behavioral Health Why:  I could not get you an appointment with Aurther Lofterry until September.  They offered another therapist in [place] for as soon as next week but I did not make an appointment as I did not know what you would want to do.  Call them when you decide what you want to do.  Contact information: [address] [place] [id number] [phone]          Next level of care provider has access to [facility] Link:no  Safety Planning and Suicide Prevention discussed: Yes,  yes  Have you used any form of tobacco in the last 30 days? (Cigarettes, Smokeless Tobacco, Cigars, and/or Pipes): No  Has patient been referred to the Quitline?: N/A patient is not a smoker  Patient has been referred for addiction treatment: N/A  Ida RogueRodney B Eddy Liszewski, LCSW 02/18/2018, 10:23 AM

## 2018-02-18 NOTE — Progress Notes (Signed)
Recreation Therapy Notes  Date: 7.9.19 Time: 1000 Location: 500 Hall Dayroom  Group Topic: Leisure Education  Goal Area(s) Addresses:  Patient will identify positive leisure activities.  Patient will identify one positive benefit of participation in leisure activities.   Behavioral Response: Engaged  Intervention: 2 Small [employer]alls  Activity: Keep [employer]orGoing Volleyball.  Patients were seated in a circle.  LRT placed one ball in rotation.  Patients were instructed they had to keep the ball moving at all times.  LRT would keep count of the number of hits on the ball. If the ball stopped moving at any time, the count would start over.  Education:  Leisure Education, Building control surveyorDischarge Planning  Education Outcome: Acknowledges education/In group clarification offered/Needs additional education  Clinical Observations/Feedback: Pt was bright and smiling.  Pt was very active and engaged with her peers.  Pt stated the group had to use teamwork to complete the activity.    Caroll RancherMarjette Ernesteen Mihalic, LRT/CTRS      Caroll RancherLindsay, Braelen Sproule A 02/18/2018 12:29 PM

## 2018-02-18 NOTE — Progress Notes (Signed)
Patient ID: Jaclyn SladeShekinah Y Luzier, female   DOB: [birth year], 22 y.o.   MRN: [id number] Patient discharged to home/self care in the presence of her mother.  Patient denies SI, HI and AVH upon discharge and reports a decrease in anxiety.  Patient acknowledged understanding of all discharge instructions and receipt of all personal belongings.

## 2018-02-18 NOTE — Discharge Summary (Addendum)
Physician Discharge Summary Note  Patient:  [name] is an 22 y.o., female  MRN:  [id number]  DOB:  [birth year]  Patient phone:  [phone] (home)   Patient address:   [place] [address],   Total Time spent with patient: Greater than 30 minutes  Date of Admission:  02/11/2018 Date of Discharge: 02-18-18  Reason for Admission: Worsening psychotic symptoms.  Principal Problem: Bipolar disorder, current episode manic severe with psychotic features [facility])  Discharge Diagnoses: Patient Active Problem List   Diagnosis Date Noted  . Bipolar disorder, current episode manic severe with psychotic features ([employer]) [F31.2]   . MDD (major depressive disorder), recurrent severe, without psychosis ([place]) [F33.2] 02/11/2018  . Pelvic pain [R10.2] 01/31/2018  . Genital herpes simplex [A60.00] 10/24/2017  . Anxiety [F41.9] 08/30/2017  . Severe bipolar I disorder, current or most recent episode depressed ([place]) [F31.4] 03/30/2017  . Syncope [R55] 03/03/2017  . STD exposure [Z20.2] 05/13/2015  . Preventative health care [Z00.00] 03/16/2013  . Bipolar disorder ([place]) [F31.9] 12/30/2012  . ADD (attention deficit disorder) [F98.8] 04/24/2011   Past Psychiatric History: Bipolar disorder  Past Medical History:  Past Medical History:  Diagnosis Date  . ADHD (attention deficit hyperactivity disorder)   . Allergy   . Anxiety   . Bipolar disorder ([place])   . Eczema 12/11/13   History reviewed. No pertinent surgical history.  Family History:  Family History  Problem Relation Age of Onset  . Asthma Mother   . Stroke Father   . Heart disease Father   . Asthma Father   . Hypertension Father        pulmonary hypertension  . Heart disease Maternal Uncle   . Hypertension Maternal Grandmother   . COPD Maternal Grandmother   . Diabetes Maternal Grandfather   . Stroke Paternal Grandmother   . Diabetes Paternal Grandmother   . Diabetes Paternal Grandfather   .  Leukemia Maternal Uncle    Family Psychiatric  History: See H&P  Social History:  Social History   Substance and Sexual Activity  Alcohol Use Yes   Comment: occ     Social History   Substance and Sexual Activity  Drug Use No    Social History   Socioeconomic History  . Marital status: Single    Spouse name: Not on file  . Number of children: Not on file  . Years of education: Not on file  . Highest education level: Not on file  Occupational History  . Not on file  Social Needs  . Financial resource strain: Not on file  . Food insecurity:    Worry: Not on file    Inability: Not on file  . Transportation needs:    Medical: Not on file    Non-medical: Not on file  Tobacco Use  . Smoking status: Never Smoker  . Smokeless tobacco: Never Used  Substance and Sexual Activity  . Alcohol use: Yes    Comment: occ  . Drug use: No  . Sexual activity: Yes    Birth control/protection: Pill  Lifestyle  . Physical activity:    Days per week: Not on file    Minutes per session: Not on file  . Stress: Not on file  Relationships  . Social connections:    Talks on phone: Not on file    Gets together: Not on file    Attends religious service: Not on file    Active member of club or organization: Not  on file    Attends meetings of clubs or organizations: Not on file    Relationship status: Not on file  Other Topics Concern  . Not on file  Social History Narrative  . Not on file   Hospital Course: (Per Md's admission notes): Patient is a 22 year old female with a past psychiatric history significant for bipolar disorder who presented to the [facility] regional emergency department on 7/3 with psychotic symptoms.  The patient stated that she felt as though she was having involuntary tremors believed to be a parkinsonian-like syndrome.  The patient's mother was with the patient and gave permission for her to stay during the assessment.  The patient was having involuntary tremors.   The patient been previously hospitalized here a year ago and diagnosed with bipolar disorder and was discharged on Abilify.  She stated that she had been "misdiagnosed with bipolar disorder" and saw a local psychiatrist ( Dr Toy Care) who prescribed her stimulants and Xanax.  The patient stated that she was taking these medicines.  She stated that 1 of the reasons she came to the emergency room was that she believed her roommate had poisoned her recently.  She was unclear as to why she had poison her.  She thought that her tremors were secondary to withdrawal.  That she does not believe that she has bipolar disorder.  On admission she was paranoid, agitated and having delusional symptoms.  She was admitted to the hospital for evaluation and stabilization.  After the above admission notes, [name] was started on medication regimen for her presenting symptoms. She received & was discharged on Olanzapine 10 mg Q hs for mood control, Olanzapine 5 mg daily for mood control  Hydroxyzine 25 mg prn for anxiety. She was also enrolled & participated in the group counseling sessions being offered & held on this unit. She presented no other significant health issues that requires treatment & or monitoring. She tolerated her treatment regimen without any adverse effects or reactions reported.  At her discharge assessment/interview today with the attending psychiatrist, [name] reports feeling much better. She states that resuming & being on mental health medication has helped her a lot. She says she says she is no longer feeling paranoid. She says she feels she is back to her old self. No thoughts of suicide. . No thoughts of homicide. No thoughts of violence. No access to weapons. She reports that she is in good spirits. Not feeling depressed. Reports normal energy and interest. Has been maintaining normal biological functions. She is able to think clearly. She is able to focus on task. Her thoughts are not crowded or  racing. No evidence of mania. No hallucination in any modality. She is not making any delusional statement. No passivity of will/thought. She is fully in touch with reality. No overwhelming anxiety.  She has been in contact with her mom/family. She has their support. She is looking forward to getting discharged to be with family.   [name]'s case was discussed at the treatment team meeting today. The nursing staff notes that she has been bright today. She has not been observed to be internally disturbed. She has not voiced any suicidal thoughts today. Patient has been cooperative with staff and has tolerated her medications well.  Team members feel that patient is back to her baseline level of function. Team agrees with plan to discharge patient today to continue mental health care on an outpatient basis as noted below. She is provided with all pertinent information needed  to make this appointment without problems. She left [facility] with all personal belongings in no apparent distress. Transportation per family.   Physical Findings: AIMS: Facial and Oral Movements Muscles of Facial Expression: None, normal Lips and Perioral Area: None, normal Jaw: None, normal Tongue: None, normal,Extremity Movements Upper (arms, wrists, hands, fingers): None, normal Lower (legs, knees, ankles, toes): None, normal, Trunk Movements Neck, shoulders, hips: None, normal, Overall Severity Severity of abnormal movements (highest score from questions above): None, normal Incapacitation due to abnormal movements: None, normal Patient's awareness of abnormal movements (rate only patient's report): No Awareness, Dental Status Current problems with teeth and/or dentures?: No Does patient usually wear dentures?: No  CIWA:  CIWA-Ar Total: 0 COWS:  COWS Total Score: 0  Musculoskeletal: Strength & Muscle Tone: within normal limits Gait & Station: normal Patient leans: N/A  Psychiatric Specialty Exam: Physical Exam  Nursing  note and vitals reviewed. Constitutional: She appears well-developed.  HENT:  Head: Normocephalic.  Eyes: Pupils are equal, round, and reactive to light.  Neck: Normal range of motion.  Cardiovascular: Normal rate.  GI: Soft.  Genitourinary:  Genitourinary Comments: Deferred  Musculoskeletal: Normal range of motion.  Neurological: She is alert.  Skin: Skin is warm.    Review of Systems  Constitutional: Negative.   HENT: Negative.   Eyes: Negative.   Respiratory: Negative.   Cardiovascular: Negative.   Gastrointestinal: Negative.   Genitourinary: Negative.   Musculoskeletal: Negative.   Skin: Negative.   Neurological: Negative.   Endo/Heme/Allergies: Negative.   Psychiatric/Behavioral: Positive for depression (Stable) and substance abuse (Benzodiazepine use). Negative for hallucinations, memory loss and suicidal ideas. The patient has insomnia (Stable). The patient is not nervous/anxious.     Blood pressure 128/87, pulse (!) 104, temperature 98.9 F (37.2 C), temperature source Oral, resp. rate 18, height '5\' 4"'$  (1.626 m), weight 63.5 kg (140 lb), SpO2 100 %.Body mass index is 24.03 kg/m.  See Md's SRA   Have you used any form of tobacco in the last 30 days? (Cigarettes, Smokeless Tobacco, Cigars, and/or Pipes): No  Has this patient used any form of tobacco in the last 30 days? (Cigarettes, Smokeless Tobacco, Cigars, and/or Pipes): N/A  Blood Alcohol level:  Lab Results  Component Value Date   ETH <10 02/11/2018   ETH <5 85/63/1497   Metabolic Disorder Labs:  Lab Results  Component Value Date   HGBA1C 5.0 02/17/2018   MPG 96.8 02/17/2018   No results found for: PROLACTIN Lab Results  Component Value Date   CHOL 174 02/17/2018   TRIG 129 02/17/2018   HDL 60 02/17/2018   CHOLHDL 2.9 02/17/2018   VLDL 26 02/17/2018   LDLCALC 88 02/17/2018   LDLCALC 69 11/02/2016   See Psychiatric Specialty Exam and Suicide Risk Assessment completed by Attending Physician prior to  discharge.  Discharge destination:  Home  Is patient on multiple antipsychotic therapies at discharge:  No   Has Patient had three or more failed trials of antipsychotic monotherapy by history:  No  Recommended Plan for Multiple Antipsychotic Therapies: NA  Allergies as of 02/18/2018      Reactions   Concerta [methylphenidate] Other (See Comments)   hallucinations   Clindamycin Hcl Rash   Terbinafine Hcl Nausea And Vomiting      Medication List    STOP taking these medications   atomoxetine 25 MG capsule Commonly known as:  STRATTERA   cholecalciferol 1000 units tablet Commonly known as:  VITAMIN D   fluticasone 50 MCG/ACT nasal  spray Commonly known as:  FLONASE   norgestimate-ethinyl estradiol 0.25-35 MG-MCG tablet Commonly known as:  SPRINTEC 28   valACYclovir 1000 MG tablet Commonly known as:  VALTREX     TAKE these medications     Indication  hydrOXYzine 25 MG tablet Commonly known as:  ATARAX/VISTARIL Take 1 tablet (25 mg total) by mouth 3 (three) times daily as needed for anxiety.  Indication:  Feeling Anxious   OLANZapine 10 MG tablet Commonly known as:  ZYPREXA Take 1 tablet (10 mg total) by mouth at bedtime. For mood control  Indication:  Mood control   OLANZapine 5 MG tablet Commonly known as:  ZYPREXA Take 1 tablet (5 mg total) by mouth daily. For agitation/mood control Start taking on:  02/19/2018  Indication:  Agitation/mood control      Follow-up Information    Chucky May, MD Follow up on 03/15/2018.   Specialty:  Psychiatry Why:  Medication management with Dr. Toy Care on Saturday, 03/15/18 at 5:30PM. You have been placed on the cancellation list if anything becomes available sooner.   Contact information: [address] [address] [place] [address] [place] Follow up on 02/19/2018.   Specialty:  Behavioral Health Why:  I could not get you an appointment  with Coralyn Mark until September.  They offered another therapist in [place] for as soon as next week but I did not make an appointment as I did not know what you would want to do.  Call them when you decide what you want to do.  Contact information: [place] [phone]         Follow-up recommendations: Activity:  As tolerated Diet: As recommended by your primary care doctor. Keep all scheduled follow-up appointments as recommended.    Comments: Patient is instructed prior to discharge to: Take all medications as prescribed by his/her mental healthcare provider. Report any adverse effects and or reactions from the medicines to his/her outpatient provider promptly. Patient has been instructed & cautioned: To not engage in alcohol and or illegal drug use while on prescription medicines. In the event of worsening symptoms, patient is instructed to call the crisis hotline, 911 and or go to the nearest ED for appropriate evaluation and treatment of symptoms. To follow-up with his/her primary care provider for your other medical issues, concerns and or health care needs.   Signed: Lindell Spar, NP, PMHNP, FNP-BC 02/18/2018, 10:44 AM  I have reviewed NP's Note, assessement, diagnosis and plan, and agree. I have also met with patient and completed suicide risk assessment.  Lavella Hammock, MD

## 2018-02-18 NOTE — [facility] Group Notes (Signed)
[facility] Group Notes:  (Nursing/MHT/Case Management/Adjunct)  Date:  02/18/2018  Time:  11:27 AM  Type of Therapy:  orientation and goals group  Participation Level:  Active  Participation Quality:  Appropriate  Affect:  Appropriate  Cognitive:  Alert and Appropriate  Insight:  Appropriate and Good  Engagement in Group:  Engaged and Improving  Modes of Intervention:  Clarification, Education and Orientation  Summary of Progress/Problems: Her goal is to focus on staying positive since she is getting discharged today.    Callaway Hardigree J Darek Eifler 02/18/2018, 11:27 AM

## 2018-02-18 NOTE — Progress Notes (Signed)
D:  [name] is up and visible on the unit.  She was noted sitting in the day room much of the evening.  She was very focused on being discharged tomorrow and had multiple questions.  Informed her not to call family until staff has notified you the paperwork has been completed.  She denied SI/HI or A/V hallucinations.  She did however seem paranoid about her medications, having to inspect each pill in the packaging.  She then took without difficulty.  She did come up later and was complaining of shaking/nervousness.  PRN vistaril given.  She was very focused on her roommates well being and wrote a letter to RN stating she didn't think her roommate was ready to leave.  Encouraged her to focus on herself but thanked her for the concern.  She is currently resting with her eyes closed and appears to be in no physical distress.   A:  1:1 with RN for support and encouragement.  Medications as ordered.  Q 15 minute checks maintained for safety.  Encouraged participation in group and unit activities.   R:  [name]  remains safe on the unit.  We will continue to monitor the progress towards her goals.

## 2018-02-18 NOTE — Plan of Care (Signed)
  Problem: Education: Goal: Will be free of psychotic symptoms Outcome: Progressing  [name] continues to inspect each pill in the pack before taking the medication but she then takes the medication without difficulty.  We will continue to monitor the progress towards her goals.

## 2018-02-18 NOTE — Progress Notes (Signed)
Recreation Therapy Notes  INPATIENT RECREATION TR PLAN  Patient Details Name: Jaclyn SladeShekinah Y [name] MRN: [id number] DOB: Jan 07, 1996 Today's Date: 02/18/2018  Rec Therapy Plan Is patient appropriate for Therapeutic Recreation?: Yes Treatment times per week: about 3 days Estimated Length of Stay: 5-7 days TR Treatment/Interventions: Group participation (Comment)  Discharge Criteria Pt will be discharged from therapy if:: Discharged Treatment plan/goals/alternatives discussed and agreed upon by:: Patient/family  Discharge Summary Progress toward goals comments: Groups attended Which groups?: Leisure education, Stress management, Other (Comment)(Triggers) Therapeutic equipment acquired: N/A Reason patient discharged from therapy: Discharge from hospital Pt/family agrees with progress & goals achieved: Yes Date patient discharged from therapy: 02/18/18   Caroll RancherMarjette Alahna Dunne, LRT/CTRS  Lillia AbedLindsay, Idriss Quackenbush A 02/18/2018, 12:04 PM

## 2018-02-18 NOTE — ED Provider Notes (Signed)
[facility] EMERGENCY DEPARTMENT Provider Note   CSN: [id number] Arrival date & time: 02/18/18  1701     History   Chief Complaint Chief Complaint  Patient presents with  . Generalized Body Aches    HPI [name] is a 22 y.o. female.  HPI A was admitted to [facility] and started on Zyprexa.  Started complaining of diffuse myalgias to her face, back, upper and lower extremities today.  Denies fever or chills.  Denies nausea, vomiting or diarrhea.  Mother looked up side effects of Zyprexa which listed rhabdomyolysis.  She brought her daughter to the emergency department.  Daughter is tearful.  Past Medical History:  Diagnosis Date  . ADHD (attention deficit hyperactivity disorder)   . Allergy   . Anxiety   . Bipolar disorder ([facility])   . Eczema 12/11/13    Patient Active Problem List   Diagnosis Date Noted  . Bipolar disorder, current episode manic severe with psychotic features ([facility])   . MDD (major depressive disorder), recurrent severe, without psychosis ([facility]) 02/11/2018  . Pelvic pain 01/31/2018  . Genital herpes simplex 10/24/2017  . Anxiety 08/30/2017  . Severe bipolar I disorder, current or most recent episode depressed ([facility]) 03/30/2017  . Syncope 03/03/2017  . STD exposure 05/13/2015  . Preventative health care 03/16/2013  . Bipolar disorder ([facility]) 12/30/2012  . ADD (attention deficit disorder) 04/24/2011    History reviewed. No pertinent surgical history.   OB History   None      Home Medications    Prior to Admission medications   Medication Sig Start Date End Date Taking? Authorizing Provider  hydrOXYzine (ATARAX/VISTARIL) 25 MG tablet Take 1 tablet (25 mg total) by mouth 3 (three) times daily as needed for anxiety. 02/18/18   Armandina Stammer I, NP  LORazepam (ATIVAN) 1 MG tablet Take 1 tablet (1 mg total) by mouth 3 (three) times daily as needed for anxiety. 02/18/18   Loren Racer, MD  OLANZapine (ZYPREXA) 10 MG tablet Take 1 tablet (10 mg total)  by mouth at bedtime. For mood control 02/18/18   Nwoko, Nicole Kindred I, NP  OLANZapine (ZYPREXA) 5 MG tablet Take 1 tablet (5 mg total) by mouth daily. For agitation/mood control 02/19/18   Sanjuana Kava, NP    Family History Family History  Problem Relation Age of Onset  . Asthma Mother   . Stroke Father   . Heart disease Father   . Asthma Father   . Hypertension Father        pulmonary hypertension  . Heart disease Maternal Uncle   . Hypertension Maternal Grandmother   . COPD Maternal Grandmother   . Diabetes Maternal Grandfather   . Stroke Paternal Grandmother   . Diabetes Paternal Grandmother   . Diabetes Paternal Grandfather   . Leukemia Maternal Uncle     Social History Social History   Tobacco Use  . Smoking status: Never Smoker  . Smokeless tobacco: Never Used  Substance Use Topics  . Alcohol use: Yes    Comment: occ  . Drug use: No     Allergies   Concerta [methylphenidate]; Clindamycin hcl; and Terbinafine hcl   Review of Systems Review of Systems  Constitutional: Negative for chills and fever.  HENT: Negative for facial swelling.   Eyes: Negative for visual disturbance.  Respiratory: Negative for cough and shortness of breath.   Cardiovascular: Negative for chest pain.  Gastrointestinal: Negative for abdominal pain, constipation, diarrhea, nausea and vomiting.  Genitourinary: Negative for  dysuria, flank pain, frequency and hematuria.  Musculoskeletal: Positive for back pain and myalgias. Negative for neck pain and neck stiffness.  Skin: Negative for rash and wound.  Neurological: Negative for dizziness, weakness, light-headedness, numbness and headaches.  All other systems reviewed and are negative.    Physical Exam Updated Vital Signs BP 116/67 (BP Location: Right Arm)   Pulse 96   Temp 98.2 F (36.8 C) (Oral)   Resp 20   Ht 5\' 4"  (1.626 m)   Wt 65.8 kg (145 lb)   LMP 02/09/2018 (Approximate)   SpO2 99%   BMI 24.89 kg/m   Physical Exam    Constitutional: She is oriented to person, place, and time. She appears well-developed and well-nourished.  Tearful  HENT:  Head: Normocephalic and atraumatic.  Mouth/Throat: Oropharynx is clear and moist.  Tender to palpation over mastoids bilaterally.  No intraoral lesions, masses.  No tenderness over the TMJs.  Eyes: Pupils are equal, round, and reactive to light. EOM are normal.  Neck: Normal range of motion. Neck supple.  No meningismus.  No cervical lymphadenopathy.  Cardiovascular: Normal rate and regular rhythm. Exam reveals no gallop and no friction rub.  No murmur heard. Pulmonary/Chest: Effort normal and breath sounds normal. No stridor. No respiratory distress. She has no wheezes. She has no rales. She exhibits no tenderness.  Abdominal: Soft. Bowel sounds are normal. There is no tenderness. There is no rebound and no guarding.  Musculoskeletal: Normal range of motion. She exhibits tenderness. She exhibits no edema.  Diffuse tenderness over the thoracic muscular cure especially on the right.  No midline thoracic or lumbar tenderness.  Patient also has diffuse proximal muscular tenderness of the bilateral upper and lower extremities most pronounced in the left lateral thigh.  No lower extremity asymmetry or swelling.  Distal pulses are 2+.  Neurological: She is alert and oriented to person, place, and time.  5/5 motor in all extremities.  Sensation fully intact.  Skin: Skin is warm and dry. No rash noted. She is not diaphoretic. No erythema.  Nursing note and vitals reviewed.    ED Treatments / Results  Labs (all labs ordered are listed, but only abnormal results are displayed) Labs Reviewed  CBC WITH DIFFERENTIAL/PLATELET - Abnormal; Notable for the following components:      Result Value   HCT 35.6 (*)    All other components within normal limits  COMPREHENSIVE METABOLIC PANEL - Abnormal; Notable for the following components:   Calcium 8.7 (*)    All other components  within normal limits  URINALYSIS, ROUTINE W REFLEX MICROSCOPIC  PREGNANCY, URINE  CK  MAGNESIUM    EKG EKG Interpretation  Date/Time:  Tuesday February 18 2018 19:31:49 EDT Ventricular Rate:  86 PR Interval:    QRS Duration: 92 QT Interval:  359 QTC Calculation: 430 R Axis:   70 Text Interpretation:  Sinus rhythm Confirmed by Loren Racer ([id number]) on 02/18/2018 10:26:52 PM   Radiology No results found.  Procedures Procedures (including critical care time)  Medications Ordered in ED Medications  sodium chloride 0.9 % bolus 1,000 mL (0 mLs Intravenous Stopped 02/18/18 2023)  LORazepam (ATIVAN) injection 0.5 mg (0.5 mg Intravenous Given 02/18/18 1937)  LORazepam (ATIVAN) injection 1 mg (1 mg Intravenous Given 02/18/18 2015)     Initial Impression / Assessment and Plan / ED Course  I have reviewed the triage vital signs and the nursing notes.  Pertinent labs & imaging results that were available during my care of the  patient were reviewed by me and considered in my medical decision making (see chart for details).    Patient is now resting comfortably.  Electrolytes are normal.  Normal CK.  Question whether symptoms related to recent medication or anxiety induced.  Mother states patient has appointment to follow-up with both her therapist and her psychiatrist.  We will give short supply of Ativan as needed as needed for muscle spasm and anxiety.  Return precautions have been given.   Final Clinical Impressions(s) / ED Diagnoses   Final diagnoses:  Myalgia  Anxiety    ED Discharge Orders        Ordered    LORazepam (ATIVAN) 1 MG tablet  3 times daily PRN     02/18/18 2222       Loren RacerYelverton, Lakela Kuba, MD 02/18/18 2227

## 2018-02-18 NOTE — ED Notes (Signed)
Pt c/o IV hurting. Site checked and fluids are running well. No redness or swelling noted. Pt re-assured IV is OK.

## 2018-02-25 ENCOUNTER — Other Ambulatory Visit: Payer: Self-pay

## 2018-03-07 ENCOUNTER — Encounter: Payer: Self-pay | Admitting: Family Medicine

## 2018-03-07 ENCOUNTER — Ambulatory Visit: Payer: BC Managed Care – PPO | Admitting: Family Medicine

## 2018-03-07 DIAGNOSIS — G4441 Drug-induced headache, not elsewhere classified, intractable: Secondary | ICD-10-CM

## 2018-03-07 DIAGNOSIS — F250 Schizoaffective disorder, bipolar type: Secondary | ICD-10-CM | POA: Diagnosis not present

## 2018-03-07 MED ORDER — LORAZEPAM 1 MG PO TABS: 1.0000 mg | ORAL_TABLET | Freq: Three times a day (TID) | ORAL | 0 refills | Status: DC | PRN

## 2018-03-07 NOTE — Patient Instructions (Signed)
Living With Schizoaffective Disorder °If you have been diagnosed with schizoaffective disorder (ScAD), you may be relieved to know why you have felt or behaved a certain way. You may also feel overwhelmed about the treatment ahead, how to get the support you need, and how to deal with the condition day-to-day. With care and support, you can learn to manage your symptoms and live with ScAD. °ScAD is a lifelong chronic condition that may occur in cycle. Periods of severe symptoms may be followed by periods of less severe symptoms or improvement. If you are living with ScAD, there are steps that you can take to help manage the condition and make your life better. °How to manage lifestyle changes °Managing stress °Stress is your body's reaction to life changes and events, both good and bad. For people with ScAD, stress can cause more severe symptoms to start (can be a trigger), so it is important to learn ways to deal with stress. Your health care provider, therapist, or counselor may suggest techniques such as: °· Meditation, muscle relaxation, and breathing exercises. °· Music therapy. This can include creating music or listening to music. °· Life skills training. This training is focused on work, self-care, money, house management, and social skills. ° °Other things you can do to cope with stress include: °· Keeping a stress diary. This can help you learn what causes your stress to start and how you can control your response to those triggers. °· Exercising. Even a short daily walk can help. °· Getting enough sleep. °· Making a schedule to manage your time. Knowing what you will do from day to day helps you avoid feeling overwhelmed by tasks and deadlines. °· Spending time on hobbies you enjoy that help you relax. ° °Medicines °Your health care provider is likely to prescribe various types of medicine depending on your symptoms. These may include one or more of the following types: °· Antipsychotics. °· Mood  stabilizers. °· Antidepressants. ° °Make sure you: °· Talk with your pharmacist or health care provider about all medicines that you take, the possible side effects, and which medicines are safe to take together. °· Make it your goal to take part in all treatment decisions (shared decision-making). Ask about possible side effects of medicines that your health care provider recommends, and tell him or her how you feel about having those side effects. It is best if shared decision-making with your health care provider is part of your total treatment plan. ° °Relationships °Having the support of your family and friends can play a major role in the success of your treatment. The following steps can help you maintain healthy relationships: °· Think about going to couples therapy, family therapy, or family education classes. °· Create a written plan for your treatment, and include close family members and friends in the process. °· Consider bringing your partner or another family member or friend to the appointments you have with your health care provider. ° °How to recognize changes in your condition °If you find that your condition is getting worse, talk to your health care provider right away. Watch for these signs: °· Your mood becomes extreme with either emotional highs or the intense lows of depression. °· Your speech becomes unclear. °· You are disorganized, show the wrong social behaviors, or withdraw from social activities. °· You have racing thoughts and have trouble thinking clearly or staying focused. °· You hear, see, taste, and believe things that others do not. °· You have poor personal hygiene,   weight gain or weight loss, or changes in how you are sleeping or eating. ° °Where to find support °Talking to others °· Reach out to trusted friends or family members, explain your condition, and let them know that you are working with a health care team. °· Consider giving educational materials to friends and  family. °· If you are having trouble telling your friends and family about your condition, keep in mind that honest and open communication can make these conversations easier. °Finances °Be sure to check with your insurance carrier to find out what treatment options are covered by your plan. You may also be able to find financial assistance through not-for-profit organizations or with local government-based resources. °If you are taking medicines, you may be able to get the generic form, which may be less expensive than brand-name medicine. Some makers of prescription medicines also offer help to patients who cannot afford the medicines that they need. °Therapy and support groups °· Make sure you find a counselor or therapist who is familiar with ScAD. Meet with your counselor or therapist once a week or more often if needed. °· Find support programs for people with ScAD, such as local groups associated with the National Alliance on Mental Illness. You can begin your search here: www.nami.org °Follow these instructions at home: °· Take over-the-counter and prescription medicines only as told by your health care provider. Do not start new medicines or stop taking medicines before you ask your health care provider if it is safe to make those changes. °· Avoid caffeine, alcohol, and drugs. They can affect how your medicine works and can make your symptoms worse. °· Eat a healthy diet. °· Keep all follow-up visits as told by your health care provider, therapist, or counselor. This is important. °· Look for support groups in your area so you can meet other people with your condition and learn new coping methods. °Contact a health care provider if: °· You are not able to take your medicines as prescribed. °· Your symptoms get worse. °Get help right away if: °· You have serious thoughts about hurting yourself or others. °If you ever feel like you may hurt yourself or others, or have thoughts about taking your own life, get  help right away. You can go to your nearest emergency department or call: °· Your local emergency services (911 in the [place] °· A suicide crisis helpline, such as the National Suicide Prevention Lifeline at 1-800-273-8255. This is open 24 hours a day. ° °Summary °· Schizoaffective disorder (ScAD) is a lifelong chronic illness. It is best controlled with continuous treatment that includes medicine and therapy. °· Learning ways to deal with stress may help your treatment work better. °· Having the support of your family and friends can be a key to making your treatment a success. °· If you find that your condition is getting worse, talk to your health care provider right away. °This information is not intended to replace advice given to you by your health care provider. Make sure you discuss any questions you have with your health care provider. °Document Released: 11/29/2016 Document Revised: 11/29/2016 Document Reviewed: 11/29/2016 °Elsevier Interactive Patient Education © 2018 [employer] ° °

## 2018-03-07 NOTE — Progress Notes (Signed)
Patient ID: [name], female    DOB: 1995/10/17  Age: 22 y.o. MRN: [id number]    Subjective:  Subjective  HPI [name] presents for f/u behavioral health--  Dx schizoaffective disorder.  Pt has been crying  A lot and having headache more often.  She has been paranoid   She has an appointment with dr Helane RimaKauer next week and saw the psychologist this week.    Review of Systems  Constitutional: Negative for appetite change, diaphoresis, fatigue and unexpected weight change.  Eyes: Negative for pain, redness and visual disturbance.  Respiratory: Negative for cough, chest tightness, shortness of breath and wheezing.   Cardiovascular: Negative for chest pain, palpitations and leg swelling.  Endocrine: Negative for cold intolerance, heat intolerance, polydipsia, polyphagia and polyuria.  Genitourinary: Negative for difficulty urinating, dysuria and frequency.  Neurological: Negative for dizziness, light-headedness, numbness and headaches.  Psychiatric/Behavioral: Positive for agitation and decreased concentration. Negative for self-injury, sleep disturbance and suicidal ideas. The patient is nervous/anxious.     History Past Medical History:  Diagnosis Date  . ADHD (attention deficit hyperactivity disorder)   . Allergy   . Anxiety   . Bipolar disorder (HCC)   . Eczema 12/11/13    She has no past surgical history on file.   Her family history includes Asthma in her father and mother; COPD in her maternal grandmother; Diabetes in her maternal grandfather, paternal grandfather, and paternal grandmother; Heart disease in her father and maternal uncle; Hypertension in her father and maternal grandmother; Leukemia in her maternal uncle; Stroke in her father and paternal grandmother.She reports that she has never smoked. She has never used smokeless tobacco. She reports that she drinks alcohol. She reports that she does not use drugs.  Current Outpatient Medications on File Prior to  Visit  Medication Sig Dispense Refill  . hydrOXYzine (ATARAX/VISTARIL) 25 MG tablet Take 1 tablet (25 mg total) by mouth 3 (three) times daily as needed for anxiety. 60 tablet 0  . OLANZapine (ZYPREXA) 5 MG tablet Take 1 tablet (5 mg total) by mouth daily. For agitation/mood control 30 tablet 0  . OLANZapine (ZYPREXA) 10 MG tablet Take 1 tablet (10 mg total) by mouth at bedtime. For mood control (Patient not taking: Reported on 03/07/2018) 30 tablet 0   No current facility-administered medications on file prior to visit.      Objective:  Objective  Physical Exam  Constitutional: She is oriented to person, place, and time. She appears well-developed and well-nourished.  HENT:  Head: Normocephalic and atraumatic.  Eyes: Conjunctivae and EOM are normal.  Neck: Normal range of motion. Neck supple. No JVD present. Carotid bruit is not present. No thyromegaly present.  Cardiovascular: Normal rate, regular rhythm and normal heart sounds.  No murmur heard. Pulmonary/Chest: Effort normal and breath sounds normal. No respiratory distress. She has no wheezes. She has no rales. She exhibits no tenderness.  Musculoskeletal: She exhibits no edema.  Neurological: She is alert and oriented to person, place, and time.  Psychiatric: She has a normal mood and affect. Her speech is normal. She is agitated and aggressive. Thought content is paranoid and delusional. Cognition and memory are normal. She expresses no homicidal and no suicidal ideation. She expresses no suicidal plans and no homicidal plans.  Nursing note and vitals reviewed.  BP 111/67 (BP Location: Left Arm, Patient Position: Sitting, Cuff Size: Large)   Pulse 99   Temp 98.6 F (37 C) (Oral)   Ht 5\' 4"  (1.626 m)  Wt 150 lb 6.4 oz (68.2 kg)   LMP 02/09/2018 (Approximate)   SpO2 100%   BMI 25.82 kg/m  Wt Readings from Last 3 Encounters:  03/07/18 150 lb 6.4 oz (68.2 kg)  02/18/18 145 lb (65.8 kg)  02/11/18 140 lb (63.5 kg)     Lab  Results  Component Value Date   WBC 8.2 02/18/2018   HGB 12.5 02/18/2018   HCT 35.6 (L) 02/18/2018   PLT 321 02/18/2018   GLUCOSE 90 02/18/2018   CHOL 174 02/17/2018   TRIG 129 02/17/2018   HDL 60 02/17/2018   LDLCALC 88 02/17/2018   ALT 17 02/18/2018   AST 18 02/18/2018   NA 140 02/18/2018   K 4.2 02/18/2018   CL 105 02/18/2018   CREATININE 0.73 02/18/2018   BUN 16 02/18/2018   CO2 27 02/18/2018   TSH 1.20 03/01/2017   HGBA1C 5.0 02/17/2018   MICROALBUR 0.6 08/02/2014    No results found.   Assessment & Plan:  Plan  I am having Jaclyn Slade maintain her hydrOXYzine, OLANZapine, OLANZapine, and LORazepam.  Meds ordered this encounter  Medications  . DISCONTD: LORazepam (ATIVAN) 1 MG tablet    Sig: Take 1 tablet (1 mg total) by mouth 3 (three) times daily as needed for anxiety.    Dispense:  15 tablet    Refill:  0  . LORazepam (ATIVAN) 1 MG tablet    Sig: Take 1 tablet (1 mg total) by mouth 3 (three) times daily as needed for anxiety.    Dispense:  15 tablet    Refill:  0    Problem List Items Addressed This Visit      Unprioritized   Schizoaffective disorder (HCC)    Per psych       Other Visit Diagnoses    Intractable drug-induced headache, not elsewhere classified       Relevant Orders   MR Brain Wo Contrast    headaches more frequent and more intense Check mri since inc in bipolar / schizoaffective symptoms / hallucinations and paranoia  Follow-up: Return if symptoms worsen or fail to improve.  [name], DO

## 2018-03-08 DIAGNOSIS — F259 Schizoaffective disorder, unspecified: Secondary | ICD-10-CM

## 2018-03-08 HISTORY — DX: Schizoaffective disorder, unspecified: F25.9

## 2018-03-08 NOTE — Assessment & Plan Note (Signed)
Per psych 

## 2018-03-09 ENCOUNTER — Inpatient Hospital Stay (HOSPITAL_COMMUNITY)
Admission: RE | Admit: 2018-03-09 | Discharge: 2018-03-14 | DRG: 885 | Disposition: A | Discharge status: Home / Self Care | Payer: BC Managed Care – PPO | Attending: Psychiatry | Admitting: Psychiatry

## 2018-03-09 ENCOUNTER — Other Ambulatory Visit: Payer: Self-pay

## 2018-03-09 ENCOUNTER — Encounter (HOSPITAL_COMMUNITY): Payer: Self-pay

## 2018-03-09 DIAGNOSIS — Z823 Family history of stroke: Secondary | ICD-10-CM

## 2018-03-09 DIAGNOSIS — R45851 Suicidal ideations: Secondary | ICD-10-CM | POA: Diagnosis present

## 2018-03-09 DIAGNOSIS — F419 Anxiety disorder, unspecified: Secondary | ICD-10-CM | POA: Diagnosis not present

## 2018-03-09 DIAGNOSIS — G4700 Insomnia, unspecified: Secondary | ICD-10-CM | POA: Diagnosis not present

## 2018-03-09 DIAGNOSIS — Z8249 Family history of ischemic heart disease and other diseases of the circulatory system: Secondary | ICD-10-CM

## 2018-03-09 DIAGNOSIS — Z91411 Personal history of adult psychological abuse: Secondary | ICD-10-CM | POA: Diagnosis not present

## 2018-03-09 DIAGNOSIS — Z825 Family history of asthma and other chronic lower respiratory diseases: Secondary | ICD-10-CM | POA: Diagnosis not present

## 2018-03-09 DIAGNOSIS — Z91410 Personal history of adult physical and sexual abuse: Secondary | ICD-10-CM | POA: Diagnosis not present

## 2018-03-09 DIAGNOSIS — F312 Bipolar disorder, current episode manic severe with psychotic features: Secondary | ICD-10-CM | POA: Diagnosis present

## 2018-03-09 DIAGNOSIS — R030 Elevated blood-pressure reading, without diagnosis of hypertension: Secondary | ICD-10-CM | POA: Diagnosis present

## 2018-03-09 DIAGNOSIS — F259 Schizoaffective disorder, unspecified: Secondary | ICD-10-CM | POA: Diagnosis present

## 2018-03-09 MED ORDER — TRAZODONE HCL 50 MG PO TABS
50.0000 mg | ORAL_TABLET | Freq: Every evening | ORAL | Status: DC | PRN
  Administered 2018-03-10 – 2018-03-11 (×2): 50 mg via ORAL
  Filled 2018-03-09: qty 1

## 2018-03-09 MED ORDER — LORAZEPAM 2 MG/ML IJ SOLN: 1.0000 mg | Freq: Four times a day (QID) | INTRAMUSCULAR | Status: DC | PRN

## 2018-03-09 MED ORDER — HALOPERIDOL 5 MG PO TABS
5.0000 mg | ORAL_TABLET | Freq: Four times a day (QID) | ORAL | Status: DC | PRN
  Administered 2018-03-10: 5 mg via ORAL
  Filled 2018-03-09: qty 1

## 2018-03-09 MED ORDER — ALUM & MAG HYDROXIDE-SIMETH 200-200-20 MG/5ML PO SUSP: 30.0000 mL | ORAL | Status: DC | PRN

## 2018-03-09 MED ORDER — LORAZEPAM 1 MG PO TABS
1.0000 mg | ORAL_TABLET | Freq: Four times a day (QID) | ORAL | Status: DC | PRN
  Administered 2018-03-09 – 2018-03-11 (×3): 1 mg via ORAL
  Filled 2018-03-09 (×2): qty 1

## 2018-03-09 MED ORDER — MAGNESIUM HYDROXIDE 400 MG/5ML PO SUSP: 30.0000 mL | Freq: Every day | ORAL | Status: DC | PRN

## 2018-03-09 MED ORDER — DIPHENHYDRAMINE HCL 50 MG/ML IJ SOLN: 25.0000 mg | Freq: Four times a day (QID) | INTRAMUSCULAR | Status: DC | PRN

## 2018-03-09 MED ORDER — HALOPERIDOL LACTATE 5 MG/ML IJ SOLN: 5.0000 mg | Freq: Four times a day (QID) | INTRAMUSCULAR | Status: DC | PRN

## 2018-03-09 MED ORDER — ACETAMINOPHEN 325 MG PO TABS
650.0000 mg | ORAL_TABLET | Freq: Four times a day (QID) | ORAL | Status: DC | PRN
  Administered 2018-03-10: 650 mg via ORAL
  Filled 2018-03-09: qty 2

## 2018-03-09 MED ORDER — HYDROXYZINE HCL 25 MG PO TABS
25.0000 mg | ORAL_TABLET | Freq: Three times a day (TID) | ORAL | Status: DC | PRN
  Filled 2018-03-09: qty 1

## 2018-03-09 MED ORDER — PROPRANOLOL HCL 10 MG PO TABS
10.0000 mg | ORAL_TABLET | Freq: Three times a day (TID) | ORAL | Status: DC
  Administered 2018-03-09 – 2018-03-10 (×4): 10 mg via ORAL
  Filled 2018-03-09 (×9): qty 1

## 2018-03-09 MED ORDER — ARIPIPRAZOLE 10 MG PO TABS
10.0000 mg | ORAL_TABLET | Freq: Every day | ORAL | Status: DC
  Administered 2018-03-09 – 2018-03-10 (×2): 10 mg via ORAL
  Filled 2018-03-09 (×4): qty 1

## 2018-03-09 MED ORDER — DIPHENHYDRAMINE HCL 25 MG PO CAPS
25.0000 mg | ORAL_CAPSULE | Freq: Four times a day (QID) | ORAL | Status: DC | PRN
  Administered 2018-03-10 – 2018-03-14 (×5): 25 mg via ORAL
  Filled 2018-03-09 (×4): qty 1
  Filled 2018-03-09: qty 2
  Filled 2018-03-09 (×2): qty 1
  Filled 2018-03-09: qty 2

## 2018-03-09 NOTE — [facility] Group Notes (Signed)
[facility] Group Notes:  (Nursing/MHT/Case Management/Adjunct)  Date:  03/09/2018  Time:  6:01 PM  Type of Therapy:  Nurse Education  Participation Level:  Active  Participation Quality:  Appropriate and Attentive  Affect:  Appropriate  Cognitive:  Alert and Appropriate  Insight:  Improving  Engagement in Group:  Limited  Modes of Intervention:  Activity  Summary of Progress/Problems: Group consisted of various relaxation exercises to meditation music and nature sounds. We tried guided imagery, focusing on breathing, body scan, and observing and describing skills. Patient was engaged in group.  Kirstie MirzaJonathan C Kellen Hover 03/09/2018, 6:01 PM

## 2018-03-09 NOTE — Progress Notes (Signed)
Patient and mother seen by me today as a walk-in.  Wanted to add an note to address medications.  Patient and mom both state that patient has been on Abilify, Zyprexa, Seroquel, Depakote, and they believe Risperdal.  They also believe the patient does have schizoaffective disorder and they are hoping to go to a medication that has a little bit better long-acting effect.  They are interested in an LAI such as Abilify Maintena or [place] Sustenna.  They agreed to restart Abilify 10 mg daily with hopes to start Abilify Maintena soon.  They are also looking for a medication to get patient off of any controlled substances such as the Ativan.  Ativan has been restarted as needed for any anxiety or agitation and propanolol has been started for anxiety as well as due to elevated heart rate and elevated blood pressure.  Also did note the propanolol may assist patient with her medications as the way the patient and the mother reported her symptoms on various medications she may experience some akathisia, but this is not sure as this is only reported has not been seen by me.

## 2018-03-09 NOTE — Progress Notes (Signed)
Verified with patient's mother that patient is taking Triamcinolone:Cetaphil 3:1 cream. Provider Henri Medalravis M., NP notified and order obtained for home medication.
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
## 2018-03-09 NOTE — H&P (Signed)
Behavioral Health Medical Screening Exam  [name] is an 22 y.o. female.  Total Time spent with patient: 20 minutes  Psychiatric Specialty Exam: Physical Exam  Nursing note and vitals reviewed. Constitutional: She is oriented to person, place, and time. She appears well-developed and well-nourished.  Respiratory: Effort normal.  Musculoskeletal: Normal range of motion.  Neurological: She is alert and oriented to person, place, and time.  Skin: Skin is warm.    Review of Systems  Constitutional: Negative.   HENT: Negative.   Eyes: Negative.   Respiratory: Negative.   Cardiovascular: Negative.   Gastrointestinal: Negative.   Genitourinary: Negative.   Musculoskeletal: Negative.   Skin: Negative.   Neurological: Negative.   Endo/Heme/Allergies: Negative.   Psychiatric/Behavioral: Positive for depression and hallucinations.    Blood pressure (!) 135/100, pulse (!) 101, temperature 98.4 F (36.9 C), resp. rate 18, last menstrual period 02/09/2018, SpO2 100 %.There is no height or weight on file to calculate BMI.  General Appearance: Casual  Eye Contact:  Fair  Speech:  Pressured  Volume:  Normal  Mood:  Anxious  Affect:  Flat  Thought Process:  Goal Directed and Descriptions of Associations: Circumstantial  Orientation:  Full (Time, Place, and Person)  Thought Content:  Illogical, Hallucinations: Auditory and Paranoid Ideation  Suicidal Thoughts:  No  Homicidal Thoughts:  No  Memory:  Immediate;   Good Recent;   Good Remote;   Good  Judgement:  Fair  Insight:  Fair  Psychomotor Activity:  Normal  Concentration: Concentration: Fair and Attention Span: Fair  Recall:  Good  Fund of Knowledge:Fair  Language: Good  Akathisia:  No  Handed:  Right  AIMS (if indicated):     Assets:  Communication Skills Desire for Improvement Financial Resources/Insurance Housing Physical Health Social Support Transportation  Sleep:       Musculoskeletal: Strength &  Muscle Tone: within normal limits Gait & Station: normal Patient leans: N/A  Blood pressure (!) 135/100, pulse (!) 101, temperature 98.4 F (36.9 C), resp. rate 18, last menstrual period 02/09/2018, SpO2 100 %.  Recommendations:  Based on my evaluation the patient does not appear to have an emergency medical condition.  Gerlene Burdockravis B Fenna Semel, FNP 03/09/2018, 1:35 PM

## 2018-03-09 NOTE — Progress Notes (Signed)
Pt heard by MHT on hall crying loudly in her room.  MHT went and spoke with Pt, then brought Pt to nurses station to get medication.  Upon coming to nurse's station Pt stated she would only take water, no medication.  Pt was eventually convinced by staff to take Ativan 1 mg PO.  Pt continued to be tearful and told staff member that she believed someone was watching her outside of the window.  Pt requested to be on a 1:1 stating that she does not feel safe.  Pt assured that she is safe and that we would be checking on her all throughout the night.  Pt cautiously returned to bed.  Will continue to monitor.

## 2018-03-09 NOTE — Progress Notes (Signed)
D.  Pt is a new admission to unit, no complaints voiced this evening.  Pt was positive for evening wrap up group, observed engaged in appropriate interaction with peers on the unit.  Pt denies SI/HI/AVH at this time.  A.  Support and encouragement offered  R.  Pt remains safe on the unit, will continue to monitor.

## 2018-03-09 NOTE — Progress Notes (Signed)
Adult Psychoeducational Group Note  Date:  03/09/2018 Time:  9:14 PM  Group Topic/Focus:  Wrap-Up Group:   The focus of this group is to help patients review their daily goal of treatment and discuss progress on daily workbooks.  Participation Level:  Minimal  Participation Quality:  Appropriate  Affect:  Flat  Cognitive:  Appropriate  Insight: Limited  Engagement in Group:  Engaged  Modes of Intervention:  Socialization and Support  Additional Comments:  Patient attended and participated in group tonight. She reports today was a 6 for her due to anxiety. She did get some medication to help her with the anxiety and she is felling better now. Today she also relax herself by coloring and meditation during a group.   [name], [name] [facility] 03/09/2018, 9:14 PM

## 2018-03-09 NOTE — Progress Notes (Signed)
D: Patient has order for EKG and urine specimen collection. Patient notified and she refused, stating, "I don't trust you." She is paranoid and isolative to room. A: Offered support and encouragement. Educated on importance of obtaining urine specimen and EKG. R: Patient resting in room.

## 2018-03-09 NOTE — Progress Notes (Signed)
D: [facility]. She presents disorganized, feeling hopeless, fearful. She was last here 3 weeks ago. She states she is suicidal with a plan to overdose on her psychiatric medications. She is experiencing AVH of people "harrasing me" and wants to commit suicide "because of all of the harrassment and trauma." She repeated multiple times during the assessment "I don't feel like discussing anything right now." She is paranoid and believe that she is in danger, but "I feel safe here." Her body language indicated that she did not feel safe, however. She has some thought blocking as well. She lives with her mother and recently stopped taking her home medications. A: Skin assessment performed per protocol, no contraband noted, and unremarkable except for birthmark to left foot. Patient had no belongings at time of admission. Unit orientation completed. Care plan and unit routines reviewed with patient, understanding verbalized. Emotional support offered to patient. Encouraged patient to voice concerns. Fluids offered to patient. Q15 minute checks initiated for safety on and off unit.  R: Patient cooperative and requesting "a shot so I can go to sleep."

## 2018-03-09 NOTE — Tx Team (Signed)
Initial Treatment Plan 03/09/2018 4:59 PM Trula SladeShekinah Y Everard ZOX:[id number]    PATIENT STRESSORS: Other: Psychosis, off medications   PATIENT STRENGTHS: Financial means Physical Health Supportive family/friends   PATIENT IDENTIFIED PROBLEMS: 1. "Be able to not hear voices"  2. "Go to groups"                   DISCHARGE CRITERIA:  Improved stabilization in mood, thinking, and/or behavior  PRELIMINARY DISCHARGE PLAN: Outpatient therapy Return to previous living arrangement  PATIENT/FAMILY INVOLVEMENT: This treatment plan has been presented to and reviewed with the patient, Trula SladeShekinah Y Prowell.  The patient has been given the opportunity to ask questions and make suggestions.  Kirstie MirzaJonathan C Kailan Carmen, RN 03/09/2018, 4:59 PM

## 2018-03-10 DIAGNOSIS — Z91411 Personal history of adult psychological abuse: Secondary | ICD-10-CM

## 2018-03-10 DIAGNOSIS — F419 Anxiety disorder, unspecified: Secondary | ICD-10-CM

## 2018-03-10 DIAGNOSIS — G4700 Insomnia, unspecified: Secondary | ICD-10-CM

## 2018-03-10 DIAGNOSIS — Z91410 Personal history of adult physical and sexual abuse: Secondary | ICD-10-CM

## 2018-03-10 DIAGNOSIS — F312 Bipolar disorder, current episode manic severe with psychotic features: Principal | ICD-10-CM

## 2018-03-10 LAB — RAPID URINE DRUG SCREEN, HOSP PERFORMED
AMPHETAMINES: NOT DETECTED
BENZODIAZEPINES: NOT DETECTED
Barbiturates: NOT DETECTED
COCAINE: NOT DETECTED
OPIATES: NOT DETECTED
Tetrahydrocannabinol: NOT DETECTED

## 2018-03-10 LAB — PREGNANCY, URINE: Preg Test, Ur: NEGATIVE

## 2018-03-10 MED ORDER — PROPRANOLOL HCL 10 MG PO TABS
10.0000 mg | ORAL_TABLET | ORAL | Status: DC
  Administered 2018-03-11 – 2018-03-12 (×2): 10 mg via ORAL
  Filled 2018-03-10 (×7): qty 1

## 2018-03-10 MED ORDER — HALOPERIDOL 5 MG PO TABS
5.0000 mg | ORAL_TABLET | Freq: Two times a day (BID) | ORAL | Status: DC
  Administered 2018-03-10 – 2018-03-12 (×4): 5 mg via ORAL
  Filled 2018-03-10 (×10): qty 1

## 2018-03-10 MED ORDER — HYDROXYZINE HCL 50 MG PO TABS
50.0000 mg | ORAL_TABLET | Freq: Once | ORAL | Status: AC
  Administered 2018-03-10: 50 mg via ORAL
  Filled 2018-03-10: qty 1

## 2018-03-10 MED ORDER — HYDROXYZINE HCL 50 MG PO TABS
ORAL_TABLET | ORAL | Status: AC
  Filled 2018-03-10: qty 1

## 2018-03-10 NOTE — Progress Notes (Addendum)
Pt moved to quiet room due to continued loud crying.  Pt had stated that she wanted to go to the emergency room as she did not feel safe here.  Pt continues to cry and appear extremely anxious.  Pt stated that voices are telling her to cry.  Once in quiet room, Pt was going to go back to her room but then female peer came up hall to nurses station and requested a hamburger, this caused Pt to retreat again into quiet room.  Writer stayed with Pt while staff encouraged female peer to return to his room.   Informed Press photographerCharge Nurse of this.

## 2018-03-10 NOTE — Progress Notes (Signed)
Adult Psychoeducational Group Note  Date:  03/10/2018 Time:  10:22 PM  Group Topic/Focus:  Wrap-Up Group:   The focus of this group is to help patients review their daily goal of treatment and discuss progress on daily workbooks.  Participation Level:  Minimal  Participation Quality:  Appropriate  Affect:  Appropriate  Cognitive:  Oriented  Insight: Limited  Engagement in Group:  Engaged  Modes of Intervention:  Socialization and Support  Additional Comments:  Patient attended and participated in group tonight. She reports her day was a 9. She slept, socialized. She spoke with her doctor, went for meals and attended her groups  Scot DockFrancis, Taunya Goral Dacosta 03/10/2018, 10:22 PM

## 2018-03-10 NOTE — Progress Notes (Signed)
Recreation Therapy Notes  Date: 7.29.19 Time: 1000 Location: 500 Hall Dayroom  Group Topic: Wellness  Goal Area(s) Addresses:  Patient will define components of whole wellness. Patient will verbalize benefit of whole wellness.  Behavioral Response: Engaged  Intervention:  Music  Activity: Exercise.  LRT lead patients in [name] series of stretches.  LRT then gave each patient the opportunity to lead the group in an appropriate exercise of their choice.  The group completed four rounds of exercises before concluding with [name] cool down.    Education: Wellness, Building control surveyorDischarge Planning.   Education Outcome: Acknowledges education/In group clarification offered/Needs additional education.   Clinical Observations/Feedback:  Pt was active and engaged during activity.  Pt was able to focus on the activity.  Pt was social with peers and had [name] bright affect.       [name], [name]   Jaclyn AbedLindsay, [name] 03/10/2018 11:18 AM

## 2018-03-10 NOTE — Progress Notes (Signed)
Pt up and tearful, asked to return to room.  Pt given prn agitation medication per orders.  Pt requested to go to hospital and to have someone sit with her.  Explained to patient that doctor will speak to her in the AM and that staff are sitting out in hall all night making sure she is safe.  Will continue to monitor closely.
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
## 2018-03-10 NOTE — [facility] Counselor (Addendum)
Adult Comprehensive Assessment  Patient ID: [name], female   DOB: 03-23-1996, 22 y.o.   MRN: [id number]  Information Source: Information source: Patient  Current Stressors: Educational / Learning stressors: Has ADHD. Takes longer to study. Employment / Job issues: Unemployed Family Relationships: Patient reports having a strained relationship with her mother.  Financial / Lack of resources (include bankruptcy): Patient reports she is looking for work, however her mother currently supports her currently.  Housing / Lack of housing: Denies stressors. Physical health (include injuries & life threatening diseases): Denies stressors. Social relationships: Denies stressors. Substance abuse: Denies stressors. Bereavement / Loss: Denies stressors.  Living/Environment/Situation: Living Arrangements: Parents Living conditions (as described by patient or guardian): Good, safe How long has patient lived in current situation?: "For a while" What is atmosphere in current home: Loving, Supportive, Comfortable  Family History: Marital status: Long term relationship Long term relationship, how long?: 7 years What types of issues is patient dealing with in the relationship?: None out of the ordinary Are you sexually active?: Yes What is your sexual orientation?: Straight Does patient have children?: No  Childhood History: By whom was/is the patient raised?: Mother, Father Description of patient's relationship with caregiver when they were a child: Saw father every other weekend, has always been close. Close with mother. Patient's description of current relationship with people who raised him/her: Father - still close. Mother - some trust issues right now. How were you disciplined when you got in trouble as a child/adolescent?: Spanked - had ADHD, so was thought to be acting out, but was actually the ADHD. Does patient have siblings?: Yes Number of Siblings: 1 Description of  patient's current relationship with siblings: SIster - very good relationship normally, but currently some trust issues Did patient suffer any verbal/emotional/physical/sexual abuse as a child?: No Did patient suffer from severe childhood neglect?: No Has patient ever been sexually abused/assaulted/raped as an adolescent or adult?: No Was the patient ever a victim of a crime or a disaster?: No Witnessed domestic violence?: No Has patient been effected by domestic violence as an adult?: No  Education: Highest grade of school patient has completed: Emergency planning/management officer college at [employer] - Associate's degree in Arts Currently a Consulting civil engineer?: No Learning disability?: Yes What learning problems does patient have?: ADHD, combined presentation  Employment/Work Situation: Employment situation: Unemployed What is the longest time patient has a held a job?: 2 years Where was the patient employed at that time?: retail Has patient ever been in the [employer]?: No Are There Guns or Other Weapons in Your Home?: No  Financial Resources: Financial resources: Income from parent Herbalist) Does patient have a representative payee or guardian?: No  Alcohol/Substance Abuse: What has been your use of drugs/alcohol within the last 12 months?: Denies Alcohol/Substance Abuse Treatment Hx: Denies past history Has alcohol/substance abuse ever caused legal problems?: No  Social Support System: Patient's Community Support System: Good Describe Community Support System: Mother, sister, boyfriend, aunt Type of faith/religion: Ephriam Knuckles How does patient's faith help to cope with current illness?: Easy to pray to get through it.  Leisure/Recreation: Leisure and Hobbies: [name] out with friends and boyfriend, go to movies, go swimming.  Strengths/Needs: What things does the patient do well?: Singing, dancing In what areas does patient struggle / problems for patient: Trying to get a job, getting over fear of  driving and use GPS, anxiety  Discharge Plan: Does patient have access to transportation?: Yes Will patient be returning to same living situation after discharge?: Yes Currently  receiving community mental health services: Yes If no, would patient like referral for services when discharged?: Yes ([name] for medication management services and [name] for therapy services) Does patient have financial barriers related to discharge medications?: Yes Patient description of barriers related to discharge medications: No income, but has insurance   Summary/Recommendations:   Summary and Recommendations (to be completed by the evaluator): [name] is a 22 year old female who is diagnosed with Schizoaffective disorder. [facility] to pt having ongoing delusions that she is being watched by people and that people are putting cocaine in her drinks. Pt expressed not understanding why they are doing this but shared that she is sure of this and that it has been going on for several months. [name] currently follows up with [name] for medication management services and [name] for therapy services.    Recommendations:?Patient will benefit from crisis stabilization, medication evaluation, group therapy and psychoeducation, in addition to case management for discharge planning. At discharge it is recommended that Patient adhere to the established discharge plan and continue in treatment.  Anticipated Outcomes:?Mood will be stabilized, crisis will be stabilized, medications will be established if appropriate, coping skills will be taught and practiced, family session will be done to determine discharge plan, mental illness will be normalized, patient will be better equipped to recognize symptoms and ask for assistance.   [name], MSW, LCSW Clinical Social Work 03/10/2018

## 2018-03-10 NOTE — Progress Notes (Addendum)
Nursing Progress Note: 7p-7a D: Pt currently presents with a anxious/euthymic/labile/paranoid affect and behavior. Pt states "I had a fantastic day. Best day ever. I got good sleep. Actually I didn't sleep last night, but it was a good day. I got to focus on my meds and on my growth. I am scared I will begin to tremor and never stop. The doctor is giving me these meds just so to mess me up or maybe not I am not sure." Interacting appropriately with the milieu. Pt reports poor sleep during the previous night with current medication regimen. Pt did attend wrap-up group.  A: Pt provided with medications per providers orders. Pt's labs and vitals were monitored throughout the night. Pt supported emotionally and encouraged to express concerns and questions. Pt educated on medications.  R: Pt's safety ensured with 15 minute and environmental checks. Pt currently denies HI and AVH and passive SI. Pt verbally contracts to seek staff if SI,HI, or AVH occurs and to consult with staff before acting on any harmful thoughts. Will continue to monitor.
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
## 2018-03-10 NOTE — Progress Notes (Signed)
Pt presently in quiet room resting with eyes closed, respirations even and unlabored.  No distress noted at this time.  Will continue to closely monitor.

## 2018-03-10 NOTE — Progress Notes (Signed)
Recreation Therapy Notes  Patient admitted to unit 7.28.19. Due to admission within last year, no new assessment conducted at this time. Last assessment conducted 7.5.19.  Pt reported she was admitted for depression and psychosis.  Patient reports her stressor is still the same in addition to being unemployed.    Patient rated her SI a 7 out of 10 but contracts and stated "I don't want to hurt myself unless it's triggered by something scary".  Patient also rated AVH a 9 out of 10 stating it was hard for her to focus because she is hearing things. Patient reports goal of "be able to rest while I'm here".  Information found below from assessment conducted 7.5.19.  Coping Skills: Sports, TV, Music, Exercise, Meditate, Talk, Art, Prayer, Avoidance, Read, Dance, Hot Bath/Shower  Leisure Interests:  Exercise, Dance, Treadmill, Singing  Patient Strengths:  Positive Attitude; Praying  Patient Areas of Improvement:  Depression; Coping Skills    [name] Lillia AbedLindsay, LRT/CTRS    Caroll RancherLindsay, Marck Mcclenny A 03/10/2018 1:56 PM

## 2018-03-10 NOTE — Tx Team (Signed)
Interdisciplinary Treatment and Diagnostic Plan Update  03/10/2018 Time of Session: [place] MRN: [id number]  Principal Diagnosis: Schizoaffective Disorder  Secondary Diagnoses: Active Problems:   Schizoaffective disorder (HCC)   Current Medications:  Current Facility-Administered Medications  Medication Dose Route Frequency Provider Last Rate Last Dose  . acetaminophen (TYLENOL) tablet 650 mg  650 mg Oral Q6H PRN Money, Lowry Ram, FNP      . alum & mag hydroxide-simeth (MAALOX/MYLANTA) 200-200-20 MG/5ML suspension 30 mL  30 mL Oral Q4H PRN Money, Lowry Ram, FNP      . ARIPiprazole (ABILIFY) tablet 10 mg  10 mg Oral Daily Money, Lowry Ram, FNP   10 mg at 03/10/18 0745  . diphenhydrAMINE (BENADRYL) capsule 25 mg  25 mg Oral Q6H PRN Money, Lowry Ram, FNP   25 mg at 03/10/18 5465   Or  . diphenhydrAMINE (BENADRYL) injection 25 mg  25 mg Intramuscular Q6H PRN Money, Darnelle Maffucci B, FNP      . haloperidol (HALDOL) tablet 5 mg  5 mg Oral Q6H PRN Money, Lowry Ram, FNP   5 mg at 03/10/18 6812   Or  . haloperidol lactate (HALDOL) injection 5 mg  5 mg Intramuscular Q6H PRN Money, Lowry Ram, FNP      . hydrOXYzine (ATARAX/VISTARIL) tablet 25 mg  25 mg Oral TID PRN Money, Lowry Ram, FNP      . LORazepam (ATIVAN) tablet 1 mg  1 mg Oral Q6H PRN Money, Lowry Ram, FNP   1 mg at 03/09/18 2357   Or  . LORazepam (ATIVAN) injection 1 mg  1 mg Intramuscular Q6H PRN Money, Darnelle Maffucci B, FNP      . magnesium hydroxide (MILK OF MAGNESIA) suspension 30 mL  30 mL Oral Daily PRN Money, Lowry Ram, FNP      . propranolol (INDERAL) tablet 10 mg  10 mg Oral TID Money, Lowry Ram, FNP   10 mg at 03/10/18 0746  . traZODone (DESYREL) tablet 50 mg  50 mg Oral QHS PRN Money, Lowry Ram, FNP       PTA Medications: Medications Prior to Admission  Medication Sig Dispense Refill Last Dose  . hydrOXYzine (ATARAX/VISTARIL) 25 MG tablet Take 1 tablet (25 mg total) by mouth 3 (three) times daily as needed for anxiety. 60 tablet 0  Taking  . LORazepam (ATIVAN) 1 MG tablet Take 1 tablet (1 mg total) by mouth 3 (three) times daily as needed for anxiety. 15 tablet 0   . OLANZapine (ZYPREXA) 10 MG tablet Take 1 tablet (10 mg total) by mouth at bedtime. For mood control (Patient not taking: Reported on 03/07/2018) 30 tablet 0 Not Taking  . OLANZapine (ZYPREXA) 5 MG tablet Take 1 tablet (5 mg total) by mouth daily. For agitation/mood control 30 tablet 0 Taking    Patient Stressors: Other: Psychosis, off medications  Patient Strengths: Scientist, research (life sciences) Physical Health Supportive family/friends  Treatment Modalities: Medication Management, Group therapy, Case management,  1 to 1 session with clinician, Psychoeducation, Recreational therapy.   Physician Treatment Plan for Primary Diagnosis: Schizoaffective Disorder  Medication Management: Evaluate patient's response, side effects, and tolerance of medication regimen.  Therapeutic Interventions: 1 to 1 sessions, Unit Group sessions and Medication administration.  Evaluation of Outcomes: Not Met  Physician Treatment Plan for Secondary Diagnosis: Active Problems:   Schizoaffective disorder ([place])  Medication Management: Evaluate patient's response, side effects, and tolerance of medication regimen.  Therapeutic Interventions: 1 to 1 sessions, Unit Group sessions and Medication administration.  Evaluation of  Outcomes: Not Met   RN Treatment Plan for Primary Diagnosis: Schizoaffective Disorder  Long Term Goal(s): Knowledge of disease and therapeutic regimen to maintain health will improve  Short Term Goals: Ability to remain free from injury will improve, Ability to verbalize frustration and anger appropriately will improve, Ability to demonstrate self-control and Ability to participate in decision making will improve  Medication Management: RN will administer medications as ordered by provider, will assess and evaluate patient's response and provide education to patient  for prescribed medication. RN will report any adverse and/or side effects to prescribing provider.  Therapeutic Interventions: 1 on 1 counseling sessions, Psychoeducation, Medication administration, Evaluate responses to treatment, Monitor vital signs and CBGs as ordered, Perform/monitor CIWA, COWS, AIMS and Fall Risk screenings as ordered, Perform wound care treatments as ordered.  Evaluation of Outcomes: Not Met   LCSW Treatment Plan for Primary Diagnosis: Schizoaffective Disorder  Long Term Goal(s): Safe transition to appropriate next level of care at discharge, Engage patient in therapeutic group addressing interpersonal concerns.  Short Term Goals: Engage patient in aftercare planning with referrals and resources, Increase social support, Increase emotional regulation and Facilitate acceptance of mental health diagnosis and concerns  Therapeutic Interventions: Assess for all discharge needs, 1 to 1 time with Social worker, Explore available resources and support systems, Assess for adequacy in community support network, Educate family and significant other(s) on suicide prevention, Complete Psychosocial Assessment, Interpersonal group therapy.  Evaluation of Outcomes: Not Met   Progress in Treatment: Attending groups: No. New to unit. Continuing to assess.  Participating in groups: No. Taking medication as prescribed: Yes. Toleration medication: Yes. Family/Significant other contact made: No, will contact:  family member if pt consents to collateral contact.  Patient understands diagnosis: Yes. Discussing patient identified problems/goals with staff: Yes. Medical problems stabilized or resolved: Yes. Denies suicidal/homicidal ideation: Yes. Issues/concerns per patient self-inventory: No. Other: n/a   New problem(s) identified: No, Describe:  n/a  New Short Term/Long Term Goal(s): elimination of AH, medication management for mood stabilization; elimination of SI thoughts;  development of comprehensive mental wellness/sobriety plan.   Patient Goals:  To be able to not hear voices and go to groups.   Discharge Plan or Barriers: CSW assessing for appropriate referrals. [place] pamphlet, Mobile Crisis information provided to patient for additional community support and resources.   Reason for Continuation of Hospitalization: Anxiety Depression Hallucinations Medication stabilization Suicidal ideation  Estimated Length of Stay: Friday, 03/14/18  Attendees: Patient: 03/10/2018 11:10 AM  Physician: Dr. Leverne Humbles MD 03/10/2018 11:10 AM  Nursing: Nicoletta Dress RN; Elizabeth RN 03/10/2018 11:10 AM  RN Care Manager: x 03/10/2018 11:10 AM  Social Worker: Janice Norrie LCSW 03/10/2018 11:10 AM  Recreational Therapist: x 03/10/2018 11:10 AM  Other: Lindell Spar NP; Benjamine Mola NP 03/10/2018 11:10 AM  Other:  03/10/2018 11:10 AM  Other: 03/10/2018 11:10 AM    Scribe for Treatment Team: Avelina Laine, LCSW 03/10/2018 11:10 AM

## 2018-03-10 NOTE — Plan of Care (Signed)
  Problem: Education: Goal: Emotional status will improve Outcome: Not Progressing Goal: Mental status will improve Outcome: Not Progressing   Problem: Education: Goal: Mental status will improve Outcome: Not Progressing   Problem: Activity: Goal: Interest or engagement in activities will improve Outcome: Progressing   Problem: Physical Regulation: Goal: Ability to maintain clinical measurements within normal limits will improve Outcome: Progressing   Problem: Safety: Goal: Periods of time without injury will increase Outcome: Progressing  DAR NOTE: Patient appears guarded, suspicious and paranoid.  Has poor boundary issues and is intrusive. Patient continues to need a lot of redirection on the unit.  Reports voices telling her to call the cops and bad things.  Reports suicidal thoughts with no plan but contracts for safety.  Continues to report people out to get her.  Requesting to be placed on witness protection program for safety.  Rates depression at 5, hopelessness at 7, and anxiety at 5.  Maintained on routine safety checks.  Medications given as prescribed.  Support and encouragement offered as needed.  Attended group and participated.  States goal for today is "my sleep schedule and pain."  patient is safe on and off the unit.

## 2018-03-10 NOTE — [facility] Suicide Risk Assessment (Signed)
[facility] INPATIENT:  Family/Significant Other Suicide Prevention Education  Suicide Prevention Education:   Education Completed; Psychologist, educationalamela Boggus/Mother, has been identified by the patient as the family member/significant other with whom the patient will be residing, and identified as the person(s) who will aid the patient in the event of a mental health crisis (suicidal ideations/suicide attempt).  With written consent from the patient, the family member/significant other has been provided the following suicide prevention education, prior to the and/or following the discharge of the patient.  The suicide prevention education provided includes the following:  Suicide risk factors  Suicide prevention and interventions  National Suicide Hotline telephone number  [facility] assessment telephone number  [facility] 911  [facility] and/or [facility] Crisis [facility] telephone number  Request made of family/significant other to:  Remove weapons (e.g., guns, rifles, knives), all items previously/currently identified as safety concern.    Remove drugs/medications (over-the-counter, prescriptions, illicit drugs), all items previously/currently identified as a safety concern.  The family member/significant other verbalizes understanding of the suicide prevention education information provided.  The family member/significant other agrees to remove the items of safety concern listed above. Mother stated there are no guns in the home. Mother stated meds are stored in her bedroom and she dispenses them to patient. Mother stated she has scissors all over the house and knives are located in the kitchen. CSW recommended locking knives, scissors and razors out of patient's reach. Mother stated patient has been intensely afraid of blood and does not think patient would commit suicide by cutting.    Roselyn Beringegina Gabrianna Fassnacht, MSW, LCSW Clinical Social  Work [phone] 03/10/2018, 2:19 PM
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
## 2018-03-11 LAB — LIPID PANEL
Cholesterol: 182 mg/dL (ref 0–200)
HDL: 75 mg/dL (ref >40)
LDL Cholesterol: 97 mg/dL (ref 0–99)
Total CHOL/HDL Ratio: 2.4 RATIO
Triglycerides: 49 mg/dL (ref <150)
VLDL: 10 mg/dL (ref 0–40)

## 2018-03-11 LAB — COMPREHENSIVE METABOLIC PANEL
ALT: 15 U/L (ref 0–44)
AST: 17 U/L (ref 15–41)
Albumin: 4 g/dL (ref 3.5–5.0)
Alkaline Phosphatase: 43 U/L (ref 38–126)
Anion gap: 7 (ref 5–15)
BUN: 12 mg/dL (ref 6–20)
CHLORIDE: 106 mmol/L (ref 98–111)
CO2: 27 mmol/L (ref 22–32)
Calcium: 9.5 mg/dL (ref 8.9–10.3)
Creatinine, Ser: 0.64 mg/dL (ref 0.44–1.00)
GFR calc Af Amer: >60 mL/min (ref >60)
Glucose, Bld: 102 mg/dL — ABNORMAL HIGH (ref 70–99)
POTASSIUM: 3.7 mmol/L (ref 3.5–5.1)
SODIUM: 140 mmol/L (ref 135–145)
Total Bilirubin: 1.1 mg/dL (ref 0.3–1.2)
Total Protein: 7.5 g/dL (ref 6.5–8.1)

## 2018-03-11 LAB — CBC
HCT: 38.1 % (ref 36.0–46.0)
Hemoglobin: 13 g/dL (ref 12.0–15.0)
MCH: 30.4 pg (ref 26.0–34.0)
MCHC: 34.1 g/dL (ref 30.0–36.0)
MCV: 89.2 fL (ref 78.0–100.0)
PLATELETS: 410 10*3/uL — AB (ref 150–400)
RBC: 4.27 MIL/uL (ref 3.87–5.11)
RDW: 13.9 % (ref 11.5–15.5)
WBC: 7.2 10*3/uL (ref 4.0–10.5)

## 2018-03-11 LAB — HEMOGLOBIN A1C
Hgb A1c MFr Bld: 4.9 % (ref 4.8–5.6)
Mean Plasma Glucose: 93.93 mg/dL

## 2018-03-11 LAB — TSH: TSH: 2.069 u[IU]/mL (ref 0.350–4.500)

## 2018-03-11 MED ORDER — BENZTROPINE MESYLATE 1 MG PO TABS
2.0000 mg | ORAL_TABLET | Freq: Two times a day (BID) | ORAL | Status: DC | PRN
  Administered 2018-03-11 – 2018-03-12 (×2): 2 mg via ORAL
  Filled 2018-03-11 (×2): qty 2

## 2018-03-11 MED ORDER — HALOPERIDOL DECANOATE 100 MG/ML IM SOLN
100.0000 mg | Freq: Once | INTRAMUSCULAR | Status: AC
  Administered 2018-03-11: 100 mg via INTRAMUSCULAR
  Filled 2018-03-11: qty 1
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
## 2018-03-11 NOTE — Progress Notes (Signed)
Recreation Therapy Notes  Date: 7.30.19 Time: 1000 Location: 500 Hall Dayroom  Group Topic: Coping Skills  Goal Area(s) Addresses:  Patient will be able to identify positive coping skills. Patient will be able to identify benefits of using positive coping skills post d/c.  Behavioral Response: Engaged  Intervention: Worksheet, pencils  Activity: Mindmap.  Patients were given a blank mind map.  LRT and patients filled in the first 8 boxes together (death, anxiety, anger, depression, PTSD, chronic/terminal illness, finances and cultural differences).  Patients were to then come up with coping skills for each situation individually.  The group would then come back together and LRT would fill in the coping skills on the board.  Education: PharmacologistCoping Skills, Building control surveyorDischarge Planning.   Education Outcome: Acknowledges understanding/In group clarification offered/Needs additional education.   Clinical Observations/Feedback: Pt was quiet but engaged when prompted.  Pt stated some of her coping skills were prayer, use "I feel" statements, count to 10, play a sport and talk to someone.   Caroll RancherMarjette Domnick Chervenak, LRT/CTRS    Lillia AbedLindsay, Aeriana Speece A 03/11/2018 11:19 AM

## 2018-03-11 NOTE — Progress Notes (Signed)
Nursing Progress Note: 7p-7a D: Pt currently presents with a anxious/paranoid/somatic affect and behavior. Pt states "I  Have tongue swelling from the meds. My mom told me not take any more meds that start with a 'p'. I don't trust it. I want a new doctor." Interacting minimally with the milieu. Pt reports good sleep during the previous night with current medication regimen.   A: Pt sticking out tongue and claiming the action is involuntary and intermittent. Pt complains of tremors, but this can neither be felt or seen by Clinical research associatewriter. Patient claims her tongue is swollen. Both, patient states, as a result of the medicines she is taking. Pt airway clear and unobstructed.  Pt provided with medications per providers orders. Pt's labs and vitals were monitored throughout the night. Pt supported emotionally and encouraged to express concerns and questions. Pt educated on medications.  R: Pt's safety ensured with 15 minute and environmental checks. Pt currently denies SI, HI, and AVH. Pt verbally contracts to seek staff if SI,HI, or AVH occurs and to consult with staff before acting on any harmful thoughts. Will continue to monitor.

## 2018-03-11 NOTE — Progress Notes (Signed)
D: Pt continues to be anxious / restless, paranoid and suspicious.Continues to need multiple redirections. Continues to endorse passive SI; verbally contracts for safety. Denies HI, AVH and pain when assessed. Rates her anxiety 6/10, hopelessness 0/10 and depression 0/10. Pt attended scheduled groups as encouraged.  A: All medications given as ordered. Emotional support and encouragement provided. Safety checks maintained without self harm gestures or outburst. R: Pt tolerates all PO intake well. Remains safe on and off unit.

## 2018-03-12 MED ORDER — TRAZODONE HCL 50 MG PO TABS
50.0000 mg | ORAL_TABLET | Freq: Every evening | ORAL | Status: DC | PRN
  Administered 2018-03-12 (×2): 50 mg via ORAL
  Filled 2018-03-12 (×4): qty 1

## 2018-03-12 MED ORDER — HALOPERIDOL 5 MG PO TABS
5.0000 mg | ORAL_TABLET | Freq: Every day | ORAL | Status: DC
  Administered 2018-03-13 – 2018-03-14 (×2): 5 mg via ORAL
  Filled 2018-03-12 (×3): qty 1

## 2018-03-12 MED ORDER — METOPROLOL SUCCINATE ER 25 MG PO TB24
25.0000 mg | ORAL_TABLET | Freq: Every day | ORAL | Status: DC
  Administered 2018-03-13 – 2018-03-14 (×2): 25 mg via ORAL
  Filled 2018-03-12 (×3): qty 1

## 2018-03-12 MED ORDER — HALOPERIDOL 2 MG PO TABS: 2.0000 mg | ORAL_TABLET | Freq: Every day | ORAL | Status: DC

## 2018-03-12 MED ORDER — DIPHENHYDRAMINE HCL 25 MG PO CAPS
25.0000 mg | ORAL_CAPSULE | Freq: Every evening | ORAL | Status: DC | PRN
  Administered 2018-03-12 (×2): 25 mg via ORAL
  Filled 2018-03-12 (×8): qty 1

## 2018-03-12 MED ORDER — BENZTROPINE MESYLATE 2 MG PO TABS
2.0000 mg | ORAL_TABLET | Freq: Every day | ORAL | Status: DC
  Filled 2018-03-12: qty 1

## 2018-03-12 MED ORDER — METOPROLOL SUCCINATE ER 25 MG PO TB24
25.0000 mg | ORAL_TABLET | Freq: Every day | ORAL | Status: DC
  Administered 2018-03-12: 25 mg via ORAL
  Filled 2018-03-12 (×3): qty 1
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
## 2018-03-12 NOTE — BHH Counselor (Signed)
CSW called [facility] to inquire about referring patient. CSW got the intake coordinator's voicemail and left her a message. CSW asked for return call.   Johnatan Baskette S. Montford Barg, LCSWA, MSW [facility]  [phone]    

## 2018-03-12 NOTE — Progress Notes (Signed)
Recreation Therapy Notes  Date: 7.31.19 Time: 1000 Location: 500 Hall Dayroom  Group Topic: Anxiety  Goal Area(s) Addresses:  Patient will be able to identify triggers to anxiety.  Patient will be able to identify physical symptoms of anxiety. Patient will identify positive coping skills to use for anxiety post d/c.  Behavioral Response: Engaged  Intervention: Worksheet, pencils  Activity: Intro. To Anxiety.  Patients were to identify triggers, physical symptoms, thoughts and coping skills that pertain to anxiety.  Education: Communication, Discharge Planning  Education Outcome: Acknowledges understanding/In group clarification offered/Needs additional education.   Clinical Observations/Feedback: Pt arrived late but fully engaged in the activity.  Pt identified her triggers as "people using my weakness against me, eating in uncomfortable places and bad vibes".  Pt also expressed her physical symptoms to anxiety were muscle spasms, heart rate speeds up and one eye twitches.  Pt stated she has suicidal thought, depression and anxiety when anxious.  Pt identified her coping skills as deep breathing, prayer and positive reinforcement.    Caroll RancherMarjette Czarina Gingras, LRT/CTRS     Lillia AbedLindsay, Aryan Sparks A 03/12/2018 11:10 AM

## 2018-03-12 NOTE — Progress Notes (Signed)
D: Pt attended scheduled groups and was engaged. Denies SI, HI, AVh and pain this shift. Rates her depression and hopelessness 0/10, anxiety 3/10. Reports she's sleeping and eating well. Goal this shift is to "decrease my anxiety" which she plans to attend by "staying away from people that increases it". Continues to be childlike and somatic but is verbally redirectable. A: Emotional support offered to pt. Encouraged pt to voice concerns. All medications administered as ordered with verbal education and effects monitored. Safety checks maintained at Q 15 minutes intervals without self harm gestures.  R: Compliant with medications when offered. Denies concerns. POC maintained for safety and mood stability.

## 2018-03-12 NOTE — Progress Notes (Signed)
Nursing Progress Note: 7p-7a D: Pt currently presents with a anxious/euthymic/labile/paranoid affect and behavior. Interacting appropriately with the milieu. Pt reports good sleep during the previous night with current medication regimen.   A: Pt provided with medications per providers orders. Pt's labs and vitals were monitored throughout the night. Pt supported emotionally and encouraged to express concerns and questions. Pt educated on medications.  R: Pt's safety ensured with 15 minute and environmental checks. Pt currently denies HI and AVH and passive SI. Pt verbally contracts to seek staff if SI,HI, or AVH occurs and to consult with staff before acting on any harmful thoughts. Will continue to monitor.

## 2018-03-12 NOTE — Progress Notes (Signed)
Pt requests ativan and states she will start seizing at any time if she cannot get any. Will continue to monitor.

## 2018-03-13 MED ORDER — DIPHENHYDRAMINE HCL 25 MG PO CAPS
25.0000 mg | ORAL_CAPSULE | Freq: Every day | ORAL | Status: DC
  Filled 2018-03-13: qty 1

## 2018-03-13 MED ORDER — FLUVOXAMINE MALEATE 50 MG PO TABS
50.0000 mg | ORAL_TABLET | Freq: Every day | ORAL | Status: DC
  Administered 2018-03-13: 50 mg via ORAL
  Filled 2018-03-13 (×2): qty 1

## 2018-03-13 MED ORDER — BENZTROPINE MESYLATE 1 MG PO TABS
1.0000 mg | ORAL_TABLET | Freq: Once | ORAL | Status: AC
  Administered 2018-03-13: 1 mg via ORAL
  Filled 2018-03-13 (×2): qty 1

## 2018-03-13 MED ORDER — HALOPERIDOL 5 MG PO TABS
10.0000 mg | ORAL_TABLET | Freq: Every day | ORAL | Status: DC
  Filled 2018-03-13 (×2): qty 2

## 2018-03-13 MED ORDER — QUETIAPINE FUMARATE 50 MG PO TABS
50.0000 mg | ORAL_TABLET | Freq: Once | ORAL | Status: AC
Start: 2018-03-13 — End: 2018-03-13
  Administered 2018-03-13: 50 mg via ORAL
  Filled 2018-03-13 (×2): qty 1

## 2018-03-13 NOTE — BHH Counselor (Signed)
CSW called and spoke with [name] at [facility] (patient was present in the room). CSW assisted patient in completing the intake process via phone. Pat scheduled patient for initial appointment to complete CCA on 03/20/18 at 12:30 PM with [place] Hargrove. Per [name], "once her paperwork is completed she should be able to be admitted during the next week."   Deanglo Hissong S. Locklan Canoy, LCSWA, MSW [facility]  [phone]

## 2018-03-13 NOTE — Progress Notes (Signed)
Recreation Therapy Notes  Date: 8.1.19 Time: 1000 Location: 500 Hall Dayroom   Group Topic: Communication, Team Building, Problem Solving  Goal Area(s) Addresses:  Patient will effectively work with peer towards shared goal.  Patient will identify skill used to make activity successful.  Patient will identify how skills used during activity can be used to reach post d/c goals.   Intervention: STEM Activity   Activity: [place] [place] [place]. Patients were provided the following materials: 5 drinking straws, 5 rubber bands, 5 paper clips, 2 index cards, 2 drinking cups, and 2 toilet paper rolls. Using the provided materials patients were asked to build a launching mechanisms to launch a ping pong ball approximately 12 feet. Patients were divided into teams of 3-5.   Education: Pharmacist, communityocial Skills, Building control surveyorDischarge Planning.   Education Outcome: Acknowledges education/In group clarification offered/Needs additional education.   Clinical Observations/Feedback: Pt did not attend group.     Caroll RancherMarjette Hollie Wojahn, LRT/CTRS         Caroll RancherLindsay, Adrea Sherpa A 03/13/2018 11:34 AM

## 2018-03-13 NOTE — Plan of Care (Signed)
D: Pt  passive SI / AH- contracts for safety. denies HI/VH. Pt is very somatic, pt stated she had an issue with the Haldol" It gives me extreme highs and lows, I rock back and forth, cry, Extreme tremors, I gag and throw up" . Spoke with pt and she stated all the Anti-psychotics she has tried has done that to her , but she stated Seroquel did not do that to her. NP - Barbara CowerJason gave pt 1 x of mg Cogentin and 50 mg Seroquel . Pt has been very paranoid and suspicious this evening. " I need to go to the emergency room , I think I have a Tumor".  A: Pt was offered support and encouragement. Pt was given scheduled medications. Pt was encourage to attend groups. Q 15 minute checks were done for safety.  R: safety maintained on unit.   Problem: Education: Goal: Emotional status will improve Outcome: Progressing   Problem: Education: Goal: Mental status will improve Outcome: Progressing   Problem: Activity: Goal: Sleeping patterns will improve Outcome: Progressing
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
## 2018-03-13 NOTE — Plan of Care (Signed)
  Problem: Activity: Goal: Interest or engagement in activities will improve Outcome: Progressing   Problem: Physical Regulation: Goal: Ability to maintain clinical measurements within normal limits will improve Outcome: Progressing   Problem: Safety: Goal: Periods of time without injury will increase Outcome: Progressing  DAR NOTE: Patient presents with anxious affect and mood.  Denies suicidal thoughts, pain, auditory and visual hallucinations.  Described energy level as normal and concentration as good.  Rates depression at 0, hopelessness at 0, and anxiety at 3.  Maintained on routine safety checks.  Medications given as prescribed.  Support and encouragement offered as needed.  Attended group and participated.  States goal for today is "to decrease my anxiety."  Patient observed socializing with peers in the dayroom.  Continues to need a lot of redirection on the unit.  Patient is safe on and off the unit.

## 2018-03-13 NOTE — [facility] Counselor (Addendum)
CSW called [facility] to inquire about referring patient. CSW got the intake coordinator's voicemail and left her a message. CSW asked for return call.   Jersey Espinoza S. Randye Treichler, LCSWA, MSW [facility]  [phone]

## 2018-03-14 MED ORDER — FLUVOXAMINE MALEATE 50 MG PO TABS
50.0000 mg | ORAL_TABLET | Freq: Every day | ORAL | Status: DC
  Administered 2018-03-14: 50 mg via ORAL
  Filled 2018-03-14 (×2): qty 1

## 2018-03-14 MED ORDER — HALOPERIDOL DECANOATE 100 MG/ML IM SOLN
100.0000 mg | INTRAMUSCULAR | Status: DC
  Administered 2018-03-14: 100 mg via INTRAMUSCULAR
  Filled 2018-03-14: qty 1

## 2018-03-14 MED ORDER — HALOPERIDOL 5 MG PO TABS: 5.0000 mg | ORAL_TABLET | Freq: Every day | ORAL | 0 refills | Status: DC

## 2018-03-14 MED ORDER — HYDROXYZINE HCL 25 MG PO TABS: 25.0000 mg | ORAL_TABLET | Freq: Three times a day (TID) | ORAL | 0 refills | Status: DC | PRN

## 2018-03-14 MED ORDER — METOPROLOL SUCCINATE ER 25 MG PO TB24: 25.0000 mg | ORAL_TABLET | Freq: Every day | ORAL | 0 refills | Status: DC

## 2018-03-14 MED ORDER — HALOPERIDOL DECANOATE 100 MG/ML IM SOLN: 100.0000 mg | INTRAMUSCULAR | 0 refills | Status: DC

## 2018-03-14 MED ORDER — FLUVOXAMINE MALEATE 50 MG PO TABS: 50.0000 mg | ORAL_TABLET | Freq: Every day | ORAL | 0 refills | Status: DC

## 2018-03-14 NOTE — Progress Notes (Signed)
  [facility] Dis[facility] :  Will you be returning to the same living situation after discharge:  Yes,  [name] At discharge, do you have transportation [name]?: Yes,  mother Do you have the ability to pay for your medications: Yes,  [employer] insurance  Release of information consent forms completed and submitted to medical records by CSW.  Patient to Follow up at: Follow-up Information    [name], Rupinder, MD Follow up on 03/15/2018.   Specialty:  Psychiatry Why:  Hospital follow-up on Saturday, 8/3 at 5:30PM with Dr. Evelene CroonKaur. Thank you.  Contact information: [address] [address] [place] [address] [phone]        [facility]. Go on 03/20/2018.   Why:  Patient will meet with Jaclyn HomeBrittany [name] at 12:30 PM on 03/20/18 to complete comprehensive clinical assessment.  Contact information: [address] [place], [place] [address] Phone: [phone] Fax: [phone]           Next level of care provider has access to [facility] Link:no  Safety Planning and Suicide Prevention discussed: Yes,  SPE completed with pt's mother and with pt. SPI pamphlet and Mobile Crisis information also provided   Have you used any form of tobacco in the last 30 days? (Cigarettes, Smokeless Tobacco, Cigars, and/or Pipes): No  Has patient been referred to the Quitline?: N/A patient is not a smoker  Patient has been referred for addiction treatment: N/A  Rona RavensHeather S [name], LCSW 03/14/2018, 9:06 AM

## 2018-03-14 NOTE — Progress Notes (Signed)
Recreation Therapy Notes  Date: 8.2.19 Time: 1000 Location: 500 Hall  Group Topic: Communication, Team building  Goal Area(s) Addresses:  Patient will effectively communicate with peers in group.  Patient will work together to complete task. Patient will verbalize benefits of positive communication and teamwork post d/c.  Behavioral Response: Engaged  Intervention: Veterinary surgeonubber discs  Activity: [place].  Each person was given a rubber disc and one disc was given to the group.  Patients had to use the discs to move from one end of the hall to the other and then comeback to the starting point.  Patients were to stand on discs the entire time.  If anyone stepped of their disc, the group had to start over.  Education: Communication, Discharge Planning  Education Outcome: Acknowledges understanding/In group clarification offered/Needs additional education.   Clinical Observations/Feedback: Pt was engaged and worked well with her partner.  Pt appeared anxious at one point and stated she was having a panic attack.  Pt started doing some deep breathing exercises and seemed to calm down.  Pt was unable to complete the second part of the activity.     Caroll RancherMarjette Joshlynn Alfonzo, LRT/CTRS    Lillia AbedLindsay, Cordarrel Stiefel A 03/14/2018 11:12 AM

## 2018-03-14 NOTE — Progress Notes (Signed)
Pt stated she slept good on the Seroquel and Cogentin

## 2018-03-14 NOTE — Plan of Care (Signed)
Pt was able to identify positive coping skills to decrease depression at conclusion of recreation therapy group session.   Caroll RancherMarjette Jerico Grisso, LRT/CTRS

## 2018-03-14 NOTE — Tx Team (Signed)
Interdisciplinary Treatment and Diagnostic Plan Update  03/14/2018 Time of Session: 0830AM [name] MRN: [id number]  Principal Diagnosis: Schizoaffective Disorder  Secondary Diagnoses: Principal Problem:   Bipolar disorder, current episode manic severe with psychotic features [facility] Active Problems:   Schizoaffective disorder (HCC)   Current Medications:  Current Facility-Administered Medications  Medication Dose Route Frequency Provider Last Rate Last Dose  . acetaminophen (TYLENOL) tablet 650 mg  650 mg Oral Q6H PRN Money, Gerlene Burdock, FNP   650 mg at 03/10/18 1137  . alum & mag hydroxide-simeth (MAALOX/MYLANTA) 200-200-20 MG/5ML suspension 30 mL  30 mL Oral Q4H PRN Money, Feliz Beam B, FNP      . diphenhydrAMINE (BENADRYL) capsule 25 mg  25 mg Oral Q6H PRN Money, Gerlene Burdock, FNP   25 mg at 03/14/18 4098   Or  . diphenhydrAMINE (BENADRYL) injection 25 mg  25 mg Intramuscular Q6H PRN Money, Feliz Beam B, FNP      . diphenhydrAMINE (BENADRYL) capsule 25 mg  25 mg Oral QHS,MR X 1 Mariel Craft, MD   25 mg at 03/12/18 2056  . fluvoxaMINE (LUVOX) tablet 50 mg  50 mg Oral QAC lunch Mariel Craft, MD      . haloperidol (HALDOL) tablet 5 mg  5 mg Oral QAC lunch Mariel Craft, MD   5 mg at 03/13/18 1203  . haloperidol decanoate (HALDOL DECANOATE) 100 MG/ML injection 100 mg  100 mg Intramuscular Q28 days Mariel Craft, MD      . hydrOXYzine (ATARAX/VISTARIL) tablet 25 mg  25 mg Oral TID PRN Mariel Craft, MD      . magnesium hydroxide (MILK OF MAGNESIA) suspension 30 mL  30 mL Oral Daily PRN Money, Gerlene Burdock, FNP      . metoprolol succinate (TOPROL-XL) 24 hr tablet 25 mg  25 mg Oral QAC lunch Mariel Craft, MD   25 mg at 03/13/18 1203   PTA Medications: Medications Prior to Admission  Medication Sig Dispense Refill Last Dose  . hydrOXYzine (ATARAX/VISTARIL) 25 MG tablet Take 1 tablet (25 mg total) by mouth 3 (three) times daily as needed for anxiety. 60 tablet 0 Past Month at  Unknown time  . OLANZapine (ZYPREXA) 10 MG tablet Take 1 tablet (10 mg total) by mouth at bedtime. For mood control 30 tablet 0 Past Month at Unknown time  . OLANZapine (ZYPREXA) 5 MG tablet Take 1 tablet (5 mg total) by mouth daily. For agitation/mood control 30 tablet 0 Past Month at Unknown time  . LORazepam (ATIVAN) 1 MG tablet Take 1 tablet (1 mg total) by mouth 3 (three) times daily as needed for anxiety. 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   Physician Treatment Plan for Primary Diagnosis: Schizoaffective Disorder  Medication Management: Evaluate patient's response, side effects, and tolerance of medication regimen.  Therapeutic Interventions: 1 to 1 sessions, Unit Group sessions and Medication administration.  Evaluation of Outcomes: Adequate for discharge   Physician Treatment Plan for Secondary Diagnosis: Principal Problem:   Bipolar disorder, current episode manic severe with psychotic features (HCC) Active Problems:   Schizoaffective disorder (HCC)  Medication Management: Evaluate patient's response, side effects, and tolerance of medication regimen.  Therapeutic Interventions: 1 to 1 sessions, Unit Group sessions and Medication administration.  Evaluation of Outcomes: Adequate for discharge  RN Treatment Plan for Primary Diagnosis: Schizoaffective Disorder  Long Term Goal(s): Knowledge of disease and therapeutic regimen to maintain health will improve  Short Term Goals: Ability to remain free from injury will improve, Ability to verbalize frustration and anger appropriately will improve, Ability to demonstrate self-control and Ability to participate in decision making will improve  Medication Management: RN  will administer medications as ordered by provider, will assess and evaluate patient's response and provide education to patient for prescribed medication. RN will report any adverse and/or side effects to prescribing provider.  Therapeutic Interventions: 1 on 1 counseling sessions, Psychoeducation, Medication administration, Evaluate responses to treatment, Monitor vital signs and CBGs as ordered, Perform/monitor CIWA, COWS, AIMS and Fall Risk screenings as ordered, Perform wound care treatments as ordered.  Evaluation of Outcomes: Adequate for discharge   LCSW Treatment Plan for Primary Diagnosis: Schizoaffective Disorder  Long Term Goal(s): Safe transition to appropriate next level of care at discharge, Engage patient in therapeutic group addressing interpersonal concerns.  Short Term Goals: Engage patient in aftercare planning with referrals and resources, Increase social support, Increase emotional regulation and Facilitate acceptance of mental health diagnosis and concerns  Therapeutic Interventions: Assess for all discharge needs, 1 to 1 time with Social worker, Explore available resources and support systems, Assess for adequacy in community support network, Educate family and significant other(s) on suicide prevention, Complete Psychosocial Assessment, Interpersonal group therapy.  Evaluation of Outcomes: Adequate for discharge   Progress in Treatment: Attending groups: Yes Participating in groups: Yes Taking medication as prescribed: Yes. Toleration medication: Yes. Family/Significant other contact made: SPE completed with pt's mother; collateral information also obtained.  Patient understands diagnosis: Yes. Discussing patient identified problems/goals with staff: Yes. Medical problems stabilized or resolved: Yes. Denies suicidal/homicidal ideation: Yes. Issues/concerns per patient self-inventory: No. Other: n/a   New problem(s) identified: No, Describe:  n/a  New Short  Term/Long Term Goal(s): elimination of AH, medication management for mood stabilization; elimination of SI thoughts; development of comprehensive mental wellness/sobriety plan.   Patient Goals:  To be able to not hear voices and go to groups.   Discharge Plan or Barriers: [facility] and has been [facility]day center). She has assessment on 8/8. MHAG pamphlet, Mobile Crisis information provided to patient for additional community support and resources.   Reason for Continuation of Hospitalization: none  Estimated Length of Stay: Friday, 03/14/18  Attendees: Patient: 03/14/2018 9:41 AM  Physician: Dr. Viviano SimasMaurer MD 03/14/2018 9:41 AM  Nursing: Meriam SpragueBeverly RN 03/14/2018 9:41 AM  RN Care Manager: x 03/14/2018 9:41 AM  Social Worker: Corrie MckusickHeather Areya Lemmerman LCSW 03/14/2018 9:41 AM  Recreational Therapist: x 03/14/2018 9:41 AM  Other: Armandina StammerAgnes Nwoko NP; Gilda Creaseakia Starke NP 03/14/2018 9:41 AM  Other:  03/14/2018 9:41 AM  Other: 03/14/2018 9:41 AM    Scribe for Treatment Team: Rona RavensHeather S Ovella Manygoats, LCSW 03/14/2018 9:41 AM

## 2018-03-14 NOTE — Progress Notes (Signed)
Adult Psychoeducational Group Note  Date:  03/14/2018 Time:  8:54 AM  Group Topic/Focus:  Goals Group:   The focus of this group is to help patients establish daily goals to achieve during treatment and discuss how the patient can incorporate goal setting into their daily lives to aide in recovery.  Participation Level:  Active  Participation Quality:  Appropriate and Sharing  Affect:  Appropriate  Cognitive:  Appropriate  Insight: Appropriate  Engagement in Group:  Engaged  Modes of Intervention:  Discussion  Additional Comments:  Pt has a goal of working on ways to set limits with people as well as boundaries.   Deforest Hoyleslexandre J Regina Ganci 03/14/2018, 8:54 AM

## 2018-03-14 NOTE — Progress Notes (Signed)
Patient discharged to lobby. Patient was stable and appreciative at that time. All papers and prescriptions were given and valuables returned. Verbal understanding expressed. Denies SI/HI and A/VH. Patient given opportunity to express concerns and ask questions.  
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
## 2018-03-14 NOTE — Progress Notes (Signed)
Recreation Therapy Notes  INPATIENT RECREATION TR PLAN  Patient Details Name: [name] MRN: [id number] DOB: Dec 19, 1995 Today's Date: 03/14/2018  Rec Therapy Plan Is patient appropriate for Therapeutic Recreation?: Yes Treatment times per week: about 3 days Estimated Length of Stay: 5-7 days TR Treatment/Interventions: Group participation (Comment)  Discharge Criteria Pt will be discharged from therapy if:: Discharged Treatment plan/goals/alternatives discussed and agreed upon by:: Patient/family  Discharge Summary Short term goals set: See patient care plan Short term goals met: Complete Progress toward goals comments: Groups attended Which groups?: Coping skills, Communication, Other (Comment)(Anxiety) Reason goals not met: None Therapeutic equipment acquired: N/A Reason patient discharged from therapy: Discharge from hospital Pt/family agrees with progress & goals achieved: Yes Date patient discharged from therapy: 03/14/18   Victorino Sparrow, LRT/CTRS   Ria Comment, [place] A 03/14/2018, 1:32 PM
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
## 2018-03-17 ENCOUNTER — Other Ambulatory Visit: Payer: Self-pay | Admitting: Family Medicine

## 2018-03-17 ENCOUNTER — Telehealth: Payer: Self-pay

## 2018-03-17 DIAGNOSIS — R569 Unspecified convulsions: Secondary | ICD-10-CM

## 2018-03-17 NOTE — Telephone Encounter (Signed)
Follow up call made to patient. Mother called [facility] on 03/16/18. States patient had been shaking all over. Said she had had 11 shaking spells in 3-5 seconds. States patient does not respond when she starts shaking ,looks straight ahead. Mother states patient takes Ativan Cogentin and Seroquel. States they did not go to ED because patient has been before when she has had this spell and not lab work is done.   Mother states patient has had 1 episode today but patient is just tired at this time. Patient was seen by Psychiatrist on Saturday and her Haldol was stopped. Mother states she would like a referral to Dr. Allena KatzPatel ( female Neurologist)  No chest pain or SOB at this time. Please advise.

## 2018-03-17 NOTE — Telephone Encounter (Signed)
Referral put in.

## 2018-03-18 ENCOUNTER — Encounter: Payer: Self-pay | Admitting: Neurology

## 2018-03-22 ENCOUNTER — Ambulatory Visit (HOSPITAL_COMMUNITY)
Admission: RE | Admit: 2018-03-22 | Discharge: 2018-03-22 | Disposition: A | Discharge status: Home / Self Care | Payer: BC Managed Care – PPO | Source: Ambulatory Visit | Attending: Family Medicine | Admitting: Family Medicine

## 2018-03-22 DIAGNOSIS — G4441 Drug-induced headache, not elsewhere classified, intractable: Secondary | ICD-10-CM

## 2018-03-25 ENCOUNTER — Telehealth: Payer: Self-pay | Admitting: *Deleted

## 2018-03-25 NOTE — Telephone Encounter (Signed)
Can you please review MRI?

## 2018-03-25 NOTE — Telephone Encounter (Signed)
The mri report summary basically states normal mri of head.

## 2018-03-25 NOTE — Telephone Encounter (Signed)
Patient notified of results.

## 2018-03-25 NOTE — Telephone Encounter (Signed)
Copied from CRM [phone]. Topic: Quick Communication - Other Results >> Mar 25, 2018  7:07 AM Crist InfanteHarrald, Kathy J wrote: Pt calling for results of MRI done 03/22/18

## 2018-04-03 ENCOUNTER — Ambulatory Visit: Payer: BC Managed Care – PPO | Admitting: Family Medicine

## 2018-04-03 ENCOUNTER — Other Ambulatory Visit (HOSPITAL_COMMUNITY)
Admission: RE | Admit: 2018-04-03 | Discharge: 2018-04-03 | Disposition: A | Discharge status: Home / Self Care | Payer: BC Managed Care – PPO | Source: Ambulatory Visit | Attending: Family Medicine | Admitting: Family Medicine

## 2018-04-03 ENCOUNTER — Encounter: Payer: Self-pay | Admitting: Family Medicine

## 2018-04-03 VITALS — BP 112/63 | HR 93 | Temp 98.5°F | Resp 16 | Ht 64.0 in | Wt 154.4 lb

## 2018-04-03 DIAGNOSIS — K5900 Constipation, unspecified: Secondary | ICD-10-CM | POA: Insufficient documentation

## 2018-04-03 DIAGNOSIS — K5903 Drug induced constipation: Secondary | ICD-10-CM | POA: Diagnosis not present

## 2018-04-03 DIAGNOSIS — Z202 Contact with and (suspected) exposure to infections with a predominantly sexual mode of transmission: Secondary | ICD-10-CM | POA: Diagnosis not present

## 2018-04-03 HISTORY — DX: Constipation, unspecified: K59.00

## 2018-04-03 LAB — POC URINALSYSI DIPSTICK (AUTOMATED)
Bilirubin, UA: NEGATIVE
Blood, UA: NEGATIVE
GLUCOSE UA: NEGATIVE
Ketones, UA: NEGATIVE
LEUKOCYTES UA: NEGATIVE
NITRITE UA: NEGATIVE
Protein, UA: NEGATIVE
Spec Grav, UA: 1.02 (ref 1.010–1.025)
UROBILINOGEN UA: 0.2 U/dL
pH, UA: 6 (ref 5.0–8.0)

## 2018-04-03 LAB — POCT URINE PREGNANCY: PREG TEST UR: NEGATIVE
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
## 2018-04-03 NOTE — Patient Instructions (Signed)

## 2018-04-03 NOTE — Assessment & Plan Note (Signed)
Colace and miralax daily F/u psych-- to see if meds can be changed or cont colace and miralax

## 2018-04-04 LAB — HIV ANTIBODY (ROUTINE TESTING W REFLEX): HIV 1&2 Ab, 4th Generation: NONREACTIVE

## 2018-04-04 LAB — RPR: RPR: NONREACTIVE

## 2018-04-04 LAB — URINE CYTOLOGY ANCILLARY ONLY
Bacterial vaginitis: NEGATIVE
Candida vaginitis: NEGATIVE
Chlamydia: NEGATIVE
Neisseria Gonorrhea: NEGATIVE
Trichomonas: NEGATIVE

## 2018-04-05 ENCOUNTER — Encounter: Payer: Self-pay | Admitting: Family Medicine

## 2018-04-07 ENCOUNTER — Encounter

## 2018-04-07 ENCOUNTER — Ambulatory Visit: Payer: Self-pay | Admitting: Neurology

## 2018-04-18 ENCOUNTER — Encounter: Payer: Self-pay | Admitting: Neurology

## 2018-04-18 ENCOUNTER — Ambulatory Visit: Payer: BC Managed Care – PPO | Admitting: Neurology

## 2018-04-18 VITALS — BP 118/70 | HR 102 | Ht 64.0 in | Wt 150.4 lb

## 2018-04-18 DIAGNOSIS — R443 Hallucinations, unspecified: Secondary | ICD-10-CM

## 2018-04-18 DIAGNOSIS — F688 Other specified disorders of adult personality and behavior: Secondary | ICD-10-CM | POA: Diagnosis not present

## 2018-04-18 DIAGNOSIS — R251 Tremor, unspecified: Secondary | ICD-10-CM

## 2018-04-18 NOTE — Patient Instructions (Signed)
1. Schedule 1-hour EEG 2. I will speak to Dr. [name] and we will go from there if further testing is needed 3. Follow-up in 6 months or so, call for any changes
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
## 2018-04-30 ENCOUNTER — Ambulatory Visit (INDEPENDENT_AMBULATORY_CARE_PROVIDER_SITE_OTHER): Payer: BC Managed Care – PPO | Admitting: Neurology

## 2018-04-30 DIAGNOSIS — F688 Other specified disorders of adult personality and behavior: Secondary | ICD-10-CM

## 2018-04-30 DIAGNOSIS — R251 Tremor, unspecified: Secondary | ICD-10-CM

## 2018-04-30 DIAGNOSIS — R443 Hallucinations, unspecified: Secondary | ICD-10-CM

## 2018-05-06 ENCOUNTER — Telehealth: Payer: Self-pay

## 2018-05-06 NOTE — Procedures (Signed)
ELECTROENCEPHALOGRAM REPORT  Date of Study: 04/30/2018  Patient's Name: Trula SladeShekinah Y Dorminey MRN: [id number] Date of Birth: 11-02-1995  Referring Provider: Dr. Patrcia DollyKaren Britain Anagnos  Clinical History: This is a 22 year old woman with bipolar disorder, hallucinations, personality changes, and head shaking/arm jerking episodes.  Medications: COGENTIN 1 MG tablet  VRAYLAR capsule  ATIVAN 1 MG tablet  TOPROL-XL 25 MG 24 hr tablet  SEROQUEL 100 MG tablet   SPRINTEC 0.25-35 MG-MCG tablet VALTREX 1000 MG tablet   Technical Summary: A multichannel digital 1-hour EEG recording measured by the international 10-20 system with electrodes applied with paste and impedances below 5000 ohms performed in our laboratory with EKG monitoring in an awake and asleep patient.  Hyperventilation and photic stimulation were performed.  The digital EEG was referentially recorded, reformatted, and digitally filtered in a variety of bipolar and referential montages for optimal display.    Description: The patient is awake and asleep during the recording.  During maximal wakefulness, there is a symmetric, medium voltage 10 Hz posterior dominant rhythm that attenuates with eye opening.  The record is symmetric.  During drowsiness and sleep, there is an increase in theta slowing of the background, with shifting asymmetry seen over the bilateral temporal regions.  Vertex waves and symmetric sleep spindles were seen.  Hyperventilation and photic stimulation did not elicit any abnormalities. She is noted to have brief head jerks and head shaking with no epileptiform correlate seen. There were no epileptiform discharges or electrographic seizures seen.    EKG lead was unremarkable.  Impression: This 1-hour awake and asleep EEG is within normal limits.  Clinical Correlation: A normal EEG does not exclude a clinical diagnosis of epilepsy. Episodes of head jerks and head shaking did not show any epileptiform correlate.  If further  clinical questions remain, prolonged EEG may be helpful.  Clinical correlation is advised.   Patrcia DollyKaren Sephora Boyar, M.D.

## 2018-05-06 NOTE — Telephone Encounter (Signed)
Spoke with pt relaying message below.   

## 2018-05-06 NOTE — Telephone Encounter (Signed)
-----   Message from Van ClinesKaren M Aquino, MD sent at 05/06/2018  1:42 PM EDT ----- Pls let patient/mother know the brain wave test was normal, no evidence of any brain abnormality. At this point would continue working with Dr. Evelene CroonKaur, follow-up as scheduled. Thanks

## 2018-05-13 DIAGNOSIS — H5213 Myopia, bilateral: Secondary | ICD-10-CM | POA: Diagnosis not present

## 2018-05-13 DIAGNOSIS — H52223 Regular astigmatism, bilateral: Secondary | ICD-10-CM | POA: Diagnosis not present

## 2018-05-21 DIAGNOSIS — F4323 Adjustment disorder with mixed anxiety and depressed mood: Secondary | ICD-10-CM | POA: Diagnosis not present

## 2018-05-22 DIAGNOSIS — F410 Panic disorder [episodic paroxysmal anxiety] without agoraphobia: Secondary | ICD-10-CM | POA: Diagnosis not present

## 2018-05-22 DIAGNOSIS — F900 Attention-deficit hyperactivity disorder, predominantly inattentive type: Secondary | ICD-10-CM | POA: Diagnosis not present

## 2018-05-22 DIAGNOSIS — F22 Delusional disorders: Secondary | ICD-10-CM | POA: Diagnosis not present

## 2018-05-27 ENCOUNTER — Other Ambulatory Visit: Payer: Self-pay | Admitting: Family Medicine

## 2018-06-03 DIAGNOSIS — F22 Delusional disorders: Secondary | ICD-10-CM | POA: Diagnosis not present

## 2018-06-03 DIAGNOSIS — F23 Brief psychotic disorder: Secondary | ICD-10-CM | POA: Diagnosis not present

## 2018-06-03 MED FILL — QUETIAPINE 300 MG TABLET: 300 | 30 days supply | Qty: 60 | Fill #0

## 2018-06-09 ENCOUNTER — Encounter (HOSPITAL_COMMUNITY): Payer: Self-pay | Admitting: *Deleted

## 2018-06-09 ENCOUNTER — Emergency Department (HOSPITAL_COMMUNITY)
Admission: EM | Admit: 2018-06-09 | Discharge: 2018-06-09 | Disposition: A | Discharge status: Home / Self Care | Payer: 59 | Attending: Emergency Medicine | Admitting: Emergency Medicine

## 2018-06-09 ENCOUNTER — Other Ambulatory Visit: Payer: Self-pay | Admitting: Family Medicine

## 2018-06-09 DIAGNOSIS — Z79899 Other long term (current) drug therapy: Secondary | ICD-10-CM | POA: Insufficient documentation

## 2018-06-09 DIAGNOSIS — G4489 Other headache syndrome: Secondary | ICD-10-CM | POA: Diagnosis not present

## 2018-06-09 DIAGNOSIS — F13239 Sedative, hypnotic or anxiolytic dependence with withdrawal, unspecified: Secondary | ICD-10-CM | POA: Insufficient documentation

## 2018-06-09 DIAGNOSIS — R569 Unspecified convulsions: Secondary | ICD-10-CM

## 2018-06-09 DIAGNOSIS — R52 Pain, unspecified: Secondary | ICD-10-CM | POA: Diagnosis not present

## 2018-06-09 DIAGNOSIS — R000 Tachycardia, unspecified: Secondary | ICD-10-CM | POA: Diagnosis not present

## 2018-06-09 DIAGNOSIS — F1399 Sedative, hypnotic or anxiolytic use, unspecified with unspecified sedative, hypnotic or anxiolytic-induced disorder: Secondary | ICD-10-CM | POA: Diagnosis present

## 2018-06-09 DIAGNOSIS — F13939 Sedative, hypnotic or anxiolytic use, unspecified with withdrawal, unspecified: Secondary | ICD-10-CM

## 2018-06-09 DIAGNOSIS — F29 Unspecified psychosis not due to a substance or known physiological condition: Secondary | ICD-10-CM | POA: Diagnosis not present

## 2018-06-09 LAB — BASIC METABOLIC PANEL
ANION GAP: 7 (ref 5–15)
BUN: 14 mg/dL (ref 6–20)
CHLORIDE: 108 mmol/L (ref 98–111)
CO2: 25 mmol/L (ref 22–32)
Calcium: 9.3 mg/dL (ref 8.9–10.3)
Creatinine, Ser: 0.78 mg/dL (ref 0.44–1.00)
GFR calc non Af Amer: >60 mL/min (ref >60)
Glucose, Bld: 156 mg/dL — ABNORMAL HIGH (ref 70–99)
Potassium: 4.1 mmol/L (ref 3.5–5.1)
Sodium: 140 mmol/L (ref 135–145)

## 2018-06-09 LAB — I-STAT BETA HCG BLOOD, ED (MC, WL, AP ONLY): I-stat hCG, quantitative: <5 m[IU]/mL (ref <5)

## 2018-06-09 LAB — CBC
HEMATOCRIT: 41 % (ref 36.0–46.0)
Hemoglobin: 13.3 g/dL (ref 12.0–15.0)
MCH: 29 pg (ref 26.0–34.0)
MCHC: 32.4 g/dL (ref 30.0–36.0)
MCV: 89.3 fL (ref 80.0–100.0)
NRBC: 0 % (ref 0.0–0.2)
PLATELETS: 265 10*3/uL (ref 150–400)
RBC: 4.59 MIL/uL (ref 3.87–5.11)
RDW: 13.3 % (ref 11.5–15.5)
WBC: 5.5 10*3/uL (ref 4.0–10.5)

## 2018-06-09 MED ORDER — LORAZEPAM 1 MG PO TABS
0.5000 mg | ORAL_TABLET | Freq: Once | ORAL | Status: AC
  Administered 2018-06-09: 0.5 mg via ORAL
  Filled 2018-06-09: qty 1

## 2018-06-09 MED ORDER — ACETAMINOPHEN 325 MG PO TABS
650.0000 mg | ORAL_TABLET | Freq: Once | ORAL | Status: AC
  Administered 2018-06-09: 650 mg via ORAL
  Filled 2018-06-09: qty 2

## 2018-06-09 NOTE — Discharge Instructions (Signed)
Please read and follow all provided instructions.  Your diagnoses today include:  1. Seizure (HCC)   2. Benzodiazepine withdrawal with complication (HCC)     Tests performed today include:  Blood counts and electrolytes - no problems  Vital signs. See below for your results today.   Medications prescribed:   None  Take any prescribed medications only as directed.  Home care instructions:  Follow any educational materials contained in this packet.  Take 0.5 mg of Ativan once a day for the next 5 days.  Follow-up with your psychiatrist as planned for additional instructions.  Follow-up instructions: Follow-up with your psychiatrist as planned for additional instructions.  See your primary care doctor to help direct follow-up care.  Return instructions:   Please return to the Emergency Department if you experience worsening symptoms.   Return with altered mental status, fever, confusion, or additional seizures.  Please return if you have any other emergent concerns.  Additional Information:  Your vital signs today were: BP 111/80    Pulse 95    Temp 98.1 F (36.7 C) (Oral)    Resp 17    SpO2 100%  If your blood pressure (BP) was elevated above 135/85 this visit, please have this repeated by your doctor within one month. --------------

## 2018-06-09 NOTE — ED Triage Notes (Signed)
Pt in via EMS to triage, pt was at home with family and turned her head up to the right and had a full body seizure, no history of same, pt took approx 5 minutes to recover from episode with family, alert and oriented, in the last year patient has developed new paranoid behaviors and has been seeing a psychiatrist for this, possibly an increase in Seroquel recently

## 2018-06-09 NOTE — ED Provider Notes (Signed)
[facility] EMERGENCY DEPARTMENT Provider Note   CSN: [id number] Arrival date & time: 06/09/18  1358     History   Chief Complaint Chief Complaint  Patient presents with  . Seizures    HPI [name] is a 22 y.o. female.  Patient with history of schizoaffective disorder currently on Seroquel presents to the emergency department after having a witnessed seizure episode today.  Mother is a Engineer, civil (consulting).  Patient was sitting at a table getting ready to have lunch when she suddenly fell backwards.  She had several minutes of full body shaking with gaze deviation.  Patient was postictal afterwards.  It took approximately 30 minutes for her to return to baseline.  EMS was called.  Patient is never had a seizure before.  Patient does not like taking lorazepam because it makes her feel suicidal.  Patient had been placed on this in August 2019 and had been taking it 2-3 times a day until yesterday when she only had one dose.  She decides to stop it did not take any benzodiazepine medications today.  Patient also drank alcohol during 2 days over this past weekend (today is Monday) while at a wedding.  Patient does not typically drink daily.  She denies use of pain medication such as tramadol.  She denies any fevers or neck pain/stiffness.  Patient has returned to her mental baseline and is not confused.  She did not hit her head.  The onset of this condition was acute. The course is resolved. Aggravating factors: none. Alleviating factors: none.       Past Medical History:  Diagnosis Date  . ADHD (attention deficit hyperactivity disorder)   . Allergy   . Anxiety   . Bipolar disorder (HCC)   . Eczema 12/11/13    Patient Active Problem List   Diagnosis Date Noted  . Constipation 04/03/2018  . Schizoaffective disorder (HCC) 03/08/2018  . Bipolar disorder, current episode manic severe with psychotic features (HCC)   . MDD (major depressive disorder), recurrent severe,  without psychosis (HCC) 02/11/2018  . Pelvic pain 01/31/2018  . Genital herpes simplex 10/24/2017  . Anxiety 08/30/2017  . Severe bipolar I disorder, current or most recent episode depressed (HCC) 03/30/2017  . Syncope 03/03/2017  . STD exposure 05/13/2015  . Preventative health care 03/16/2013  . Bipolar disorder (HCC) 12/30/2012  . ADD (attention deficit disorder) 04/24/2011    History reviewed. No pertinent surgical history.   OB History   None      Home Medications    Prior to Admission medications   Medication Sig Start Date End Date Taking? Authorizing Provider  amantadine (SYMMETREL) 100 MG capsule Take 100 mg by mouth 2 (two) times daily. 04/04/18   [provider]  LORazepam (ATIVAN) 1 MG tablet Take 1 tablet by mouth 3 (three) times daily. 03/15/18   [provider]  metoprolol succinate (TOPROL-XL) 25 MG 24 hr tablet Take 1 tablet (25 mg total) by mouth daily before lunch. For high blood pressure 03/14/18   Armandina Stammer I, NP  norgestimate-ethinyl estradiol (SPRINTEC 28) 0.25-35 MG-MCG tablet Take 1 tablet by mouth daily. 05/29/18   [name], DO  QUEtiapine (SEROQUEL) 100 MG tablet Take 200 mg by mouth at bedtime.  03/15/18   [provider]  valACYclovir (VALTREX) 1000 MG tablet Take 1,000 mg by mouth daily. 03/27/18   [provider]    Family History Family History  Problem Relation Age of Onset  .  Asthma Mother   . Stroke Father   . Heart disease Father   . Asthma Father   . Hypertension Father        pulmonary hypertension  . Heart disease Maternal Uncle   . Hypertension Maternal Grandmother   . COPD Maternal Grandmother   . Diabetes Maternal Grandfather   . Stroke Paternal Grandmother   . Diabetes Paternal Grandmother   . Diabetes Paternal Grandfather   . Leukemia Maternal Uncle     Social History Social History   Tobacco Use  . Smoking status: Never Smoker  . Smokeless tobacco: Never Used  Substance  Use Topics  . Alcohol use: Yes    Comment: occasionally  . Drug use: No     Allergies   Concerta [methylphenidate]; Terbinafine; Clindamycin hcl; and Terbinafine hcl   Review of Systems Review of Systems  Constitutional: Negative for fever.  HENT: Negative for rhinorrhea and sore throat.   Eyes: Negative for redness.  Respiratory: Negative for cough.   Cardiovascular: Negative for chest pain.  Gastrointestinal: Negative for abdominal pain, diarrhea, nausea and vomiting.  Genitourinary: Negative for dysuria.  Musculoskeletal: Negative for myalgias.  Skin: Negative for rash.  Neurological: Positive for seizures and headaches.     Physical Exam Updated Vital Signs BP 111/80   Pulse 95   Temp 98.1 F (36.7 C) (Oral)   Resp 17   SpO2 100%   Physical Exam  Constitutional: She is oriented to person, place, and time. She appears well-developed and well-nourished.  HENT:  Head: Normocephalic and atraumatic.  Right Ear: Tympanic membrane, external ear and ear canal normal.  Left Ear: Tympanic membrane, external ear and ear canal normal.  Nose: Nose normal.  Mouth/Throat: Uvula is midline, oropharynx is clear and moist and mucous membranes are normal.  Eyes: Pupils are equal, round, and reactive to light. Conjunctivae, EOM and lids are normal. Right eye exhibits no nystagmus. Left eye exhibits no nystagmus.  Neck: Normal range of motion. Neck supple.  Cardiovascular: Normal rate and regular rhythm.  Pulmonary/Chest: Effort normal and breath sounds normal.  Abdominal: Soft. There is no tenderness.  Musculoskeletal:       Cervical back: She exhibits normal range of motion, no tenderness and no bony tenderness.  Neurological: She is alert and oriented to person, place, and time. She has normal strength and normal reflexes. No cranial nerve deficit or sensory deficit. She displays a negative Romberg sign. Coordination and gait normal. GCS eye subscore is 4. GCS verbal subscore is  5. GCS motor subscore is 6.  Skin: Skin is warm and dry.  Psychiatric: She has a normal mood and affect.  Nursing note and vitals reviewed.    ED Treatments / Results  Labs (all labs ordered are listed, but only abnormal results are displayed) Labs Reviewed  BASIC METABOLIC PANEL - Abnormal; Notable for the following components:      Result Value   Glucose, Bld 156 (*)    All other components within normal limits  CBC  CBG MONITORING, ED  I-STAT BETA HCG BLOOD, ED ([facility], [facility], [facility] ONLY)    EKG None  Radiology No results found.  Procedures Procedures (including critical care time)  Medications Ordered in ED Medications  LORazepam (ATIVAN) tablet 0.5 mg (0.5 mg Oral Given 06/09/18 1843)  acetaminophen (TYLENOL) tablet 650 mg (650 mg Oral Given 06/09/18 1843)     Initial Impression / Assessment and Plan / ED Course  I have reviewed the triage vital signs and  the nursing notes.  Pertinent labs & imaging results that were available during my care of the patient were reviewed by me and considered in my medical decision making (see chart for details).     Patient seen and examined.  Reviewed with mother and patient at bedside.  Most likely etiology of her isolated seizure today was benzodiazepine withdrawal.  She does not have any signs of meningitis or encephalitis.  No signs or recent history of head injury.  Current plan is to have patient take 0.5 mg of Ativan for the remainder of the week.  She is following up with her psychiatrist in 2 days.  They can make adjustments to this plan at that time.  Vital signs reviewed and are as follows: BP 111/80   Pulse 95   Temp 98.1 F (36.7 C) (Oral)   Resp 17   SpO2 100%   Discussed risks and benefits of Lorazepam given the weight makes the patient feel.  She is agreeable to taking 0.5 mg once a day.  She will be monitored for any suicidality or worsening symptoms by her family who are very reliable.  She has an appointment in 2  days with her psychiatrist.  We discussed return to the emergency department with any additional seizure activity, confusion, severe headache, fevers.  Final Clinical Impressions(s) / ED Diagnoses   Final diagnoses:  Seizure (HCC)  Benzodiazepine withdrawal with complication [facility])   Patient with isolated witnessed seizure today.  Patient had been on Ativan 2-3 times a day for the past 2 months and abruptly stopped with 1 dose yesterday and 0 today.  This is likely the etiology of her seizure.  She is back to her mental status baseline and has no neurological findings on exam.  No electrolyte imbalance or other etiology noted on exam and work-up.  Patient has good follow-up and is willing to return with additional seizures or other problems.  Patient does not drive.   ED Discharge Orders    None       Renne Crigler, Cordelia Poche 06/09/18 1945    Mancel Bale, MD 06/10/18 [phone]

## 2018-06-09 NOTE — ED Triage Notes (Signed)
Pt reports she took her first dose of 600mg  Seroquel this am, had previously been taking 100mg 

## 2018-06-09 NOTE — ED Notes (Addendum)
While checking vitals in lobby, the patient stated that she had not taken her normal daily medications today.

## 2018-06-11 ENCOUNTER — Telehealth: Payer: Self-pay

## 2018-06-11 ENCOUNTER — Telehealth: Payer: Self-pay | Admitting: Neurology

## 2018-06-11 DIAGNOSIS — F251 Schizoaffective disorder, depressive type: Secondary | ICD-10-CM | POA: Diagnosis not present

## 2018-06-11 DIAGNOSIS — R443 Hallucinations, unspecified: Secondary | ICD-10-CM

## 2018-06-11 DIAGNOSIS — R55 Syncope and collapse: Secondary | ICD-10-CM

## 2018-06-11 DIAGNOSIS — F4010 Social phobia, unspecified: Secondary | ICD-10-CM | POA: Diagnosis not present

## 2018-06-11 DIAGNOSIS — F23 Brief psychotic disorder: Secondary | ICD-10-CM | POA: Diagnosis not present

## 2018-06-11 DIAGNOSIS — R251 Tremor, unspecified: Secondary | ICD-10-CM

## 2018-06-11 MED FILL — CHLORDIAZEPOXIDE 10 MG CAP: 10 | 90 days supply | Qty: 360 | Fill #0

## 2018-06-11 MED FILL — QUETIAPINE FUMARATE 400 MG: 400 | 90 days supply | Qty: 180 | Fill #0

## 2018-06-11 NOTE — Telephone Encounter (Signed)
Pls request Dr. Carie Caddy note for review. Thanks

## 2018-06-11 NOTE — Telephone Encounter (Signed)
Patient's mom is calling in wanting to get pt in here sooner. Dr. Lafayette Dragon (psychiatrist) told her that she needs to be seen here because she has had a seizure. Dr.Carr thinks she needs to be on some seizure medication. Does she need to be seen or can Dr.Aquino write a prescription? Please call her back [phone]. Thanks!

## 2018-06-11 NOTE — Telephone Encounter (Signed)
Request for notes faxed to [phone]

## 2018-06-11 NOTE — Telephone Encounter (Signed)
Called patient to schedule an ED follow up. Patient states she was seen by her psychiatrist today and doesn't feel like she needs appointment with provider right now. Advised to call office if she needs to.

## 2018-06-11 NOTE — Telephone Encounter (Signed)
Dr. Carie Caddy office called back.  Dr. [name] would like pt to be seen sooner than scheduled appointment.  Non-urgent, but within the next 3-4 weeks.  pls advise.

## 2018-06-11 NOTE — Telephone Encounter (Signed)
Spoke with Dr Carie Caddy office for verification of needing sooner appointment.  ED notes state that etiology of recent seizure is most likely due to alcohol intake combined with abruptly stopping Ativan.  Will return call to pt's mother in regards to appointment once I have verification from Dr. Magdalen Spatz office.

## 2018-06-12 DIAGNOSIS — R000 Tachycardia, unspecified: Secondary | ICD-10-CM | POA: Diagnosis not present

## 2018-06-12 MED FILL — PREVIFEM 0.25-35 MG-MCG TAB: 0.25-35 | 84 days supply | Qty: 84 | Fill #0

## 2018-06-12 NOTE — Telephone Encounter (Addendum)
Orders placed Staff message sent to Ala Bent, EEG Tech

## 2018-06-12 NOTE — Telephone Encounter (Signed)
Verbal per Dr. Karel Jarvis - pt should have 48HR Ambulatory EEG  Called pt's mother.  No answer.Marland Kitchen  LMOM advising that Dr. Karel Jarvis would like pt to have EEG.  Advised that she will get a call from EEG [employer], Darl Pikes, to schedule.

## 2018-06-12 NOTE — Addendum Note (Signed)
Addended by: Horatio Pel on: 06/12/2018 11:53 AM   Modules accepted: Orders

## 2018-06-13 ENCOUNTER — Telehealth: Payer: Self-pay

## 2018-06-13 NOTE — Telephone Encounter (Signed)
Patients mother returned my call. states on 06/12/18 after she called [facility] regarding patients BP fluctuation,Paramedics came to their home and  Evaluated patient. States Metoprolol was given and BP was back to normal. Appointment scheduled with Dr. Zola Button for 06/16/18

## 2018-06-13 NOTE — Telephone Encounter (Signed)
[facility] follow up call made to patient to see if she went to ED as suggested. No answer, . Left message for patient to call back. Advised to call back so that we may triage ans see her current status since yesterday.

## 2018-06-16 ENCOUNTER — Ambulatory Visit (INDEPENDENT_AMBULATORY_CARE_PROVIDER_SITE_OTHER): Payer: 59 | Admitting: Family Medicine

## 2018-06-16 ENCOUNTER — Encounter: Payer: Self-pay | Admitting: Family Medicine

## 2018-06-16 VITALS — BP 119/75 | HR 85 | Temp 98.8°F | Resp 16 | Ht 64.0 in | Wt 160.4 lb

## 2018-06-16 DIAGNOSIS — R002 Palpitations: Secondary | ICD-10-CM

## 2018-06-16 DIAGNOSIS — F319 Bipolar disorder, unspecified: Secondary | ICD-10-CM | POA: Diagnosis not present

## 2018-06-16 NOTE — Patient Instructions (Signed)
Palpitations A palpitation is the feeling that your heartbeat is irregular or is faster than normal. It may feel like your heart is fluttering or skipping a beat. Palpitations are usually not a serious problem. They may be caused by many things, including smoking, caffeine, alcohol, stress, and certain medicines. Although most causes of palpitations are not serious, palpitations can be a sign of a serious medical problem. In some cases, you may need further medical evaluation. Follow these instructions at home: Pay attention to any changes in your symptoms. Take these actions to help with your condition:  Avoid the following: ? Caffeinated coffee, tea, soft drinks, diet pills, and energy drinks. ? Chocolate. ? Alcohol.  Do not use any tobacco products, such as cigarettes, chewing tobacco, and e-cigarettes. If you need help quitting, ask your health care provider.  Try to reduce your stress and anxiety. Things that can help you relax include: ? Yoga. ? Meditation. ? Physical activity, such as swimming, jogging, or walking. ? Biofeedback. This is a method that helps you learn to use your mind to control things in your body, such as your heartbeats.  Get plenty of rest and sleep.  Take over-the-counter and prescription medicines only as told by your health care provider.  Keep all follow-up visits as told by your health care provider. This is important.  Contact a health care provider if:  You continue to have a fast or irregular heartbeat after 24 hours.  Your palpitations occur more often. Get help right away if:  You have chest pain or shortness of breath.  You have a severe headache.  You feel dizzy or you faint. This information is not intended to replace advice given to you by your health care provider. Make sure you discuss any questions you have with your health care provider. Document Released: 07/27/2000 Document Revised: 01/02/2016 Document Reviewed: 04/14/2015 Elsevier  Interactive Patient Education  2018 Elsevier Inc.  

## 2018-06-16 NOTE — Progress Notes (Signed)
Patient ID: [name], female    DOB: 04/14/1996  Age: 22 y.o. MRN: [id number]    Subjective:  Subjective  HPI NONNA RENNINGER presents for palpitations and fluc bp.  Psych put her on metoprolol due to fast heart rate but bp dropping low. 82/49 She had seizure like activity Mon 10/28 and went to hosp-- see has 48h eeg next week with Dr Karel Jarvis.  Nov 1 hr up and bp low.  Pt states her heart hurts and she felt like her heart stopped at one time.   She has had no chest pain , palp or low bp etc after stopping metoprolol   Review of Systems  Constitutional: Negative for chills and fever.  HENT: Negative for congestion and hearing loss.   Eyes: Negative for discharge.  Respiratory: Negative for cough and shortness of breath.   Cardiovascular: Negative for chest pain, palpitations and leg swelling.  Gastrointestinal: Negative for abdominal pain, blood in stool, constipation, diarrhea, nausea and vomiting.  Genitourinary: Negative for dysuria, frequency, hematuria and urgency.  Musculoskeletal: Negative for back pain and myalgias.  Skin: Negative for rash.  Allergic/Immunologic: Negative for environmental allergies.  Neurological: Positive for seizures. Negative for dizziness, weakness and headaches.  Hematological: Does not bruise/bleed easily.  Psychiatric/Behavioral: Negative for suicidal ideas. The patient is not nervous/anxious.     History Past Medical History:  Diagnosis Date  . ADHD (attention deficit hyperactivity disorder)   . Allergy   . Anxiety   . Bipolar disorder (HCC)   . Eczema 12/11/13    She has no past surgical history on file.   Her family history includes Asthma in her father and mother; COPD in her maternal grandmother; Diabetes in her maternal grandfather, paternal grandfather, and paternal grandmother; Heart disease in her father and maternal uncle; Hypertension in her father and maternal grandmother; Leukemia in her maternal uncle; Stroke in her father  and paternal grandmother.She reports that she has never smoked. She has never used smokeless tobacco. She reports that she drinks alcohol. She reports that she does not use drugs.  Current Outpatient Medications on File Prior to Visit  Medication Sig Dispense Refill  . amantadine (SYMMETREL) 100 MG capsule Take 100 mg by mouth 2 (two) times daily.  5  . fluticasone (FLONASE) 50 MCG/ACT nasal spray SPRAY 2 SPRAYS INTO EACH NOSTRIL EVERY DAY 16 g 2  . LORazepam (ATIVAN) 1 MG tablet Take 1 tablet by mouth 3 (three) times daily.  3  . metoprolol succinate (TOPROL-XL) 25 MG 24 hr tablet Take 1 tablet (25 mg total) by mouth daily before lunch. For high blood pressure 30 tablet 0  . norgestimate-ethinyl estradiol (SPRINTEC 28) 0.25-35 MG-MCG tablet Take 1 tablet by mouth daily. 84 tablet 3  . QUEtiapine (SEROQUEL) 400 MG tablet Take 1 tablet (400 mg total) by mouth at bedtime.     No current facility-administered medications on file prior to visit.      Objective:  Objective  Physical Exam  Constitutional: She is oriented to person, place, and time. She appears well-developed and well-nourished.  HENT:  Head: Normocephalic and atraumatic.  Eyes: Conjunctivae and EOM are normal.  Neck: Normal range of motion. Neck supple. No JVD present. Carotid bruit is not present. No thyromegaly present.  Cardiovascular: Normal rate, regular rhythm and normal heart sounds.  No murmur heard. Pulmonary/Chest: Effort normal and breath sounds normal. No respiratory distress. She has no wheezes. She has no rales. She exhibits no tenderness.  Musculoskeletal: She exhibits  no edema.  Neurological: She is alert and oriented to person, place, and time.  Psychiatric: She has a normal mood and affect.  Nursing note and vitals reviewed.  BP 119/75 (BP Location: Left Arm, Cuff Size: Normal)   Pulse 85   Temp 98.8 F (37.1 C) (Oral)   Resp 16   Ht 5\' 4"  (1.626 m)   Wt 160 lb 6.4 oz (72.8 kg)   LMP 06/07/2018    SpO2 100%   BMI 27.53 kg/m  Wt Readings from Last 3 Encounters:  06/16/18 160 lb 6.4 oz (72.8 kg)  04/18/18 150 lb 6 oz (68.2 kg)  04/03/18 154 lb 6.4 oz (70 kg)     Lab Results  Component Value Date   WBC 5.5 06/09/2018   HGB 13.3 06/09/2018   HCT 41.0 06/09/2018   PLT 265 06/09/2018   GLUCOSE 156 (H) 06/09/2018   CHOL 182 03/11/2018   TRIG 49 03/11/2018   HDL 75 03/11/2018   LDLCALC 97 03/11/2018   ALT 15 03/11/2018   AST 17 03/11/2018   NA 140 06/09/2018   K 4.1 06/09/2018   CL 108 06/09/2018   CREATININE 0.78 06/09/2018   BUN 14 06/09/2018   CO2 25 06/09/2018   TSH 2.069 03/11/2018   HGBA1C 4.9 03/11/2018   MICROALBUR 0.6 08/02/2014    No results found.   Assessment & Plan:  Plan  I have discontinued Jisel Fleet. Visser's valACYclovir. I am also having her maintain her metoprolol succinate, LORazepam, amantadine, norgestimate-ethinyl estradiol, fluticasone, and QUEtiapine.  No orders of the defined types were placed in this encounter.   Problem List Items Addressed This Visit    None    Visit Diagnoses    Bipolar 1 disorder (HCC)    -  Primary   Relevant Medications   QUEtiapine (SEROQUEL) 400 MG tablet   Other Relevant Orders   Comprehensive metabolic panel   CBC with Differential/Platelet   Thyroid Panel With TSH   Palpitations       Relevant Orders   Comprehensive metabolic panel   CBC with Differential/Platelet   Thyroid Panel With TSH    no more palp since stopping metoprolol If it occurs again -- go to ER or rto F/u neuro and psych  Follow-up: Return if symptoms worsen or fail to improve.  [name], DO

## 2018-06-17 LAB — CBC WITH DIFFERENTIAL/PLATELET
BASOS PCT: 1.3 % (ref 0.0–3.0)
Basophils Absolute: 0.1 10*3/uL (ref 0.0–0.1)
Eosinophils Absolute: 0.5 10*3/uL (ref 0.0–0.7)
Eosinophils Relative: 9.2 % — ABNORMAL HIGH (ref 0.0–5.0)
HCT: 39.9 % (ref 36.0–46.0)
HEMOGLOBIN: 13.6 g/dL (ref 12.0–15.0)
Lymphocytes Relative: 37.4 % (ref 12.0–46.0)
Lymphs Abs: 2.1 10*3/uL (ref 0.7–4.0)
MCHC: 34 g/dL (ref 30.0–36.0)
MCV: 90.1 fl (ref 78.0–100.0)
MONO ABS: 0.6 10*3/uL (ref 0.1–1.0)
MONOS PCT: 10.4 % (ref 3.0–12.0)
Neutro Abs: 2.4 10*3/uL (ref 1.4–7.7)
Neutrophils Relative %: 41.7 % — ABNORMAL LOW (ref 43.0–77.0)
Platelets: 308 10*3/uL (ref 150.0–400.0)
RBC: 4.43 Mil/uL (ref 3.87–5.11)
RDW: 13.7 % (ref 11.5–15.5)
WBC: 5.7 10*3/uL (ref 4.0–10.5)

## 2018-06-17 LAB — COMPREHENSIVE METABOLIC PANEL
ALT: 16 U/L (ref 0–35)
AST: 15 U/L (ref 0–37)
Albumin: 4.4 g/dL (ref 3.5–5.2)
Alkaline Phosphatase: 48 U/L (ref 39–117)
BUN: 13 mg/dL (ref 6–23)
CHLORIDE: 102 meq/L (ref 96–112)
CO2: 29 mEq/L (ref 19–32)
Calcium: 10 mg/dL (ref 8.4–10.5)
Creatinine, Ser: 0.77 mg/dL (ref 0.40–1.20)
GFR: 118.08 mL/min (ref >60.00)
GLUCOSE: 92 mg/dL (ref 70–99)
POTASSIUM: 4.5 meq/L (ref 3.5–5.1)
SODIUM: 137 meq/L (ref 135–145)
Total Bilirubin: 0.4 mg/dL (ref 0.2–1.2)
Total Protein: 7.3 g/dL (ref 6.0–8.3)

## 2018-06-17 LAB — THYROID PANEL WITH TSH
Free Thyroxine Index: 2.2 (ref 1.4–3.8)
T3 UPTAKE: 27 % (ref 22–35)
T4, Total: 8.1 ug/dL (ref 5.1–11.9)
TSH: 1.08 mIU/L

## 2018-06-26 DIAGNOSIS — F23 Brief psychotic disorder: Secondary | ICD-10-CM | POA: Diagnosis not present

## 2018-06-26 DIAGNOSIS — F3189 Other bipolar disorder: Secondary | ICD-10-CM | POA: Diagnosis not present

## 2018-07-02 DIAGNOSIS — F4323 Adjustment disorder with mixed anxiety and depressed mood: Secondary | ICD-10-CM | POA: Diagnosis not present

## 2018-07-09 DIAGNOSIS — F4323 Adjustment disorder with mixed anxiety and depressed mood: Secondary | ICD-10-CM | POA: Diagnosis not present

## 2018-07-14 ENCOUNTER — Other Ambulatory Visit: Payer: BC Managed Care – PPO

## 2018-07-17 DIAGNOSIS — F4322 Adjustment disorder with anxiety: Secondary | ICD-10-CM | POA: Diagnosis not present

## 2018-07-21 ENCOUNTER — Other Ambulatory Visit: Payer: 59 | Admitting: Neurology

## 2018-07-22 ENCOUNTER — Encounter (HOSPITAL_BASED_OUTPATIENT_CLINIC_OR_DEPARTMENT_OTHER): Payer: Self-pay | Admitting: *Deleted

## 2018-07-22 ENCOUNTER — Emergency Department (HOSPITAL_BASED_OUTPATIENT_CLINIC_OR_DEPARTMENT_OTHER)
Admission: EM | Admit: 2018-07-22 | Discharge: 2018-07-22 | Disposition: A | Discharge status: Home / Self Care | Payer: 59 | Attending: Emergency Medicine | Admitting: Emergency Medicine

## 2018-07-22 ENCOUNTER — Other Ambulatory Visit: Payer: Self-pay

## 2018-07-22 DIAGNOSIS — Z5321 Procedure and treatment not carried out due to patient leaving prior to being seen by health care provider: Secondary | ICD-10-CM | POA: Insufficient documentation

## 2018-07-22 DIAGNOSIS — R4781 Slurred speech: Secondary | ICD-10-CM | POA: Diagnosis not present

## 2018-07-22 DIAGNOSIS — R4182 Altered mental status, unspecified: Secondary | ICD-10-CM | POA: Diagnosis not present

## 2018-07-22 NOTE — ED Triage Notes (Signed)
Pt and her sister stated that for the past 4 weeks the pt has become more altered over time, forgetful, not remebering what she is doing, and at times having slurred speech. None of these are noted at the present time.

## 2018-07-22 NOTE — ED Notes (Signed)
No answer

## 2018-07-25 NOTE — ED Provider Notes (Signed)
I did not see or evaluate the patient.  The patient left prior to my evaluation.   [name], Phillip Sandler, MD 07/25/18 [phone]

## 2018-07-29 DIAGNOSIS — F3132 Bipolar disorder, current episode depressed, moderate: Secondary | ICD-10-CM | POA: Diagnosis not present

## 2018-07-29 DIAGNOSIS — F209 Schizophrenia, unspecified: Secondary | ICD-10-CM | POA: Diagnosis not present

## 2018-07-30 DIAGNOSIS — F4323 Adjustment disorder with mixed anxiety and depressed mood: Secondary | ICD-10-CM | POA: Diagnosis not present

## 2018-08-08 ENCOUNTER — Ambulatory Visit (INDEPENDENT_AMBULATORY_CARE_PROVIDER_SITE_OTHER): Payer: 59 | Admitting: Neurology

## 2018-08-08 ENCOUNTER — Encounter: Payer: Self-pay | Admitting: Neurology

## 2018-08-08 ENCOUNTER — Other Ambulatory Visit: Payer: Self-pay

## 2018-08-08 ENCOUNTER — Encounter

## 2018-08-08 VITALS — BP 114/56 | HR 105 | Ht 64.0 in | Wt 164.0 lb

## 2018-08-08 DIAGNOSIS — F688 Other specified disorders of adult personality and behavior: Secondary | ICD-10-CM | POA: Diagnosis not present

## 2018-08-08 DIAGNOSIS — R443 Hallucinations, unspecified: Secondary | ICD-10-CM | POA: Diagnosis not present

## 2018-08-08 DIAGNOSIS — R251 Tremor, unspecified: Secondary | ICD-10-CM | POA: Diagnosis not present
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
## 2018-08-08 NOTE — Patient Instructions (Signed)
1. Schedule 48-hour EEG 2. Bloodwork through [facility] will be ordered 3. Continue follow-up with Psychiatry 4. Follow-up in 6 months, call for any changes

## 2018-08-11 ENCOUNTER — Telehealth: Payer: Self-pay | Admitting: *Deleted

## 2018-08-11 NOTE — Telephone Encounter (Signed)
Called to schedule ambulatory EEG. No answer and mailbox is full. Will try again at later date

## 2018-08-16 ENCOUNTER — Other Ambulatory Visit: Payer: Self-pay

## 2018-08-16 ENCOUNTER — Emergency Department (HOSPITAL_BASED_OUTPATIENT_CLINIC_OR_DEPARTMENT_OTHER)
Admission: EM | Admit: 2018-08-16 | Discharge: 2018-08-17 | Disposition: A | Discharge status: Home / Self Care | Payer: 59 | Attending: Emergency Medicine | Admitting: Emergency Medicine

## 2018-08-16 ENCOUNTER — Encounter (HOSPITAL_BASED_OUTPATIENT_CLINIC_OR_DEPARTMENT_OTHER): Payer: Self-pay | Admitting: Emergency Medicine

## 2018-08-16 DIAGNOSIS — R112 Nausea with vomiting, unspecified: Secondary | ICD-10-CM | POA: Insufficient documentation

## 2018-08-16 DIAGNOSIS — R42 Dizziness and giddiness: Secondary | ICD-10-CM | POA: Diagnosis not present

## 2018-08-16 DIAGNOSIS — Z79899 Other long term (current) drug therapy: Secondary | ICD-10-CM | POA: Insufficient documentation

## 2018-08-16 LAB — CBC
HEMATOCRIT: 40.5 % (ref 36.0–46.0)
HEMOGLOBIN: 13.2 g/dL (ref 12.0–15.0)
MCH: 29.3 pg (ref 26.0–34.0)
MCHC: 32.6 g/dL (ref 30.0–36.0)
MCV: 90 fL (ref 80.0–100.0)
NRBC: 0 % (ref 0.0–0.2)
Platelets: 318 10*3/uL (ref 150–400)
RBC: 4.5 MIL/uL (ref 3.87–5.11)
RDW: 13.4 % (ref 11.5–15.5)
WBC: 8.1 10*3/uL (ref 4.0–10.5)

## 2018-08-16 LAB — URINALYSIS, ROUTINE W REFLEX MICROSCOPIC
BILIRUBIN URINE: NEGATIVE
Glucose, UA: NEGATIVE mg/dL
Ketones, ur: NEGATIVE mg/dL
NITRITE: NEGATIVE
Protein, ur: NEGATIVE mg/dL
Specific Gravity, Urine: 1.01 (ref 1.005–1.030)
pH: 6 (ref 5.0–8.0)

## 2018-08-16 LAB — COMPREHENSIVE METABOLIC PANEL
ALT: 31 U/L (ref 0–44)
ANION GAP: 8 (ref 5–15)
AST: 27 U/L (ref 15–41)
Albumin: 4.4 g/dL (ref 3.5–5.0)
Alkaline Phosphatase: 44 U/L (ref 38–126)
BUN: 12 mg/dL (ref 6–20)
CO2: 25 mmol/L (ref 22–32)
CREATININE: 0.62 mg/dL (ref 0.44–1.00)
Calcium: 9.7 mg/dL (ref 8.9–10.3)
Chloride: 101 mmol/L (ref 98–111)
GFR calc Af Amer: >60 mL/min (ref >60)
Glucose, Bld: 127 mg/dL — ABNORMAL HIGH (ref 70–99)
Potassium: 3.9 mmol/L (ref 3.5–5.1)
Sodium: 134 mmol/L — ABNORMAL LOW (ref 135–145)
Total Bilirubin: 0.8 mg/dL (ref 0.3–1.2)
Total Protein: 8 g/dL (ref 6.5–8.1)

## 2018-08-16 LAB — RAPID URINE DRUG SCREEN, HOSP PERFORMED
AMPHETAMINES: NOT DETECTED
Barbiturates: NOT DETECTED
Benzodiazepines: NOT DETECTED
COCAINE: NOT DETECTED
OPIATES: NOT DETECTED
Tetrahydrocannabinol: NOT DETECTED

## 2018-08-16 LAB — URINALYSIS, MICROSCOPIC (REFLEX)

## 2018-08-16 LAB — PREGNANCY, URINE: PREG TEST UR: NEGATIVE

## 2018-08-16 LAB — LIPASE, BLOOD: LIPASE: 26 U/L (ref 11–51)

## 2018-08-16 MED ORDER — ONDANSETRON HCL 4 MG/2ML IJ SOLN
4.0000 mg | Freq: Once | INTRAMUSCULAR | Status: AC
  Administered 2018-08-16: 4 mg via INTRAVENOUS
  Filled 2018-08-16: qty 2

## 2018-08-16 MED ORDER — SODIUM CHLORIDE 0.9 % IV BOLUS
1000.0000 mL | Freq: Once | INTRAVENOUS | Status: AC
  Administered 2018-08-16: 1000 mL via INTRAVENOUS

## 2018-08-16 MED ORDER — ONDANSETRON 4 MG PO TBDP
4.0000 mg | ORAL_TABLET | Freq: Once | ORAL | Status: AC | PRN
  Administered 2018-08-16: 4 mg via ORAL
  Filled 2018-08-16: qty 1

## 2018-08-16 NOTE — ED Notes (Signed)
Lab stated they did not have enough urine for all tests. EMT asked pt to provide another sample. Pt attempting urine sample now.

## 2018-08-16 NOTE — ED Notes (Signed)
Pt attempted to give urine sample but was unsuccessful. Cup given to patient in waiting room

## 2018-08-16 NOTE — ED Triage Notes (Signed)
Pt states she feels lightheaded around 1730 and then vomited around 1745. Denies fever. Endorse nausea and vomiting. pts family states she has a history of a seizure but is following with neurology.

## 2018-08-16 NOTE — ED Provider Notes (Signed)
[facility] EMERGENCY DEPARTMENT Provider Note   CSN: [id number] Arrival date & time: 08/16/18  1916     History   Chief Complaint Chief Complaint  Patient presents with  . Nausea  . Dizziness    HPI [name] is a 23 y.o. female.  The history is provided by the patient.  Dizziness  She has history of attention deficit disorder, bipolar disorder, schizoaffective disorder and possible seizures and comes in with onset about 5:30 PM of dizziness and vomiting.  Dizziness is described as a near syncopal feeling.  She has vomited twice and continues to have waves of nausea.  Dizziness also comes and goes without any particular pattern.  She denies fever, chills, sweats.  There is been no constipation or diarrhea.  She denies any sick contacts.  Blood pressure at home was elevated to 154 systolic.  Mother is concerned because when blood pressure is going up in the past, she has had seizures.  She is scheduled to be admitted for 48-hour EEG monitoring to determine whether these are truly seizures.  Also, mother is concerned of possible drug use.  She has spent the last 2 days with her boyfriend, and has had problems when she comes back after spending time with boyfriend and mother is concerned about possible drug use.  Patient denies this.  Past Medical History:  Diagnosis Date  . ADHD (attention deficit hyperactivity disorder)   . Allergy   . Anxiety   . Bipolar disorder (HCC)   . Eczema 12/11/13    Patient Active Problem List   Diagnosis Date Noted  . Constipation 04/03/2018  . Schizoaffective disorder (HCC) 03/08/2018  . Bipolar disorder, current episode manic severe with psychotic features (HCC)   . MDD (major depressive disorder), recurrent severe, without psychosis (HCC) 02/11/2018  . Pelvic pain 01/31/2018  . Genital herpes simplex 10/24/2017  . Anxiety 08/30/2017  . Severe bipolar I disorder, current or most recent episode depressed (HCC) 03/30/2017  .  Syncope 03/03/2017  . STD exposure 05/13/2015  . Preventative health care 03/16/2013  . Bipolar disorder (HCC) 12/30/2012  . ADD (attention deficit disorder) 04/24/2011    History reviewed. No pertinent surgical history.   OB History   No obstetric history on file.      Home Medications    Prior to Admission medications   Medication Sig Start Date End Date Taking? Authorizing Provider  amantadine (SYMMETREL) 100 MG capsule Take 100 mg by mouth 2 (two) times daily. 04/04/18   [provider]  fluticasone (FLONASE) 50 MCG/ACT nasal spray SPRAY 2 SPRAYS INTO EACH NOSTRIL EVERY DAY 06/10/18   Zola Button, Grayling Congress, DO  LORazepam (ATIVAN) 1 MG tablet Take 1 tablet by mouth 3 (three) times daily. 03/15/18   [provider]  lurasidone (LATUDA) 20 MG TABS tablet Take by mouth.    [provider]  metoprolol succinate (TOPROL-XL) 25 MG 24 hr tablet Take 1 tablet (25 mg total) by mouth daily before lunch. For high blood pressure 03/14/18   Armandina Stammer I, NP  norgestimate-ethinyl estradiol (SPRINTEC 28) 0.25-35 MG-MCG tablet Take 1 tablet by mouth daily. 05/29/18   [name], DO  QUEtiapine (SEROQUEL) 400 MG tablet Take 400 mg by mouth at bedtime.  06/16/18   [name], DO    Family History Family History  Problem Relation Age of Onset  . Asthma Mother   . Stroke Father   . Heart disease Father   .  Asthma Father   . Hypertension Father        pulmonary hypertension  . Heart disease Maternal Uncle   . Hypertension Maternal Grandmother   . COPD Maternal Grandmother   . Diabetes Maternal Grandfather   . Stroke Paternal Grandmother   . Diabetes Paternal Grandmother   . Diabetes Paternal Grandfather   . Leukemia Maternal Uncle     Social History Social History   Tobacco Use  . Smoking status: Never Smoker  . Smokeless tobacco: Never Used  Substance Use Topics  . Alcohol use: Yes    Comment: occasionally  . Drug use: No      Allergies   Concerta [methylphenidate]; Terbinafine; Clindamycin hcl; and Terbinafine hcl   Review of Systems Review of Systems  Neurological: Positive for dizziness.  All other systems reviewed and are negative.    Physical Exam Updated Vital Signs BP 113/85 (BP Location: Left Arm)   Pulse 98   Temp 98.1 F (36.7 C) (Oral)   Resp 18   Ht 5\' 4"  (1.626 m)   Wt 74.4 kg   LMP 08/05/2018   SpO2 100%   BMI 28.15 kg/m   Physical Exam Vitals signs and nursing note reviewed.    23 year old female, resting comfortably and in no acute distress. Vital signs are normal. Oxygen saturation is 100%, which is normal. Head is normocephalic and atraumatic. PERRLA, EOMI. Oropharynx is clear. Neck is nontender and supple without adenopathy or JVD. Back is nontender and there is no CVA tenderness. Lungs are clear without rales, wheezes, or rhonchi. Chest is nontender. Heart has regular rate and rhythm without murmur. Abdomen is soft, flat, nontender without masses or hepatosplenomegaly and peristalsis is hypoactive. Extremities have no cyanosis or edema, full range of motion is present. Skin is warm and dry without rash. Neurologic: Mental status is normal, cranial nerves are intact, there are no motor or sensory deficits.  ED Treatments / Results  Labs (all labs ordered are listed, but only abnormal results are displayed) Labs Reviewed  URINALYSIS, ROUTINE W REFLEX MICROSCOPIC - Abnormal; Notable for the following components:      Result Value   Hgb urine dipstick TRACE (*)    Leukocytes, UA SMALL (*)    All other components within normal limits  COMPREHENSIVE METABOLIC PANEL - Abnormal; Notable for the following components:   Sodium 134 (*)    Glucose, Bld 127 (*)    All other components within normal limits  URINALYSIS, MICROSCOPIC (REFLEX) - Abnormal; Notable for the following components:   Bacteria, UA MANY (*)    All other components within normal limits  PREGNANCY,  URINE  LIPASE, BLOOD  CBC  RAPID URINE DRUG SCREEN, HOSP PERFORMED    Procedures Procedures   Medications Ordered in ED Medications  sodium chloride 0.9 % bolus 1,000 mL (1,000 mLs Intravenous New Bag/Given 08/16/18 2324)  ondansetron (ZOFRAN-ODT) disintegrating tablet 4 mg (4 mg Oral Given 08/16/18 1942)  ondansetron (ZOFRAN) injection 4 mg (4 mg Intravenous Given 08/16/18 2315)     Initial Impression / Assessment and Plan / ED Course  I have reviewed the triage vital signs and the nursing notes.  Pertinent labs & imaging results that were available during my care of the patient were reviewed by me and considered in my medical decision making (see chart for details).  Nausea and dizziness without any red flags to suggest serious illness.  Suspect viral gastritis.  She had received a dose of ondansetron at triage and  this has helped the nausea somewhat.  Labs obtained at triage were significant only for glucose of 127.  Urine specific gravity is normal.  Blood pressure is normal.  She will be given IV fluids and additional IV ondansetron.  Patient is agreeable to urine drug screen.  I have explained to patient's mother that negative drug screen does not mean that she is not using drugs.  Old records are reviewed, and she has been seen in the ED several times for possible seizures and altered mentation.  She feels better after IV fluids and IV ondansetron.  Drug screen is negative.  She is discharged with prescription for ondansetron.  Return precautions discussed.  Final Clinical Impressions(s) / ED Diagnoses   Final diagnoses:  Non-intractable vomiting with nausea, unspecified vomiting type    ED Discharge Orders         Ordered    ondansetron (ZOFRAN) 4 MG tablet  Every 6 hours PRN     08/17/18 0018           Dione BoozeGlick, Darrien Laakso, MD 08/17/18 0021

## 2018-08-17 MED ORDER — ONDANSETRON HCL 4 MG PO TABS: 4.0000 mg | ORAL_TABLET | Freq: Four times a day (QID) | ORAL | 0 refills | Status: DC | PRN

## 2018-08-17 NOTE — ED Notes (Signed)
Pt and family understood dc material. NAD noted. Script given at dc 

## 2018-08-18 ENCOUNTER — Other Ambulatory Visit: Payer: 59 | Admitting: Neurology

## 2018-08-20 DIAGNOSIS — F209 Schizophrenia, unspecified: Secondary | ICD-10-CM | POA: Diagnosis not present

## 2018-08-26 MED FILL — PREVIFEM 0.25-35 MG-MCG TAB: 0.25-35 | 84 days supply | Qty: 84 | Fill #1

## 2018-08-27 DIAGNOSIS — F4323 Adjustment disorder with mixed anxiety and depressed mood: Secondary | ICD-10-CM | POA: Diagnosis not present

## 2018-08-28 DIAGNOSIS — F4323 Adjustment disorder with mixed anxiety and depressed mood: Secondary | ICD-10-CM | POA: Diagnosis not present

## 2018-08-29 DIAGNOSIS — F22 Delusional disorders: Secondary | ICD-10-CM | POA: Diagnosis not present

## 2018-08-29 DIAGNOSIS — F29 Unspecified psychosis not due to a substance or known physiological condition: Secondary | ICD-10-CM | POA: Diagnosis not present

## 2018-09-30 ENCOUNTER — Ambulatory Visit: Payer: 59 | Admitting: Family Medicine

## 2018-09-30 ENCOUNTER — Encounter: Payer: Self-pay | Admitting: Family Medicine

## 2018-09-30 VITALS — BP 118/62 | HR 62 | Resp 12 | Ht 64.0 in | Wt 152.0 lb

## 2018-09-30 DIAGNOSIS — R5383 Other fatigue: Secondary | ICD-10-CM

## 2018-09-30 NOTE — Patient Instructions (Signed)

## 2018-09-30 NOTE — Progress Notes (Signed)
Patient ID: [name], female    DOB: [birth year]  Age: 23 y.o. MRN: [id number]    Subjective:  Subjective  HPI [name] presents with mom for inc fatigue.  She is now on latuda for bipolar and doing well.  The fatigue has worsened over the last several weeks.  She are requesting her vita d   Review of Systems  Constitutional: Positive for fatigue. Negative for appetite change, diaphoresis and unexpected weight change.  Eyes: Negative for pain, redness and visual disturbance.  Respiratory: Negative for cough, chest tightness, shortness of breath and wheezing.   Cardiovascular: Negative for chest pain, palpitations and leg swelling.  Endocrine: Negative for cold intolerance, heat intolerance, polydipsia, polyphagia and polyuria.  Genitourinary: Negative for difficulty urinating, dysuria and frequency.  Neurological: Negative for dizziness, light-headedness, numbness and headaches.    History Past Medical History:  Diagnosis Date  . ADHD (attention deficit hyperactivity disorder)   . Allergy   . Anxiety   . Bipolar disorder (HCC)   . Eczema 12/11/13    She has no past surgical history on file.   Her family history includes Asthma in her father and mother; COPD in her maternal grandmother; Diabetes in her maternal grandfather, paternal grandfather, and paternal grandmother; Heart disease in her father and maternal uncle; Hypertension in her father and maternal grandmother; Leukemia in her maternal uncle; Stroke in her father and paternal grandmother.She reports that she has never smoked. She has never used smokeless tobacco. She reports current alcohol use. She reports that she does not use drugs.  Current Outpatient Medications on File Prior to Visit  Medication Sig Dispense Refill  . fluticasone (FLONASE) 50 MCG/ACT nasal spray SPRAY 2 SPRAYS INTO EACH NOSTRIL EVERY DAY 16 g 2  . LORazepam (ATIVAN) 1 MG tablet Take 1 tablet by mouth 3 (three) times daily.  3  .  lurasidone (LATUDA) 20 MG TABS tablet Take by mouth.    . metoprolol succinate (TOPROL-XL) 25 MG 24 hr tablet Take 1 tablet (25 mg total) by mouth daily before lunch. For high blood pressure 30 tablet 0  . norgestimate-ethinyl estradiol (SPRINTEC 28) 0.25-35 MG-MCG tablet Take 1 tablet by mouth daily. 84 tablet 3  . ondansetron (ZOFRAN) 4 MG tablet Take 1 tablet (4 mg total) by mouth every 6 (six) hours as needed for nausea or vomiting. 12 tablet 0   No current facility-administered medications on file prior to visit.      Objective:  Objective  Physical Exam Vitals signs and nursing note reviewed.  Constitutional:      Appearance: She is well-developed.  HENT:     Head: Normocephalic and atraumatic.  Eyes:     Conjunctiva/sclera: Conjunctivae normal.  Neck:     Musculoskeletal: Normal range of motion and neck supple.     Thyroid: No thyromegaly.     Vascular: No carotid bruit or JVD.  Cardiovascular:     Rate and Rhythm: Normal rate and regular rhythm.     Heart sounds: Normal heart sounds. No murmur.  Pulmonary:     Effort: Pulmonary effort is normal. No respiratory distress.     Breath sounds: Normal breath sounds. No wheezing or rales.  Chest:     Chest wall: No tenderness.  Neurological:     Mental Status: She is alert and oriented to person, place, and time.  Psychiatric:        Mood and Affect: Mood normal.        Behavior: Behavior  normal.        Thought Content: Thought content normal.        Judgment: Judgment normal.    BP 118/62   Pulse 62   Resp 12   Ht 5\' 4"  (1.626 m)   Wt 152 lb (68.9 kg)   SpO2 97%   BMI 26.09 kg/m  Wt Readings from Last 3 Encounters:  09/30/18 152 lb (68.9 kg)  08/16/18 164 lb (74.4 kg)  08/08/18 164 lb (74.4 kg)     Lab Results  Component Value Date   WBC 6.1 09/30/2018   HGB 13.2 09/30/2018   HCT 39.6 09/30/2018   PLT 308.0 09/30/2018   GLUCOSE 68 (L) 09/30/2018   CHOL 182 03/11/2018   TRIG 49 03/11/2018   HDL 75  03/11/2018   LDLCALC 97 03/11/2018   ALT 14 09/30/2018   AST 14 09/30/2018   NA 139 09/30/2018   K 4.4 09/30/2018   CL 105 09/30/2018   CREATININE 0.74 09/30/2018   BUN 15 09/30/2018   CO2 27 09/30/2018   TSH 0.47 09/30/2018   HGBA1C 4.9 03/11/2018   MICROALBUR 0.6 08/02/2014    No results found.   Assessment & Plan:  Plan  I have discontinued [name]. [name]'s amantadine and QUEtiapine. I am also having her maintain her metoprolol succinate, LORazepam, norgestimate-ethinyl estradiol, fluticasone, lurasidone, and ondansetron.  No orders of the defined types were placed in this encounter.   Problem List Items Addressed This Visit    None    Visit Diagnoses    Fatigue, unspecified type    -  Primary   Relevant Orders   CBC with Differential/Platelet (Completed)   TSH (Completed)   Vitamin B12 (Completed)   Comprehensive metabolic panel (Completed)   Vitamin D (25 hydroxy) (Completed)    check labs today Ok to start vita D otc daily    Follow-up: Return if symptoms worsen or fail to improve.  [name], DO

## 2018-10-01 LAB — CBC WITH DIFFERENTIAL/PLATELET
BASOS ABS: 0.1 10*3/uL (ref 0.0–0.1)
Basophils Relative: 1.3 % (ref 0.0–3.0)
EOS ABS: 0.2 10*3/uL (ref 0.0–0.7)
Eosinophils Relative: 3.2 % (ref 0.0–5.0)
HCT: 39.6 % (ref 36.0–46.0)
Hemoglobin: 13.2 g/dL (ref 12.0–15.0)
Lymphocytes Relative: 41.7 % (ref 12.0–46.0)
Lymphs Abs: 2.5 10*3/uL (ref 0.7–4.0)
MCHC: 33.4 g/dL (ref 30.0–36.0)
MCV: 90.4 fl (ref 78.0–100.0)
Monocytes Absolute: 0.6 10*3/uL (ref 0.1–1.0)
Monocytes Relative: 9.2 % (ref 3.0–12.0)
NEUTROS ABS: 2.7 10*3/uL (ref 1.4–7.7)
Neutrophils Relative %: 44.6 % (ref 43.0–77.0)
PLATELETS: 308 10*3/uL (ref 150.0–400.0)
RBC: 4.38 Mil/uL (ref 3.87–5.11)
RDW: 13.9 % (ref 11.5–15.5)
WBC: 6.1 10*3/uL (ref 4.0–10.5)

## 2018-10-01 LAB — COMPREHENSIVE METABOLIC PANEL
ALT: 14 U/L (ref 0–35)
AST: 14 U/L (ref 0–37)
Albumin: 4.1 g/dL (ref 3.5–5.2)
Alkaline Phosphatase: 39 U/L (ref 39–117)
BUN: 15 mg/dL (ref 6–23)
CO2: 27 mEq/L (ref 19–32)
Calcium: 9.3 mg/dL (ref 8.4–10.5)
Chloride: 105 mEq/L (ref 96–112)
Creatinine, Ser: 0.74 mg/dL (ref 0.40–1.20)
GFR: 116.03 mL/min (ref >60.00)
GLUCOSE: 68 mg/dL — AB (ref 70–99)
Potassium: 4.4 mEq/L (ref 3.5–5.1)
Sodium: 139 mEq/L (ref 135–145)
Total Bilirubin: 0.3 mg/dL (ref 0.2–1.2)
Total Protein: 6.9 g/dL (ref 6.0–8.3)

## 2018-10-01 LAB — VITAMIN D 25 HYDROXY (VIT D DEFICIENCY, FRACTURES): VITD: 21.86 ng/mL — ABNORMAL LOW (ref 30.00–100.00)

## 2018-10-01 LAB — TSH: TSH: 0.47 u[IU]/mL (ref 0.35–4.50)

## 2018-10-01 LAB — VITAMIN B12: VITAMIN B 12: 295 pg/mL (ref 211–911)

## 2018-10-04 ENCOUNTER — Other Ambulatory Visit: Payer: Self-pay | Admitting: Family Medicine

## 2018-10-04 DIAGNOSIS — E559 Vitamin D deficiency, unspecified: Secondary | ICD-10-CM

## 2018-10-04 MED ORDER — VITAMIN D (ERGOCALCIFEROL) 1.25 MG (50000 UNIT) PO CAPS: 50000.0000 [IU] | ORAL_CAPSULE | ORAL | 0 refills | Status: DC

## 2018-10-10 MED ORDER — VITAMIN D (ERGOCALCIFEROL) 1.25 MG (50000 UNIT) PO CAPS: 50000.0000 [IU] | ORAL_CAPSULE | ORAL | 0 refills | Status: DC

## 2018-10-10 MED FILL — VIT D2 1.25 MG (50,000 UNIT: 1.25 MG | 84 days supply | Qty: 12 | Fill #0

## 2018-10-10 NOTE — Addendum Note (Signed)
Addended by: Scharlene Gloss B on: 10/10/2018 12:49 PM   Modules accepted: Orders

## 2018-10-17 MED FILL — LATUDA 120 MG TABLET: 120 | 30 days supply | Qty: 30 | Fill #0

## 2018-10-17 MED FILL — FLUTICASONE PROP 50 MCG SPR: 50 | 30 days supply | Qty: 16 | Fill #0

## 2018-10-28 ENCOUNTER — Telehealth: Payer: Self-pay | Admitting: *Deleted

## 2018-10-28 ENCOUNTER — Ambulatory Visit: Payer: 59 | Admitting: Neurology

## 2018-10-28 NOTE — Telephone Encounter (Signed)
Spoke with mother and heard patient in background and she stated that patient feels much better.

## 2018-10-28 NOTE — Telephone Encounter (Signed)
Looks like patients sister called  "Caller sister temp was 100.0, upset stomach, headaches, sneezing and coughing"   Looks like the nurse advised to go to ED.  I do not see any record of ED or urgent care visits yet.

## 2018-10-28 NOTE — Telephone Encounter (Signed)
Check on her today

## 2018-11-07 DIAGNOSIS — F22 Delusional disorders: Secondary | ICD-10-CM | POA: Diagnosis not present

## 2018-11-07 DIAGNOSIS — R251 Tremor, unspecified: Secondary | ICD-10-CM | POA: Diagnosis not present

## 2018-11-07 DIAGNOSIS — F251 Schizoaffective disorder, depressive type: Secondary | ICD-10-CM | POA: Diagnosis not present

## 2018-11-07 DIAGNOSIS — F900 Attention-deficit hyperactivity disorder, predominantly inattentive type: Secondary | ICD-10-CM | POA: Diagnosis not present

## 2018-11-10 ENCOUNTER — Telehealth: Payer: Self-pay

## 2018-11-10 NOTE — Telephone Encounter (Signed)
Patient called back to let the office know she just had a cold. She is not coughing anymore and fever is gone. [phone] and her mom is [phone]

## 2018-11-10 NOTE — Telephone Encounter (Signed)
Received notes today from [facility]. Follow up call made to patient/mother. No answer left message for return call.

## 2018-11-14 MED FILL — LATUDA 120 MG TABLET: 120 | 30 days supply | Qty: 30 | Fill #1

## 2018-11-17 ENCOUNTER — Other Ambulatory Visit: Payer: Self-pay

## 2018-11-17 ENCOUNTER — Telehealth (INDEPENDENT_AMBULATORY_CARE_PROVIDER_SITE_OTHER): Payer: 59 | Admitting: Neurology

## 2018-11-17 VITALS — BP 108/65 | HR 101

## 2018-11-17 DIAGNOSIS — R443 Hallucinations, unspecified: Secondary | ICD-10-CM

## 2018-11-17 DIAGNOSIS — R251 Tremor, unspecified: Secondary | ICD-10-CM

## 2018-11-17 DIAGNOSIS — F688 Other specified disorders of adult personality and behavior: Secondary | ICD-10-CM
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
## 2018-11-20 MED FILL — lamoTRIgine 25 MG TABS: 25 | 30 days supply | Qty: 120 | Fill #0

## 2018-11-25 MED FILL — ZIPRASIDONE HCL 80 MG CAP: 80 | 30 days supply | Qty: 60 | Fill #0

## 2018-11-26 ENCOUNTER — Ambulatory Visit: Payer: 59 | Admitting: Neurology

## 2018-12-12 ENCOUNTER — Encounter

## 2018-12-12 ENCOUNTER — Ambulatory Visit: Payer: Self-pay | Admitting: Neurology

## 2018-12-18 DIAGNOSIS — F22 Delusional disorders: Secondary | ICD-10-CM | POA: Diagnosis not present

## 2018-12-30 DIAGNOSIS — F209 Schizophrenia, unspecified: Secondary | ICD-10-CM | POA: Diagnosis not present

## 2019-01-13 ENCOUNTER — Other Ambulatory Visit: Payer: 59

## 2019-01-23 DIAGNOSIS — F209 Schizophrenia, unspecified: Secondary | ICD-10-CM | POA: Diagnosis not present

## 2019-02-17 DIAGNOSIS — F29 Unspecified psychosis not due to a substance or known physiological condition: Secondary | ICD-10-CM | POA: Diagnosis not present

## 2019-02-17 DIAGNOSIS — Z915 Personal history of self-harm: Secondary | ICD-10-CM | POA: Diagnosis not present

## 2019-02-17 DIAGNOSIS — L309 Dermatitis, unspecified: Secondary | ICD-10-CM | POA: Diagnosis not present

## 2019-02-17 DIAGNOSIS — J302 Other seasonal allergic rhinitis: Secondary | ICD-10-CM | POA: Diagnosis not present

## 2019-02-17 DIAGNOSIS — F319 Bipolar disorder, unspecified: Secondary | ICD-10-CM | POA: Diagnosis not present

## 2019-02-17 DIAGNOSIS — G4700 Insomnia, unspecified: Secondary | ICD-10-CM | POA: Diagnosis not present

## 2019-02-17 DIAGNOSIS — R45851 Suicidal ideations: Secondary | ICD-10-CM | POA: Diagnosis not present

## 2019-02-23 DIAGNOSIS — F29 Unspecified psychosis not due to a substance or known physiological condition: Secondary | ICD-10-CM | POA: Diagnosis not present

## 2019-02-24 MED FILL — HYDROXYZINE PAM 25 MG CAP: 25 | 30 days supply | Qty: 60 | Fill #0

## 2019-02-24 MED FILL — OLANZapine 20 MG TABS: 20 | 30 days supply | Qty: 30 | Fill #0

## 2019-02-25 DIAGNOSIS — F29 Unspecified psychosis not due to a substance or known physiological condition: Secondary | ICD-10-CM | POA: Diagnosis not present

## 2019-02-27 MED FILL — PREVIFEM 0.25-35 MG-MCG TAB: 0.25-35 | 84 days supply | Qty: 84 | Fill #2

## 2019-03-01 ENCOUNTER — Other Ambulatory Visit: Payer: Self-pay

## 2019-03-01 ENCOUNTER — Encounter (HOSPITAL_BASED_OUTPATIENT_CLINIC_OR_DEPARTMENT_OTHER): Payer: Self-pay | Admitting: *Deleted

## 2019-03-01 ENCOUNTER — Emergency Department (HOSPITAL_BASED_OUTPATIENT_CLINIC_OR_DEPARTMENT_OTHER)
Admission: EM | Admit: 2019-03-01 | Discharge: 2019-03-01 | Disposition: A | Discharge status: Home / Self Care | Payer: 59 | Attending: Emergency Medicine | Admitting: Emergency Medicine

## 2019-03-01 DIAGNOSIS — J020 Streptococcal pharyngitis: Secondary | ICD-10-CM | POA: Insufficient documentation

## 2019-03-01 DIAGNOSIS — Z79899 Other long term (current) drug therapy: Secondary | ICD-10-CM | POA: Diagnosis not present

## 2019-03-01 DIAGNOSIS — J029 Acute pharyngitis, unspecified: Secondary | ICD-10-CM | POA: Diagnosis present

## 2019-03-01 LAB — PREGNANCY, URINE: Preg Test, Ur: NEGATIVE

## 2019-03-01 LAB — GROUP A STREP BY PCR: Group A Strep by PCR: DETECTED — AB

## 2019-03-01 MED ORDER — PENICILLIN G BENZATHINE 1200000 UNIT/2ML IM SUSP
1.2000 10*6.[IU] | Freq: Once | INTRAMUSCULAR | Status: AC
  Administered 2019-03-01: 1.2 10*6.[IU] via INTRAMUSCULAR
  Filled 2019-03-01: qty 2

## 2019-03-01 MED ORDER — DEXAMETHASONE 6 MG PO TABS
10.0000 mg | ORAL_TABLET | Freq: Once | ORAL | Status: AC
  Administered 2019-03-01: 10 mg via ORAL
  Filled 2019-03-01: qty 1

## 2019-03-01 NOTE — ED Provider Notes (Signed)
[facility] EMERGENCY DEPARTMENT Provider Note   CSN: [id number] Arrival date & time: 03/01/19  0901    History   Chief Complaint Chief Complaint  Patient presents with  . Sore Throat    HPI [name] is a 23 y.o. female.     HPI Patient presents with sore throat and fevers.  Began 2 days ago.  Fever up to 101.  No cough.  Pain is worse with swallowing.  No nausea or vomiting.  No known sick contacts.  Mild difficulty swallowing.  No known COVID contacts.  No difficulty breathing. Past Medical History:  Diagnosis Date  . ADHD (attention deficit hyperactivity disorder)   . Allergy   . Anxiety   . Bipolar disorder (HCC)   . Eczema 12/11/13    Patient Active Problem List   Diagnosis Date Noted  . Constipation 04/03/2018  . Schizoaffective disorder (HCC) 03/08/2018  . Bipolar disorder, current episode manic severe with psychotic features (HCC)   . MDD (major depressive disorder), recurrent severe, without psychosis (HCC) 02/11/2018  . Pelvic pain 01/31/2018  . Genital herpes simplex 10/24/2017  . Anxiety 08/30/2017  . Severe bipolar I disorder, current or most recent episode depressed (HCC) 03/30/2017  . Syncope 03/03/2017  . STD exposure 05/13/2015  . Preventative health care 03/16/2013  . Bipolar disorder (HCC) 12/30/2012  . ADD (attention deficit disorder) 04/24/2011    History reviewed. No pertinent surgical history.   OB History   No obstetric history on file.      Home Medications    Prior to Admission medications   Medication Sig Start Date End Date Taking? Authorizing Provider  cetirizine (ZYRTEC) 5 MG tablet Take 5 mg by mouth daily.   Yes [provider]  fluticasone (FLONASE) 50 MCG/ACT nasal spray SPRAY 2 SPRAYS INTO EACH NOSTRIL EVERY DAY 06/10/18  Yes [name], Yvonne R, DO  LORazepam (ATIVAN) 1 MG tablet Take 1 mg by mouth at bedtime.   Yes [provider]  norgestimate-ethinyl estradiol (SPRINTEC 28)  0.25-35 MG-MCG tablet Take 1 tablet by mouth daily. Patient not taking: Reported on 11/17/2018 05/29/18   Donato SchultzLowne Chase, Yvonne R, DO    Family History Family History  Problem Relation Age of Onset  . Asthma Mother   . Stroke Father   . Heart disease Father   . Asthma Father   . Hypertension Father        pulmonary hypertension  . Heart disease Maternal Uncle   . Hypertension Maternal Grandmother   . COPD Maternal Grandmother   . Diabetes Maternal Grandfather   . Stroke Paternal Grandmother   . Diabetes Paternal Grandmother   . Diabetes Paternal Grandfather   . Leukemia Maternal Uncle     Social History Social History   Tobacco Use  . Smoking status: Never Smoker  . Smokeless tobacco: Never Used  Substance Use Topics  . Alcohol use: Yes    Comment: occasionally  . Drug use: No     Allergies   Concerta [methylphenidate], Terbinafine, Clindamycin hcl, Haldol [haloperidol], and Terbinafine hcl   Review of Systems Review of Systems  Constitutional: Positive for appetite change and fever.  HENT: Positive for sore throat. Negative for congestion.   Respiratory: Negative for shortness of breath.   Cardiovascular: Negative for chest pain.  Gastrointestinal: Negative for abdominal pain.  Musculoskeletal: Negative for back pain.  Skin: Negative for rash.  Neurological: Negative for weakness.     Physical Exam Updated Vital Signs BP 127/87 (  BP Location: Right Arm)   Pulse (!) 102   Temp 98.9 F (37.2 C) (Oral)   Resp 16   Ht 5\' 4"  (1.626 m)   Wt 80.7 kg   LMP 02/23/2019   SpO2 97%   BMI 30.54 kg/m   Physical Exam Vitals signs and nursing note reviewed.  HENT:     Head: Normocephalic.     Mouth/Throat:     Tonsils: Tonsillar exudate present.     Comments: Bilateral tonsillar swelling.  Mild amount of exudate on right tonsil.  No peritonsillar swelling. Cardiovascular:     Rate and Rhythm: Tachycardia present.     Comments: Mild tachycardia Pulmonary:      Breath sounds: No wheezing, rhonchi or rales.  Lymphadenopathy:     Cervical: Cervical adenopathy present.  Skin:    General: Skin is warm.     Capillary Refill: Capillary refill takes less than 2 seconds.  Neurological:     Mental Status: She is alert and oriented to person, place, and time.      ED Treatments / Results  Labs (all labs ordered are listed, but only abnormal results are displayed) Labs Reviewed  GROUP A STREP BY PCR - Abnormal; Notable for the following components:      Result Value   Group A Strep by PCR DETECTED (*)    All other components within normal limits  PREGNANCY, URINE    EKG None  Radiology No results found.  Procedures Procedures (including critical care time)  Medications Ordered in ED Medications  penicillin g benzathine (BICILLIN LA) 1200000 UNIT/2ML injection 1.2 Million Units (1.2 Million Units Intramuscular Given 03/01/19 1028)  dexamethasone (DECADRON) tablet 10 mg (10 mg Oral Given 03/01/19 1027)     Initial Impression / Assessment and Plan / ED Course  I have reviewed the triage vital signs and the nursing notes.  Pertinent labs & imaging results that were available during my care of the patient were reviewed by me and considered in my medical decision making (see chart for details).        Patient with sore throat.  Positive strep test.  Well-appearing.  Discussed with patient we will give Decadron and IM penicillin.  Discharge home.  No apparent peritonsillar abscess.  Final Clinical Impressions(s) / ED Diagnoses   Final diagnoses:  Strep pharyngitis    ED Discharge Orders    None       Davonna Belling, MD 03/01/19 1048

## 2019-03-01 NOTE — ED Notes (Signed)
ED Provider at bedside. 

## 2019-03-01 NOTE — ED Triage Notes (Signed)
Woke up yesterday with sore throat, chills, weakness and poor appetite. Fever of 101 yesterday. Denies vomiting.

## 2019-03-03 DIAGNOSIS — F4322 Adjustment disorder with anxiety: Secondary | ICD-10-CM | POA: Diagnosis not present

## 2019-03-09 ENCOUNTER — Telehealth: Payer: Self-pay | Admitting: Family Medicine

## 2019-03-09 NOTE — Telephone Encounter (Signed)
Attempted to reach the office, 3 times with no answer. The patient would like an appt any day but Thursday, with concerns for tape worms she ate some bad sushi. Please advise CB [phone]

## 2019-03-09 NOTE — Telephone Encounter (Signed)
Called 3 times and no answer, no VM to leave. Pt is needing an appt before Thursday.

## 2019-03-10 ENCOUNTER — Encounter: Payer: Self-pay | Admitting: Family Medicine

## 2019-03-10 ENCOUNTER — Other Ambulatory Visit: Payer: Self-pay

## 2019-03-10 ENCOUNTER — Ambulatory Visit (INDEPENDENT_AMBULATORY_CARE_PROVIDER_SITE_OTHER): Payer: 59 | Admitting: Family Medicine

## 2019-03-10 VITALS — BP 116/71 | HR 99 | Temp 98.5°F | Resp 18 | Ht 64.0 in | Wt 177.4 lb

## 2019-03-10 DIAGNOSIS — F250 Schizoaffective disorder, bipolar type: Secondary | ICD-10-CM | POA: Diagnosis not present

## 2019-03-10 DIAGNOSIS — Z3009 Encounter for other general counseling and advice on contraception: Secondary | ICD-10-CM | POA: Diagnosis not present

## 2019-03-10 DIAGNOSIS — E559 Vitamin D deficiency, unspecified: Secondary | ICD-10-CM

## 2019-03-10 DIAGNOSIS — J302 Other seasonal allergic rhinitis: Secondary | ICD-10-CM

## 2019-03-10 HISTORY — DX: Vitamin D deficiency, unspecified: E55.9

## 2019-03-10 LAB — CBC WITH DIFFERENTIAL/PLATELET
Basophils Absolute: 0.1 10*3/uL (ref 0.0–0.1)
Basophils Relative: 1.2 % (ref 0.0–3.0)
Eosinophils Absolute: 0.3 10*3/uL (ref 0.0–0.7)
Eosinophils Relative: 4.4 % (ref 0.0–5.0)
HCT: 38.7 % (ref 36.0–46.0)
Hemoglobin: 12.6 g/dL (ref 12.0–15.0)
Lymphocytes Relative: 39.6 % (ref 12.0–46.0)
Lymphs Abs: 2.3 10*3/uL (ref 0.7–4.0)
MCHC: 32.5 g/dL (ref 30.0–36.0)
MCV: 90.8 fl (ref 78.0–100.0)
Monocytes Absolute: 0.5 10*3/uL (ref 0.1–1.0)
Monocytes Relative: 8.6 % (ref 3.0–12.0)
Neutro Abs: 2.7 10*3/uL (ref 1.4–7.7)
Neutrophils Relative %: 46.2 % (ref 43.0–77.0)
Platelets: 332 10*3/uL (ref 150.0–400.0)
RBC: 4.26 Mil/uL (ref 3.87–5.11)
RDW: 13.9 % (ref 11.5–15.5)
WBC: 5.7 10*3/uL (ref 4.0–10.5)

## 2019-03-10 LAB — VITAMIN D 25 HYDROXY (VIT D DEFICIENCY, FRACTURES): VITD: 20.99 ng/mL — ABNORMAL LOW (ref 30.00–100.00)

## 2019-03-10 MED ORDER — CETIRIZINE HCL 5 MG PO TABS: 5.0000 mg | ORAL_TABLET | Freq: Every day | ORAL | 1 refills | Status: DC

## 2019-03-10 MED ORDER — NORGESTIMATE-ETH ESTRADIOL 0.25-35 MG-MCG PO TABS: 1.0000 | ORAL_TABLET | Freq: Every day | ORAL | 3 refills | Status: DC

## 2019-03-10 MED FILL — CETIRIZINE HCL 10 MG TABS: 10 | 200 days supply | Qty: 100 | Fill #0

## 2019-03-10 NOTE — Assessment & Plan Note (Signed)
Check labs 

## 2019-03-10 NOTE — Progress Notes (Signed)
Patient ID: [name], female    DOB: Jan 23, 1996  Age: 23 y.o. MRN: [id number]    Subjective:  Subjective  HPI [name] presents for increased appetite.   She thinks she has a tape worm.   She was recently put on zyprexa and then her weight started climbing.    Review of Systems  Constitutional: Positive for appetite change and unexpected weight change. Negative for diaphoresis and fatigue.  Eyes: Negative for pain, redness and visual disturbance.  Respiratory: Negative for cough, chest tightness, shortness of breath and wheezing.   Cardiovascular: Negative for chest pain, palpitations and leg swelling.  Endocrine: Negative for cold intolerance, heat intolerance, polydipsia, polyphagia and polyuria.  Genitourinary: Negative for difficulty urinating, dysuria and frequency.  Neurological: Negative for dizziness, light-headedness, numbness and headaches.    History Past Medical History:  Diagnosis Date  . ADHD (attention deficit hyperactivity disorder)   . Allergy   . Anxiety   . Bipolar disorder (HCC)   . Eczema 12/11/13    She has no past surgical history on file.   Her family history includes Asthma in her father and mother; COPD in her maternal grandmother; Diabetes in her maternal grandfather, paternal grandfather, and paternal grandmother; Heart disease in her father and maternal uncle; Hypertension in her father and maternal grandmother; Leukemia in her maternal uncle; Stroke in her father and paternal grandmother.She reports that she has never smoked. She has never used smokeless tobacco. She reports current alcohol use. She reports that she does not use drugs.  Current Outpatient Medications on File Prior to Visit  Medication Sig Dispense Refill  . fluticasone (FLONASE) 50 MCG/ACT nasal spray SPRAY 2 SPRAYS INTO EACH NOSTRIL EVERY DAY 16 g 2  . hydrOXYzine (VISTARIL) 25 MG capsule Take 25 mg by mouth.     Marland Kitchen. LORazepam (ATIVAN) 1 MG tablet Take 1 mg by mouth at  bedtime.    Marland Kitchen. OLANZapine (ZYPREXA) 20 MG tablet Take 1 tablet (20 mg total) by mouth at bedtime. 30 tablet 0   No current facility-administered medications on file prior to visit.      Objective:  Objective  Physical Exam Vitals signs and nursing note reviewed.  Constitutional:      Appearance: She is well-developed.  HENT:     Head: Normocephalic and atraumatic.  Eyes:     Conjunctiva/sclera: Conjunctivae normal.  Neck:     Musculoskeletal: Normal range of motion and neck supple.     Thyroid: No thyromegaly.     Vascular: No carotid bruit or JVD.  Cardiovascular:     Rate and Rhythm: Normal rate and regular rhythm.     Heart sounds: Normal heart sounds. No murmur.  Pulmonary:     Effort: Pulmonary effort is normal. No respiratory distress.     Breath sounds: Normal breath sounds. No wheezing or rales.  Chest:     Chest wall: No tenderness.  Neurological:     Mental Status: She is alert and oriented to person, place, and time.  Psychiatric:        Mood and Affect: Mood normal.        Behavior: Behavior normal.    BP 116/71 (BP Location: Left Arm, Patient Position: Sitting, Cuff Size: Normal)   Pulse 99   Temp 98.5 F (36.9 C) (Oral)   Resp 18   Ht 5\' 4"  (1.626 m)   Wt 177 lb 6.4 oz (80.5 kg)   LMP 02/23/2019   SpO2 98%   BMI  30.45 kg/m  Wt Readings from Last 3 Encounters:  03/10/19 177 lb 6.4 oz (80.5 kg)  03/01/19 177 lb 14.4 oz (80.7 kg)  09/30/18 152 lb (68.9 kg)     Lab Results  Component Value Date   WBC 5.7 03/10/2019   HGB 12.6 03/10/2019   HCT 38.7 03/10/2019   PLT 332.0 03/10/2019   GLUCOSE 68 (L) 09/30/2018   CHOL 182 03/11/2018   TRIG 49 03/11/2018   HDL 75 03/11/2018   LDLCALC 97 03/11/2018   ALT 14 09/30/2018   AST 14 09/30/2018   NA 139 09/30/2018   K 4.4 09/30/2018   CL 105 09/30/2018   CREATININE 0.74 09/30/2018   BUN 15 09/30/2018   CO2 27 09/30/2018   TSH 0.47 09/30/2018   HGBA1C 4.9 03/11/2018   MICROALBUR 0.6 08/02/2014     No results found.   Assessment & Plan:  Plan  I have changed [name]'s cetirizine. I am also having her maintain her fluticasone, LORazepam, hydrOXYzine, norgestimate-ethinyl estradiol, and OLANZapine.  Meds ordered this encounter  Medications  . cetirizine (ZYRTEC) 5 MG tablet    Sig: Take 1 tablet (5 mg total) by mouth daily.    Dispense:  90 tablet    Refill:  1  . norgestimate-ethinyl estradiol (SPRINTEC 28) 0.25-35 MG-MCG tablet    Sig: Take 1 tablet by mouth daily.    Dispense:  84 tablet    Refill:  3    Problem List Items Addressed This Visit      Unprioritized   Schizoaffective disorder ([place]) - Primary    Pt doing well with zyprexa=== but I do think this is the cause of her weight gain Pt is aware now for inc appetite with zyprexa and will try to be aware of it when eating and make wiser choices.        Relevant Medications   OLANZapine (ZYPREXA) 20 MG tablet   Vitamin D deficiency    Check labs       Relevant Orders   CBC with Differential/Platelet (Completed)   Vitamin D (25 hydroxy) (Completed)    Other Visit Diagnoses    Birth control counseling       Relevant Medications   norgestimate-ethinyl estradiol (SPRINTEC 28) 0.25-35 MG-MCG tablet   Seasonal allergies       Relevant Medications   cetirizine (ZYRTEC) 5 MG tablet      Follow-up: Return in about 6 months (around 09/10/2019), or if symptoms worsen or fail to improve.  Ann Held, DO

## 2019-03-10 NOTE — Patient Instructions (Signed)
Schizoaffective Disorder Schizoaffective disorder (ScAD) is a mental illness. It causes symptoms that are a mixture of a psychotic disorder (schizophrenia) and a mood (affective) disorder. A psychotic disorder involves losing touch with reality. ScAD usually occurs in cycles. Periods of severe symptoms may be followed by periods of less severe symptoms or improvement. The illness affects men and women equally, but it usually appears at an earlier age (teenage or early adult years) in men. People who have family members with schizophrenia, bipolar disorder, or ScAD are at higher risk of developing ScAD. ScAD may interfere with personal relationships or normal daily activities. People with ScAD are at a higher risk for:  Job loss.  Social aloneness (isolation).  Health problems.  Anxiety.  Substance use disorders.  Suicide. What are the causes? The exact cause of this condition is not known. What increases the risk? The following factors may make you more likely to develop this condition:  Problems during your mother's pregnancy and after your birth, such as: ? Your mother having the flu (influenza) during the second semester of the pregnancy. ? Exposure to drugs, alcohol, illnesses, or other poisons (toxins) before birth. ? Low birth weight.  A brain infection or viral infection.  Problems with brain structure or function.  Having family members with bipolar disorder, ScAD, or schizophrenia.  Substance abuse.  Having been diagnosed with a mental health condition in the past.  Being a victim of neglect or long-term (chronic) abuse. What are the signs or symptoms? At any one time, people with ScAD may have psychotic symptoms only or have both psychotic and affective symptoms. Psychotic symptoms may include:  Hearing, seeing, or feeling things that are not there (hallucinations).  Having fixed, false beliefs (delusions). The delusions usually include being attacked, harassed,  or plotted against (paranoid delusions).  Speaking in a way that makes no sense to others (disorganized speech).  Confusing or odd behavior.  Loss of motivation for normal daily activities, such as self-care.  Withdrawal from social contacts (social isolation).  Lack of emotions. Affective symptoms may include:  Symptoms similar to major depression, such as: ? Depressed mood. ? Loss of interest in activities that are usually pleasurable (anhedonia). ? Sleeping more or less than normal. ? Feeling worthless or excessively guilty. ? Lack of energy or motivation. ? Trouble concentrating. ? Eating more or less than usual. ? Thinking a lot about death or suicide.  Symptoms similar to bipolar mania. Bipolar mania refers to periods of severe elation, irritability, and high energy that are experienced by people who have bipolar disorder. These symptoms may include: ? Abnormally elevated or irritable mood. ? Abnormally increased energy or activity. ? More confidence than normal or feeling that you are able to do anything (grandiosity). ? Feeling rested after getting less sleep than normal. ? Being easily distracted. ? Talking more than usual or feeling pressure to keep talking. ? Feeling that your thoughts are racing. ? Engaging in high-risk activities. How is this diagnosed? ScAD is diagnosed through an assessment by your health care provider. Your health care provider may refer you to a mental health specialist for evaluation. The mental health specialist:  Will observe and ask questions about your thoughts, behavior, mood, and ability to function in daily life.  May ask questions about your medical history and use of drugs, including prescription medicines. Certain medical conditions and substances can cause symptoms that resemble ScAD.  May do blood tests and imaging tests. There are two types of ScAD:  Depressive   ScAD. This type is diagnosed when you have only depressive  symptoms.  Bipolar ScAD. This type is diagnosed if your affective symptoms are only manic or are a mixture of manic and depressive. How is this treated? ScAD is usually a lifelong (chronic) illness that requires long-term treatment. Treatment may include:  Medicine. Different types of medicine are used to treat ScAD. The exact combination depends on the type and severity of your symptoms. ? Antipsychotic medicine may be used to control psychotic symptoms such as delusions, paranoia, and hallucinations. ? Mood stabilizers may be used to balance the highs and lows of bipolar manic mood swings. ? Antidepressant medicines may be used to treat depressive symptoms.  Counseling or talk therapy. Individual, group, or family counseling may be helpful in providing education, support, and guidance. Many people with ScAD also benefit from social skills and job skills (vocational) training. A combination of medicine and counseling is usually best for managing the disorder over time. A procedure in which electricity is applied to the brain through the scalp (electroconvulsive therapy) may be used to treat people with severe manic symptoms who do not respond to medicine and counseling. Follow these instructions at home:   Take over-the-counter and prescription medicines only as told by your health care provider. Check with your health care provider before starting new medicines.  Surround yourself with people who care about you and can help you manage your condition.  Keep stress under control. Stress may make symptoms worse.  Avoid alcohol and drugs. They can affect how medicine works and make symptoms worse.  Keep all follow-up visits as told by your health care provider and counselor. This is important. Contact a health care provider if:  You are not able to take your medicines as prescribed.  Your symptoms get worse. Get help right away if:  You feel out of control.  You or others notice  warning signs of suicide, such as: ? Increased use of drugs or alcohol ? Expressing feelings of not having a purpose in life or feeling trapped, guilty, anxious, agitated, or hopeless. ? Withdrawing from friends and family. ? Showing uncontrolled anger, recklessness, and dramatic mood changes. ? Talking about suicide or searching for methods. If you ever feel like you may hurt yourself or others, or have thoughts about taking your own life, get help right away. You can go to your nearest emergency department or call:  Your local emergency services (911 in the [place]  A suicide crisis helpline, such as the National Suicide Prevention Lifeline at 1-800-273-8255. This is open 24 hours a day. Summary  Schizoaffective disorder causes symptoms that are a mixture of a psychotic disorder and a mood disorder.  A combination of medicine and counseling is usually best for managing the disorder over time.  People who have schizoaffective disorder are at risk for suicide. Get help right away if you or someone else notices warning signs of suicide. This information is not intended to replace advice given to you by your health care provider. Make sure you discuss any questions you have with your health care provider. Document Released: 12/10/2006 Document Revised: 07/12/2017 Document Reviewed: 05/11/2016 Elsevier Patient Education  2020 [employer]  

## 2019-03-10 NOTE — Assessment & Plan Note (Signed)
Pt doing well with zyprexa=== but I do think this is the cause of her weight gain Pt is aware now for inc appetite with zyprexa and will try to be aware of it when eating and make wiser choices.

## 2019-03-11 ENCOUNTER — Other Ambulatory Visit: Payer: Self-pay

## 2019-03-11 ENCOUNTER — Other Ambulatory Visit: Payer: Self-pay | Admitting: Family Medicine

## 2019-03-11 DIAGNOSIS — E559 Vitamin D deficiency, unspecified: Secondary | ICD-10-CM

## 2019-03-11 MED ORDER — VITAMIN D (ERGOCALCIFEROL) 1.25 MG (50000 UNIT) PO CAPS: 50000.0000 [IU] | ORAL_CAPSULE | ORAL | 3 refills | Status: DC

## 2019-03-11 MED FILL — VIT D2 1.25 MG (50,000 UNIT: 1.25 MG | 84 days supply | Qty: 12 | Fill #0

## 2019-03-23 MED FILL — TRIAMCINOLONE/CETAPHIL CRM: 0.1% | 30 days supply | Qty: 454 | Fill #0

## 2019-04-06 MED FILL — OLANZapine 20 MG TABS: 20 | 30 days supply | Qty: 30 | Fill #0

## 2019-05-04 MED FILL — OLANZapine 20 MG TABS: 20 | 30 days supply | Qty: 30 | Fill #0

## 2019-05-04 MED FILL — LORAZEPAM 1 MG TABS: 1 | 30 days supply | Qty: 120 | Fill #0

## 2019-05-04 MED FILL — hydrOXYzine HCL 25 MG TABS: 25 | 30 days supply | Qty: 60 | Fill #0

## 2019-05-08 ENCOUNTER — Ambulatory Visit (INDEPENDENT_AMBULATORY_CARE_PROVIDER_SITE_OTHER): Payer: 59 | Admitting: Medical

## 2019-05-08 ENCOUNTER — Other Ambulatory Visit: Payer: Self-pay

## 2019-05-08 ENCOUNTER — Encounter: Payer: Self-pay | Admitting: Medical

## 2019-05-08 VITALS — BP 100/50 | HR 90 | Temp 98.4°F | Resp 22 | Ht 64.0 in | Wt 188.0 lb

## 2019-05-08 DIAGNOSIS — R05 Cough: Secondary | ICD-10-CM | POA: Diagnosis not present

## 2019-05-08 DIAGNOSIS — R5383 Other fatigue: Secondary | ICD-10-CM

## 2019-05-08 DIAGNOSIS — Z20822 Contact with and (suspected) exposure to covid-19: Secondary | ICD-10-CM

## 2019-05-08 DIAGNOSIS — R059 Cough, unspecified: Secondary | ICD-10-CM

## 2019-05-08 DIAGNOSIS — R6889 Other general symptoms and signs: Secondary | ICD-10-CM | POA: Diagnosis not present

## 2019-05-08 DIAGNOSIS — R509 Fever, unspecified: Secondary | ICD-10-CM

## 2019-05-08 MED ORDER — BENZONATATE 100 MG PO CAPS: 100.0000 mg | ORAL_CAPSULE | Freq: Three times a day (TID) | ORAL | 0 refills | Status: DC | PRN

## 2019-05-08 NOTE — Progress Notes (Addendum)
Subjective:    Patient ID: [name], female    DOB: 1996-01-01, 23 y.o.   MRN: [id number]  HPI   Virtual Visit via Video Note  I connected with [name] on 05/12/19 at  1:20 PM EDT by a video enabled telemedicine application and verified that I am speaking with the correct person using two identifiers.  Location: Patient: home Provider: office   I discussed the limitations of evaluation and management by telemedicine and the availability of in person appointments. The patient expressed understanding and agreed to proceed.    I discussed the assessment and treatment plan with the patient. The patient was provided an opportunity to ask questions and all were answered. The patient agreed with the plan and demonstrated an understanding of the instructions.   The patient was advised to call back or seek an in-person evaluation if the symptoms worsen or if the condition fails to improve as anticipated.  I provided 25  minutes of non-face-to-face time during this encounter.   Mackie Pai, PA-C   Pt signs/symptoms below since Monday. No close contact with sick persons that she knows. Pt got back from [place]. Pt was visiting family. Pt was sick even before.    Review of Systems  Constitutional: Positive for fatigue and fever.  HENT: Negative for congestion, nosebleeds, sinus pressure, sore throat and tinnitus.   Respiratory: Positive for cough. Negative for shortness of breath and wheezing.        Dry cough yesterday but none today. Kept her up last night but none now. No wheezing.  Cardiovascular: Negative for chest pain and palpitations.  Gastrointestinal: Negative for abdominal pain, blood in stool, constipation, diarrhea and nausea.  Musculoskeletal: Negative for myalgias.  Skin: Negative for rash.  Neurological: Positive for dizziness.  Hematological: Negative for adenopathy. Does not bruise/bleed easily.    Past Medical History:  Diagnosis Date  .  ADHD (attention deficit hyperactivity disorder)   . Allergy   . Anxiety   . Bipolar disorder ([place])   . Eczema 12/11/13     Social History   Socioeconomic History  . Marital status: Single    Spouse name: Not on file  . Number of children: Not on file  . Years of education: Not on file  . Highest education level: Not on file  Occupational History  . Not on file  Social Needs  . Financial resource strain: Not on file  . Food insecurity    Worry: Not on file    Inability: Not on file  . Transportation needs    Medical: Not on file    Non-medical: Not on file  Tobacco Use  . Smoking status: Never Smoker  . Smokeless tobacco: Never Used  Substance and Sexual Activity  . Alcohol use: Yes    Comment: occasionally  . Drug use: No  . Sexual activity: Not Currently    Birth control/protection: Pill  Lifestyle  . Physical activity    Days per week: Not on file    Minutes per session: Not on file  . Stress: Not on file  Relationships  . Social Herbalist on phone: Not on file    Gets together: Not on file    Attends religious service: Not on file    Active member of club or organization: Not on file    Attends meetings of clubs or organizations: Not on file    Relationship status: Not on file  . Intimate partner violence  Fear of current or ex partner: Not on file    Emotionally abused: Not on file    Physically abused: Not on file    Forced sexual activity: Not on file  Other Topics Concern  . Not on file  Social History Narrative   Lives with mom in an apartment on the second floor.  No children.  Currently not working.  Education: college.     No past surgical history on file.  Family History  Problem Relation Age of Onset  . Asthma Mother   . Stroke Father   . Heart disease Father   . Asthma Father   . Hypertension Father        pulmonary hypertension  . Heart disease Maternal Uncle   . Hypertension Maternal Grandmother   . COPD Maternal  Grandmother   . Diabetes Maternal Grandfather   . Stroke Paternal Grandmother   . Diabetes Paternal Grandmother   . Diabetes Paternal Grandfather   . Leukemia Maternal Uncle     Allergies  Allergen Reactions  . Concerta [Methylphenidate] Other (See Comments)    hallucinations  . Terbinafine   . Clindamycin Hcl Rash  . Haldol [Haloperidol] Anxiety    Unable to seat still  . Terbinafine Hcl Nausea And Vomiting    Current Outpatient Medications on File Prior to Visit  Medication Sig Dispense Refill  . cetirizine (ZYRTEC) 5 MG tablet Take 1 tablet (5 mg total) by mouth daily. 90 tablet 1  . fluticasone (FLONASE) 50 MCG/ACT nasal spray SPRAY 2 SPRAYS INTO EACH NOSTRIL EVERY DAY 16 g 2  . hydrOXYzine (VISTARIL) 25 MG capsule Take 25 mg by mouth.     Marland Kitchen LORazepam (ATIVAN) 1 MG tablet Take 1 mg by mouth at bedtime.    . norgestimate-ethinyl estradiol (SPRINTEC 28) 0.25-35 MG-MCG tablet Take 1 tablet by mouth daily. 84 tablet 3  . OLANZapine (ZYPREXA) 20 MG tablet Take 1 tablet (20 mg total) by mouth at bedtime. 30 tablet 0  . Vitamin D, Ergocalciferol, (DRISDOL) 1.25 MG (50000 UT) CAPS capsule Take 1 capsule (50,000 Units total) by mouth every 7 (seven) days. 12 capsule 3   No current facility-administered medications on file prior to visit.     There were no vitals taken for this visit.      Objective:   Physical Exam  General-no acute distress, pleasant, oriented. Lungs- on inspection lungs appear unlabored. Neck- no tracheal deviation or jvd on inspection. Neuro- gross motor function appears intact.       Assessment & Plan:  You do have some symptoms of fatigue, cough x1 night, and slight dizziness.  For same you want to do as we approach weekend is to get you tested for covid at the drive-through test center.  I did send the order and want you to go now actively finished our phone call.  Hopefully we will get a quick turnaround time.  During the interim I want you to stay  at home until we get the results back and then discuss how you did over the weekend.  For cough, making benzonatate available.  If test results come back negative but your fatigue still persists then will need to talk to lab staff to see if we can do a work-up for fatigue.  However if you continue to have fever or other symptoms that could represent currently then might not be able to do lab work in our office.  Follow-up date to be to be determined after lab review.  Mackie Pai, PA-C

## 2019-05-08 NOTE — Patient Instructions (Addendum)
You do have some symptoms of fatigue, cough x1 night, and slight dizziness.  For same you want to do as we approach weekend is to get you tested for covid at the drive-through test center.  I did send the order and want you to go now actively finished our phone call.  Hopefully we will get a quick turnaround time.  During the interim I want you to stay at home until we get the results back and then discuss how you did over the weekend.  For cough, making benzonatate available.  If test results come back negative but your fatigue still persists then will need to talk to lab staff to see if we can do a work-up for fatigue.  However if you continue to have fever or other symptoms that could represent currently then might not be able to do lab work in our office.  Follow-up date to be to be determined after lab review.

## 2019-05-09 LAB — NOVEL CORONAVIRUS, NAA: SARS-CoV-2, NAA: NOT DETECTED

## 2019-05-11 MED FILL — BENZONATATE 100 MG CAPS: 100 | 10 days supply | Qty: 30 | Fill #0

## 2019-05-18 ENCOUNTER — Other Ambulatory Visit: Payer: Self-pay

## 2019-05-19 ENCOUNTER — Other Ambulatory Visit: Payer: Self-pay

## 2019-05-19 ENCOUNTER — Other Ambulatory Visit (HOSPITAL_COMMUNITY)
Admission: RE | Admit: 2019-05-19 | Discharge: 2019-05-19 | Disposition: A | Discharge status: Home / Self Care | Payer: 59 | Source: Ambulatory Visit | Attending: Family Medicine | Admitting: Family Medicine

## 2019-05-19 ENCOUNTER — Encounter: Payer: Self-pay | Admitting: Family Medicine

## 2019-05-19 ENCOUNTER — Ambulatory Visit: Payer: 59 | Admitting: Family Medicine

## 2019-05-19 VITALS — BP 110/68 | HR 82 | Temp 97.8°F | Resp 18 | Ht 64.0 in | Wt 187.0 lb

## 2019-05-19 DIAGNOSIS — Z23 Encounter for immunization: Secondary | ICD-10-CM

## 2019-05-19 DIAGNOSIS — E559 Vitamin D deficiency, unspecified: Secondary | ICD-10-CM | POA: Diagnosis not present

## 2019-05-19 DIAGNOSIS — F314 Bipolar disorder, current episode depressed, severe, without psychotic features: Secondary | ICD-10-CM

## 2019-05-19 DIAGNOSIS — R569 Unspecified convulsions: Secondary | ICD-10-CM | POA: Diagnosis not present

## 2019-05-19 DIAGNOSIS — R3129 Other microscopic hematuria: Secondary | ICD-10-CM

## 2019-05-19 DIAGNOSIS — R5383 Other fatigue: Secondary | ICD-10-CM | POA: Diagnosis not present

## 2019-05-19 DIAGNOSIS — F10931 Alcohol use, unspecified with withdrawal delirium: Secondary | ICD-10-CM

## 2019-05-19 DIAGNOSIS — F10231 Alcohol dependence with withdrawal delirium: Secondary | ICD-10-CM | POA: Diagnosis not present

## 2019-05-19 DIAGNOSIS — Z7251 High risk heterosexual behavior: Secondary | ICD-10-CM

## 2019-05-19 DIAGNOSIS — N760 Acute vaginitis: Secondary | ICD-10-CM | POA: Insufficient documentation

## 2019-05-19 LAB — POC URINALSYSI DIPSTICK (AUTOMATED)
Bilirubin, UA: NEGATIVE
Glucose, UA: NEGATIVE
Ketones, UA: NEGATIVE
Leukocytes, UA: NEGATIVE
Nitrite, UA: NEGATIVE
Protein, UA: NEGATIVE
Spec Grav, UA: 1.02 (ref 1.010–1.025)
Urobilinogen, UA: 0.2 E.U./dL
pH, UA: 6 (ref 5.0–8.0)

## 2019-05-19 LAB — POCT URINE PREGNANCY: Preg Test, Ur: NEGATIVE

## 2019-05-19 MED ORDER — VITAMIN D (ERGOCALCIFEROL) 1.25 MG (50000 UNIT) PO CAPS: 50000.0000 [IU] | ORAL_CAPSULE | ORAL | 3 refills | Status: DC

## 2019-05-19 NOTE — Progress Notes (Signed)
Patient ID: [name], female    DOB: Aug 12, 1996  Age: 23 y.o. MRN: [id number]    Subjective:  Subjective  HPI [name] presents for fatigue--- she wonders if her psych meds could cause this.  She is also c/o vaginitis.  Her ex boyfriend cheated on her and she wants std checked.  No other compliants.    Review of Systems  Constitutional: Positive for fatigue. Negative for activity change, appetite change, chills and fever.  Gastrointestinal: Negative for abdominal distention and abdominal pain.  Genitourinary: Positive for vaginal discharge. Negative for difficulty urinating, dyspareunia, dysuria, flank pain, frequency, genital sores, hematuria, menstrual problem, pelvic pain, urgency and vaginal pain.  Musculoskeletal: Negative for back pain.    History Past Medical History:  Diagnosis Date  . ADHD (attention deficit hyperactivity disorder)   . Allergy   . Anxiety   . Bipolar disorder (HCC)   . Eczema 12/11/13    She has no past surgical history on file.   Her family history includes Asthma in her father and mother; COPD in her maternal grandmother; Diabetes in her maternal grandfather, paternal grandfather, and paternal grandmother; Heart disease in her father and maternal uncle; Hypertension in her father and maternal grandmother; Leukemia in her maternal uncle; Stroke in her father and paternal grandmother.She reports that she has never smoked. She has never used smokeless tobacco. She reports current alcohol use. She reports that she does not use drugs.  Current Outpatient Medications on File Prior to Visit  Medication Sig Dispense Refill  . cetirizine (ZYRTEC) 5 MG tablet Take 1 tablet (5 mg total) by mouth daily. 90 tablet 1  . fluticasone (FLONASE) 50 MCG/ACT nasal spray SPRAY 2 SPRAYS INTO EACH NOSTRIL EVERY DAY 16 g 2  . hydrOXYzine (VISTARIL) 25 MG capsule Take 25 mg by mouth.     Marland Kitchen LORazepam (ATIVAN) 1 MG tablet Take 1 mg by mouth at bedtime.    .  norgestimate-ethinyl estradiol (SPRINTEC 28) 0.25-35 MG-MCG tablet Take 1 tablet by mouth daily. 84 tablet 3  . OLANZapine (ZYPREXA) 20 MG tablet Take 1 tablet (20 mg total) by mouth at bedtime. 30 tablet 0   No current facility-administered medications on file prior to visit.      Objective:  Objective  Physical Exam Vitals signs and nursing note reviewed.  Constitutional:      Appearance: She is well-developed.  HENT:     Head: Normocephalic and atraumatic.  Eyes:     Conjunctiva/sclera: Conjunctivae normal.  Neck:     Musculoskeletal: Normal range of motion and neck supple.     Thyroid: No thyromegaly.     Vascular: No carotid bruit or JVD.  Cardiovascular:     Rate and Rhythm: Normal rate and regular rhythm.     Heart sounds: Normal heart sounds. No murmur.  Pulmonary:     Effort: Pulmonary effort is normal. No respiratory distress.     Breath sounds: Normal breath sounds. No wheezing or rales.  Chest:     Chest wall: No tenderness.  Neurological:     Mental Status: She is alert and oriented to person, place, and time.    BP 110/68 (BP Location: Right Arm, Patient Position: Sitting, Cuff Size: Normal)   Pulse 82   Temp 97.8 F (36.6 C) (Temporal)   Resp 18   Ht 5\' 4"  (1.626 m)   Wt 187 lb (84.8 kg)   LMP 04/19/2019   SpO2 96%   BMI 32.10 kg/m  Wt  Readings from Last 3 Encounters:  05/19/19 187 lb (84.8 kg)  05/08/19 188 lb (85.3 kg)  03/10/19 177 lb 6.4 oz (80.5 kg)     Lab Results  Component Value Date   WBC 5.7 03/10/2019   HGB 12.6 03/10/2019   HCT 38.7 03/10/2019   PLT 332.0 03/10/2019   GLUCOSE 68 (L) 09/30/2018   CHOL 182 03/11/2018   TRIG 49 03/11/2018   HDL 75 03/11/2018   LDLCALC 97 03/11/2018   ALT 14 09/30/2018   AST 14 09/30/2018   NA 139 09/30/2018   K 4.4 09/30/2018   CL 105 09/30/2018   CREATININE 0.74 09/30/2018   BUN 15 09/30/2018   CO2 27 09/30/2018   TSH 0.47 09/30/2018   HGBA1C 4.9 03/11/2018   MICROALBUR 0.6 08/02/2014     No results found.   Assessment & Plan:  Plan  I have discontinued [name]. [name]'s benzonatate. I am also having her maintain her fluticasone, LORazepam, hydrOXYzine, cetirizine, norgestimate-ethinyl estradiol, OLANZapine, and Vitamin D (Ergocalciferol).  Meds ordered this encounter  Medications  . Vitamin D, Ergocalciferol, (DRISDOL) 1.25 MG (50000 UT) CAPS capsule    Sig: Take 1 capsule (50,000 Units total) by mouth every 7 (seven) days.    Dispense:  12 capsule    Refill:  3    Problem List Items Addressed This Visit      Unprioritized   Acute vaginitis - Primary    Check urine ancillary       Relevant Orders   Urine cytology ancillary only([name])   HIV antibody (with reflex) (Completed)   RPR (Completed)   HSV Type I/II IgG, IgMw/ reflex   Hepatitis, Acute   Delirium tremens ([name])   High risk heterosexual behavior    Check for std       Relevant Orders   POCT urine pregnancy (Completed)   POCT Urinalysis Dipstick (Automated) (Completed)   Seizure ([name])   Severe bipolar I disorder, current or most recent episode depressed ([name])    Per psych      Vitamin D deficiency    Check labs       Relevant Medications   Vitamin D, Ergocalciferol, (DRISDOL) 1.25 MG (50000 UT) CAPS capsule    Other Visit Diagnoses    Fatigue, unspecified type       Relevant Orders   Vitamin D (25 hydroxy)   CBC with Differential/Platelet   Vitamin B12   TSH   Need for influenza vaccination       Relevant Orders   Flu Vaccine QUAD 36+ mos IM (Completed)   Microscopic hematuria       Relevant Orders   Urine Culture      Follow-up: Return if symptoms worsen or fail to improve.  Ann Held, DO

## 2019-05-19 NOTE — Patient Instructions (Signed)
Vaginitis  Vaginitis is irritation and swelling (inflammation) of the vagina. It happens when normal bacteria and yeast in the vagina grow too much. There are many types of this condition. Treatment will depend on the type you have. Follow these instructions at home: Lifestyle  Keep your vagina area clean and dry. ? Avoid using soap. ? Rinse the area with water.  Do not do the following until your doctor says it is okay: ? Wash and clean out the vagina (douche). ? Use tampons. ? Have sex.  Wipe from front to back after going to the bathroom.  Let air reach your vagina. ? Wear cotton underwear. ? Do not wear: ? Underwear while you sleep. ? Tight pants. ? Thong underwear. ? Underwear or nylons without a cotton panel. ? Take off any wet clothing, such as bathing suits, as soon as possible.  Use gentle, non-scented products. Do not use things that can irritate the vagina, such as fabric softeners. Avoid the following products if they are scented: ? Feminine sprays. ? Detergents. ? Tampons. ? Feminine hygiene products. ? Soaps or bubble baths.  Practice safe sex and use condoms. General instructions  Take over-the-counter and prescription medicines only as told by your doctor.  If you were prescribed an antibiotic medicine, take or use it as told by your doctor. Do not stop taking or using the antibiotic even if you start to feel better.  Keep all follow-up visits as told by your doctor. This is important. Contact a doctor if:  You have pain in your belly.  You have a fever.  Your symptoms last for more than 2-3 days. Get help right away if:  You have a fever and your symptoms get worse all of a sudden. Summary  Vaginitis is irritation and swelling of the vagina. It can happen when the normal bacteria and yeast in the vagina grow too much. There are many types.  Treatment will depend on the type you have.  Do not douche, use tampons , or have sex until your health  care provider approves. When you can return to sex, practice safe sex and use condoms. This information is not intended to replace advice given to you by your health care provider. Make sure you discuss any questions you have with your health care provider. Document Released: 10/26/2008 Document Revised: 07/12/2017 Document Reviewed: 08/21/2016 Elsevier Patient Education  2020 Elsevier Inc.  

## 2019-05-20 DIAGNOSIS — N760 Acute vaginitis: Secondary | ICD-10-CM | POA: Insufficient documentation

## 2019-05-20 DIAGNOSIS — R569 Unspecified convulsions: Secondary | ICD-10-CM | POA: Insufficient documentation

## 2019-05-20 DIAGNOSIS — Z7251 High risk heterosexual behavior: Secondary | ICD-10-CM | POA: Insufficient documentation

## 2019-05-20 DIAGNOSIS — F10231 Alcohol dependence with withdrawal delirium: Secondary | ICD-10-CM

## 2019-05-20 DIAGNOSIS — F10931 Alcohol use, unspecified with withdrawal delirium: Secondary | ICD-10-CM | POA: Insufficient documentation

## 2019-05-20 HISTORY — DX: Unspecified convulsions: R56.9

## 2019-05-20 HISTORY — DX: Alcohol use, unspecified with withdrawal delirium: F10.931

## 2019-05-20 HISTORY — DX: Acute vaginitis: N76.0

## 2019-05-20 HISTORY — DX: High risk heterosexual behavior: Z72.51

## 2019-05-20 HISTORY — DX: Alcohol dependence with withdrawal delirium: F10.231

## 2019-05-20 LAB — CBC WITH DIFFERENTIAL/PLATELET
Basophils Absolute: 0.1 10*3/uL (ref 0.0–0.1)
Basophils Relative: 1.1 % (ref 0.0–3.0)
Eosinophils Absolute: 0.2 10*3/uL (ref 0.0–0.7)
Eosinophils Relative: 3.2 % (ref 0.0–5.0)
HCT: 38.3 % (ref 36.0–46.0)
Hemoglobin: 12.6 g/dL (ref 12.0–15.0)
Lymphocytes Relative: 39.1 % (ref 12.0–46.0)
Lymphs Abs: 2.4 10*3/uL (ref 0.7–4.0)
MCHC: 32.9 g/dL (ref 30.0–36.0)
MCV: 89.9 fl (ref 78.0–100.0)
Monocytes Absolute: 0.6 10*3/uL (ref 0.1–1.0)
Monocytes Relative: 9.6 % (ref 3.0–12.0)
Neutro Abs: 2.9 10*3/uL (ref 1.4–7.7)
Neutrophils Relative %: 47 % (ref 43.0–77.0)
Platelets: 314 10*3/uL (ref 150.0–400.0)
RBC: 4.26 Mil/uL (ref 3.87–5.11)
RDW: 13.9 % (ref 11.5–15.5)
WBC: 6.2 10*3/uL (ref 4.0–10.5)

## 2019-05-20 LAB — HSV TYPE I/II IGG, IGMW/ REFLEX
HSV 1 Glycoprotein G Ab, IgG: <0.91 index (ref 0.00–0.90)
HSV 1 IgM: <1:10 {titer}
HSV 2 IgG, Type Spec: 18.9 index — ABNORMAL HIGH (ref 0.00–0.90)
HSV 2 IgM: <1:10 {titer}

## 2019-05-20 LAB — VITAMIN D 25 HYDROXY (VIT D DEFICIENCY, FRACTURES): VITD: 22.72 ng/mL — ABNORMAL LOW (ref 30.00–100.00)

## 2019-05-20 LAB — HEPATITIS PANEL, ACUTE
Hep A IgM: NONREACTIVE
Hep B C IgM: NONREACTIVE
Hepatitis B Surface Ag: NONREACTIVE
Hepatitis C Ab: NONREACTIVE
SIGNAL TO CUT-OFF: 0.02 (ref <1.00)

## 2019-05-20 LAB — TSH: TSH: 1.41 u[IU]/mL (ref 0.35–4.50)

## 2019-05-20 LAB — VITAMIN B12: Vitamin B-12: 912 pg/mL — ABNORMAL HIGH (ref 211–911)

## 2019-05-20 LAB — RPR: RPR Ser Ql: NONREACTIVE

## 2019-05-20 LAB — HIV ANTIBODY (ROUTINE TESTING W REFLEX): HIV 1&2 Ab, 4th Generation: NONREACTIVE

## 2019-05-20 NOTE — Assessment & Plan Note (Signed)
Per psych 

## 2019-05-20 NOTE — Assessment & Plan Note (Signed)
Check urine ancillary 

## 2019-05-20 NOTE — Assessment & Plan Note (Signed)
Check for std

## 2019-05-20 NOTE — Assessment & Plan Note (Signed)
Check labs 

## 2019-05-21 LAB — URINE CULTURE
MICRO NUMBER:: 959315
Result:: NO GROWTH
SPECIMEN QUALITY:: ADEQUATE

## 2019-05-28 ENCOUNTER — Other Ambulatory Visit: Payer: Self-pay | Admitting: Family Medicine

## 2019-05-28 DIAGNOSIS — N760 Acute vaginitis: Secondary | ICD-10-CM

## 2019-05-28 MED ORDER — FLUCONAZOLE 150 MG PO TABS: ORAL_TABLET | ORAL | 0 refills | Status: DC

## 2019-05-29 ENCOUNTER — Encounter: Payer: Self-pay | Admitting: Family Medicine

## 2019-05-29 DIAGNOSIS — E559 Vitamin D deficiency, unspecified: Secondary | ICD-10-CM

## 2019-05-29 MED ORDER — VITAMIN D (ERGOCALCIFEROL) 1.25 MG (50000 UNIT) PO CAPS: 50000.0000 [IU] | ORAL_CAPSULE | ORAL | 3 refills | Status: DC

## 2019-05-29 MED FILL — VIT D2 1.25 MG (50,000 UNIT: 1.25 MG | 84 days supply | Qty: 12 | Fill #0

## 2019-05-29 MED FILL — FLUCONAZOLE 150 MG TABS: 150 | 4 days supply | Qty: 2 | Fill #0

## 2019-07-01 ENCOUNTER — Other Ambulatory Visit: Payer: Self-pay | Admitting: Family Medicine

## 2019-07-01 MED FILL — FLUTICASONE PROP 50 MCG SPR: 50 | 30 days supply | Qty: 16 | Fill #0

## 2019-07-03 ENCOUNTER — Other Ambulatory Visit: Payer: Self-pay

## 2019-07-03 DIAGNOSIS — Z20822 Contact with and (suspected) exposure to covid-19: Secondary | ICD-10-CM

## 2019-07-06 LAB — NOVEL CORONAVIRUS, NAA: SARS-CoV-2, NAA: NOT DETECTED

## 2019-07-07 ENCOUNTER — Other Ambulatory Visit: Payer: Self-pay | Admitting: Family Medicine

## 2019-07-07 ENCOUNTER — Encounter: Payer: Self-pay | Admitting: Neurology

## 2019-07-07 ENCOUNTER — Encounter: Payer: Self-pay | Admitting: Family Medicine

## 2019-07-07 DIAGNOSIS — F250 Schizoaffective disorder, bipolar type: Secondary | ICD-10-CM

## 2019-07-07 NOTE — Telephone Encounter (Signed)
She sees psych

## 2019-07-08 NOTE — Telephone Encounter (Signed)
Please advise. Last ordered by historical provider. Pt states in Mychart not currently seeing psychiatrics

## 2019-07-08 NOTE — Telephone Encounter (Signed)
Pt called in and stated she is no longer going to the psych Dr and would like to know If Dr Etter Sjogren would fill those?   Best number -[phone]

## 2019-07-12 ENCOUNTER — Other Ambulatory Visit: Payer: Self-pay | Admitting: Family Medicine

## 2019-07-12 DIAGNOSIS — F250 Schizoaffective disorder, bipolar type: Secondary | ICD-10-CM

## 2019-07-12 MED ORDER — OLANZAPINE 20 MG PO TABS: 20.0000 mg | ORAL_TABLET | Freq: Every day | ORAL | 0 refills | Status: DC

## 2019-07-12 MED ORDER — LORAZEPAM 1 MG PO TABS: 1.0000 mg | ORAL_TABLET | Freq: Every day | ORAL | 0 refills | Status: DC

## 2019-07-12 MED ORDER — HYDROXYZINE PAMOATE 25 MG PO CAPS: 25.0000 mg | ORAL_CAPSULE | ORAL | 0 refills | Status: DC

## 2019-07-12 NOTE — Telephone Encounter (Signed)
I will refill 1x but she needs to see psychiatry

## 2019-07-12 NOTE — Telephone Encounter (Signed)
I refilled 1x--- she has a psychiatrist and needs to keep seeing her

## 2019-07-13 ENCOUNTER — Other Ambulatory Visit: Payer: Self-pay | Admitting: *Deleted

## 2019-07-13 MED ORDER — HYDROXYZINE HCL 25 MG PO TABS: 25.0000 mg | ORAL_TABLET | Freq: Three times a day (TID) | ORAL | 0 refills | Status: DC | PRN

## 2019-07-13 MED FILL — LORAZEPAM 1 MG TABS: 1 | 7 days supply | Qty: 28 | Fill #0

## 2019-07-15 ENCOUNTER — Ambulatory Visit: Payer: 59 | Admitting: Neurology

## 2019-08-11 DIAGNOSIS — F3176 Bipolar disorder, in full remission, most recent episode depressed: Secondary | ICD-10-CM | POA: Diagnosis not present

## 2019-08-11 DIAGNOSIS — F3174 Bipolar disorder, in full remission, most recent episode manic: Secondary | ICD-10-CM | POA: Diagnosis not present

## 2019-08-11 DIAGNOSIS — F22 Delusional disorders: Secondary | ICD-10-CM | POA: Diagnosis not present

## 2019-08-11 MED FILL — OLANZapine 20 MG TABS: 20 | 30 days supply | Qty: 30 | Fill #0

## 2019-08-11 MED FILL — PHENTERMINE 37.5 MG TABLET: 37.5 | 30 days supply | Qty: 30 | Fill #0

## 2019-08-11 MED FILL — hydrOXYzine HCL 25 MG TABS: 25 | 30 days supply | Qty: 60 | Fill #0

## 2019-08-11 MED FILL — LORAZEPAM 1 MG TABS: 1 | 30 days supply | Qty: 120 | Fill #0

## 2019-08-12 ENCOUNTER — Encounter: Payer: Self-pay | Admitting: Family Medicine

## 2019-08-12 ENCOUNTER — Other Ambulatory Visit: Payer: Self-pay

## 2019-08-12 DIAGNOSIS — Z79899 Other long term (current) drug therapy: Secondary | ICD-10-CM

## 2019-08-20 ENCOUNTER — Other Ambulatory Visit (INDEPENDENT_AMBULATORY_CARE_PROVIDER_SITE_OTHER): Payer: 59

## 2019-08-20 ENCOUNTER — Other Ambulatory Visit: Payer: Self-pay

## 2019-08-20 ENCOUNTER — Other Ambulatory Visit: Payer: Self-pay | Admitting: Family Medicine

## 2019-08-20 DIAGNOSIS — E559 Vitamin D deficiency, unspecified: Secondary | ICD-10-CM | POA: Diagnosis not present

## 2019-08-20 DIAGNOSIS — Z79899 Other long term (current) drug therapy: Secondary | ICD-10-CM

## 2019-08-20 LAB — HEPATIC FUNCTION PANEL
ALT: 17 U/L (ref 0–35)
AST: 14 U/L (ref 0–37)
Albumin: 4.1 g/dL (ref 3.5–5.2)
Alkaline Phosphatase: 72 U/L (ref 39–117)
Bilirubin, Direct: 0.1 mg/dL (ref 0.0–0.3)
Total Bilirubin: 0.5 mg/dL (ref 0.2–1.2)
Total Protein: 6.9 g/dL (ref 6.0–8.3)

## 2019-08-20 LAB — COMPREHENSIVE METABOLIC PANEL
ALT: 17 U/L (ref 0–35)
AST: 14 U/L (ref 0–37)
Albumin: 4.1 g/dL (ref 3.5–5.2)
Alkaline Phosphatase: 72 U/L (ref 39–117)
BUN: 13 mg/dL (ref 6–23)
CO2: 25 mEq/L (ref 19–32)
Calcium: 9.2 mg/dL (ref 8.4–10.5)
Chloride: 104 mEq/L (ref 96–112)
Creatinine, Ser: 0.71 mg/dL (ref 0.40–1.20)
GFR: 120.84 mL/min (ref >60.00)
Glucose, Bld: 99 mg/dL (ref 70–99)
Potassium: 4.1 mEq/L (ref 3.5–5.1)
Sodium: 137 mEq/L (ref 135–145)
Total Bilirubin: 0.5 mg/dL (ref 0.2–1.2)
Total Protein: 6.9 g/dL (ref 6.0–8.3)

## 2019-08-20 LAB — LIPID PANEL
Cholesterol: 184 mg/dL (ref 0–200)
HDL: 60.7 mg/dL (ref >39.00)
LDL Cholesterol: 102 mg/dL — ABNORMAL HIGH (ref 0–99)
NonHDL: 123.65
Total CHOL/HDL Ratio: 3
Triglycerides: 107 mg/dL (ref 0.0–149.0)
VLDL: 21.4 mg/dL (ref 0.0–40.0)

## 2019-08-20 LAB — VITAMIN D 25 HYDROXY (VIT D DEFICIENCY, FRACTURES): VITD: 24.78 ng/mL — ABNORMAL LOW (ref 30.00–100.00)

## 2019-08-21 ENCOUNTER — Other Ambulatory Visit: Payer: Self-pay

## 2019-08-21 DIAGNOSIS — E559 Vitamin D deficiency, unspecified: Secondary | ICD-10-CM

## 2019-08-21 MED ORDER — VITAMIN D (ERGOCALCIFEROL) 1.25 MG (50000 UNIT) PO CAPS: 50000.0000 [IU] | ORAL_CAPSULE | ORAL | 3 refills | Status: DC

## 2019-08-21 MED FILL — VIT D2 1.25 MG (50,000 UNIT: 1.25 MG | 84 days supply | Qty: 12 | Fill #0

## 2019-09-03 ENCOUNTER — Encounter: Payer: Self-pay | Admitting: Family Medicine

## 2019-09-03 ENCOUNTER — Ambulatory Visit: Payer: 59 | Admitting: Family Medicine

## 2019-09-03 ENCOUNTER — Other Ambulatory Visit: Payer: Self-pay

## 2019-09-03 VITALS — BP 110/60 | HR 107 | Temp 97.3°F | Resp 18 | Ht 64.0 in | Wt 209.6 lb

## 2019-09-03 DIAGNOSIS — Z3200 Encounter for pregnancy test, result unknown: Secondary | ICD-10-CM | POA: Diagnosis not present

## 2019-09-03 LAB — POCT URINE PREGNANCY: Preg Test, Ur: NEGATIVE

## 2019-09-03 NOTE — Patient Instructions (Addendum)
COVID-19 Vaccine Information can be found at: [email or web address] For questions related to vaccine distribution or appointments, please email [email or web address] or call [phone].   Calorie Counting for Weight Loss Calories are units of energy. Your body needs a certain amount of calories from food to keep you going throughout the day. When you eat more calories than your body needs, your body stores the extra calories as fat. When you eat fewer calories than your body needs, your body burns fat to get the energy it needs. Calorie counting means keeping track of how many calories you eat and drink each day. Calorie counting can be helpful if you need to lose weight. If you make sure to eat fewer calories than your body needs, you should lose weight. Ask your health care provider what a healthy weight is for you. For calorie counting to work, you will need to eat the right number of calories in a day in order to lose a healthy amount of weight per week. A dietitian can help you determine how many calories you need in a day and will give you suggestions on how to reach your calorie goal.  A healthy amount of weight to lose per week is usually 1-2 lb (0.5-0.9 kg). This usually means that your daily calorie intake should be reduced by 500-750 calories.  Eating 1,200 - 1,500 calories per day can help most women lose weight.  Eating 1,500 - 1,800 calories per day can help most men lose weight. What is my plan? My goal is to have __________ calories per day. If I have this many calories per day, I should lose around __________ pounds per week. What do I need to know about calorie counting? In order to meet your daily calorie goal, you will need to:  Find out how many calories are in each food you would like to eat. Try to do this before you eat.  Decide how much of the food you plan to eat.  Write down what you ate and how many calories  it had. Doing this is called keeping a food log. To successfully lose weight, it is important to balance calorie counting with a healthy lifestyle that includes regular activity. Aim for 150 minutes of moderate exercise (such as walking) or 75 minutes of vigorous exercise (such as running) each week. Where do I find calorie information?  The number of calories in a food can be found on a Nutrition Facts label. If a food does not have a Nutrition Facts label, try to look up the calories online or ask your dietitian for help. Remember that calories are listed per serving. If you choose to have more than one serving of a food, you will have to multiply the calories per serving by the amount of servings you plan to eat. For example, the label on a package of bread might say that a serving size is 1 slice and that there are 90 calories in a serving. If you eat 1 slice, you will have eaten 90 calories. If you eat 2 slices, you will have eaten 180 calories. How do I keep a food log? Immediately after each meal, record the following information in your food log:  What you ate. Don't forget to include toppings, sauces, and other extras on the food.  How much you ate. This can be measured in cups, ounces, or number of items.  How many calories each food and drink had.  The total number of calories in the meal. Keep your   food log near you, such as in a small notebook in your pocket, or use a mobile app or website. Some programs will calculate calories for you and show you how many calories you have left for the day to meet your goal. What are some calorie counting tips?   Use your calories on foods and drinks that will fill you up and not leave you hungry: ? Some examples of foods that fill you up are nuts and nut butters, vegetables, lean proteins, and high-fiber foods like whole grains. High-fiber foods are foods with more than 5 g fiber per serving. ? Drinks such as sodas, specialty coffee drinks,  alcohol, and juices have a lot of calories, yet do not fill you up.  Eat nutritious foods and avoid empty calories. Empty calories are calories you get from foods or beverages that do not have many vitamins or protein, such as candy, sweets, and soda. It is better to have a nutritious high-calorie food (such as an avocado) than a food with few nutrients (such as a bag of chips).  Know how many calories are in the foods you eat most often. This will help you calculate calorie counts faster.  Pay attention to calories in drinks. Low-calorie drinks include water and unsweetened drinks.  Pay attention to nutrition labels for "low fat" or "fat free" foods. These foods sometimes have the same amount of calories or more calories than the full fat versions. They also often have added sugar, starch, or salt, to make up for flavor that was removed with the fat.  Find a way of tracking calories that works for you. Get creative. Try different apps or programs if writing down calories does not work for you. What are some portion control tips?  Know how many calories are in a serving. This will help you know how many servings of a certain food you can have.  Use a measuring cup to measure serving sizes. You could also try weighing out portions on a kitchen scale. With time, you will be able to estimate serving sizes for some foods.  Take some time to put servings of different foods on your favorite plates, bowls, and cups so you know what a serving looks like.  Try not to eat straight from a bag or box. Doing this can lead to overeating. Put the amount you would like to eat in a cup or on a plate to make sure you are eating the right portion.  Use smaller plates, glasses, and bowls to prevent overeating.  Try not to multitask (for example, watch TV or use your computer) while eating. If it is time to eat, sit down at a table and enjoy your food. This will help you to know when you are full. It will also  help you to be aware of what you are eating and how much you are eating. What are tips for following this plan? Reading food labels  Check the calorie count compared to the serving size. The serving size may be smaller than what you are used to eating.  Check the source of the calories. Make sure the food you are eating is high in vitamins and protein and low in saturated and trans fats. Shopping  Read nutrition labels while you shop. This will help you make healthy decisions before you decide to purchase your food.  Make a grocery list and stick to it. Cooking  Try to cook your favorite foods in a healthier way. For example, try   baking instead of frying.  Use low-fat dairy products. Meal planning  Use more fruits and vegetables. Half of your plate should be fruits and vegetables.  Include lean proteins like poultry and fish. How do I count calories when eating out?  Ask for smaller portion sizes.  Consider sharing an entree and sides instead of getting your own entree.  If you get your own entree, eat only half. Ask for a box at the beginning of your meal and put the rest of your entree in it so you are not tempted to eat it.  If calories are listed on the menu, choose the lower calorie options.  Choose dishes that include vegetables, fruits, whole grains, low-fat dairy products, and lean protein.  Choose items that are boiled, broiled, grilled, or steamed. Stay away from items that are buttered, battered, fried, or served with cream sauce. Items labeled "crispy" are usually fried, unless stated otherwise.  Choose water, low-fat milk, unsweetened iced tea, or other drinks without added sugar. If you want an alcoholic beverage, choose a lower calorie option such as a glass of wine or light beer.  Ask for dressings, sauces, and syrups on the side. These are usually high in calories, so you should limit the amount you eat.  If you want a salad, choose a garden salad and ask for  grilled meats. Avoid extra toppings like bacon, cheese, or fried items. Ask for the dressing on the side, or ask for olive oil and vinegar or lemon to use as dressing.  Estimate how many servings of a food you are given. For example, a serving of cooked rice is  cup or about the size of half a baseball. Knowing serving sizes will help you be aware of how much food you are eating at restaurants. The list below tells you how big or small some common portion sizes are based on everyday objects: ? 1 oz--4 stacked dice. ? 3 oz--1 deck of cards. ? 1 tsp--1 die. ? 1 Tbsp-- a ping-pong ball. ? 2 Tbsp--1 ping-pong ball. ?  cup-- baseball. ? 1 cup--1 baseball. Summary  Calorie counting means keeping track of how many calories you eat and drink each day. If you eat fewer calories than your body needs, you should lose weight.  A healthy amount of weight to lose per week is usually 1-2 lb (0.5-0.9 kg). This usually means reducing your daily calorie intake by 500-750 calories.  The number of calories in a food can be found on a Nutrition Facts label. If a food does not have a Nutrition Facts label, try to look up the calories online or ask your dietitian for help.  Use your calories on foods and drinks that will fill you up, and not on foods and drinks that will leave you hungry.  Use smaller plates, glasses, and bowls to prevent overeating. This information is not intended to replace advice given to you by your health care provider. Make sure you discuss any questions you have with your health care provider. Document Revised: 04/18/2018 Document Reviewed: 06/29/2016 Elsevier Patient Education  2020 Elsevier Inc.  

## 2019-09-04 DIAGNOSIS — F209 Schizophrenia, unspecified: Secondary | ICD-10-CM | POA: Diagnosis not present

## 2019-09-04 DIAGNOSIS — F3176 Bipolar disorder, in full remission, most recent episode depressed: Secondary | ICD-10-CM | POA: Diagnosis not present

## 2019-09-04 DIAGNOSIS — F3174 Bipolar disorder, in full remission, most recent episode manic: Secondary | ICD-10-CM | POA: Diagnosis not present

## 2019-09-04 LAB — HCG, SERUM, QUALITATIVE: Preg, Serum: NEGATIVE

## 2019-09-04 MED FILL — ADDERALL XR 20 MG CAP SA: 20 | 30 days supply | Qty: 30 | Fill #0

## 2019-09-06 ENCOUNTER — Encounter: Payer: Self-pay | Admitting: Family Medicine

## 2019-09-06 DIAGNOSIS — Z3200 Encounter for pregnancy test, result unknown: Secondary | ICD-10-CM

## 2019-09-06 HISTORY — DX: Morbid (severe) obesity due to excess calories: E66.01

## 2019-09-06 HISTORY — DX: Encounter for pregnancy test, result unknown: Z32.00

## 2019-09-06 NOTE — Progress Notes (Signed)
Patient ID: DANNY ZIMNY, female    DOB: Oct 05, 1995  Age: 24 y.o. MRN: [id number]    Subjective:  Subjective  HPI JAMETTA MOOREHEAD presents for pregnancy test and c/o weight gain.  No other complaints   Review of Systems  Constitutional: Positive for unexpected weight change. Negative for appetite change, diaphoresis and fatigue.  Eyes: Negative for pain, redness and visual disturbance.  Respiratory: Negative for cough, chest tightness, shortness of breath and wheezing.   Cardiovascular: Negative for chest pain, palpitations and leg swelling.  Endocrine: Negative for cold intolerance, heat intolerance, polydipsia, polyphagia and polyuria.  Genitourinary: Negative for difficulty urinating, dysuria and frequency.  Neurological: Negative for dizziness, light-headedness, numbness and headaches.    History Past Medical History:  Diagnosis Date  . ADHD (attention deficit hyperactivity disorder)   . Allergy   . Anxiety   . Bipolar disorder (HCC)   . Eczema 12/11/13    She has no past surgical history on file.   Her family history includes Asthma in her father and mother; COPD in her maternal grandmother; Diabetes in her maternal grandfather, paternal grandfather, and paternal grandmother; Heart disease in her father and maternal uncle; Hypertension in her father and maternal grandmother; Leukemia in her maternal uncle; Stroke in her father and paternal grandmother.She reports that she has never smoked. She has never used smokeless tobacco. She reports current alcohol use. She reports that she does not use drugs.  Current Outpatient Medications on File Prior to Visit  Medication Sig Dispense Refill  . cetirizine (ZYRTEC) 5 MG tablet Take 1 tablet (5 mg total) by mouth daily. 90 tablet 1  . fluticasone (FLONASE) 50 MCG/ACT nasal spray PLACE 2 SPRAYS INTO EACH NOSTRIL EVERY DAY 16 g 2  . hydrOXYzine (ATARAX/VISTARIL) 25 MG tablet Take 1 tablet (25 mg total) by mouth 3 (three) times daily  as needed for anxiety. 30 tablet 0  . hydrOXYzine (VISTARIL) 25 MG capsule Take 1 capsule (25 mg total) by mouth 30 (thirty) minutes before procedure for 1 dose. 30 capsule 0  . LORazepam (ATIVAN) 1 MG tablet Take 1 tablet (1 mg total) by mouth at bedtime. 30 tablet 0  . norgestimate-ethinyl estradiol (SPRINTEC 28) 0.25-35 MG-MCG tablet Take 1 tablet by mouth daily. 84 tablet 3  . OLANZapine (ZYPREXA) 20 MG tablet Take 1 tablet (20 mg total) by mouth at bedtime. 30 tablet 0  . Vitamin D, Ergocalciferol, (DRISDOL) 1.25 MG (50000 UT) CAPS capsule Take 1 capsule (50,000 Units total) by mouth every 7 (seven) days. 12 capsule 3  . fluconazole (DIFLUCAN) 150 MG tablet 1 po x1, may repeat in 3 days prn (Patient not taking: Reported on 09/03/2019) 2 tablet 0   No current facility-administered medications on file prior to visit.     Objective:  Objective  Physical Exam Vitals and nursing note reviewed.  Constitutional:      Appearance: She is well-developed.  HENT:     Head: Normocephalic and atraumatic.  Eyes:     Conjunctiva/sclera: Conjunctivae normal.  Neck:     Thyroid: No thyromegaly.     Vascular: No carotid bruit or JVD.  Cardiovascular:     Rate and Rhythm: Normal rate and regular rhythm.     Heart sounds: Normal heart sounds. No murmur.  Pulmonary:     Effort: Pulmonary effort is normal. No respiratory distress.     Breath sounds: Normal breath sounds. No wheezing or rales.  Chest:     Chest wall: No tenderness.  Musculoskeletal:     Cervical back: Normal range of motion and neck supple.  Neurological:     Mental Status: She is alert and oriented to person, place, and time.    BP 110/60 (BP Location: Right Arm, Patient Position: Sitting, Cuff Size: Normal)   Pulse (!) 107   Temp (!) 97.3 F (36.3 C) (Temporal)   Resp 18   Ht 5\' 4"  (1.626 m)   Wt 209 lb 9.6 oz (95.1 kg)   SpO2 97%   BMI 35.98 kg/m  Wt Readings from Last 3 Encounters:  09/03/19 209 lb 9.6 oz (95.1 kg)    05/19/19 187 lb (84.8 kg)  05/08/19 188 lb (85.3 kg)     Lab Results  Component Value Date   WBC 6.2 05/19/2019   HGB 12.6 05/19/2019   HCT 38.3 05/19/2019   PLT 314.0 05/19/2019   GLUCOSE 99 08/20/2019   CHOL 184 08/20/2019   TRIG 107.0 08/20/2019   HDL 60.70 08/20/2019   LDLCALC 102 (H) 08/20/2019   ALT 17 08/20/2019   ALT 17 08/20/2019   AST 14 08/20/2019   AST 14 08/20/2019   NA 137 08/20/2019   K 4.1 08/20/2019   CL 104 08/20/2019   CREATININE 0.71 08/20/2019   BUN 13 08/20/2019   CO2 25 08/20/2019   TSH 1.41 05/19/2019   HGBA1C 4.9 03/11/2018   MICROALBUR 0.6 08/02/2014    No results found.   Assessment & Plan:  Plan  I am having Cassell Clement maintain her cetirizine, norgestimate-ethinyl estradiol, fluconazole, fluticasone, OLANZapine, hydrOXYzine, LORazepam, hydrOXYzine, and Vitamin D (Ergocalciferol).  No orders of the defined types were placed in this encounter.   Problem List Items Addressed This Visit      Unprioritized   Morbid obesity ([employer])    Most likely due to medication--- she will d/c psych Refer to healthy weight and wellness      Relevant Orders   Amb Ref to Medical Weight Management   Possible pregnancy - Primary    preg test neg      Relevant Orders   POCT urine pregnancy (Completed)   hCG, serum, qualitative (Completed)      Follow-up: No follow-ups on file.  Ann Held, DO

## 2019-09-06 NOTE — Assessment & Plan Note (Signed)
Most likely due to medication--- she will d/c psych Refer to healthy weight and wellness

## 2019-09-06 NOTE — Assessment & Plan Note (Signed)
preg test neg

## 2019-09-08 MED FILL — OLANZapine 20 MG TABS: 20 | 30 days supply | Qty: 30 | Fill #1

## 2019-09-10 ENCOUNTER — Other Ambulatory Visit: Payer: Self-pay

## 2019-09-11 ENCOUNTER — Other Ambulatory Visit: Payer: Self-pay

## 2019-09-11 ENCOUNTER — Encounter: Payer: Self-pay | Admitting: Family Medicine

## 2019-09-11 ENCOUNTER — Ambulatory Visit: Payer: 59 | Admitting: Family Medicine

## 2019-09-11 DIAGNOSIS — F319 Bipolar disorder, unspecified: Secondary | ICD-10-CM | POA: Diagnosis not present

## 2019-09-11 DIAGNOSIS — F250 Schizoaffective disorder, bipolar type: Secondary | ICD-10-CM | POA: Diagnosis not present

## 2019-09-11 DIAGNOSIS — E559 Vitamin D deficiency, unspecified: Secondary | ICD-10-CM

## 2019-09-11 DIAGNOSIS — F988 Other specified behavioral and emotional disorders with onset usually occurring in childhood and adolescence: Secondary | ICD-10-CM

## 2019-09-11 NOTE — Assessment & Plan Note (Signed)
Per psych 

## 2019-09-11 NOTE — Patient Instructions (Signed)
COVID-19 Vaccine Information can be found at: [email or web address] For questions related to vaccine distribution or appointments, please email [email or web address] or call [phone].    

## 2019-09-11 NOTE — Assessment & Plan Note (Signed)
con't vita d Recheck around April

## 2019-09-11 NOTE — Progress Notes (Signed)
Patient ID: [name], female    DOB: 1996-04-24  Age: 24 y.o. MRN: [id number]    Subjective:  Subjective  HPI [name] presents for f/u from last visit and vita d No complaints.  Pt starting back on add meds with psych and is doing well   Review of Systems  Constitutional: Negative for appetite change, diaphoresis, fatigue and unexpected weight change.  Eyes: Negative for pain, redness and visual disturbance.  Respiratory: Negative for cough, chest tightness, shortness of breath and wheezing.   Cardiovascular: Negative for chest pain, palpitations and leg swelling.  Endocrine: Negative for cold intolerance, heat intolerance, polydipsia, polyphagia and polyuria.  Genitourinary: Negative for difficulty urinating, dysuria and frequency.  Neurological: Negative for dizziness, light-headedness, numbness and headaches.    History Past Medical History:  Diagnosis Date  . ADHD (attention deficit hyperactivity disorder)   . Allergy   . Anxiety   . Bipolar disorder (HCC)   . Eczema 12/11/13    She has no past surgical history on file.   Her family history includes Asthma in her father and mother; COPD in her maternal grandmother; Diabetes in her maternal grandfather, paternal grandfather, and paternal grandmother; Heart disease in her father and maternal uncle; Hypertension in her father and maternal grandmother; Leukemia in her maternal uncle; Stroke in her father and paternal grandmother.She reports that she has never smoked. She has never used smokeless tobacco. She reports current alcohol use. She reports that she does not use drugs.  Current Outpatient Medications on File Prior to Visit  Medication Sig Dispense Refill  . amphetamine-dextroamphetamine (ADDERALL) 20 MG tablet Take 20 mg by mouth daily.    . cetirizine (ZYRTEC) 5 MG tablet Take 1 tablet (5 mg total) by mouth daily. 90 tablet 1  . fluticasone (FLONASE) 50 MCG/ACT nasal spray PLACE 2 SPRAYS INTO EACH NOSTRIL  EVERY DAY 16 g 2  . hydrOXYzine (ATARAX/VISTARIL) 25 MG tablet Take 1 tablet (25 mg total) by mouth 3 (three) times daily as needed for anxiety. 30 tablet 0  . hydrOXYzine (VISTARIL) 25 MG capsule Take 1 capsule (25 mg total) by mouth 30 (thirty) minutes before procedure for 1 dose. 30 capsule 0  . LORazepam (ATIVAN) 1 MG tablet Take 1 tablet (1 mg total) by mouth at bedtime. 30 tablet 0  . norgestimate-ethinyl estradiol (SPRINTEC 28) 0.25-35 MG-MCG tablet Take 1 tablet by mouth daily. 84 tablet 3  . OLANZapine (ZYPREXA) 20 MG tablet Take 1 tablet (20 mg total) by mouth at bedtime. 30 tablet 0  . Vitamin D, Ergocalciferol, (DRISDOL) 1.25 MG (50000 UT) CAPS capsule Take 1 capsule (50,000 Units total) by mouth every 7 (seven) days. 12 capsule 3  . fluconazole (DIFLUCAN) 150 MG tablet 1 po x1, may repeat in 3 days prn (Patient not taking: Reported on 09/03/2019) 2 tablet 0   No current facility-administered medications on file prior to visit.     Objective:  Objective  Physical Exam Vitals and nursing note reviewed.  Constitutional:      Appearance: She is well-developed.  HENT:     Head: Normocephalic and atraumatic.  Eyes:     Conjunctiva/sclera: Conjunctivae normal.  Neck:     Thyroid: No thyromegaly.     Vascular: No carotid bruit or JVD.  Cardiovascular:     Rate and Rhythm: Normal rate and regular rhythm.     Heart sounds: Normal heart sounds. No murmur.  Pulmonary:     Effort: Pulmonary effort is normal. No respiratory  distress.     Breath sounds: Normal breath sounds. No wheezing or rales.  Chest:     Chest wall: No tenderness.  Musculoskeletal:     Cervical back: Normal range of motion and neck supple.  Neurological:     Mental Status: She is alert and oriented to person, place, and time.    BP 122/60 (BP Location: Right Arm, Patient Position: Sitting, Cuff Size: Large)   Pulse (!) 111   Temp (!) 97.1 F (36.2 C) (Temporal)   Resp 18   Ht 5\' 4"  (1.626 m)   Wt 211 lb  (95.7 kg)   SpO2 98%   BMI 36.22 kg/m  Wt Readings from Last 3 Encounters:  09/11/19 211 lb (95.7 kg)  09/03/19 209 lb 9.6 oz (95.1 kg)  05/19/19 187 lb (84.8 kg)     Lab Results  Component Value Date   WBC 6.2 05/19/2019   HGB 12.6 05/19/2019   HCT 38.3 05/19/2019   PLT 314.0 05/19/2019   GLUCOSE 99 08/20/2019   CHOL 184 08/20/2019   TRIG 107.0 08/20/2019   HDL 60.70 08/20/2019   LDLCALC 102 (H) 08/20/2019   ALT 17 08/20/2019   ALT 17 08/20/2019   AST 14 08/20/2019   AST 14 08/20/2019   NA 137 08/20/2019   K 4.1 08/20/2019   CL 104 08/20/2019   CREATININE 0.71 08/20/2019   BUN 13 08/20/2019   CO2 25 08/20/2019   TSH 1.41 05/19/2019   HGBA1C 4.9 03/11/2018   MICROALBUR 0.6 08/02/2014    No results found.   Assessment & Plan:  Plan  I am having [name] maintain her cetirizine, norgestimate-ethinyl estradiol, fluconazole, fluticasone, OLANZapine, hydrOXYzine, LORazepam, hydrOXYzine, Vitamin D (Ergocalciferol), and amphetamine-dextroamphetamine.  No orders of the defined types were placed in this encounter.   Problem List Items Addressed This Visit      Unprioritized   ADD (attention deficit disorder)    Per psych      Bipolar disorder [facility])    Per psych      Schizoaffective disorder [facility])    Per psych      Vitamin D deficiency    con't vita d Recheck around April         Follow-up: Return if symptoms worsen or fail to improve.  Ann Held, DO

## 2019-09-23 DIAGNOSIS — H52223 Regular astigmatism, bilateral: Secondary | ICD-10-CM | POA: Diagnosis not present

## 2019-09-23 DIAGNOSIS — H5213 Myopia, bilateral: Secondary | ICD-10-CM | POA: Diagnosis not present

## 2019-09-28 DIAGNOSIS — F209 Schizophrenia, unspecified: Secondary | ICD-10-CM | POA: Diagnosis not present

## 2019-09-28 MED FILL — CAPLYTA 42 MG CAPS: 42 | 30 days supply | Qty: 30 | Fill #0

## 2019-10-20 MED FILL — PHENTERMINE 37.5 MG TABLET: 37.5 | 30 days supply | Qty: 30 | Fill #1

## 2019-10-28 IMAGING — MR MR HEAD W/O CM
10 series · 43 of 48 positions shown · IV contrast (Yes)
Comparison: None available.

CLINICAL DATA: Intractable drug injuries to headache, not elsewhere
classified. Possible drug overdose.

EXAM:
MRI HEAD WITHOUT CONTRAST
TECHNIQUE: Multiplanar, multiecho pulse sequences of the brain and surrounding
structures were obtained without intravenous contrast.

[Series 3: T1 · sagittal · 5.0mm · 0.47mm/px · 2 of 22 slices shown]
[im 1/22]
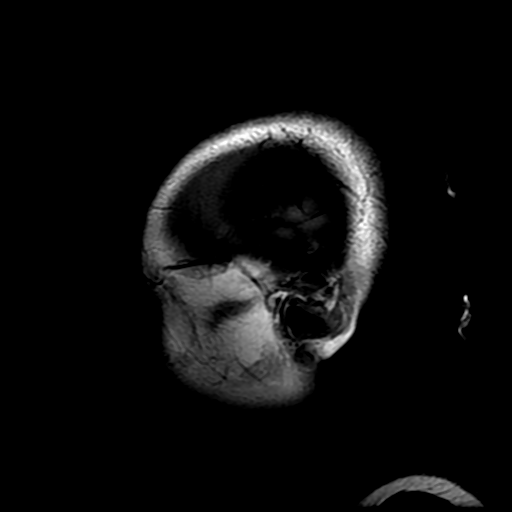
[im 22/22]
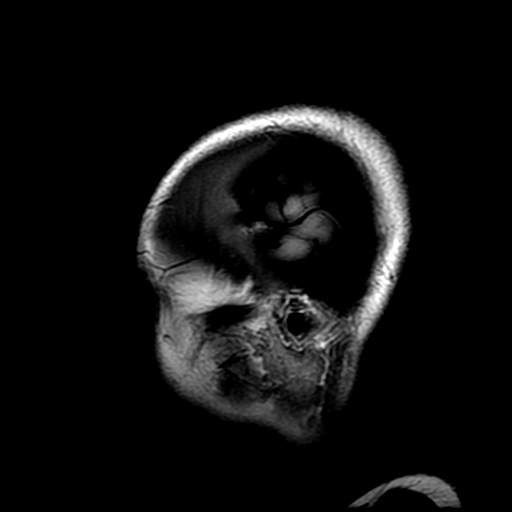

[Series 4: DWI · axial · 4.0mm · 1.17mm/px · z∈[-64,+67]mm · 8 of 62 slices shown (1 of 4)]
[im 1/62]
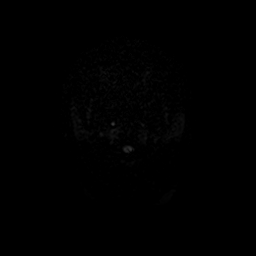
[im 9/62]
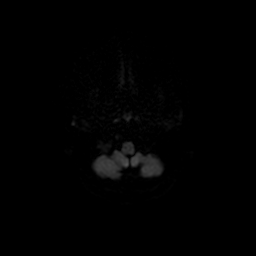
[im 18/62]
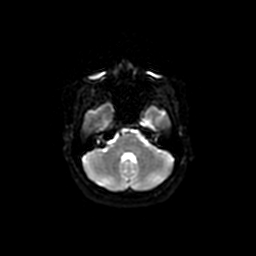
[im 27/62]
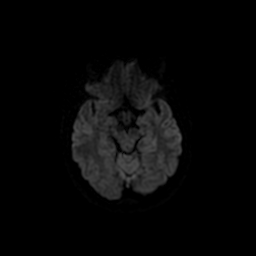
[im 35/62]
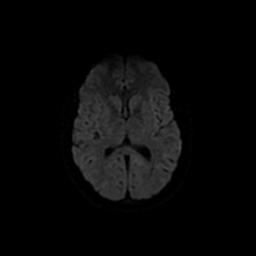
[im 44/62]
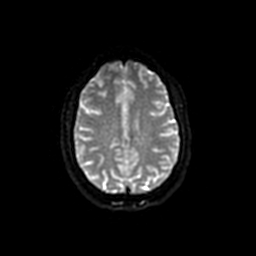
[im 53/62]
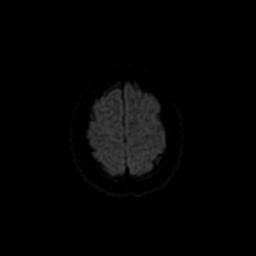
[im 62/62]
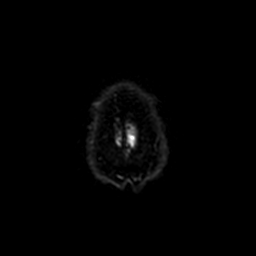

[Series 5: T2 · axial · 5.0mm · 0.86mm/px · z∈[-55,+81]mm · 3 of 21 slices shown (1 of 2)]
[im 1/21]
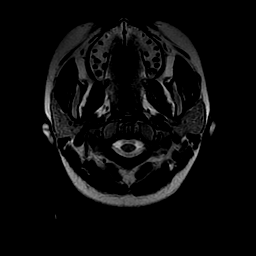
[im 11/21]
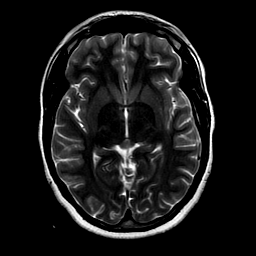
[im 21/21]
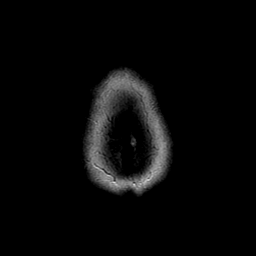

[Series 6: FLAIR · axial · 5.0mm · 0.43mm/px · z∈[-55,+81]mm · 3 of 21 slices shown]
[im 1/21]
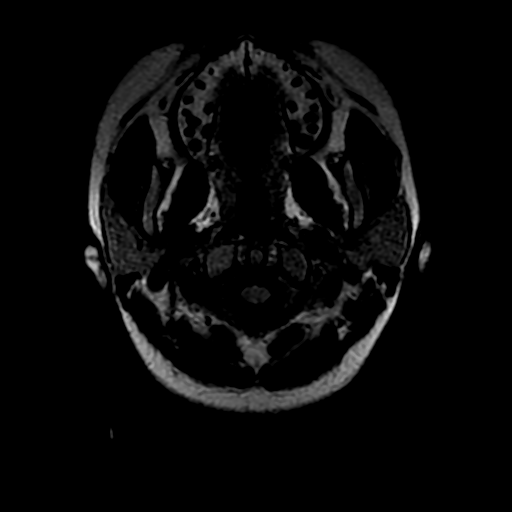
[im 11/21]
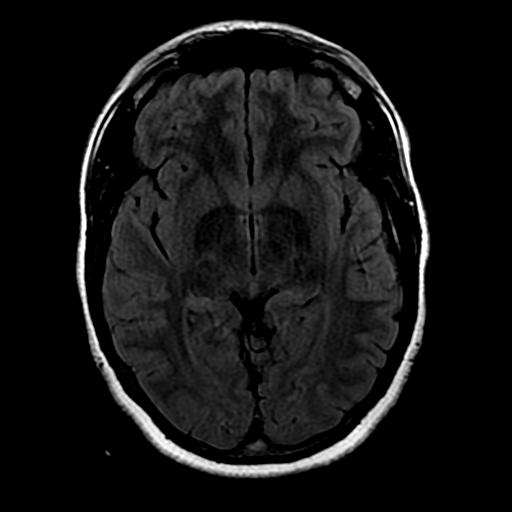
[im 21/21]
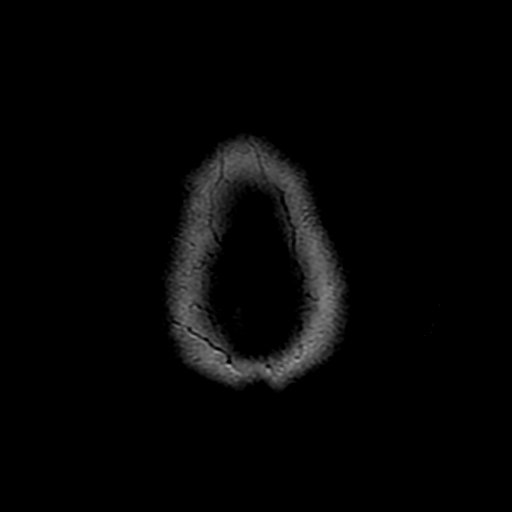

[Series 7: ax mpgr · axial · 5.0mm · 0.43mm/px · z∈[-55,+81]mm · 3 of 21 slices shown]
[im 1/21]
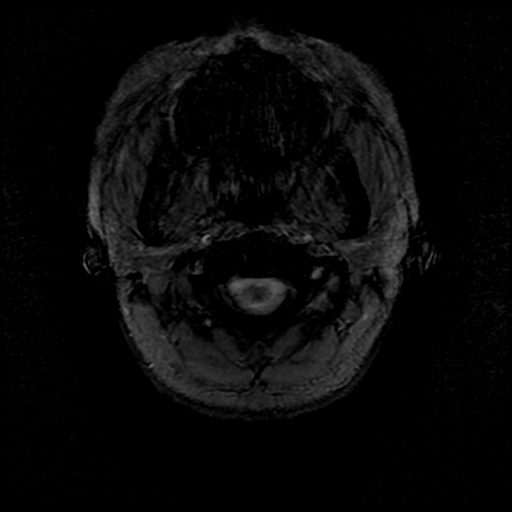
[im 11/21]
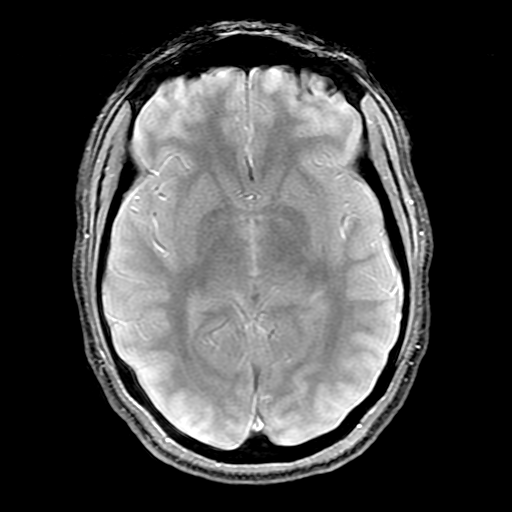
[im 21/21]
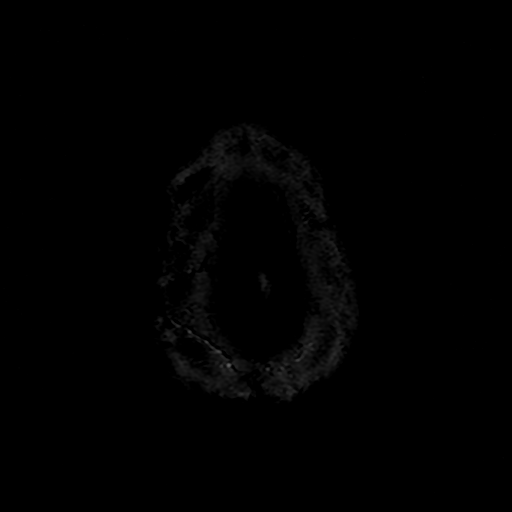

[Series 8: ax fspgr irp · axial · 3.0mm · 0.47mm/px · 1 of 46 slices shown]
[im 1/46]
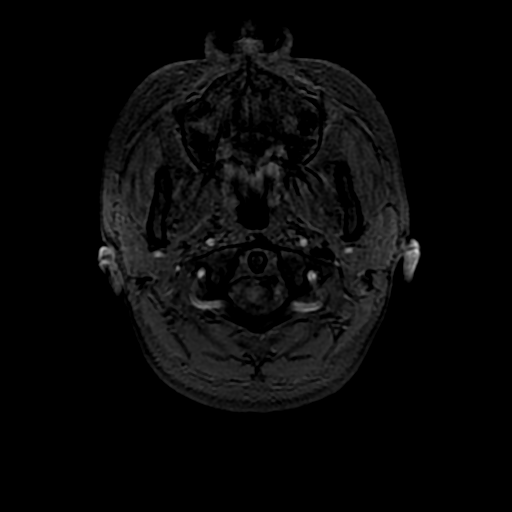

[Series 9: T2 · coronal · 5.0mm · 0.90mm/px · 3 of 27 slices shown (2 of 2)]
[im 1/27]
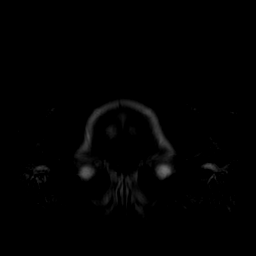
[im 14/27]
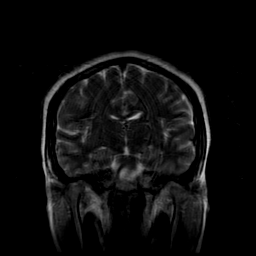
[im 27/27]
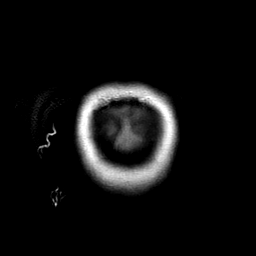

[Series 10: DWI · coronal · 4.0mm · 1.09mm/px · 11 of 86 slices shown (2 of 4)]
[im 1/86]
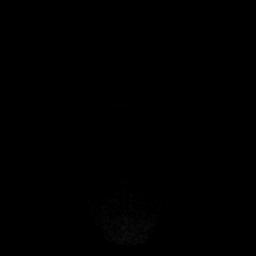
[im 9/86]
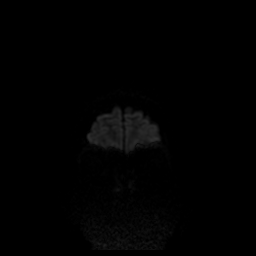
[im 18/86]
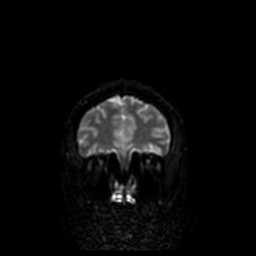
[im 26/86]
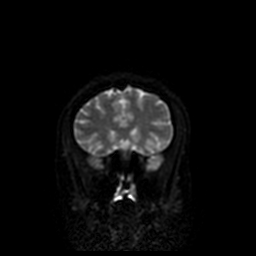
[im 35/86]
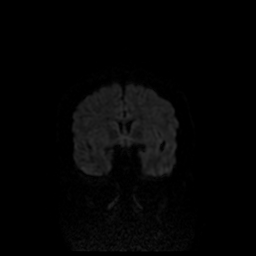
[im 43/86]
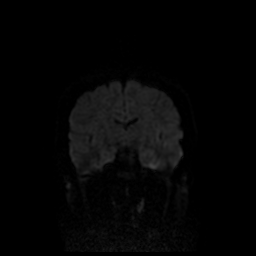
[im 52/86]
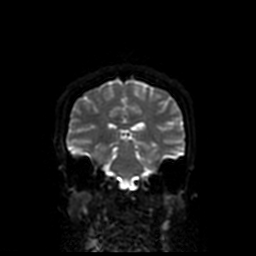
[im 60/86]
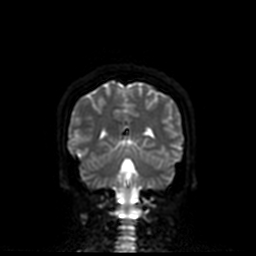
[im 69/86]
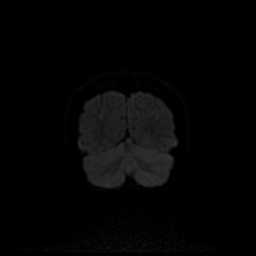
[im 77/86]
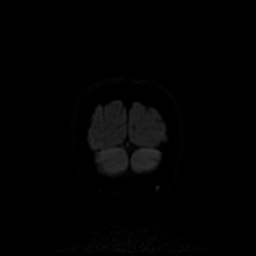
[im 86/86]
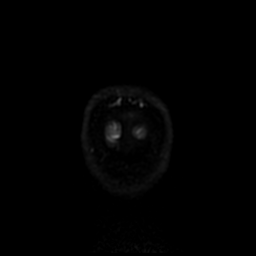

[Series 400: DWI · axial · 4.0mm · 1.17mm/px · z∈[-64,+67]mm · 4 of 31 slices shown (3 of 4)]
[im 1/31]
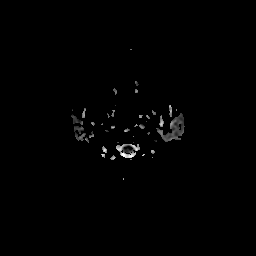
[im 11/31]
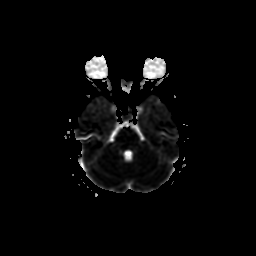
[im 21/31]
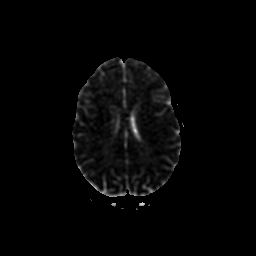
[im 31/31]
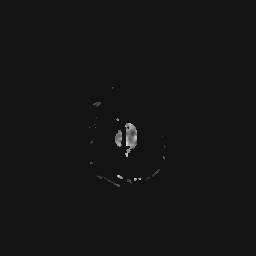

[Series 1000: DWI · coronal · 4.0mm · 1.09mm/px · 5 of 43 slices shown (4 of 4)]
[im 1/43]
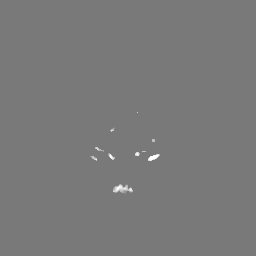
[im 11/43]
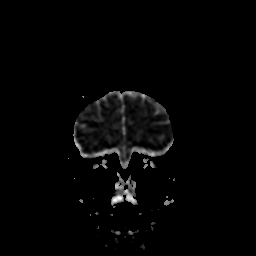
[im 22/43]
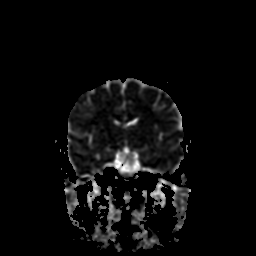
[im 32/43]
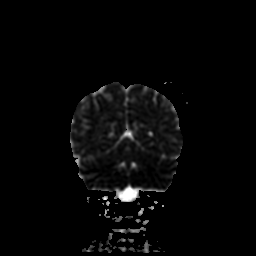
[im 43/43]
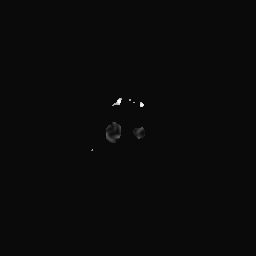

[43 of 48 positions shown; findings below may reference images not displayed]

FINDINGS: Brain: No acute infarct, hemorrhage, or mass lesion is present. No
significant extraaxial fluid collection is present. No significant
white matter disease is present. The internal auditory canals are
within normal limits bilaterally. Brainstem and cerebellum are
normal.

Vascular: Flow is present in the major intracranial arteries.

Skull and upper cervical spine: Skull base is within normal limits.
Craniocervical junction is normal. The upper cervical spine is
normal. Marrow signal is within normal limits.

Sinuses/Orbits: The paranasal sinuses and mastoid air cells are
clear. Globes and orbits are within normal limits.
IMPRESSION: Negative MRI of the brain.

## 2019-10-29 MED FILL — CAPLYTA 42 MG CAPS: 42 | 30 days supply | Qty: 30 | Fill #1

## 2019-10-29 MED FILL — OLANZapine 20 MG TABS: 20 | 30 days supply | Qty: 30 | Fill #2

## 2019-11-11 DIAGNOSIS — F209 Schizophrenia, unspecified: Secondary | ICD-10-CM | POA: Diagnosis not present

## 2019-11-18 MED FILL — LORazepam 1 MG TABS: 1 | 30 days supply | Qty: 120 | Fill #1

## 2019-11-19 MED FILL — PHENTERMINE 37.5 MG TABLET: 37.5 | 30 days supply | Qty: 30 | Fill #2

## 2019-11-20 ENCOUNTER — Ambulatory Visit: Payer: 59 | Attending: Internal Medicine

## 2019-11-23 MED FILL — CAPLYTA 42 MG CAPS: 42 | 30 days supply | Qty: 30 | Fill #2

## 2019-11-30 DIAGNOSIS — F3176 Bipolar disorder, in full remission, most recent episode depressed: Secondary | ICD-10-CM | POA: Diagnosis not present

## 2019-11-30 DIAGNOSIS — F4322 Adjustment disorder with anxiety: Secondary | ICD-10-CM | POA: Diagnosis not present

## 2019-11-30 DIAGNOSIS — F3174 Bipolar disorder, in full remission, most recent episode manic: Secondary | ICD-10-CM | POA: Diagnosis not present

## 2019-12-03 ENCOUNTER — Ambulatory Visit: Payer: 59

## 2019-12-04 ENCOUNTER — Ambulatory Visit: Payer: 59 | Attending: Internal Medicine

## 2019-12-04 DIAGNOSIS — F3112 Bipolar disorder, current episode manic without psychotic features, moderate: Secondary | ICD-10-CM | POA: Diagnosis not present

## 2019-12-04 DIAGNOSIS — F23 Brief psychotic disorder: Secondary | ICD-10-CM | POA: Diagnosis not present

## 2019-12-04 DIAGNOSIS — Z23 Encounter for immunization: Secondary | ICD-10-CM

## 2019-12-04 MED FILL — QUEtiapine FUMARATE ER 400: 400 | 14 days supply | Qty: 14 | Fill #0

## 2019-12-04 NOTE — Progress Notes (Signed)
   Covid-19 Vaccination Clinic  Name:  [name]    MRN: [id number] DOB: 07-Jul-1996  12/04/2019  [name] was observed post Covid-19 immunization for 15 minutes without incident. She was provided with Vaccine Information Sheet and instruction to access the V-Safe system.   [name] was instructed to call 911 with any severe reactions post vaccine: Marland Kitchen Difficulty breathing  . Swelling of face and throat  . A fast heartbeat  . A bad rash all over body  . Dizziness and weakness   Immunizations Administered    Name Date Dose VIS Date Route   Pfizer COVID-19 Vaccine 12/04/2019 10:02 AM 0.3 mL 10/07/2018 Intramuscular   Manufacturer: [employer], [employer]   Lot: [id number]   NDC: [address]-1

## 2019-12-07 MED FILL — LORazepam 1 MG TABS: 1 | 30 days supply | Qty: 120 | Fill #2

## 2019-12-10 MED FILL — QUEtiapine FUMARATE ER 400: 400 | 30 days supply | Qty: 60 | Fill #0

## 2019-12-21 ENCOUNTER — Ambulatory Visit: Payer: 59 | Admitting: Family Medicine

## 2019-12-21 DIAGNOSIS — Z0289 Encounter for other administrative examinations: Secondary | ICD-10-CM

## 2019-12-28 ENCOUNTER — Ambulatory Visit: Payer: 59 | Attending: Internal Medicine

## 2019-12-28 DIAGNOSIS — Z23 Encounter for immunization: Secondary | ICD-10-CM

## 2019-12-28 NOTE — Progress Notes (Signed)
   Covid-19 Vaccination Clinic  Name:  [name]    MRN: [id number] DOB: [birth year]  12/28/2019  [name] was observed post Covid-19 immunization for 15 minutes without incident. She was provided with Vaccine Information Sheet and instruction to access the V-Safe system.   [name] was instructed to call 911 with any severe reactions post vaccine: Marland Kitchen Difficulty breathing  . Swelling of face and throat  . A fast heartbeat  . A bad rash all over body  . Dizziness and weakness   Immunizations Administered    Name Date Dose VIS Date Route   Pfizer COVID-19 Vaccine 12/28/2019  8:34 AM 0.3 mL 10/07/2018 Intramuscular   Manufacturer: [employer], [employer]   Lot: [id number]   NDC: [address]-2

## 2019-12-29 DIAGNOSIS — F3111 Bipolar disorder, current episode manic without psychotic features, mild: Secondary | ICD-10-CM | POA: Diagnosis not present

## 2019-12-29 DIAGNOSIS — F251 Schizoaffective disorder, depressive type: Secondary | ICD-10-CM | POA: Diagnosis not present

## 2019-12-29 MED FILL — OXcarbazepine 150 MG TABS: 150 | 30 days supply | Qty: 90 | Fill #0

## 2020-01-07 MED FILL — QUEtiapine FUMARATE ER 400: 400 | 30 days supply | Qty: 60 | Fill #1

## 2020-02-08 MED FILL — QUEtiapine FUMARATE ER 400: 400 | 30 days supply | Qty: 60 | Fill #2

## 2020-02-15 DIAGNOSIS — F3112 Bipolar disorder, current episode manic without psychotic features, moderate: Secondary | ICD-10-CM | POA: Diagnosis not present

## 2020-02-15 DIAGNOSIS — F209 Schizophrenia, unspecified: Secondary | ICD-10-CM | POA: Diagnosis not present

## 2020-02-23 DIAGNOSIS — F4322 Adjustment disorder with anxiety: Secondary | ICD-10-CM | POA: Diagnosis not present

## 2020-02-25 DIAGNOSIS — D519 Vitamin B12 deficiency anemia, unspecified: Secondary | ICD-10-CM | POA: Diagnosis not present

## 2020-02-25 DIAGNOSIS — E785 Hyperlipidemia, unspecified: Secondary | ICD-10-CM | POA: Diagnosis not present

## 2020-02-25 DIAGNOSIS — R7309 Other abnormal glucose: Secondary | ICD-10-CM | POA: Diagnosis not present

## 2020-02-25 DIAGNOSIS — Z79899 Other long term (current) drug therapy: Secondary | ICD-10-CM | POA: Diagnosis not present

## 2020-02-25 DIAGNOSIS — Z130 Encounter for screening for diseases of the blood and blood-forming organs and certain disorders involving the immune mechanism: Secondary | ICD-10-CM | POA: Diagnosis not present

## 2020-02-25 DIAGNOSIS — E559 Vitamin D deficiency, unspecified: Secondary | ICD-10-CM | POA: Diagnosis not present

## 2020-02-25 DIAGNOSIS — Z1329 Encounter for screening for other suspected endocrine disorder: Secondary | ICD-10-CM | POA: Diagnosis not present

## 2020-03-04 ENCOUNTER — Other Ambulatory Visit (HOSPITAL_BASED_OUTPATIENT_CLINIC_OR_DEPARTMENT_OTHER): Payer: Self-pay | Admitting: Specialist

## 2020-03-04 DIAGNOSIS — F209 Schizophrenia, unspecified: Secondary | ICD-10-CM | POA: Diagnosis not present

## 2020-03-04 MED FILL — VIT D3-50 50,000 UNITS CAPS: 1.25 MG | 84 days supply | Qty: 24 | Fill #0

## 2020-03-08 MED FILL — FLUPHENAZINE HCL 1 MG TABS: 1 | 30 days supply | Qty: 30 | Fill #0

## 2020-03-09 MED FILL — QUEtiapine FUMARATE ER 400: 400 | 30 days supply | Qty: 60 | Fill #0

## 2020-04-08 MED FILL — QUEtiapine FUMARATE ER 400: 400 | 30 days supply | Qty: 60 | Fill #1

## 2020-04-28 DIAGNOSIS — F23 Brief psychotic disorder: Secondary | ICD-10-CM | POA: Diagnosis not present

## 2020-04-28 DIAGNOSIS — F209 Schizophrenia, unspecified: Secondary | ICD-10-CM | POA: Diagnosis not present

## 2020-04-28 MED FILL — PALIPERIDONE ER 6 MG TABLET: 6 | 90 days supply | Qty: 90 | Fill #0

## 2020-05-10 MED FILL — FLUTICASONE PROP 50 MCG SPR: 50 | 30 days supply | Qty: 16 | Fill #1

## 2020-05-10 MED FILL — QUEtiapine FUMARATE ER 400: 400 | 30 days supply | Qty: 60 | Fill #2

## 2020-05-11 ENCOUNTER — Encounter (HOSPITAL_COMMUNITY): Payer: Self-pay | Admitting: Emergency Medicine

## 2020-05-11 ENCOUNTER — Other Ambulatory Visit: Payer: Self-pay

## 2020-05-11 ENCOUNTER — Emergency Department (HOSPITAL_COMMUNITY)
Admission: EM | Admit: 2020-05-11 | Discharge: 2020-05-12 | Disposition: A | Discharge status: Other Acute Inpatient Hospital | Payer: 59 | Attending: Emergency Medicine | Admitting: Emergency Medicine

## 2020-05-11 DIAGNOSIS — Z20822 Contact with and (suspected) exposure to covid-19: Secondary | ICD-10-CM | POA: Insufficient documentation

## 2020-05-11 DIAGNOSIS — F062 Psychotic disorder with delusions due to known physiological condition: Secondary | ICD-10-CM | POA: Diagnosis not present

## 2020-05-11 DIAGNOSIS — F3162 Bipolar disorder, current episode mixed, moderate: Secondary | ICD-10-CM

## 2020-05-11 DIAGNOSIS — F312 Bipolar disorder, current episode manic severe with psychotic features: Secondary | ICD-10-CM | POA: Diagnosis not present

## 2020-05-11 HISTORY — DX: Schizoaffective disorder, unspecified: F25.9

## 2020-05-11 LAB — COMPREHENSIVE METABOLIC PANEL
ALT: 17 U/L (ref 0–44)
AST: 17 U/L (ref 15–41)
Albumin: 3.7 g/dL (ref 3.5–5.0)
Alkaline Phosphatase: 71 U/L (ref 38–126)
Anion gap: 13 (ref 5–15)
BUN: 15 mg/dL (ref 6–20)
CO2: 18 mmol/L — ABNORMAL LOW (ref 22–32)
Calcium: 9 mg/dL (ref 8.9–10.3)
Chloride: 105 mmol/L (ref 98–111)
Creatinine, Ser: 0.79 mg/dL (ref 0.44–1.00)
GFR calc Af Amer: >60 mL/min (ref >60)
GFR calc non Af Amer: >60 mL/min (ref >60)
Glucose, Bld: 151 mg/dL — ABNORMAL HIGH (ref 70–99)
Potassium: 3.6 mmol/L (ref 3.5–5.1)
Sodium: 136 mmol/L (ref 135–145)
Total Bilirubin: 0.5 mg/dL (ref 0.3–1.2)
Total Protein: 7 g/dL (ref 6.5–8.1)

## 2020-05-11 LAB — RESPIRATORY PANEL BY RT PCR (FLU A&B, COVID)
Influenza A by PCR: NEGATIVE
Influenza B by PCR: NEGATIVE
SARS Coronavirus 2 by RT PCR: NEGATIVE

## 2020-05-11 LAB — CBC
HCT: 37.3 % (ref 36.0–46.0)
Hemoglobin: 12.3 g/dL (ref 12.0–15.0)
MCH: 29.8 pg (ref 26.0–34.0)
MCHC: 33 g/dL (ref 30.0–36.0)
MCV: 90.3 fL (ref 80.0–100.0)
Platelets: 260 10*3/uL (ref 150–400)
RBC: 4.13 MIL/uL (ref 3.87–5.11)
RDW: 13.9 % (ref 11.5–15.5)
WBC: 8.3 10*3/uL (ref 4.0–10.5)
nRBC: 0 % (ref 0.0–0.2)

## 2020-05-11 LAB — I-STAT BETA HCG BLOOD, ED (MC, WL, AP ONLY): I-stat hCG, quantitative: <5 m[IU]/mL (ref <5)

## 2020-05-11 LAB — RAPID URINE DRUG SCREEN, HOSP PERFORMED
Amphetamines: NOT DETECTED
Barbiturates: NOT DETECTED
Benzodiazepines: NOT DETECTED
Cocaine: NOT DETECTED
Opiates: NOT DETECTED
Tetrahydrocannabinol: NOT DETECTED

## 2020-05-11 LAB — ETHANOL: Alcohol, Ethyl (B): <10 mg/dL (ref <10)

## 2020-05-11 LAB — ACETAMINOPHEN LEVEL: Acetaminophen (Tylenol), Serum: <10 ug/mL — ABNORMAL LOW (ref 10–30)

## 2020-05-11 LAB — SALICYLATE LEVEL: Salicylate Lvl: <7 mg/dL — ABNORMAL LOW (ref 7.0–30.0)

## 2020-05-11 MED ORDER — HYDROXYZINE HCL 25 MG PO TABS: 25.0000 mg | ORAL_TABLET | Freq: Four times a day (QID) | ORAL | Status: DC | PRN

## 2020-05-11 MED ORDER — ACETAMINOPHEN 500 MG PO TABS: 1000.0000 mg | ORAL_TABLET | Freq: Once | ORAL | Status: DC

## 2020-05-11 MED ORDER — PALIPERIDONE ER 6 MG PO TB24
6.0000 mg | ORAL_TABLET | Freq: Every day | ORAL | Status: DC
  Filled 2020-05-11: qty 1

## 2020-05-11 MED ORDER — LORAZEPAM 1 MG PO TABS
1.0000 mg | ORAL_TABLET | Freq: Every day | ORAL | Status: DC
  Administered 2020-05-11: 1 mg via ORAL
  Filled 2020-05-11: qty 1

## 2020-05-11 MED ORDER — QUETIAPINE FUMARATE ER 400 MG PO TB24
800.0000 mg | ORAL_TABLET | Freq: Every day | ORAL | Status: DC
  Administered 2020-05-11: 800 mg via ORAL
  Filled 2020-05-11 (×2): qty 2

## 2020-05-11 NOTE — ED Triage Notes (Signed)
Patient arrived with 2 [employer] officers handcuffed IVC for mental illness /danger to others , patient communicated threats to harm mother and sister , sent obscene messages and picture to family members . Patient denies SI /no hallucinations.

## 2020-05-11 NOTE — ED Provider Notes (Addendum)
[facility] EMERGENCY DEPARTMENT Provider Note   CSN: [id number] Arrival date & time: 05/11/20  0158     History Chief Complaint  Patient presents with  . IVC- Homicidal    [name] is a 24 y.o. female.  Patient presents with [employer] with IVC papers, family indicating patient sending inappropriate/obscene messages to them, and made threats of harm against family member(s). Pt currently denies those issues. States she has had disagreement/arguement with family members, but denies any thoughts about harming self or others. Patient indicates she feels fine and denies specific complaint. States is compliant w her normal meds. States is eating, drinking, sleeping per her normal routine. Denies any current mental or physical c/o. Denies etoh or substance abuse problems.   The history is provided by the patient, medical records and the police.       Past Medical History:  Diagnosis Date  . ADHD (attention deficit hyperactivity disorder)   . Allergy   . Anxiety   . Bipolar disorder (HCC)   . Eczema 12/11/13  . Schizo-affective schizophrenia [facility])     Patient Active Problem List   Diagnosis Date Noted  . Morbid obesity (HCC) 09/06/2019  . Possible pregnancy 09/06/2019  . Delirium tremens (HCC) 05/20/2019  . Seizure (HCC) 05/20/2019  . Acute vaginitis 05/20/2019  . High risk heterosexual behavior 05/20/2019  . Vitamin D deficiency 03/10/2019  . Constipation 04/03/2018  . Schizoaffective disorder (HCC) 03/08/2018  . Bipolar disorder, current episode manic severe with psychotic features (HCC)   . MDD (major depressive disorder), recurrent severe, without psychosis (HCC) 02/11/2018  . Pelvic pain 01/31/2018  . Genital herpes simplex 10/24/2017  . Anxiety 08/30/2017  . Severe bipolar I disorder, current or most recent episode depressed (HCC) 03/30/2017  . Syncope 03/03/2017  . STD exposure 05/13/2015  . Preventative health care 03/16/2013  . Bipolar  disorder (HCC) 12/30/2012  . ADD (attention deficit disorder) 04/24/2011    History reviewed. No pertinent surgical history.   OB History   No obstetric history on file.     Family History  Problem Relation Age of Onset  . Asthma Mother   . Stroke Father   . Heart disease Father   . Asthma Father   . Hypertension Father        pulmonary hypertension  . Heart disease Maternal Uncle   . Hypertension Maternal Grandmother   . COPD Maternal Grandmother   . Diabetes Maternal Grandfather   . Stroke Paternal Grandmother   . Diabetes Paternal Grandmother   . Diabetes Paternal Grandfather   . Leukemia Maternal Uncle     Social History   Tobacco Use  . Smoking status: Never Smoker  . Smokeless tobacco: Never Used  Vaping Use  . Vaping Use: Never used  Substance Use Topics  . Alcohol use: Yes    Comment: occasionally  . Drug use: No    Home Medications Prior to Admission medications   Medication Sig Start Date End Date Taking? Authorizing Provider  amphetamine-dextroamphetamine (ADDERALL) 20 MG tablet Take 20 mg by mouth daily.    [provider]  cetirizine (ZYRTEC) 5 MG tablet Take 1 tablet (5 mg total) by mouth daily. 03/10/19   [name], DO  fluconazole (DIFLUCAN) 150 MG tablet 1 po x1, may repeat in 3 days prn Patient not taking: Reported on 09/03/2019 05/28/19   Seabron Spates R, DO  fluticasone (FLONASE) 50 MCG/ACT nasal spray PLACE 2 SPRAYS INTO EACH NOSTRIL EVERY  DAY 07/01/19   [name], DO  hydrOXYzine (ATARAX/VISTARIL) 25 MG tablet Take 1 tablet (25 mg total) by mouth 3 (three) times daily as needed for anxiety. 07/13/19   [name], DO  hydrOXYzine (VISTARIL) 25 MG capsule Take 1 capsule (25 mg total) by mouth 30 (thirty) minutes before procedure for 1 dose. 07/12/19   Seabron Spates R, DO  LORazepam (ATIVAN) 1 MG tablet Take 1 tablet (1 mg total) by mouth at bedtime. 07/12/19   [name], DO    norgestimate-ethinyl estradiol (SPRINTEC 28) 0.25-35 MG-MCG tablet Take 1 tablet by mouth daily. 03/10/19   Zola Button, Yvonne R, DO  OLANZapine (ZYPREXA) 20 MG tablet Take 1 tablet (20 mg total) by mouth at bedtime. 07/12/19   [name], DO  Vitamin D, Ergocalciferol, (DRISDOL) 1.25 MG (50000 UT) CAPS capsule Take 1 capsule (50,000 Units total) by mouth every 7 (seven) days. 08/21/19   [name], DO    Allergies    Concerta [methylphenidate], Terbinafine, Clindamycin hcl, Haldol [haloperidol], and Terbinafine hcl  Review of Systems   Review of Systems  Constitutional: Negative for fever.  HENT: Negative for sore throat.   Eyes: Negative for redness.  Respiratory: Negative for shortness of breath.   Cardiovascular: Negative for chest pain.  Gastrointestinal: Negative for abdominal pain.  Genitourinary: Negative for flank pain.  Musculoskeletal: Negative for back pain and neck pain.  Skin: Negative for rash.  Neurological: Negative for headaches.  Hematological: Does not bruise/bleed easily.  Psychiatric/Behavioral: Negative for self-injury and suicidal ideas.    Physical Exam Updated Vital Signs BP 122/83 (BP Location: Left Arm)   Pulse (!) 106   Temp 98 F (36.7 C) (Oral)   Resp 16   Ht 1.626 m (5\' 4" )   Wt 95 kg   LMP 05/09/2020   SpO2 100%   BMI 35.95 kg/m   Physical Exam Vitals and nursing note reviewed.  Constitutional:      Appearance: Normal appearance. She is well-developed.  HENT:     Head: Atraumatic.     Nose: Nose normal.     Mouth/Throat:     Mouth: Mucous membranes are moist.  Eyes:     General: No scleral icterus.    Conjunctiva/sclera: Conjunctivae normal.     Pupils: Pupils are equal, round, and reactive to light.  Neck:     Trachea: No tracheal deviation.  Cardiovascular:     Rate and Rhythm: Normal rate and regular rhythm.     Pulses: Normal pulses.     Heart sounds: Normal heart sounds. No murmur heard.  No friction  rub. No gallop.   Pulmonary:     Effort: Pulmonary effort is normal. No respiratory distress.     Breath sounds: Normal breath sounds.  Abdominal:     General: There is no distension.     Palpations: Abdomen is soft.     Tenderness: There is no abdominal tenderness.  Genitourinary:    Comments: No cva tenderness.  Musculoskeletal:        General: No swelling.     Cervical back: Normal range of motion and neck supple. No rigidity. No muscular tenderness.  Skin:    General: Skin is warm and dry.     Findings: No rash.  Neurological:     Mental Status: She is alert.     Comments: Alert, speech normal. Steady gait.   Psychiatric:  Mood and Affect: Mood normal.     Comments: Alert, content, cooperative. Denies any thoughts of harm to self or others. No hallucinations or delusions.      ED Results / Procedures / Treatments   Labs (all labs ordered are listed, but only abnormal results are displayed) Results for orders placed or performed during the hospital encounter of 05/11/20  Comprehensive metabolic panel  Result Value Ref Range   Sodium 136 135 - 145 mmol/L   Potassium 3.6 3.5 - 5.1 mmol/L   Chloride 105 98 - 111 mmol/L   CO2 18 (L) 22 - 32 mmol/L   Glucose, Bld 151 (H) 70 - 99 mg/dL   BUN 15 6 - 20 mg/dL   Creatinine, Ser 1.610.79 0.44 - 1.00 mg/dL   Calcium 9.0 8.9 - 09.610.3 mg/dL   Total Protein 7.0 6.5 - 8.1 g/dL   Albumin 3.7 3.5 - 5.0 g/dL   AST 17 15 - 41 U/L   ALT 17 0 - 44 U/L   Alkaline Phosphatase 71 38 - 126 U/L   Total Bilirubin 0.5 0.3 - 1.2 mg/dL   GFR calc non Af Amer >60 >60 mL/min   GFR calc Af Amer >60 >60 mL/min   Anion gap 13 5 - 15  Ethanol  Result Value Ref Range   Alcohol, Ethyl (B) <10 <10 mg/dL  Salicylate level  Result Value Ref Range   Salicylate Lvl <7.0 (L) 7.0 - 30.0 mg/dL  Acetaminophen level  Result Value Ref Range   Acetaminophen (Tylenol), Serum <10 (L) 10 - 30 ug/mL  cbc  Result Value Ref Range   WBC 8.3 4.0 - 10.5 K/uL    RBC 4.13 3.87 - 5.11 MIL/uL   Hemoglobin 12.3 12.0 - 15.0 g/dL   HCT 04.537.3 36 - 46 %   MCV 90.3 80.0 - 100.0 fL   MCH 29.8 26.0 - 34.0 pg   MCHC 33.0 30.0 - 36.0 g/dL   RDW 40.913.9 81.111.5 - 91.415.5 %   Platelets 260 150 - 400 K/uL   nRBC 0.0 0.0 - 0.2 %  Rapid urine drug screen (hospital performed)  Result Value Ref Range   Opiates NONE DETECTED NONE DETECTED   Cocaine NONE DETECTED NONE DETECTED   Benzodiazepines NONE DETECTED NONE DETECTED   Amphetamines NONE DETECTED NONE DETECTED   Tetrahydrocannabinol NONE DETECTED NONE DETECTED   Barbiturates NONE DETECTED NONE DETECTED  I-Stat beta hCG blood, ED  Result Value Ref Range   I-stat hCG, quantitative <5.0 <5 mIU/mL   Comment 3            EKG None  Radiology No results found.  Procedures Procedures (including critical care time)  Medications Ordered in ED Medications - No data to display  ED Course  I have reviewed the triage vital signs and the nursing notes.  Pertinent labs & imaging results that were available during my care of the patient were reviewed by me and considered in my medical decision making (see chart for details).    MDM Rules/Calculators/A&P                         Labs sent. Patient presented with IVC and family concern for acute mental health issue - screening labs including drug screen are appropriate/indicated in this setting.   Reviewed nursing notes and prior charts for additional history.   Labs reviewed/interpreted by me - chem normal. etoh neg. uds negative.   Recheck pt - remains  calm, cooperative, denies any thoughts of harm to self or others.   BH team consulted - evaluation pending.   Disposition per [facility] team.    Final Clinical Impression(s) / ED Diagnoses Final diagnoses:  None    Rx / DC Orders ED Discharge Orders    None         Cathren Laine, MD 06/15/20 1900
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
## 2020-05-11 NOTE — ED Notes (Signed)
Spoke with Bonner Puna (mother) providing pt update, pt spoke with mother.

## 2020-05-11 NOTE — Progress Notes (Addendum)
Patients mother, Elita Quick [phone], does not want patient transported to [facility].  She stated that the patient has been there before  And "they didn't do anything to make her better." The mother was requesting that the patient be sent to other facilities.  The clinician explained the process of referring patients out to facilities across the state and that we have no control on what facility excepts what patients.  [name] stated that if the patient gets sent to [facility] she will not go pick her up.  [name] also stated that she was going to go to the courthouse in the morning and talk to the judge who issued the IVC paperwork and have it rescended and she would then take the patient to another facility.  At this time the patient is still scheduled to be transported to [facility] in the morning.   Ladoris Gene MSW,LCSWA,LCASA Clinical Social Worker  [place] Disposition, CSW [phone] (cell)
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
## 2020-05-11 NOTE — Progress Notes (Signed)
Pt accepted to  [facility] [address] [place], [place] [address], [facility]  Dr. Mena Goes is the attending provider.    Call report to [phone]  Irven Baltimore, RN @  ED notified.     Pt is IVC.  Pt may be transported by [facility]   Pt scheduled  to arrive at [facility] after 11 am, 05/12/20   Transportation will need to be set up.  Informed the patients nurse of placement set for tomorrow.  Ladoris Gene MSW,LCSWA,LCASA Clinical Social Worker  [place] [facility], CSW [phone] (cell)

## 2020-05-11 NOTE — ED Notes (Signed)
Pt states Invega making her angry does not want to take it. Would prefer ativan and seroquel only.

## 2020-05-12 ENCOUNTER — Inpatient Hospital Stay (HOSPITAL_COMMUNITY)
Admission: AD | Admit: 2020-05-12 | Discharge: 2020-05-21 | DRG: 885 | Disposition: A | Discharge status: Home / Self Care | Payer: 59 | Attending: Psychiatry | Admitting: Psychiatry

## 2020-05-12 ENCOUNTER — Other Ambulatory Visit: Payer: Self-pay

## 2020-05-12 ENCOUNTER — Encounter (HOSPITAL_COMMUNITY): Payer: Self-pay | Admitting: Physician Assistant

## 2020-05-12 DIAGNOSIS — G4700 Insomnia, unspecified: Secondary | ICD-10-CM | POA: Diagnosis present

## 2020-05-12 DIAGNOSIS — Z8249 Family history of ischemic heart disease and other diseases of the circulatory system: Secondary | ICD-10-CM

## 2020-05-12 DIAGNOSIS — F909 Attention-deficit hyperactivity disorder, unspecified type: Secondary | ICD-10-CM | POA: Diagnosis present

## 2020-05-12 DIAGNOSIS — F319 Bipolar disorder, unspecified: Principal | ICD-10-CM | POA: Diagnosis present

## 2020-05-12 DIAGNOSIS — I10 Essential (primary) hypertension: Secondary | ICD-10-CM | POA: Diagnosis present

## 2020-05-12 DIAGNOSIS — F1320 Sedative, hypnotic or anxiolytic dependence, uncomplicated: Secondary | ICD-10-CM

## 2020-05-12 DIAGNOSIS — F419 Anxiety disorder, unspecified: Secondary | ICD-10-CM | POA: Diagnosis present

## 2020-05-12 DIAGNOSIS — F259 Schizoaffective disorder, unspecified: Secondary | ICD-10-CM | POA: Diagnosis present

## 2020-05-12 DIAGNOSIS — Z79899 Other long term (current) drug therapy: Secondary | ICD-10-CM | POA: Diagnosis not present

## 2020-05-12 DIAGNOSIS — Z560 Unemployment, unspecified: Secondary | ICD-10-CM

## 2020-05-12 DIAGNOSIS — R251 Tremor, unspecified: Secondary | ICD-10-CM | POA: Diagnosis present

## 2020-05-12 DIAGNOSIS — Z9151 Personal history of suicidal behavior: Secondary | ICD-10-CM | POA: Diagnosis not present

## 2020-05-12 HISTORY — DX: Sedative, hypnotic or anxiolytic dependence, uncomplicated: F13.20

## 2020-05-12 HISTORY — DX: Bipolar disorder, unspecified: F31.9

## 2020-05-12 LAB — LIPID PANEL
Cholesterol: 190 mg/dL (ref 0–200)
HDL: 67 mg/dL (ref >40)
LDL Cholesterol: 115 mg/dL — ABNORMAL HIGH (ref 0–99)
Total CHOL/HDL Ratio: 2.8 RATIO
Triglycerides: 38 mg/dL (ref <150)
VLDL: 8 mg/dL (ref 0–40)

## 2020-05-12 LAB — HEMOGLOBIN A1C
Hgb A1c MFr Bld: 5.2 % (ref 4.8–5.6)
Mean Plasma Glucose: 102.54 mg/dL

## 2020-05-12 LAB — TSH: TSH: 1.131 u[IU]/mL (ref 0.350–4.500)

## 2020-05-12 MED ORDER — ZIPRASIDONE MESYLATE 20 MG IM SOLR: 20.0000 mg | INTRAMUSCULAR | Status: DC | PRN

## 2020-05-12 MED ORDER — OLANZAPINE 10 MG PO TBDP
10.0000 mg | ORAL_TABLET | Freq: Three times a day (TID) | ORAL | Status: DC | PRN
  Administered 2020-05-13 – 2020-05-15 (×3): 10 mg via ORAL
  Filled 2020-05-12 (×3): qty 1

## 2020-05-12 MED ORDER — LORAZEPAM 1 MG PO TABS: 1.0000 mg | ORAL_TABLET | Freq: Every day | ORAL | Status: DC

## 2020-05-12 MED ORDER — LORAZEPAM 1 MG PO TABS: 1.0000 mg | ORAL_TABLET | ORAL | Status: DC | PRN

## 2020-05-12 MED ORDER — ACETAMINOPHEN 325 MG PO TABS: 650.0000 mg | ORAL_TABLET | Freq: Four times a day (QID) | ORAL | Status: DC | PRN

## 2020-05-12 MED ORDER — MAGNESIUM HYDROXIDE 400 MG/5ML PO SUSP: 30.0000 mL | Freq: Every day | ORAL | Status: DC | PRN

## 2020-05-12 MED ORDER — HYDROXYZINE HCL 25 MG PO TABS
25.0000 mg | ORAL_TABLET | Freq: Four times a day (QID) | ORAL | Status: DC | PRN
  Administered 2020-05-14 – 2020-05-16 (×2): 25 mg via ORAL
  Filled 2020-05-12 (×2): qty 1

## 2020-05-12 MED ORDER — QUETIAPINE FUMARATE ER 400 MG PO TB24
800.0000 mg | ORAL_TABLET | Freq: Every day | ORAL | Status: DC
  Administered 2020-05-12: 800 mg via ORAL
  Filled 2020-05-12 (×2): qty 2

## 2020-05-12 MED ORDER — ALUM & MAG HYDROXIDE-SIMETH 200-200-20 MG/5ML PO SUSP
30.0000 mL | ORAL | Status: DC | PRN
  Administered 2020-05-18: 30 mL via ORAL
  Filled 2020-05-12: qty 30

## 2020-05-12 MED ORDER — LORAZEPAM 1 MG PO TABS
1.0000 mg | ORAL_TABLET | Freq: Four times a day (QID) | ORAL | Status: DC | PRN
  Administered 2020-05-12 – 2020-05-20 (×7): 1 mg via ORAL
  Filled 2020-05-12 (×7): qty 1

## 2020-05-12 MED ORDER — TRAZODONE HCL 50 MG PO TABS: 50.0000 mg | ORAL_TABLET | Freq: Every evening | ORAL | Status: DC | PRN

## 2020-05-12 NOTE — ED Notes (Signed)
Spoke with [facility] due to [facility] not answering phone to take report. Was told to call back at 9 due to the staff being in a progression meeting.
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
## 2020-05-12 NOTE — Progress Notes (Signed)
Recreation Therapy Notes  INPATIENT RECREATION THERAPY ASSESSMENT  Patient Details Name: [name] MRN: [id number] DOB: Mar 08, 1996 Today's Date: 05/12/2020       Information Obtained From: Patient  Able to Participate in Assessment/Interview: Yes  Patient Presentation: Alert  Reason for Admission (Per Patient): Other (Comments) (Argument with mom)  Patient Stressors: Other (Comment) ("worried it will be the same outcome when I leave")  Coping Skills:   Isolation, Journal, Sports, TV, Music, Talk, Prayer, Other (Comment), Avoidance, Dance, Hot Bath/Shower [employer]; Write songs)  Leisure Interests (2+):  Music - Singing, Music - Other (Comment) (Write songs)  Frequency of Recreation/Participation: Other (Comment) (Every other week)  Awareness of Community Resources:  Yes  Community Resources:  Library, [employer], Public affairs consultant, Research scientist (physical sciences)  Current Use: Yes  If no, Barriers?:    Expressed Interest in [employer] Information: No  [employer] of Residence:  Engineer, technical sales  Patient Main Form of Transportation: Set designer  Patient Strengths:  Sisterhood with sister; Listening to music  Patient Identified Areas of Improvement:  Communication with mom  Patient Goal for Hospitalization:  "get out"  Current SI (including self-harm):  No  Current HI:  No  Current AVH: No  Staff Intervention Plan: Group Attendance, Collaborate with Interdisciplinary Treatment Team  Consent to Intern Participation: N/A    Caroll Rancher, LRT/CTRS  Caroll Rancher A 05/12/2020, 1:37 PM

## 2020-05-12 NOTE — ED Notes (Signed)
Pt transported by [facility] to [facility].

## 2020-05-12 NOTE — ED Notes (Signed)
Ben with [facility] requests pt report to be called after 0730

## 2020-05-12 NOTE — Tx Team (Signed)
Initial Treatment Plan 05/12/2020 12:22 PM [name] IWO:[id number]    PATIENT STRESSORS: Financial difficulties Health problems Marital or family conflict   PATIENT STRENGTHS: Average or above average intelligence Supportive family/friends   PATIENT IDENTIFIED PROBLEMS: Depression  Psychosis  Irritable                 DISCHARGE CRITERIA:  Ability to meet basic life and health needs Motivation to continue treatment in a less acute level of care  PRELIMINARY DISCHARGE PLAN: Attend aftercare/continuing care group Outpatient therapy Return to previous living arrangement  PATIENT/FAMILY INVOLVEMENT: This treatment plan has been presented to and reviewed with the patient, [name].  The patient and family have been given the opportunity to ask questions and make suggestions.  Clarene Critchley, RN 05/12/2020, 12:22 PM
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
## 2020-05-12 NOTE — ED Notes (Signed)
Attempted to call report

## 2020-05-12 NOTE — Progress Notes (Signed)
Admission Note: Patient is a 24 year old female admitted to the unit under IVC from [facility] for threatening to kill her mother and bizarre behavior.  Patient is alert and oriented x 4.  Patient presents with blunted affect and irritable mood.  Denies suicidal thoughts, auditory and visual hallucinations.  States she doesn't need to be here.  States she sees Dr. [name] for therapy and medication management.  Admission plan of care reviewed and consent signed.  Skin assessment completed.  Skin is dry and intact.  Patient has no personal belongings on admission.  Patient oriented to the unit, staff and room.  Routine safety checks initiated.  Verbalizes unit rules and protocols.  Patient is safe on the unit.

## 2020-05-12 NOTE — BHH Suicide Risk Assessment (Signed)
[facility] Admission Suicide Risk Assessment   Nursing information obtained from:  Patient Demographic factors:  Adolescent or young adult Current Mental Status:  NA Loss Factors:  Decline in physical health, Financial problems / change in socioeconomic status Historical Factors:  NA Risk Reduction Factors:  Living with another person, especially a relative  Total Time spent with patient: 30 minutes Principal Problem: <principal problem not specified> Diagnosis:  Active Problems:   Bipolar I disorder (HCC)  Subjective Data: Patient is seen and examined. Patient is a 24 year old female with a reported past psychiatric history significant for bipolar disorder who presented to the [facility] emergency department on 05/11/2020 under involuntary commitment. The patient had apparently communicated threats to harm her mother and sister. There was also obscene messages sent and pictures to family members. The patient is followed by Dr. [name] as an outpatient, but the patient stated she had not seen her since approximately June of this year. Patient stated that she had gotten into an argument with her mother the night prior to admission, and then had texted her that she wanted to "kill her". She stated that she did not mean that. The mother also reported that the patient had become increasingly delusional over the last month. The patient called her in the middle the night to say that she could read her thoughts, that she communicates with Beyonc, as well as other celebrities. The patient stated the biggest problem that she has is that she is suffering from "cabin fever". She stated that she writes music, sings and wants to get out to show her talents. 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 She was not a great historian today and minimized her situation. She stated that she had gotten well while at "the other hospital". She stated she is ready for  discharge. She stated that she has been admitted to the hospital multiple times, and that they force her to stay 7 days. She stated she is ready for discharge today. She was admitted to the hospital for evaluation and stabilization. The patient reportedly has been treated with quetiapine extended release 800 mg p.o. nightly. It is unclear at this point about her compliance with medications. Review of the PMP database revealed that her last prescription from Dr. [name] was for lorazepam as well as phentermine. It does not appear as though those prescriptions have been filled most recently. Her drug screen on admission did not show evidence of benzodiazepines as well. On examination today the patient is somewhat euphoric, with pressured speech, and clearly some elevated mood and delusional thinking. It was also reported that the patient has an upcoming court date for assault and battery. Reportedly the mother spoke with the patient's psychiatrist last week and the psychiatrist recommended to the mother to involuntarily commit her.  Continued Clinical Symptoms:  Alcohol Use Disorder Identification Test Final Score (AUDIT): 0 The "Alcohol Use Disorders Identification Test", Guidelines for Use in Primary Care, Second Edition.  World Science writer [facility]). Score between 0-7:  no or low risk or alcohol related problems. Score between 8-15:  moderate risk of alcohol related problems. Score between 16-19:  high risk of alcohol related problems. Score 20 or above:  warrants further diagnostic evaluation for alcohol dependence and treatment.   CLINICAL FACTORS:   Bipolar Disorder:   Mixed State   Musculoskeletal: Strength & Muscle Tone: within normal limits Gait & Station: normal Patient leans: N/A  Psychiatric Specialty Exam: Physical  Exam Vitals and nursing note reviewed.  Constitutional:      Appearance: Normal appearance.  HENT:     Head: Normocephalic and atraumatic.  Pulmonary:     Effort:  Pulmonary effort is normal.  Neurological:     General: No focal deficit present.     Mental Status: She is alert and oriented to person, place, and time.     Review of Systems  All other systems reviewed and are negative.   Blood pressure 129/88, pulse (!) 112, temperature 98 F (36.7 C), temperature source Oral, resp. rate 18, height 5\' 4"  (1.626 m), weight 77.1 kg, last menstrual period 05/09/2020, SpO2 100 %.Body mass index is 29.18 kg/m.  General Appearance: Fairly Groomed  Eye Contact:  Good  Speech:  Pressured  Volume:  Increased  Mood:  Euphoric  Affect:  Labile  Thought Process:  Goal Directed and Descriptions of Associations: Circumstantial  Orientation:  Full (Time, Place, and Person)  Thought Content:  Delusions  Suicidal Thoughts:  No  Homicidal Thoughts:  No  Memory:  Immediate;   Poor Recent;   Poor Remote;   Poor  Judgement:  Impaired  Insight:  Lacking  Psychomotor Activity:  Increased  Concentration:  Concentration: Fair and Attention Span: Fair  Recall:  05/11/2020 of Knowledge:  Fair  Language:  Good  Akathisia:  Negative  Handed:  Right  AIMS (if indicated):     Assets:  Desire for Improvement Housing Resilience  ADL's:  Intact  Cognition:  WNL  Sleep:         COGNITIVE FEATURES THAT CONTRIBUTE TO RISK:  None    SUICIDE RISK:   Moderate:  Frequent suicidal ideation with limited intensity, and duration, some specificity in terms of plans, no associated intent, good self-control, limited dysphoria/symptomatology, some risk factors present, and identifiable protective factors, including available and accessible social support.  PLAN OF CARE: Patient is seen and examined. Patient is a 24 year old female with the above-stated past psychiatric history who was involuntarily committed secondary to worsening bipolar mania symptoms included delusional thinking as well as homicidal ideation. She will be admitted to the hospital. She will be integrated in  the milieu. She will be encouraged to attend groups. We will continue the quetiapine extended release 800 mg p.o. nightly. I will also go on and write for lorazepam 1 mg p.o. every 6 hours as needed a CIWA greater than 10. Her drug screen was negative, but she has had prescriptions for benzodiazepines in the past and I want to make sure that she does not go into withdrawal. We will also put an agitation protocol on board in case, and as well hydroxyzine for anxiety and trazodone for sleep. We will contact the mother for collateral information. The patient denied any current medical problems. I have asked pharmacy to check with the patient's pharmacy for all of her most accurate medications. Her last psychiatric hospitalization at our facility was in July 2019. Her discharge medications at that time included fluvoxamine, haloperidol, haloperidol decanoate and metoprolol. Review of her admission laboratories revealed a blood sugar of 151. Creatinine was normal at 0.79 and liver function enzymes are normal. CBC was normal. Her acetaminophen was less than 10, salicylate less than 7. Beta-hCG was negative. Blood alcohol was less than 10. Drug screen was negative. EKG was not done, that will be ordered. TSH has already been ordered. She is tachycardic. Most recently her rate was 112. Blood pressure was 129/88. She is afebrile.  I  certify that inpatient services furnished can reasonably be expected to improve the patient's condition.   Antonieta Pert, MD 05/12/2020, 1:41 PM

## 2020-05-12 NOTE — ED Notes (Signed)
BH called regarding pt bed placement, per [facility] pt is accepted and is able to be transported to facility after 0800.

## 2020-05-12 NOTE — Progress Notes (Signed)
Pt accepted to [facility], bed 506-2   Nira Conn, NP is the accepting provider.    Dr. Jola Babinski is the attending provider.    Call report to [phone]    Emilee @ [facility] ED notified.     Pt is scheduled to arrive at [facility] as soon as transportation can be arranged.    Wells Guiles, MSW, LCSW, LCAS Clinical Social Worker II Disposition CSW [phone]

## 2020-05-13 MED ORDER — OLANZAPINE 10 MG IM SOLR
10.0000 mg | Freq: Every day | INTRAMUSCULAR | Status: DC
  Filled 2020-05-13 (×3): qty 10

## 2020-05-13 MED ORDER — LORAZEPAM 1 MG PO TABS
1.0000 mg | ORAL_TABLET | Freq: Every day | ORAL | Status: DC
  Administered 2020-05-13 – 2020-05-20 (×8): 1 mg via ORAL
  Filled 2020-05-13 (×8): qty 1

## 2020-05-13 MED ORDER — PALIPERIDONE ER 3 MG PO TB24
3.0000 mg | ORAL_TABLET | ORAL | Status: AC
  Administered 2020-05-13: 3 mg via ORAL
  Filled 2020-05-13 (×2): qty 1

## 2020-05-13 MED ORDER — PALIPERIDONE ER 6 MG PO TB24
6.0000 mg | ORAL_TABLET | Freq: Every day | ORAL | Status: DC
  Administered 2020-05-13: 6 mg via ORAL
  Filled 2020-05-13 (×3): qty 1

## 2020-05-13 MED ORDER — OLANZAPINE 10 MG IM SOLR
5.0000 mg | Freq: Every day | INTRAMUSCULAR | Status: DC
  Administered 2020-05-14: 5 mg via INTRAMUSCULAR
  Filled 2020-05-13 (×4): qty 10

## 2020-05-13 NOTE — Tx Team (Signed)
Interdisciplinary Treatment and Diagnostic Plan Update  05/13/2020 Time of Session: 9:45am [name] MRN: [id number]  Principal Diagnosis: Bipolar I disorder (HCC)  Secondary Diagnoses: Principal Problem:   Bipolar I disorder (HCC) Active Problems:   Benzodiazepine dependence (HCC)   Current Medications:  Current Facility-Administered Medications  Medication Dose Route Frequency Provider Last Rate Last Admin   acetaminophen (TYLENOL) tablet 650 mg  650 mg Oral Q6H PRN Nwoko, Uchenna E, PA       alum & mag hydroxide-simeth (MAALOX/MYLANTA) 200-200-20 MG/5ML suspension 30 mL  30 mL Oral Q4H PRN Nwoko, Uchenna E, PA       hydrOXYzine (ATARAX/VISTARIL) tablet 25 mg  25 mg Oral Q6H PRN Nwoko, Uchenna E, PA       LORazepam (ATIVAN) tablet 1 mg  1 mg Oral Q6H PRN Antonieta Pert, MD   1 mg at 05/12/20 2106   OLANZapine zydis (ZYPREXA) disintegrating tablet 10 mg  10 mg Oral Q8H PRN Antonieta Pert, MD       And   LORazepam (ATIVAN) tablet 1 mg  1 mg Oral PRN Antonieta Pert, MD       And   ziprasidone (GEODON) injection 20 mg  20 mg Intramuscular PRN Antonieta Pert, MD       magnesium hydroxide (MILK OF MAGNESIA) suspension 30 mL  30 mL Oral Daily PRN Nwoko, Uchenna E, PA       QUEtiapine (SEROQUEL XR) 24 hr tablet 800 mg  800 mg Oral QHS Nwoko, Uchenna E, PA   800 mg at 05/12/20 2106   PTA Medications: Medications Prior to Admission  Medication Sig Dispense Refill Last Dose   cetirizine (ZYRTEC) 5 MG tablet Take 1 tablet (5 mg total) by mouth daily. 90 tablet 1    D3-50 1.25 MG (50000 UT) capsule Take 50,000 Units by mouth once a week. Sundays      fluticasone (FLONASE) 50 MCG/ACT nasal spray PLACE 2 SPRAYS INTO EACH NOSTRIL EVERY DAY (Patient taking differently: Place 2 sprays into both nostrils as needed for allergies. ) 16 g 2    hydrOXYzine (ATARAX/VISTARIL) 25 MG tablet Take 1 tablet (25 mg total) by mouth 3 (three) times daily as needed for anxiety. (Patient not  taking: Reported on 05/11/2020) 30 tablet 0    hydrOXYzine (VISTARIL) 25 MG capsule Take 1 capsule (25 mg total) by mouth 30 (thirty) minutes before procedure for 1 dose. (Patient not taking: Reported on 05/11/2020) 30 capsule 0    LORazepam (ATIVAN) 1 MG tablet Take 1 tablet (1 mg total) by mouth at bedtime. (Patient taking differently: Take 2 mg by mouth at bedtime. ) 30 tablet 0    norgestimate-ethinyl estradiol (SPRINTEC 28) 0.25-35 MG-MCG tablet Take 1 tablet by mouth daily. (Patient not taking: Reported on 05/11/2020) 84 tablet 3    OLANZapine (ZYPREXA) 20 MG tablet Take 1 tablet (20 mg total) by mouth at bedtime. (Patient not taking: Reported on 05/11/2020) 30 tablet 0    paliperidone (INVEGA) 6 MG 24 hr tablet Take 6 mg by mouth at bedtime.      QUEtiapine (SEROQUEL XR) 400 MG 24 hr tablet Take 800 mg by mouth at bedtime.       Patient Stressors: Financial difficulties Health problems Marital or family conflict  Patient Strengths: Average or above average intelligence Supportive family/friends  Treatment Modalities: Medication Management, Group therapy, Case management,  1 to 1 session with clinician, Psychoeducation, Recreational therapy.   Physician Treatment Plan for Primary Diagnosis:  Bipolar I disorder (HCC) Long Term Goal(s): Improvement in symptoms so as ready for discharge Improvement in symptoms so as ready for discharge   Short Term Goals: Ability to identify changes in lifestyle to reduce recurrence of condition will improve Ability to verbalize feelings will improve Ability to demonstrate self-control will improve Ability to identify and develop effective coping behaviors will improve Ability to maintain clinical measurements within normal limits will improve Compliance with prescribed medications will improve Ability to identify triggers associated with substance abuse/mental health issues will improve Ability to identify changes in lifestyle to reduce recurrence of  condition will improve Ability to verbalize feelings will improve Ability to demonstrate self-control will improve Ability to identify and develop effective coping behaviors will improve Ability to maintain clinical measurements within normal limits will improve Compliance with prescribed medications will improve Ability to identify triggers associated with substance abuse/mental health issues will improve  Medication Management: Evaluate patient's response, side effects, and tolerance of medication regimen.  Therapeutic Interventions: 1 to 1 sessions, Unit Group sessions and Medication administration.  Evaluation of Outcomes: Progressing  Physician Treatment Plan for Secondary Diagnosis: Principal Problem:   Bipolar I disorder (HCC) Active Problems:   Benzodiazepine dependence (HCC)  Long Term Goal(s): Improvement in symptoms so as ready for discharge Improvement in symptoms so as ready for discharge   Short Term Goals: Ability to identify changes in lifestyle to reduce recurrence of condition will improve Ability to verbalize feelings will improve Ability to demonstrate self-control will improve Ability to identify and develop effective coping behaviors will improve Ability to maintain clinical measurements within normal limits will improve Compliance with prescribed medications will improve Ability to identify triggers associated with substance abuse/mental health issues will improve Ability to identify changes in lifestyle to reduce recurrence of condition will improve Ability to verbalize feelings will improve Ability to demonstrate self-control will improve Ability to identify and develop effective coping behaviors will improve Ability to maintain clinical measurements within normal limits will improve Compliance with prescribed medications will improve Ability to identify triggers associated with substance abuse/mental health issues will improve     Medication Management:  Evaluate patient's response, side effects, and tolerance of medication regimen.  Therapeutic Interventions: 1 to 1 sessions, Unit Group sessions and Medication administration.  Evaluation of Outcomes: Progressing   RN Treatment Plan for Primary Diagnosis: Bipolar I disorder (HCC) Long Term Goal(s): Knowledge of disease and therapeutic regimen to maintain health will improve  Short Term Goals: Ability to remain free from injury will improve, Ability to demonstrate self-control, Ability to participate in decision making will improve, Ability to verbalize feelings will improve, Ability to identify and develop effective coping behaviors will improve and Compliance with prescribed medications will improve  Medication Management: RN will administer medications as ordered by provider, will assess and evaluate patient's response and provide education to patient for prescribed medication. RN will report any adverse and/or side effects to prescribing provider.  Therapeutic Interventions: 1 on 1 counseling sessions, Psychoeducation, Medication administration, Evaluate responses to treatment, Monitor vital signs and CBGs as ordered, Perform/monitor CIWA, COWS, AIMS and Fall Risk screenings as ordered, Perform wound care treatments as ordered.  Evaluation of Outcomes: Progressing   LCSW Treatment Plan for Primary Diagnosis: Bipolar I disorder (HCC) Long Term Goal(s): Safe transition to appropriate next level of care at discharge, Engage patient in therapeutic group addressing interpersonal concerns.  Short Term Goals: Engage patient in aftercare planning with referrals and resources, Increase social support, Increase emotional regulation, Facilitate acceptance  of mental health diagnosis and concerns, Identify triggers associated with mental health/substance abuse issues and Increase skills for wellness and recovery  Therapeutic Interventions: Assess for all discharge needs, 1 to 1 time with Social worker,  Explore available resources and support systems, Assess for adequacy in community support network, Educate family and significant other(s) on suicide prevention, Complete Psychosocial Assessment, Interpersonal group therapy.  Evaluation of Outcomes: Progressing   Progress in Treatment: Attending groups: No. Participating in groups: No. Taking medication as prescribed: Yes. Toleration medication: Yes. Family/Significant other contact made: No, will contact:  If consents are given Patient understands diagnosis: No. Discussing patient identified problems/goals with staff: Yes. Medical problems stabilized or resolved: Yes. Denies suicidal/homicidal ideation: Yes. Issues/concerns per patient self-inventory: No.   New problem(s) identified: No, Describe:  None  New Short Term/Long Term Goal(s): medication stabilization, elimination of SI thoughts, development of comprehensive mental wellness plan.   Patient Goals: "To live with my sister when I get out and to get out of the hospital"  Discharge Plan or Barriers: Patient recently admitted. CSW will continue to follow and assess for appropriate referrals and possible discharge planning.   Reason for Continuation of Hospitalization: Hallucinations Medication stabilization  Estimated Length of Stay: 3 to 5 days  Attendees: Patient: [name] 05/13/2020   Physician: Gretta Cool, MD 05/13/2020   Nursing:  05/13/2020   RN Care Manager: 05/13/2020   Social Worker: Ruthann Cancer 05/13/2020   Recreational Therapist:  05/13/2020   Other:  05/13/2020   Other:  05/13/2020   Other: 05/13/2020      Scribe for Treatment Team: Aram Beecham, LCSWA 05/13/2020 9:44 AM

## 2020-05-13 NOTE — Progress Notes (Signed)
[facility] MD Progress Note  05/13/2020 8:21 AM [name]  MRN:  [id number] Subjective:  "I think the medication is working as I am not talking to myself in my head".  Pt is seen and examined today. Pt states her mood is good. She rates her mood at 7/10 (10 is the best mood). Pt states she was having a lot of self talk and racing thoughts at night. Pt denies any racing thoughts right now. She states "I think the medication is working as I am not talking to myself in my head". She states she was going to bathroom a lot and couldn't sleep well.  Pt states her appetite is good and ate half of her breaksfast. Pt states she will be switching environment when she will be discharged and going to her sister's home and doesn't like that her mother committed her here. Pt rates her anxiety at 6/10 (10 is the worst anxiety). Currently, Pt denies any suicidal ideation, homicidal ideation and, visual and auditory hallucination. Pt complains of increased frequency of urination but denies any burning or dysuria. Pt complains of mild tremors. Pt denies any headache, nausea, vomiting, dizziness, chest pain, SOB, abdominal pain, diarrhea,and constipation. Pt denies any medication side effects and has been tolerating it well. Pt denies any concerns.  Objective: Pt is 24 year old female with past history of Bipolar disorder presented to [facility] on 05/11/2020 with GPD, IVCed by her Mom. Family indicated patient sent inappropriate/obscene messages to them, and made threats of harm against family member(s).  On Examination, Pt is delusional, paranoid, circumstantial, labile and oriented x 3. Pt's speech is pressured, hyper verbal. Pt's mood is Euphoric and affect is labile. Pt's judgement is impaired and she lacks insight. No SI, HI and AVH. Mild tremors present. Blood pressure (!) 154/106, pulse (!) 132, temperature 98 F (36.7 C), temperature source Oral, resp. rate 18, height 5\' 4"  (1.626 m), weight 77.1 kg, last menstrual period  05/09/2020, SpO2 99 %. Nursing notes-  indicate that Pt didn't sleep at night. Pt back and forth to the bathroom flushing the toilet numerous times. Pt laying in both beds even after she was informed she may get a roommate and she was supposed to lay in only  One bed. Pt clearly paranoid and acting bizarre at times. Pt was pacing in the hallway today.  With CSW  Pt stated goal as "To live with my sister when I get out and to get out of the hospital"  Principal Problem: Bipolar I disorder (HCC) Diagnosis: Principal Problem:   Bipolar I disorder (HCC) Active Problems:   Benzodiazepine dependence (HCC)  Total Time spent with patient: 20 minutes  Past Psychiatric History: see H&P  Past Medical History:  Past Medical History:  Diagnosis Date   ADHD (attention deficit hyperactivity disorder)    Allergy    Anxiety    Bipolar disorder (HCC)    Eczema 12/11/13   Schizo-affective schizophrenia (HCC)    History reviewed. No pertinent surgical history. Family History:  Family History  Problem Relation Age of Onset   Asthma Mother    Stroke Father    Heart disease Father    Asthma Father    Hypertension Father        pulmonary hypertension   Heart disease Maternal Uncle    Hypertension Maternal Grandmother    COPD Maternal Grandmother    Diabetes Maternal Grandfather    Stroke Paternal Grandmother    Diabetes Paternal Grandmother    Diabetes  Paternal Grandfather    Leukemia Maternal Uncle    Family Psychiatric  History:See H&P Social History:  Social History   Substance and Sexual Activity  Alcohol Use Yes   Comment: occasionally     Social History   Substance and Sexual Activity  Drug Use No    Social History   Socioeconomic History   Marital status: Single    Spouse name: Not on file   Number of children: Not on file   Years of education: Not on file   Highest education level: Not on file  Occupational History   Occupation: Unemployed   Tobacco Use   Smoking status: Never Smoker   Smokeless tobacco: Never Used  Building services engineer Use: Never used  Substance and Sexual Activity   Alcohol use: Yes    Comment: occasionally   Drug use: No   Sexual activity: Not Currently    Birth control/protection: Pill  Other Topics Concern   Not on file  Social History Narrative   Lives with mom in an apartment on the second floor.  No children.  Currently not working.  Education: college.    Social Determinants of Health   Financial Resource Strain:    Difficulty of Paying Living Expenses: Not on file  Food Insecurity:    Worried About Programme researcher, broadcasting/film/video in the Last Year: Not on file   [employer] of Food in the Last Year: Not on file  Transportation Needs:    Lack of Transportation (Medical): Not on file   Lack of Transportation (Non-Medical): Not on file  Physical Activity:    Days of Exercise per Week: Not on file   Minutes of Exercise per Session: Not on file  Stress:    Feeling of Stress : Not on file  Social Connections:    Frequency of Communication with Friends and Family: Not on file   Frequency of Social Gatherings with Friends and Family: Not on file   Attends Religious Services: Not on file   Active Member of Clubs or Organizations: Not on file   Attends Banker Meetings: Not on file   Marital Status: Not on file   Additional Social History:                         Sleep: Poor  Appetite:  Fair  Current Medications: Current Facility-Administered Medications  Medication Dose Route Frequency Provider Last Rate Last Admin   acetaminophen (TYLENOL) tablet 650 mg  650 mg Oral Q6H PRN Nwoko, Uchenna E, PA       alum & mag hydroxide-simeth (MAALOX/MYLANTA) 200-200-20 MG/5ML suspension 30 mL  30 mL Oral Q4H PRN Nwoko, Uchenna E, PA       hydrOXYzine (ATARAX/VISTARIL) tablet 25 mg  25 mg Oral Q6H PRN Nwoko, Uchenna E, PA       LORazepam (ATIVAN) tablet 1 mg  1 mg  Oral Q6H PRN Antonieta Pert, MD   1 mg at 05/12/20 2106   OLANZapine zydis (ZYPREXA) disintegrating tablet 10 mg  10 mg Oral Q8H PRN Antonieta Pert, MD       And   LORazepam (ATIVAN) tablet 1 mg  1 mg Oral PRN Antonieta Pert, MD       And   ziprasidone (GEODON) injection 20 mg  20 mg Intramuscular PRN Antonieta Pert, MD       magnesium hydroxide (MILK OF MAGNESIA) suspension 30 mL  30 mL  Oral Daily PRN Nwoko, Uchenna E, PA       QUEtiapine (SEROQUEL XR) 24 hr tablet 800 mg  800 mg Oral QHS Nwoko, Uchenna E, PA   800 mg at 05/12/20 2106    Lab Results:  Results for orders placed or performed during the hospital encounter of 05/12/20 (from the past 48 hour(s))  Hemoglobin A1c     Status: None   Collection Time: 05/12/20  6:09 PM  Result Value Ref Range   Hgb A1c MFr Bld 5.2 4.8 - 5.6 %    Comment: (NOTE) Pre diabetes:          5.7%-6.4%  Diabetes:              >6.4%  Glycemic control for   <7.0% adults with diabetes    Mean Plasma Glucose 102.54 mg/dL    Comment: Performed at [facility], [address] [place], [place] [address]  Lipid panel     Status: Abnormal   Collection Time: 05/12/20  6:09 PM  Result Value Ref Range   Cholesterol 190 0 - 200 mg/dL   Triglycerides 38 <191<150 mg/dL   HDL 67 >47>40 mg/dL   Total CHOL/HDL Ratio 2.8 RATIO   VLDL 8 0 - 40 mg/dL   LDL Cholesterol 829115 (H) 0 - 99 mg/dL    Comment:        Total Cholesterol/HDL:CHD Risk Coronary Heart Disease Risk Table                     Men   Women  1/2 Average Risk   3.4   3.3  Average Risk       5.0   4.4  2 X Average Risk   9.6   7.1  3 X Average Risk  23.4   11.0        Use the calculated Patient Ratio above and the CHD Risk Table to determine the patient's CHD Risk.        ATP III CLASSIFICATION (LDL):  <100     mg/dL   Optimal  [id number]  mg/dL   Near or Above                    Optimal  130-159  mg/dL   Borderline  [id number]  mg/dL   High  >696>190     mg/dL   Very  High Performed at [facility], [name] [place], [place] [address]   TSH     Status: None   Collection Time: 05/12/20  6:09 PM  Result Value Ref Range   TSH 1.131 0.350 - 4.500 uIU/mL    Comment: Performed by a 3rd Generation assay with a functional sensitivity of <=0.01 uIU/mL. Performed at [facility], [address] [place], [place] [address]     Blood Alcohol level:  Lab Results  Component Value Date   ETH <10 05/11/2020   ETH <10 02/11/2018    Metabolic Disorder Labs: Lab Results  Component Value Date   HGBA1C 5.2 05/12/2020   MPG 102.54 05/12/2020   MPG 93.93 03/11/2018   No results found for: PROLACTIN Lab Results  Component Value Date   CHOL 190 05/12/2020   TRIG 38 05/12/2020   HDL 67 05/12/2020   CHOLHDL 2.8 05/12/2020   VLDL 8 05/12/2020   LDLCALC 115 (H) 05/12/2020   LDLCALC 102 (H) 08/20/2019    Physical Findings: AIMS:  , ,  ,  ,  CIWA:    COWS:     Musculoskeletal: Strength & Muscle Tone: within normal limits Gait & Station: normal Patient leans: N/A  Psychiatric Specialty Exam: Physical Exam Vitals and nursing note reviewed.  Constitutional:      General: She is not in acute distress.    Appearance: Normal appearance. She is not ill-appearing, toxic-appearing or diaphoretic.  HENT:     Head: Normocephalic and atraumatic.  Pulmonary:     Effort: Pulmonary effort is normal.  Neurological:     General: No focal deficit present.     Mental Status: She is alert and oriented to person, place, and time.     Review of Systems  Constitutional: Negative.   Respiratory: Negative.   Cardiovascular: Negative.   Gastrointestinal: Negative.   Genitourinary: Positive for frequency. Negative for difficulty urinating, dyspareunia, dysuria and flank pain.  Musculoskeletal: Negative.   Neurological: Positive for tremors. Negative for dizziness, seizures, weakness, light-headedness, numbness and  headaches.  Psychiatric/Behavioral: Negative for agitation, dysphoric mood, hallucinations, sleep disturbance and suicidal ideas. The patient is nervous/anxious.     Blood pressure (!) 154/106, pulse (!) 132, temperature 98 F (36.7 C), temperature source Oral, resp. rate 18, height 5\' 4"  (1.626 m), weight 77.1 kg, last menstrual period 05/09/2020, SpO2 99 %.Body mass index is 29.18 kg/m.  General Appearance: Casual  Eye Contact:  Good  Speech:  Pressured  Volume:  Increased  Mood:  Euphoric  Affect:  Labile  Thought Process:  Goal Directed and Descriptions of Associations: Circumstantial  Orientation:  Full (Time, Place, and Person)  Thought Content:  Delusions, Paranoid Ideation and Rumination  Suicidal Thoughts:  No  Homicidal Thoughts:  No  Memory:  Immediate;   Poor Recent;   Poor Remote;   Poor  Judgement:  Impaired  Insight:  Lacking  Psychomotor Activity:  Increased  Concentration:  Concentration: Fair and Attention Span: Fair  Recall:  05/11/2020 of Knowledge:  Fair  Language:  Good  Akathisia:  Negative  Handed:  Left  AIMS (if indicated):     Assets:  Desire for Improvement Housing Resilience  ADL's:  Intact  Cognition:  WNL  Sleep:        Treatment Plan Summary:t is 24 year old female with past history of Bipolar disorder presented to [facility] on 05/11/2020 with GPD, IVCed by her Mom. Family indicated patient sent inappropriate/obscene messages to them, and made threats of harm against family member(s).  On Examination, Pt is delusional, paranoid, circumstantial, labile and oriented x 3. Pt's speech is pressured, hyper verbal. Pt's mood is Euphoric and affect is labile. Pt's judgement is impaired and she lacks insight. No SI, HI and AVH. Mild tremors present. Blood pressure (!) 154/106, pulse (!) 132, temperature 98 F (36.7 C), temperature source Oral, resp. rate 18, height 5\' 4"  (1.626 m), weight 77.1 kg, last menstrual period 05/09/2020, SpO2 99 %. We will stop  Seroquel and start paliperdone orally. Pt will get receive Invega sustenna later on. Plan-  Daily contact with patient to assess and evaluate symptoms and progress in treatment - Monitor Vitals. - Monitor for withdrawal symptoms. - Monitor for medication side effects. - Agitation Protocol as needed with Zyprexa/Ativan and Geodon -Continue Milk of Magnesia 30 ml PRN Daily for Constipation. -Continue Maalox/Mylanta 30 ml Q4H PRN for Indigestion. - Send Urinalysis. - Stop Seroquel. -Continue Ativan 1 mg QHS -Start Paliperdone 3 mg now (1st dose) and 6 mg nightly. -Continue Ativan 1 mg Q6H PRN for anxiety and  and agitation. -Start Hydroxyzine 25 mg TID PRN for Anxiety. -Start Trazodone 50 mg QHS PRN for sleep. -Patient will participate in the therapeutic group milieu.  Karsten Ro, MD 05/13/2020, 8:21 AM
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
## 2020-05-13 NOTE — Progress Notes (Signed)
Pt visible on the unit, pt reluctantly took HS medication. Pt has Zyprexa IM, but pt stated she was willing to take it PO, so pt was given PRN per MAR .     05/13/20 2100  Psych Admission Type (Psych Patients Only)  Admission Status Involuntary  Psychosocial Assessment  Patient Complaints Anxiety  Eye Contact Fair  Facial Expression Anxious;Pensive;Sullen;Sad;Worried  Affect Anxious;Preoccupied;Sad;Sullen  Furniture conservator/restorer;Restless  Appearance/Hygiene Unremarkable  Behavior Characteristics Cooperative  Mood Anxious;Preoccupied  Thought Process  Coherency WDL  Content Paranoia  Delusions Paranoid  Perception WDL  Hallucination None reported or observed  Judgment Poor  Confusion None  Danger to Self  Current suicidal ideation? Denies  Danger to Others  Danger to Others None reported or observed

## 2020-05-13 NOTE — Progress Notes (Signed)
Progress note  Pt originally refusing invega po. Pt provided education. Pt requesting only seroquel and ativan. MD ordered IM zyprexa if they refuse oral invega. Pt provided more education and was eventually compliant with medication administration of invega. Will continue to monitor.

## 2020-05-13 NOTE — Progress Notes (Signed)
Pt has been up majority of the night. Pt back and forth to the bathroom flushing the toilet numerous times. Pt laying in both beds even after she was informed she may get a roommate and she was supposed to lay in only  One bed. Pt clearly paranoid and acting bizarre at times. Pt Trazodone had to be D/C due to pt stating it gives her nightmares, pt was focused on her Seroquel and Ativan . Pt stated she could not sleep without that medication , but pt has not slept any this evening. Pt was informed that she got the medication she requested for sleep and was not sleeping , so the doctor may give her different medications to help her sleep.

## 2020-05-13 NOTE — Plan of Care (Signed)
Progress note  D: pt found in the hallway this morning pacing. Pt denied any physical complaints or pain. Pt is stating as not sleeping last night. Pt laid down for roughly 15 minutes but still hasn't slept. Pt seems paranoid with morning assessment, forwarding little. Pt was assertive with being willing to interact though. Pt continues to be reclusive to their room. Pt failed to eat breakfast this morning. Pt denies si/hi/ah/vh and verbally agrees to approach staff if these become apparent or before harming themself/others while at bhh.  A: Pt provided support and encouragement. Pt given medication per protocol and standing orders. Q42m safety checks implemented and continued.  R: Pt safe on the unit. Will continue to monitor.  Pt progressing in the following metrics  Problem: Education: Goal: Knowledge of [place] [facility] Education information/materials will improve Outcome: Progressing Goal: Emotional status will improve Outcome: Progressing Goal: Mental status will improve Outcome: Progressing Goal: Verbalization of understanding the information provided will improve Outcome: Progressing

## 2020-05-14 DIAGNOSIS — F319 Bipolar disorder, unspecified: Principal | ICD-10-CM

## 2020-05-14 LAB — URINALYSIS, ROUTINE W REFLEX MICROSCOPIC
Bilirubin Urine: NEGATIVE
Glucose, UA: NEGATIVE mg/dL
Hgb urine dipstick: NEGATIVE
Ketones, ur: NEGATIVE mg/dL
Leukocytes,Ua: NEGATIVE
Nitrite: NEGATIVE
Protein, ur: NEGATIVE mg/dL
Specific Gravity, Urine: 1.015 (ref 1.005–1.030)
pH: 6 (ref 5.0–8.0)

## 2020-05-14 MED ORDER — PALIPERIDONE ER 6 MG PO TB24
9.0000 mg | ORAL_TABLET | Freq: Every day | ORAL | Status: DC
  Administered 2020-05-14 – 2020-05-15 (×2): 9 mg via ORAL
  Filled 2020-05-14 (×4): qty 1

## 2020-05-14 MED ORDER — PALIPERIDONE PALMITATE ER 234 MG/1.5ML IM SUSY
234.0000 mg | PREFILLED_SYRINGE | Freq: Once | INTRAMUSCULAR | Status: AC
  Administered 2020-05-15: 234 mg via INTRAMUSCULAR
  Filled 2020-05-14: qty 1.5

## 2020-05-14 NOTE — Progress Notes (Signed)
[facility] MD Progress Note  05/14/2020 2:36 PM [name]  MRN:  [id number] Subjective:  "I am going to my sister's house after discharge". Pt is seen and examined today. Pt minimizes her symptoms. Pt states her mood is low. She rates her mood at 6/10 (10 is the best mood). Pt slept well last night. Pt states her appetite is getting back and she ate her meals. Pt rates her anxiety at 2 /10 (10 is the worst anxiety). She states "I am going to my sister's house after discharge, my mom commits me here". She states she feels that she is unwanted and her mom doesn't want her. She states "My mom doesn't want me to be independent". She says "I have to change my environment". She endorses auditory hallucinations states she hears voices before going to sleep that her sister is getting raped and her mom is using crack. She states "I don't know why she can hear these voices and why these thoughts are coming to her mind but it really upset her. She has been taking all her medications. Currently, Pt denies any suicidal ideation, homicidal ideation and visual hallucinations. Pt denies any headache, nausea, vomiting, dizziness, chest pain, SOB, abdominal pain, diarrhea, and constipation. Pt denies any medication side effects and has been tolerating it well. Pt denies any other concerns.  Objective : Pt is 24 year old female with past history of Bipolar disorder presented to [facility] on 05/11/2020 with GPD, IVCed by her Mom. Family indicatedpatient sentinappropriate/obscene messages to them, and made threats of harm against family member(s). On Examination, Pt is tearful, delusional, paranoid, circumstantial, labile and oriented x 3. Pt's speech is normal rate and her volume is decreased. Pt's mood is depressed, dysphoric and affect is labile and tearful. Pt's judgement is impaired and she lacks insight. Endorses Auditory hallucinations. Denies visual hallucinations. No SI, HI. Mild tremors present. Blood pressure 112/81,  pulse (!) 112, temperature 98.1 F (36.7 C), temperature source Oral, resp. rate 20, height 5\' 4"  (1.626 m), weight 77.1 kg, last menstrual period 05/09/2020, SpO2 99 %. Nursing notes indicate that Pt slept for 6.5 hours. At night, Pt stated her roommate woke her up and she could not relax to go back to sleep. Pt given PRN Vistaril per MAR to help relax Pt is on paliperdone orally. We will increase its dose to 9 mg.  Pt will get receive Invega sustenna tomorrow.  Principal Problem: Bipolar I disorder (HCC) Diagnosis: Principal Problem:   Bipolar I disorder (HCC) Active Problems:   Benzodiazepine dependence (HCC)  Total Time spent with patient: 35 minutes  Past Psychiatric History: see H&P  Past Medical History:  Past Medical History:  Diagnosis Date  . ADHD (attention deficit hyperactivity disorder)   . Allergy   . Anxiety   . Bipolar disorder (HCC)   . Eczema 12/11/13  . Schizo-affective schizophrenia (HCC)    History reviewed. No pertinent surgical history. Family History:  Family History  Problem Relation Age of Onset  . Asthma Mother   . Stroke Father   . Heart disease Father   . Asthma Father   . Hypertension Father        pulmonary hypertension  . Heart disease Maternal Uncle   . Hypertension Maternal Grandmother   . COPD Maternal Grandmother   . Diabetes Maternal Grandfather   . Stroke Paternal Grandmother   . Diabetes Paternal Grandmother   . Diabetes Paternal Grandfather   . Leukemia Maternal Uncle    Family Psychiatric  History: see H&P Social History:  Social History   Substance and Sexual Activity  Alcohol Use Yes   Comment: occasionally     Social History   Substance and Sexual Activity  Drug Use No    Social History   Socioeconomic History  . Marital status: Single    Spouse name: Not on file  . Number of children: Not on file  . Years of education: Not on file  . Highest education level: Not on file  Occupational History  . Occupation:  Unemployed  Tobacco Use  . Smoking status: Never Smoker  . Smokeless tobacco: Never Used  Vaping Use  . Vaping Use: Never used  Substance and Sexual Activity  . Alcohol use: Yes    Comment: occasionally  . Drug use: No  . Sexual activity: Not Currently    Birth control/protection: Pill  Other Topics Concern  . Not on file  Social History Narrative   Lives with mom in an apartment on the second floor.  No children.  Currently not working.  Education: college.    Social Determinants of Health   Financial Resource Strain:   . Difficulty of Paying Living Expenses: Not on file  Food Insecurity:   . Worried About Programme researcher, broadcasting/film/video in the Last Year: Not on file  . Ran Out of Food in the Last Year: Not on file  Transportation Needs:   . Lack of Transportation (Medical): Not on file  . Lack of Transportation (Non-Medical): Not on file  Physical Activity:   . Days of Exercise per Week: Not on file  . Minutes of Exercise per Session: Not on file  Stress:   . Feeling of Stress : Not on file  Social Connections:   . Frequency of Communication with Friends and Family: Not on file  . Frequency of Social Gatherings with Friends and Family: Not on file  . Attends Religious Services: Not on file  . Active Member of Clubs or Organizations: Not on file  . Attends Banker Meetings: Not on file  . Marital Status: Not on file   Additional Social History:                         Sleep: Good  Appetite:  Fair  Current Medications: Current Facility-Administered Medications  Medication Dose Route Frequency Provider Last Rate Last Admin  . acetaminophen (TYLENOL) tablet 650 mg  650 mg Oral Q6H PRN Nwoko, Uchenna E, PA      . alum & mag hydroxide-simeth (MAALOX/MYLANTA) 200-200-20 MG/5ML suspension 30 mL  30 mL Oral Q4H PRN Nwoko, Uchenna E, PA      . hydrOXYzine (ATARAX/VISTARIL) tablet 25 mg  25 mg Oral Q6H PRN Nwoko, Uchenna E, PA   25 mg at 05/14/20 0202  .  LORazepam (ATIVAN) tablet 1 mg  1 mg Oral Q6H PRN Antonieta Pert, MD   1 mg at 05/14/20 1251  . OLANZapine zydis (ZYPREXA) disintegrating tablet 10 mg  10 mg Oral Q8H PRN Antonieta Pert, MD   10 mg at 05/14/20 1251   And  . LORazepam (ATIVAN) tablet 1 mg  1 mg Oral PRN Antonieta Pert, MD       And  . ziprasidone (GEODON) injection 20 mg  20 mg Intramuscular PRN Antonieta Pert, MD      . LORazepam (ATIVAN) tablet 1 mg  1 mg Oral QHS Antonieta Pert, MD   1 mg  at 05/13/20 2113  . magnesium hydroxide (MILK OF MAGNESIA) suspension 30 mL  30 mL Oral Daily PRN Nwoko, Uchenna E, PA      . OLANZapine (ZYPREXA) injection 10 mg  10 mg Intramuscular QHS Antonieta Pert, MD      . OLANZapine [facility]) injection 5 mg  5 mg Intramuscular Daily Antonieta Pert, MD   5 mg at 05/14/20 0819  . paliperidone (INVEGA) 24 hr tablet 6 mg  6 mg Oral QHS Antonieta Pert, MD   6 mg at 05/13/20 2101    Lab Results:  Results for orders placed or performed during the hospital encounter of 05/12/20 (from the past 48 hour(s))  Hemoglobin A1c     Status: None   Collection Time: 05/12/20  6:09 PM  Result Value Ref Range   Hgb A1c MFr Bld 5.2 4.8 - 5.6 %    Comment: (NOTE) Pre diabetes:          5.7%-6.4%  Diabetes:              >6.4%  Glycemic control for   <7.0% adults with diabetes    Mean Plasma Glucose 102.54 mg/dL    Comment: Performed at [facility], [address], [place], [place] [address]  Lipid panel     Status: Abnormal   Collection Time: 05/12/20  6:09 PM  Result Value Ref Range   Cholesterol 190 0 - 200 mg/dL   Triglycerides 38 <409 mg/dL   HDL 67 >81 mg/dL   Total CHOL/HDL Ratio 2.8 RATIO   VLDL 8 0 - 40 mg/dL   LDL Cholesterol 191 (H) 0 - 99 mg/dL    Comment:        Total Cholesterol/HDL:CHD Risk Coronary Heart Disease Risk Table                     Men   Women  1/2 Average Risk   3.4   3.3  Average Risk       5.0   4.4  2 X Average Risk   9.6   7.1  3 X  Average Risk  23.4   11.0        Use the calculated Patient Ratio above and the CHD Risk Table to determine the patient's CHD Risk.        ATP III CLASSIFICATION (LDL):  <100     mg/dL   Optimal  478-295  mg/dL   Near or Above                    Optimal  130-159  mg/dL   Borderline  621-308  mg/dL   High  >657     mg/dL   Very High Performed at [facility], [address] [place], [place] [address]   TSH     Status: None   Collection Time: 05/12/20  6:09 PM  Result Value Ref Range   TSH 1.131 0.350 - 4.500 uIU/mL    Comment: Performed by a 3rd Generation assay with a functional sensitivity of <=0.01 uIU/mL. Performed at [facility], [address] [place], [place] [address]   Urinalysis, Routine w reflex microscopic Urine, Random     Status: None   Collection Time: 05/14/20  6:30 AM  Result Value Ref Range   Color, Urine YELLOW YELLOW   APPearance CLEAR CLEAR   Specific Gravity, Urine 1.015 1.005 - 1.030   pH 6.0 5.0 -  8.0   Glucose, UA NEGATIVE NEGATIVE mg/dL   Hgb urine dipstick NEGATIVE NEGATIVE   Bilirubin Urine NEGATIVE NEGATIVE   Ketones, ur NEGATIVE NEGATIVE mg/dL   Protein, ur NEGATIVE NEGATIVE mg/dL   Nitrite NEGATIVE NEGATIVE   Leukocytes,Ua NEGATIVE NEGATIVE    Comment: Performed at [facility], [address] [place], [place] [id number]    Blood Alcohol level:  Lab Results  Component Value Date   ETH <10 05/11/2020   ETH <10 02/11/2018    Metabolic Disorder Labs: Lab Results  Component Value Date   HGBA1C 5.2 05/12/2020   MPG 102.54 05/12/2020   MPG 93.93 03/11/2018   No results found for: PROLACTIN Lab Results  Component Value Date   CHOL 190 05/12/2020   TRIG 38 05/12/2020   HDL 67 05/12/2020   CHOLHDL 2.8 05/12/2020   VLDL 8 05/12/2020   LDLCALC 115 (H) 05/12/2020   LDLCALC 102 (H) 08/20/2019    Physical Findings: AIMS: Facial and Oral Movements Muscles of Facial Expression:  None, normal Lips and Perioral Area: None, normal Jaw: None, normal Tongue: None, normal,Extremity Movements Upper (arms, wrists, hands, fingers): None, normal Lower (legs, knees, ankles, toes): None, normal, Trunk Movements Neck, shoulders, hips: None, normal, Overall Severity Severity of abnormal movements (highest score from questions above): None, normal Incapacitation due to abnormal movements: None, normal Patient's awareness of abnormal movements (rate only patient's report): No Awareness, Dental Status Current problems with teeth and/or dentures?: No Does patient usually wear dentures?: No  CIWA:    COWS:     Musculoskeletal: Strength & Muscle Tone: within normal limits Gait & Station: normal Patient leans: N/A  Psychiatric Specialty Exam: Physical Exam Vitals and nursing note reviewed.  Constitutional:      General: She is not in acute distress.    Appearance: Normal appearance. She is not ill-appearing, toxic-appearing or diaphoretic.  HENT:     Head: Normocephalic and atraumatic.  Pulmonary:     Effort: Pulmonary effort is normal.  Neurological:     General: No focal deficit present.     Mental Status: She is alert and oriented to person, place, and time.     Review of Systems  Constitutional: Negative for activity change, appetite change, chills, fatigue and fever.  Respiratory: Negative.   Cardiovascular: Negative.   Genitourinary: Negative.   Musculoskeletal: Negative.   Neurological: Negative.   Psychiatric/Behavioral: Positive for dysphoric mood and hallucinations. Negative for sleep disturbance and suicidal ideas. The patient is nervous/anxious.     Blood pressure 112/81, pulse (!) 112, temperature 98.1 F (36.7 C), temperature source Oral, resp. rate 20, height 5\' 4"  (1.626 m), weight 77.1 kg, last menstrual period 05/09/2020, SpO2 99 %.Body mass index is 29.18 kg/m.  General Appearance: Disheveled  Eye Contact:  Good  Speech:  Normal Rate  Volume:   Decreased  Mood:  Anxious, Depressed and Dysphoric  Affect:  Labile and Tearful  Thought Process:  Goal Directed and Descriptions of Associations: Circumstantial  Orientation:  Full (Time, Place, and Person)  Thought Content:  Delusions, Hallucinations: Auditory, Paranoid Ideation and Rumination  Suicidal Thoughts:  No  Homicidal Thoughts:  No  Memory:  Immediate;   Poor Recent;   Poor Remote;   Poor  Judgement:  Impaired  Insight:  Lacking  Psychomotor Activity:  Increased  Concentration:  Concentration: Fair and Attention Span: Fair  Recall:  [employer]  Fund of Knowledge:  Fair  Language:  Good  Akathisia:  Negative  Handed:  Left  AIMS (if indicated):     Assets:  Desire for Improvement Housing Resilience  ADL's:  Intact  Cognition:  WNL  Sleep:  Number of Hours: 6.5     Treatment Plan Summary:Pt is 24 year old female with past history of Bipolar disorder presented to [facility] on 05/11/2020 with GPD, IVCed by her Mom. Family indicatedpatient sentinappropriate/obscene messages to them, and made threats of harm against family member(s). On Examination, Pt is tearful, delusional, paranoid, circumstantial, labile and oriented x 3. Pt's speech is normal rate and her volume is decreased. Pt's mood is depressed, dysphoric and affect is labile and tearful. Pt's judgement is impaired and she lacks insight. Endorses Auditory hallucinations. Denies visual hallucinations. No SI, HI. Mild tremors present. Blood pressure 112/81, pulse (!) 112, temperature 98.1 F (36.7 C), temperature source Oral, resp. rate 20, height 5\' 4"  (1.626 m), weight 77.1 kg, last menstrual period 05/09/2020, SpO2 99 %. Pt is on paliperdone orally. Pt will get receive Invega sustenna tomorrow. Plan-  Daily contact with patient to assess and evaluate symptoms and progress in treatment - Monitor Vitals. -Monitor for withdrawal symptoms. - Monitor for medication side effects. - Agitation Protocol as needed with  Zyprexa/Ativan and Geodon -Continue Milk of Magnesia 30 ml PRN Daily for Constipation. -Continue Maalox/Mylanta 30 ml Q4H PRN for Indigestion. -Stop Zyprexa IM - Continue Ativan 1 mg QHS - Increase Paliperdone to 9 mg nightly. - Pt will get Invega Sustenna 234 mg tomorrow. - Continue Ativan 1 mg Q6HPRNfor anxiety andand agitation. -Start Hydroxyzine 25 mg TID PRN for Anxiety. -Start Trazodone 50 mg QHS PRN for sleep. -Patient will participate in the therapeutic group milieu.   05/11/2020, MD 05/14/2020, 2:36 PM

## 2020-05-14 NOTE — Progress Notes (Signed)
Patient denies SI/HI. She denies AVH. She expressed that she's ready to discharge home and lives with her mother. She requested something for anxiety tonight. She was administered scheduled Ativan 10 minutes prior to her requesting something for anxiety. I discussed with the patient that she had received scheduled Ativan 10 minutes ago, and to give the medications time to be effective. The patient verbalized understanding. She's been appropriate this evening without any behavioral problems.   Orders reviewed. Verbal support provided. 15 minute checks performed for safety.  Patient compliant with taking medications and denies any medication side effects. During medication administration, she was hesitant to taking the scheduled medications. She began writing down the name of the medications and dosage. She then agreed to take the scheduled medications. Pt compliant with attending group tonight.

## 2020-05-14 NOTE — B[facility] Group Notes (Signed)
LCSW Group Therapy Note  05/14/2020   10:00-11:00am   Type of Therapy and Topic:  Group Therapy: Anger Cues and Responses  Participation Level:  Minimal   Description of Group:   In this group, patients learned how to recognize the physical, cognitive, emotional, and behavioral responses they have to anger-provoking situations.  They identified a recent time they became angry and how they reacted.  They analyzed how their reaction was possibly beneficial and how it was possibly unhelpful.  The group discussed a variety of healthier coping skills that could help with such a situation in the future.  Focus was placed on how helpful it is to recognize the underlying emotions to our anger, because working on those can lead to a more permanent solution as well as our ability to focus on the important rather than the urgent.  Therapeutic Goals: Patients will remember their last incident of anger and how they felt emotionally and physically, what their thoughts were at the time, and how they behaved. Patients will identify how their behavior at that time worked for them, as well as how it worked against them. Patients will explore possible new behaviors to use in future anger situations. Patients will learn that anger itself is normal and cannot be eliminated, and that healthier reactions can assist with resolving conflict rather than worsening situations.  Summary of Patient Progress:  The patient shared that her most recent time of anger was "not being able to fulfill my dreams of going to school for law" and said her "aura changed but it was brief."  She then said what she really wants to do is sing, and she hummed throughout the times of group she was present.  She was in and out of the room and not engaged.  Therapeutic Modalities:   Cognitive Behavioral Therapy  Lynnell Chad

## 2020-05-14 NOTE — Progress Notes (Signed)
Pt stated her roommate woke her up and she could not relax to go back to sleep. Pt given PRN Vistaril per MAR to help relax

## 2020-05-14 NOTE — Progress Notes (Signed)
Pt observed dayroom in scheduled groups and other unit activities this shift. Mood remains labile with intervals of crying spells, agitation and anxiety. Denies SI, HI and VH. Endorsed +AH of "the voices are telling me that my mom is using crack and my sister is getting raped everyday I'm in here. I also feel unwanted by my mom. It may not be true but it is my intuition" at the medications window this morning and was crying at the time. Pt became agitated this afternoon, unprovoked, demanding d/c "I want to leave now and I mean now. Y'all can't keep me here no more". Called her sister with threats "If you don't come get me now; I will kill myself". Verbal redirections was ineffective as pt continued to escalate in her demands for d/c. Emotional support and encouragement offered. All medications given as ordered. PRN Ativan 1 mg given PO at 1251.  Safety checks maintained without incident.  Pt appeared less agitated and calmer when reassessed at 1350 and was apologetic for her behavior at the nursing station. She remains safe on and off unit. Tolerates all PO intake well. Denies concerns at this time.

## 2020-05-15 NOTE — B[facility] Group Notes (Signed)
Adult Psychoeducational Group Not Date:  05/15/2020 Time:  1300-1345 Group Topic/Focus: PROGRESSIVE RELAXATION. A group where deep breathing is taught and tensing and relaxation muscle groups is used. Imagery is used as well.  Pts are asked to imagine 3 pillars that hold them up when they are not able to hold themselves up.  Participation Level:  Did not attend   Nakul Avino A 05/15/2020   
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
## 2020-05-15 NOTE — [facility] Group Notes (Signed)
Adult Psychoeducational Group Note  Date:  05/15/2020 Time:  5:25 AM  Group Topic/Focus:  Wrap-Up Group:   The focus of this group is to help patients review their daily goal of treatment and discuss progress on daily workbooks.  Participation Level:  Active  Participation Quality:  Appropriate and Attentive  Affect:  Appropriate  Cognitive:  Alert and Appropriate  Insight: Appropriate and Good  Engagement in Group:  Engaged  Modes of Intervention:  Discussion and Education  Additional Comments:  Pt attended and participated in wrap up group this evening and rated their day a 6/10, due to them having a chill day. Pt goal is to find out their discharge date and states that their is a plan in place.   Chrisandra Netters 05/15/2020, 5:25 AM

## 2020-05-15 NOTE — [facility] Group Notes (Signed)
[facility] LCSW Group Therapy Note  Date/Time:  05/15/2020  11:00AM-12:00PM  Type of Therapy and Topic:  Group Therapy:  Music and Mood  Participation Level:  Did Not Attend   Description of Group: In this process group, members listened to a variety of genres of music and identified that different types of music evoke different responses.  Patients were encouraged to identify music that was soothing for them and music that was energizing for them.  Patients discussed how this knowledge can help with wellness and recovery in various ways including managing depression and anxiety as well as encouraging healthy sleep habits.    Therapeutic Goals: Patients will explore the impact of different varieties of music on mood Patients will verbalize the thoughts they have when listening to different types of music Patients will identify music that is soothing to them as well as music that is energizing to them Patients will discuss how to use this knowledge to assist in maintaining wellness and recovery Patients will explore the use of music as a coping skill  Summary of Patient Progress:  N/A  Therapeutic Modalities: Solution Focused Brief Therapy Activity   Lawrie Tunks Grossman-Orr, LCSW    

## 2020-05-15 NOTE — Progress Notes (Signed)
D: Patient presents with pleasant affect. Patient has mainly been in their room reading or writing in journal.  Patient denies SI/HI at this time. Patient also denies AH/VH at this time. Patient aware to notify staff or RN if this changes.  A: Provided positive reinforcement and encouragement.  R: Patient cooperative and receptive to efforts.  [place] NOVEL CORONAVIRUS (COVID-19) DAILY CHECK-OFF SYMPTOMS - answer yes or no to each - every day NO YES  Have you had a fever in the past 24 hours?   Fever (Temp > 37.80C / 100F) X    Have you had any of these symptoms in the past 24 hours?  New Cough   Sore Throat    Shortness of Breath   Difficulty Breathing   Unexplained Body Aches   X    Have you had any one of these symptoms in the past 24 hours not related to allergies?    Runny Nose   Nasal Congestion   Sneezing   X    If you have had runny nose, nasal congestion, sneezing in the past 24 hours, has it worsened?   X    EXPOSURES - check yes or no X    Have you traveled outside the state in the past 14 days?   X    Have you been in contact with someone with a confirmed diagnosis of COVID-19 or PUI in the past 14 days without wearing appropriate PPE?   X    Have you been living in the same home as a person with confirmed diagnosis of COVID-19 or a PUI (household contact)?     X    Have you been diagnosed with COVID-19?     X                                                                                                                             What to do next: Answered NO to all: Answered YES to anything:    Proceed with unit schedule Follow the [facility] Inpatient Flowsheet.

## 2020-05-16 MED ORDER — PALIPERIDONE ER 6 MG PO TB24
12.0000 mg | ORAL_TABLET | Freq: Every day | ORAL | Status: DC
  Administered 2020-05-16: 12 mg via ORAL
  Filled 2020-05-16 (×2): qty 2

## 2020-05-16 NOTE — Progress Notes (Signed)
Pt appears preoccupied at times, pt is disorganized also, pt tries to appear that nothing is wrong with her, but her thought process is not always logical.    05/16/20 2200  Psych Admission Type (Psych Patients Only)  Admission Status Involuntary  Psychosocial Assessment  Patient Complaints Anxiety  Eye Contact Fair  Facial Expression Anxious  Affect Appropriate to circumstance  Speech Logical/coherent  Interaction Assertive  Motor Activity Fidgety  Appearance/Hygiene Unremarkable  Behavior Characteristics Cooperative  Mood Suspicious;Anxious  Aggressive Behavior  Effect No apparent injury  Thought Process  Coherency WDL  Content Preoccupation  Delusions None reported or observed  Perception Derealization  Hallucination None reported or observed  Judgment Poor  Confusion Mild  Danger to Self  Current suicidal ideation? Denies  Danger to Others  Danger to Others None reported or observed
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
## 2020-05-16 NOTE — Progress Notes (Signed)
[facility] MD Progress Note  05/16/2020 10:30 AM [name]  MRN:  [id number] Subjective:  Pt is seen and examined today. Pt states her mood is ok. She rates her mood at 5/10 (10 is the best mood). Pt states she didn't sleep well last night and feels sleepy and tired today. Pt states her appetite is good and she has been eating her meals. Pt doesn't report any anxiety today. She states yesterday night she was hearing voice and she pee'd on herself. She states today she can't hear any voices so feeling better than last night. Currently, Pt denies any suicidal ideation, homicidal ideation and, visual and auditory hallucination. Pt denies any headache, nausea, vomiting, dizziness, chest pain, SOB, burning urination, abdominal pain, diarrhea, and constipation. Pt denies any medication side effects and has been tolerating it well. Pt denies any concerns.  Objective : Pt is 24 year old female with past history of Bipolar disorder presented to [facility] on 05/11/2020 with GPD, IVCed by her Mom. Family indicated patient sent inappropriate/obscene messages to them, and made threats of harm against family member(s). On Examination, Pt is focused on discharge. She looks tired, sleepy but  less anxious and paranoid than before. She is oriented x 3. Her eye contact is minimal and she closed her eyes most of time during evaluation. Pt's speech is normal rate with normal volume. Pt's mood is dysphoric and affect is congruent.  Pt's judgement is impaired and she lacks insight. No SI, HI and AVH. Blood pressure 123/89, pulse (!) 125, temperature 98.3 F (36.8 C), temperature source Oral, resp. rate 20, height 5\' 4"  (1.626 m), weight 77.1 kg, last menstrual period 05/09/2020, SpO2 99 %. Nursing notes indicate that Pt slept for 1.5 hours. Nursing notes indicate that yesterday night, she reported feeling increasingly anxious and having racing thoughts. She was observed pacing the hallway back and forth. At one point, she walked up to  nurse and whispered "they control my mind." She also reported to another nurse on the hall that she heard another patient taking on the phone to a family member about raping someone. The patient appeared somewhat paranoid and anxious last evening. She was administered Zyprexa 10 mg PO.  Pt apparently urinated on herself yesterday night. She reported that someone came in her room and urinated on her bed. Her Urinalysis doesn't show any signs of infections, Negative for Leucocytes.    Principal Problem: Bipolar I disorder (HCC) Diagnosis: Principal Problem:   Bipolar I disorder (HCC) Active Problems:   Benzodiazepine dependence (HCC)  Total Time spent with patient: 20 minutes  Past Psychiatric History: see H&P  Past Medical History:  Past Medical History:  Diagnosis Date  . ADHD (attention deficit hyperactivity disorder)   . Allergy   . Anxiety   . Bipolar disorder (HCC)   . Eczema 12/11/13  . Schizo-affective schizophrenia (HCC)    History reviewed. No pertinent surgical history. Family History:  Family History  Problem Relation Age of Onset  . Asthma Mother   . Stroke Father   . Heart disease Father   . Asthma Father   . Hypertension Father        pulmonary hypertension  . Heart disease Maternal Uncle   . Hypertension Maternal Grandmother   . COPD Maternal Grandmother   . Diabetes Maternal Grandfather   . Stroke Paternal Grandmother   . Diabetes Paternal Grandmother   . Diabetes Paternal Grandfather   . Leukemia Maternal Uncle    Family Psychiatric  History:  see H&P Social History:  Social History   Substance and Sexual Activity  Alcohol Use Yes   Comment: occasionally     Social History   Substance and Sexual Activity  Drug Use No    Social History   Socioeconomic History  . Marital status: Single    Spouse name: Not on file  . Number of children: Not on file  . Years of education: Not on file  . Highest education level: Not on file  Occupational History   . Occupation: Unemployed  Tobacco Use  . Smoking status: Never Smoker  . Smokeless tobacco: Never Used  Vaping Use  . Vaping Use: Never used  Substance and Sexual Activity  . Alcohol use: Yes    Comment: occasionally  . Drug use: No  . Sexual activity: Not Currently    Birth control/protection: Pill  Other Topics Concern  . Not on file  Social History Narrative   Lives with mom in an apartment on the second floor.  No children.  Currently not working.  Education: college.    Social Determinants of Health   Financial Resource Strain:   . Difficulty of Paying Living Expenses: Not on file  Food Insecurity:   . Worried About Programme researcher, broadcasting/film/video in the Last Year: Not on file  . Ran Out of Food in the Last Year: Not on file  Transportation Needs:   . Lack of Transportation (Medical): Not on file  . Lack of Transportation (Non-Medical): Not on file  Physical Activity:   . Days of Exercise per Week: Not on file  . Minutes of Exercise per Session: Not on file  Stress:   . Feeling of Stress : Not on file  Social Connections:   . Frequency of Communication with Friends and Family: Not on file  . Frequency of Social Gatherings with Friends and Family: Not on file  . Attends Religious Services: Not on file  . Active Member of Clubs or Organizations: Not on file  . Attends Banker Meetings: Not on file  . Marital Status: Not on file   Additional Social History:                         Sleep: Poor  Appetite:  Good  Current Medications: Current Facility-Administered Medications  Medication Dose Route Frequency Provider Last Rate Last Admin  . acetaminophen (TYLENOL) tablet 650 mg  650 mg Oral Q6H PRN Nwoko, Uchenna E, PA      . alum & mag hydroxide-simeth (MAALOX/MYLANTA) 200-200-20 MG/5ML suspension 30 mL  30 mL Oral Q4H PRN Nwoko, Uchenna E, PA      . hydrOXYzine (ATARAX/VISTARIL) tablet 25 mg  25 mg Oral Q6H PRN Nwoko, Uchenna E, PA   25 mg at 05/14/20  0202  . LORazepam (ATIVAN) tablet 1 mg  1 mg Oral Q6H PRN Antonieta Pert, MD   1 mg at 05/16/20 0350  . OLANZapine zydis (ZYPREXA) disintegrating tablet 10 mg  10 mg Oral Q8H PRN Antonieta Pert, MD   10 mg at 05/15/20 2152   And  . LORazepam (ATIVAN) tablet 1 mg  1 mg Oral PRN Antonieta Pert, MD       And  . ziprasidone (GEODON) injection 20 mg  20 mg Intramuscular PRN Antonieta Pert, MD      . LORazepam (ATIVAN) tablet 1 mg  1 mg Oral QHS Antonieta Pert, MD   1 mg at  05/15/20 2104  . magnesium hydroxide (MILK OF MAGNESIA) suspension 30 mL  30 mL Oral Daily PRN Nwoko, Uchenna E, PA      . paliperidone (INVEGA) 24 hr tablet 9 mg  9 mg Oral QHS Karsten Rooda, Zoeie Ritter, MD   9 mg at 05/15/20 2104    Lab Results: No results found for this or any previous visit (from the past 48 hour(s)).  Blood Alcohol level:  Lab Results  Component Value Date   ETH <10 05/11/2020   ETH <10 02/11/2018    Metabolic Disorder Labs: Lab Results  Component Value Date   HGBA1C 5.2 05/12/2020   MPG 102.54 05/12/2020   MPG 93.93 03/11/2018   No results found for: PROLACTIN Lab Results  Component Value Date   CHOL 190 05/12/2020   TRIG 38 05/12/2020   HDL 67 05/12/2020   CHOLHDL 2.8 05/12/2020   VLDL 8 05/12/2020   LDLCALC 115 (H) 05/12/2020   LDLCALC 102 (H) 08/20/2019    Physical Findings: AIMS: Facial and Oral Movements Muscles of Facial Expression: None, normal Lips and Perioral Area: None, normal Jaw: None, normal Tongue: None, normal,Extremity Movements Upper (arms, wrists, hands, fingers): None, normal Lower (legs, knees, ankles, toes): None, normal, Trunk Movements Neck, shoulders, hips: None, normal, Overall Severity Severity of abnormal movements (highest score from questions above): None, normal Incapacitation due to abnormal movements: None, normal Patient's awareness of abnormal movements (rate only patient's report): No Awareness, Dental Status Current problems with  teeth and/or dentures?: No Does patient usually wear dentures?: No  CIWA:    COWS:     Musculoskeletal: Strength & Muscle Tone: within normal limits Gait & Station: normal Patient leans: N/A  Psychiatric Specialty Exam: Physical Exam Vitals and nursing note reviewed.  Constitutional:      General: She is not in acute distress.    Appearance: Normal appearance. She is not ill-appearing, toxic-appearing or diaphoretic.  HENT:     Head: Normocephalic and atraumatic.  Pulmonary:     Effort: Pulmonary effort is normal.  Neurological:     General: No focal deficit present.     Mental Status: She is alert and oriented to person, place, and time.     Review of Systems  Constitutional: Positive for fatigue. Negative for activity change, appetite change, diaphoresis and fever.  HENT: Negative.   Eyes: Negative for visual disturbance.  Respiratory: Negative.   Cardiovascular: Negative.   Gastrointestinal: Negative.   Genitourinary: Negative.   Musculoskeletal: Negative.   Neurological: Negative.   Psychiatric/Behavioral: Positive for dysphoric mood and sleep disturbance. Negative for agitation, hallucinations and suicidal ideas. The patient is nervous/anxious.     Blood pressure 123/89, pulse (!) 125, temperature 98.3 F (36.8 C), temperature source Oral, resp. rate 20, height 5\' 4"  (1.626 m), weight 77.1 kg, last menstrual period 05/09/2020, SpO2 99 %.Body mass index is 29.18 kg/m.  General Appearance: Casual  Eye Contact:  Minimal  Speech:  Normal Rate  Volume:  Decreased  Mood:  Dysphoric  Affect:  Congruent  Thought Process:  Coherent and Goal Directed  Orientation:  Full (Time, Place, and Person)  Thought Content:  Hallucinations: None  Suicidal Thoughts:  No  Homicidal Thoughts:  No  Memory:  Immediate;   Fair Recent;   Fair Remote;   Fair  Judgement:  Fair  Insight:  Fair  Psychomotor Activity:  Normal  Concentration:  Concentration: Fair and Attention Span: Fair   Recall:  [employer]Fair  Fund of Knowledge:  Fair  Language:  Good  Akathisia:  No  Handed:  Right  AIMS (if indicated):     Assets:  Communication Skills Desire for Improvement Housing Social Support  ADL's:  Intact  Cognition:  WNL  Sleep:  Number of Hours: 1.5     Treatment Plan Summary:Pt is 24 year old female with past history of Bipolar disorder presented to [facility] on 05/11/2020 with GPD, IVCed by her Mom. Family indicated patient sent inappropriate/obscene messages to them, and made threats of harm against family member(s). On Examination, Pt is focused on discharge. She looks tired, sleepy but  less anxious and paranoid than before. She is oriented x 3. Her eye contact is minimal and she closed her eyes most of time during evaluation. Pt's speech is normal rate with normal volume. Pt's mood is dysphoric and affect is congruent.  Pt's judgement is impaired and she lacks insight. No SI, HI and AVH. Blood pressure 123/89, pulse (!) 125, temperature 98.3 F (36.8 C), temperature source Oral, resp. rate 20, height 5\' 4"  (1.626 m), weight 77.1 kg, last menstrual period 05/09/2020, SpO2 99 %. Her Urinalysis doesn't show any signs of infections, Negative for Leucocytes.  Plan-  Daily contact with patient to assess and evaluate symptoms and progress in treatment Continue inpatient hospitalization.  -Patient received 05/11/2020 yesterday. -Increase Invega to 12 mg PO QHS for psychosis/mood instability -Continue Ativan 1 mg PO QHS for anxiety/insomnia -Continue Vistaril 25 mg PO Q6HR PRN anxiety -Continue Agitation Protocol. -Patient will participate in the therapeutic group milieu.  Discharge disposition in progress.   Gean Birchwood, MD 05/16/2020, 10:30 AM

## 2020-05-16 NOTE — Progress Notes (Signed)
Recreation Therapy Notes  Date: 10.4.21 Time: 1000 Location: 500 Hall Dayroom  Group Topic: Coping Skills  Goal Area(s) Addresses:  Patient will identify positive coping skills. Patient will identify benefit of using coping skills post d/c.  Intervention: Worksheet  Activity: Healthy vs. Unhealthy Coping Strategies.  Patients were to identify a problem they are currently facing.  Patients were to then identify what healthy and unhealthy coping strategies used and the outcomes.  Education: Pharmacologist, Building control surveyor.   Education Outcome: Acknowledges understanding/In group clarification offered/Needs additional education.   Clinical Observations/Feedback: Pt did not attend group session.     Caroll Rancher, LRT/CTRS        Caroll Rancher A 05/16/2020 12:03 PM

## 2020-05-16 NOTE — [facility] Group Notes (Signed)
LCSW Group Therapy Notes    Type of Therapy and Topic:  Group Therapy:  Healthy and Unhealthy Supports  Date and Time: 05/16/20 and 1:00pm  Participation Level: [facility] PARTICIPATION LEVEL: Active    Description of Group:  Patients in this group were introduced to the idea of adding a variety of healthy supports to address the various needs in their lives. Patients discussed what additional healthy supports could be helpful in their recovery and wellness after discharge in order to prevent future hospitalizations.   An emphasis was placed on using counselor, doctor, therapy groups, 12-step groups, and problem-specific support groups to expand supports.  They also worked as a group on developing a specific plan for several patients to deal with unhealthy supports through boundary-setting, psychoeducation with loved ones, and even termination of relationships.   Therapeutic Goals:               1)  discuss importance of adding supports to stay well once out of the hospital             2)  compare healthy versus unhealthy supports and identify some examples of each             3)  generate ideas and descriptions of healthy supports that can be added             4)  offer mutual support about how to address unhealthy supports             5)  encourage active participation in and adherence to discharge plan               Summary of Patient Progress:  This patient attended and participated in group. This patient was appropriate and supportive during group. This patient was able to identify that her family has been a healthy support system for her.    Therapeutic Modalities:   Motivational Interviewing Brief Solution-Focused Therapy   Ruthann Cancer MSW, LCSW Clincal Social Worker  [facility]

## 2020-05-16 NOTE — Progress Notes (Signed)
DAR NOTE: Patient presents with anxious affect and paranoid behavior mood.  Accusing staff of talking about her and poisoning her and having fun with it.  Denies suicidal thought, pain, auditory and visual hallucinations.  Rates depression at 0, hopelessness at 0, and anxiety at 0.  Maintained on routine safety checks.  Medications given as prescribed.  Support and encouragement offered as needed.  Attended group and participated.  States goal for today is "getting enough sleep."  Patient observed socializing with peers in the dayroom.  Patient is safe on the unit.

## 2020-05-17 MED ORDER — OLANZAPINE 5 MG PO TBDP
15.0000 mg | ORAL_TABLET | Freq: Every day | ORAL | Status: DC
  Administered 2020-05-17: 15 mg via ORAL
  Filled 2020-05-17 (×2): qty 3

## 2020-05-17 MED ORDER — PALIPERIDONE ER 3 MG PO TB24
9.0000 mg | ORAL_TABLET | Freq: Every day | ORAL | Status: DC
  Administered 2020-05-17 – 2020-05-20 (×4): 9 mg via ORAL
  Filled 2020-05-17 (×6): qty 3

## 2020-05-17 MED ORDER — OLANZAPINE 10 MG PO TBDP
10.0000 mg | ORAL_TABLET | Freq: Every day | ORAL | Status: DC
  Filled 2020-05-17: qty 1

## 2020-05-17 NOTE — Progress Notes (Signed)
[facility] MD Progress Note  05/17/2020 11:59 AM [name]  MRN:  [id number] Subjective: Patient is a 24 year old female who was admitted on 05/12/2020 with an exacerbation of bipolar disorder who was involuntarily committed by her mother secondary to inappropriate and obscene messages that was sent to family members as well as threats of harm to family members.  Objective: Patient is seen and examined.  Patient is a 24 year old female with the above-stated past psychiatric history who is seen in follow-up.  She remains significantly irritable.  She stated that she is currently well, and well enough to be able to go home.  People are being discharged were "worse than me".  She denied all symptoms.  She did receive the long-acting paliperidone injection on 05/15/2020.  She denied any auditory or visual hallucinations.  She refused vital signs this morning.  She only slept 4.25 hours last night.  Her current medications include hydroxyzine, lorazepam, Zyprexa agitation protocol, paliperidone.  Her medication list from an office visit on 09/11/2019 included lorazepam 1 mg p.o. nightly and olanzapine 20 mg p.o. nightly.  EKG from 10/4 showed a sinus tachycardia with a normal QTc interval.  Principal Problem: Bipolar I disorder (HCC) Diagnosis: Principal Problem:   Bipolar I disorder (HCC) Active Problems:   Benzodiazepine dependence (HCC)  Total Time spent with patient: 20 minutes  Past Psychiatric History: See admission H&P  Past Medical History:  Past Medical History:  Diagnosis Date  . ADHD (attention deficit hyperactivity disorder)   . Allergy   . Anxiety   . Bipolar disorder (HCC)   . Eczema 12/11/13  . Schizo-affective schizophrenia (HCC)    History reviewed. No pertinent surgical history. Family History:  Family History  Problem Relation Age of Onset  . Asthma Mother   . Stroke Father   . Heart disease Father   . Asthma Father   . Hypertension Father        pulmonary hypertension   . Heart disease Maternal Uncle   . Hypertension Maternal Grandmother   . COPD Maternal Grandmother   . Diabetes Maternal Grandfather   . Stroke Paternal Grandmother   . Diabetes Paternal Grandmother   . Diabetes Paternal Grandfather   . Leukemia Maternal Uncle    Family Psychiatric  History: See admission H&P Social History:  Social History   Substance and Sexual Activity  Alcohol Use Yes   Comment: occasionally     Social History   Substance and Sexual Activity  Drug Use No    Social History   Socioeconomic History  . Marital status: Single    Spouse name: Not on file  . Number of children: Not on file  . Years of education: Not on file  . Highest education level: Not on file  Occupational History  . Occupation: Unemployed  Tobacco Use  . Smoking status: Never Smoker  . Smokeless tobacco: Never Used  Vaping Use  . Vaping Use: Never used  Substance and Sexual Activity  . Alcohol use: Yes    Comment: occasionally  . Drug use: No  . Sexual activity: Not Currently    Birth control/protection: Pill  Other Topics Concern  . Not on file  Social History Narrative   Lives with mom in an apartment on the second floor.  No children.  Currently not working.  Education: college.    Social Determinants of Health   Financial Resource Strain:   . Difficulty of Paying Living Expenses: Not on file  Food Insecurity:   .  Worried About Programme researcher, broadcasting/film/video in the Last Year: Not on file  . Ran Out of Food in the Last Year: Not on file  Transportation Needs:   . Lack of Transportation (Medical): Not on file  . Lack of Transportation (Non-Medical): Not on file  Physical Activity:   . Days of Exercise per Week: Not on file  . Minutes of Exercise per Session: Not on file  Stress:   . Feeling of Stress : Not on file  Social Connections:   . Frequency of Communication with Friends and Family: Not on file  . Frequency of Social Gatherings with Friends and Family: Not on file  .  Attends Religious Services: Not on file  . Active Member of Clubs or Organizations: Not on file  . Attends Banker Meetings: Not on file  . Marital Status: Not on file   Additional Social History:                         Sleep: Fair  Appetite:  Fair  Current Medications: Current Facility-Administered Medications  Medication Dose Route Frequency Provider Last Rate Last Admin  . acetaminophen (TYLENOL) tablet 650 mg  650 mg Oral Q6H PRN Nwoko, Uchenna E, PA      . alum & mag hydroxide-simeth (MAALOX/MYLANTA) 200-200-20 MG/5ML suspension 30 mL  30 mL Oral Q4H PRN Nwoko, Uchenna E, PA      . hydrOXYzine (ATARAX/VISTARIL) tablet 25 mg  25 mg Oral Q6H PRN Nwoko, Uchenna E, PA   25 mg at 05/16/20 2053  . LORazepam (ATIVAN) tablet 1 mg  1 mg Oral Q6H PRN Antonieta Pert, MD   1 mg at 05/16/20 0350  . OLANZapine zydis (ZYPREXA) disintegrating tablet 10 mg  10 mg Oral Q8H PRN Antonieta Pert, MD   10 mg at 05/15/20 2152   And  . LORazepam (ATIVAN) tablet 1 mg  1 mg Oral PRN Antonieta Pert, MD       And  . ziprasidone (GEODON) injection 20 mg  20 mg Intramuscular PRN Antonieta Pert, MD      . LORazepam (ATIVAN) tablet 1 mg  1 mg Oral QHS Antonieta Pert, MD   1 mg at 05/16/20 2053  . magnesium hydroxide (MILK OF MAGNESIA) suspension 30 mL  30 mL Oral Daily PRN Nwoko, Uchenna E, PA      . paliperidone (INVEGA) 24 hr tablet 12 mg  12 mg Oral QHS Karsten Ro, MD   12 mg at 05/16/20 2053    Lab Results: No results found for this or any previous visit (from the past 48 hour(s)).  Blood Alcohol level:  Lab Results  Component Value Date   ETH <10 05/11/2020   ETH <10 02/11/2018    Metabolic Disorder Labs: Lab Results  Component Value Date   HGBA1C 5.2 05/12/2020   MPG 102.54 05/12/2020   MPG 93.93 03/11/2018   No results found for: PROLACTIN Lab Results  Component Value Date   CHOL 190 05/12/2020   TRIG 38 05/12/2020   HDL 67 05/12/2020    CHOLHDL 2.8 05/12/2020   VLDL 8 05/12/2020   LDLCALC 115 (H) 05/12/2020   LDLCALC 102 (H) 08/20/2019    Physical Findings: AIMS: Facial and Oral Movements Muscles of Facial Expression: None, normal Lips and Perioral Area: None, normal Jaw: None, normal Tongue: None, normal,Extremity Movements Upper (arms, wrists, hands, fingers): None, normal Lower (legs, knees, ankles, toes): None, normal,  Trunk Movements Neck, shoulders, hips: None, normal, Overall Severity Severity of abnormal movements (highest score from questions above): None, normal Incapacitation due to abnormal movements: None, normal Patient's awareness of abnormal movements (rate only patient's report): No Awareness, Dental Status Current problems with teeth and/or dentures?: No Does patient usually wear dentures?: No  CIWA:    COWS:     Musculoskeletal: Strength & Muscle Tone: within normal limits Gait & Station: normal Patient leans: N/A  Psychiatric Specialty Exam: Physical Exam Vitals and nursing note reviewed.  Constitutional:      Appearance: Normal appearance.  HENT:     Head: Normocephalic and atraumatic.  Pulmonary:     Effort: Pulmonary effort is normal.  Neurological:     General: No focal deficit present.     Mental Status: She is alert and oriented to person, place, and time.     Review of Systems  All other systems reviewed and are negative.   Blood pressure 123/89, pulse (!) 125, temperature 98.3 F (36.8 C), temperature source Oral, resp. rate 20, height 5\' 4"  (1.626 m), weight 77.1 kg, last menstrual period 05/09/2020, SpO2 99 %.Body mass index is 29.18 kg/m.  General Appearance: Casual  Eye Contact:  Minimal  Speech:  Normal Rate  Volume:  Increased  Mood:  Irritable  Affect:  Congruent  Thought Process:  Goal Directed and Descriptions of Associations: Loose  Orientation:  Full (Time, Place, and Person)  Thought Content:  Delusions and Paranoid Ideation  Suicidal Thoughts:  No   Homicidal Thoughts:  No  Memory:  Immediate;   Fair Recent;   Fair Remote;   Fair  Judgement:  Impaired  Insight:  Lacking  Psychomotor Activity:  Increased  Concentration:  Concentration: Fair and Attention Span: Fair  Recall:  05/11/2020 of Knowledge:  Fair  Language:  Fair  Akathisia:  Negative  Handed:  Right  AIMS (if indicated):     Assets:  Desire for Improvement Housing Resilience Social Support  ADL's:  Intact  Cognition:  WNL  Sleep:  Number of Hours: 4.25     Treatment Plan Summary: Daily contact with patient to assess and evaluate symptoms and progress in treatment, Medication management and Plan : Patient is seen and examined.  Patient is a 24 year old female with the above-stated past psychiatric history who is seen in follow-up.   Diagnosis: 1.  Bipolar disorder; most recently manic, severe with psychotic features versus schizoaffective disorder; bipolar type 2.  Insomnia  Pertinent findings on examination today: 1.  Continued irritability, poor sleep, agitation. 2.  Limited insight  Plan: 1.  Add Zyprexa Zydis 10 mg p.o. nightly in the short run until paliperidone kicks in.  This is for psychosis and mood stability. 2.  Decrease paliperidone oral to 9 mg p.o. nightly for psychosis and mood stability. 3.  Patient received the long-acting paliperidone injection on 05/15/2020.  She will receive the 156 mg dosage on 10/9. 4.  Continue lorazepam 1 mg p.o. every 6 hours as needed anxiety or agitation as well as 1 mg p.o. nightly for anxiety and sleep. 5.  Continue olanzapine based agitation protocol as needed. 6.  Continue hydroxyzine 25 mg p.o. every 6 hours as needed anxiety. 7.  Disposition planning-in progress.  12/9, MD 05/17/2020, 11:59 AM

## 2020-05-17 NOTE — [facility] Suicide Risk Assessment (Signed)
[facility] INPATIENT:  Family/Significant Other Suicide Prevention Education  Suicide Prevention Education:  Contact Attempts: mother Evadne Ose [phone], has been identified by the patient as the family member/significant other with whom the patient will be residing, and identified as the person(s) who will aid the patient in the event of a mental health crisis.  With written consent from the patient, two attempts were made to provide suicide prevention education, prior to and/or following the patient's discharge.  We were unsuccessful in providing suicide prevention education.  A suicide education pamphlet was given to the patient to share with family/significant other.   Date and time of first attempt: 05/17/20 at 3:32pm CSW left a HIPAA compliant voicemail for Ms. Aundria Rud Date and time of second attempt: Second attempt still needs to be made.   Ruthann Cancer MSW, LCSW Clincal Social Worker  [facility]

## 2020-05-17 NOTE — Progress Notes (Signed)
   05/17/20 1100  Psych Admission Type (Psych Patients Only)  Admission Status Involuntary  Psychosocial Assessment  Patient Complaints Suspiciousness  Eye Contact Fair  Facial Expression Anxious  Affect Appropriate to circumstance  Speech Logical/coherent  Interaction Assertive  Motor Activity Fidgety  Appearance/Hygiene Unremarkable  Behavior Characteristics Cooperative  Mood Suspicious  Aggressive Behavior  Effect No apparent injury  Thought Process  Coherency WDL  Content Preoccupation  Delusions None reported or observed  Perception Derealization  Hallucination None reported or observed  Judgment Poor  Confusion Mild  Danger to Self  Current suicidal ideation? Denies  Danger to Others  Danger to Others None reported or observed

## 2020-05-17 NOTE — Progress Notes (Signed)
Pt continues to be paranoid and anxious on the unit.     05/17/20 2200  Psych Admission Type (Psych Patients Only)  Admission Status Involuntary  Psychosocial Assessment  Patient Complaints Worrying;Suspiciousness  Eye Contact Fair  Facial Expression Anxious  Affect Appropriate to circumstance  Speech Logical/coherent  Interaction Assertive  Motor Activity Fidgety  Appearance/Hygiene Unremarkable  Behavior Characteristics Cooperative  Mood Suspicious;Preoccupied;Pleasant  Aggressive Behavior  Effect No apparent injury  Thought Process  Coherency WDL  Content Preoccupation  Delusions None reported or observed  Perception Derealization  Hallucination None reported or observed  Judgment Poor  Confusion Mild  Danger to Self  Current suicidal ideation? Denies  Danger to Others  Danger to Others None reported or observed

## 2020-05-18 MED ORDER — METOPROLOL TARTRATE 25 MG PO TABS
12.5000 mg | ORAL_TABLET | Freq: Two times a day (BID) | ORAL | Status: DC
  Administered 2020-05-18 – 2020-05-20 (×4): 12.5 mg via ORAL
  Filled 2020-05-18 (×8): qty 0.5

## 2020-05-18 MED ORDER — DIVALPROEX SODIUM 500 MG PO DR TAB
500.0000 mg | DELAYED_RELEASE_TABLET | Freq: Every day | ORAL | Status: DC
  Administered 2020-05-18: 500 mg via ORAL
  Filled 2020-05-18 (×2): qty 1

## 2020-05-18 MED ORDER — OLANZAPINE 10 MG PO TBDP
20.0000 mg | ORAL_TABLET | Freq: Every day | ORAL | Status: DC
  Administered 2020-05-18 – 2020-05-20 (×3): 20 mg via ORAL
  Filled 2020-05-18 (×5): qty 2

## 2020-05-18 NOTE — Progress Notes (Signed)
Pt complaining of mind racing, pt given PRN Ativan per [facility]

## 2020-05-18 NOTE — [facility] Group Notes (Signed)
[facility] LCSW Group Therapy  05/18/2020 2:29 PM  Type of Therapy:  Discharge Planning  Participation Level:  Did Not Attend  Participation Quality:  Did not attend  Affect:  Did not attend  Cognitive:  Did not attend  Insight:  Did not attend  Engagement in Therapy:  Did not attend  Modes of Intervention:  Did not attend  Summary of Progress/Problems: This patient was invited to attend group by CSW, however declined attendance.  Kasiya Burck A Yuvonne Lanahan 05/18/2020, 2:29 PM

## 2020-05-18 NOTE — [facility] Suicide Risk Assessment (Signed)
[facility] INPATIENT:  Family/Significant Other Suicide Prevention Education  Suicide Prevention Education:  Contact Attempts: [name] [phone] (Mother) has been identified by the patient as the family member/significant other with whom the patient will be residing, and identified as the person(s) who will aid the patient in the event of a mental health crisis.  With written consent from the patient, two attempts were made to provide suicide prevention education, prior to and/or following the patient's discharge.  We were unsuccessful in providing suicide prevention education.  A suicide education pamphlet was given to the patient to share with family/significant other.  Date and time of first attempt: 05/17/20 at 3:32pm Date and time of second attempt: 05/18/20 at 3:25pm  Pamphlet provided to and reviewed with patient.   [name] 05/18/2020, 3:34 PM

## 2020-05-18 NOTE — [facility] Group Notes (Signed)
Patient did not attend orientation group.  

## 2020-05-18 NOTE — Progress Notes (Signed)
Adult Psychoeducational Group Note  Date:  05/18/2020 Time:  2:46 AM  Group Topic/Focus:  Wrap-Up Group:   The focus of this group is to help patients review their daily goal of treatment and discuss progress on daily workbooks.  Participation Level:  Active  Participation Quality:  Appropriate  Affect:  Appropriate  Cognitive:  Disorganized and Confused  Insight: Limited  Engagement in Group:  Engaged  Modes of Intervention:  Discussion  Additional Comments:  Pt stated her goal for today was to focus on her treatment plan and talk with her doctor about her discharge plan. Pt stated she felt she accomplished her goals today. Pt stated she talk with her doctor and social worker, regarding her care today.  Pt stated she took all her medication today from her providers. Pt rated her overall day a 4 out of 10. Pt stated her appetite was pretty good today and she attended all meals. Pt stated her sleep last night was poor. Pt nurse was made aware of situation. Pt stated the goal for tonight was to get some rest. Pt stated she was in no physical pain. Pt deny auditory or visual hallucinations. Pt denies thoughts of harming herself or others. Pt stated she would alert staff if anything changes.  [name] 05/18/2020, 2:46 AM

## 2020-05-18 NOTE — Progress Notes (Signed)
Pt continues to be paranoid    05/18/20 2300  Psych Admission Type (Psych Patients Only)  Admission Status Involuntary  Psychosocial Assessment  Patient Complaints None  Eye Contact Fair  Facial Expression Other (Comment) (sleepy )  Affect Appropriate to circumstance  Speech Logical/coherent  Interaction Assertive  Motor Activity Lethargic (spoke with patient upon awakening)  Appearance/Hygiene Unremarkable  Behavior Characteristics Cooperative  Mood Preoccupied  Thought Process  Coherency WDL  Content Preoccupation  Delusions None reported or observed  Perception UTA  Hallucination None reported or observed  Judgment Poor  Confusion UTA  Danger to Self  Current suicidal ideation? Denies  Danger to Others  Danger to Others None reported or observed

## 2020-05-18 NOTE — Progress Notes (Signed)
[facility] MD Progress Note  05/18/2020 11:04 AM [name]  MRN:  [id number] Subjective:  Pt is seen and examined today. Pt states her mood is good . She rates her mood at 8/10 (10 is the best mood). Pt states she slept well last night. Pt states her appetite is good and she ate all her meals. Pt rates her anxiety at 0/10 (10 is the worst anxiety). She states she wants to go home as he is not paranoid today and she is not talking to herself. She states she was thinking of not going to her mom previously because of paranoia but now she wants to go to her mom. She states "I don't have to change my environment because my mom is supportive but I have to change myself and how I think". Currently, Pt denies any suicidal ideation, homicidal ideation and, visual and auditory hallucination. Pt denies any headache, nausea, vomiting, dizziness, chest pain, SOB, abdominal pain, diarrhea, and constipation. Pt denies any medication side effects and has been tolerating it well. Pt denies any concerns.  Objective: Pt is 24 year old female with past history of Bipolar disorder presented to [facility] on 05/11/2020 with GPD, IVCed by her Mom. Family indicated patient sent inappropriate/obscene messages to them, and made threats of harm against family member(s).  Nursing notes indicate that Pt slept for 4.5 hours. Pt was paranoid and anxious at night.  Blood pressure (!) 138/93, pulse (!) 150, temperature 98 F (36.7 C), temperature source Oral, resp. rate 20, height 5\' 4"  (1.626 m), weight 77.1 kg, last menstrual period 05/09/2020, SpO2 99 %. Ph conversation with Mom- Mom states she called her multiple times yesterday evening but today she was sounding much better. She states she is changing locks in her house and will limit her access to phone.  Principal Problem: Bipolar I disorder (HCC) Diagnosis: Principal Problem:   Bipolar I disorder (HCC) Active Problems:   Benzodiazepine dependence (HCC)  Total Time spent with  patient: 20 minutes  Past Psychiatric History: see H&P  Past Medical History:  Past Medical History:  Diagnosis Date  . ADHD (attention deficit hyperactivity disorder)   . Allergy   . Anxiety   . Bipolar disorder (HCC)   . Eczema 12/11/13  . Schizo-affective schizophrenia (HCC)    History reviewed. No pertinent surgical history. Family History:  Family History  Problem Relation Age of Onset  . Asthma Mother   . Stroke Father   . Heart disease Father   . Asthma Father   . Hypertension Father        pulmonary hypertension  . Heart disease Maternal Uncle   . Hypertension Maternal Grandmother   . COPD Maternal Grandmother   . Diabetes Maternal Grandfather   . Stroke Paternal Grandmother   . Diabetes Paternal Grandmother   . Diabetes Paternal Grandfather   . Leukemia Maternal Uncle    Family Psychiatric  History: See H&P Social History:  Social History   Substance and Sexual Activity  Alcohol Use Yes   Comment: occasionally     Social History   Substance and Sexual Activity  Drug Use No    Social History   Socioeconomic History  . Marital status: Single    Spouse name: Not on file  . Number of children: Not on file  . Years of education: Not on file  . Highest education level: Not on file  Occupational History  . Occupation: Unemployed  Tobacco Use  . Smoking status: Never Smoker  .  Smokeless tobacco: Never Used  Vaping Use  . Vaping Use: Never used  Substance and Sexual Activity  . Alcohol use: Yes    Comment: occasionally  . Drug use: No  . Sexual activity: Not Currently    Birth control/protection: Pill  Other Topics Concern  . Not on file  Social History Narrative   Lives with mom in an apartment on the second floor.  No children.  Currently not working.  Education: college.    Social Determinants of Health   Financial Resource Strain:   . Difficulty of Paying Living Expenses: Not on file  Food Insecurity:   . Worried About Programme researcher, broadcasting/film/video  in the Last Year: Not on file  . Ran Out of Food in the Last Year: Not on file  Transportation Needs:   . Lack of Transportation (Medical): Not on file  . Lack of Transportation (Non-Medical): Not on file  Physical Activity:   . Days of Exercise per Week: Not on file  . Minutes of Exercise per Session: Not on file  Stress:   . Feeling of Stress : Not on file  Social Connections:   . Frequency of Communication with Friends and Family: Not on file  . Frequency of Social Gatherings with Friends and Family: Not on file  . Attends Religious Services: Not on file  . Active Member of Clubs or Organizations: Not on file  . Attends Banker Meetings: Not on file  . Marital Status: Not on file   Additional Social History:                         Sleep: Fair  Appetite:  Good  Current Medications: Current Facility-Administered Medications  Medication Dose Route Frequency Provider Last Rate Last Admin  . acetaminophen (TYLENOL) tablet 650 mg  650 mg Oral Q6H PRN Nwoko, Uchenna E, PA      . alum & mag hydroxide-simeth (MAALOX/MYLANTA) 200-200-20 MG/5ML suspension 30 mL  30 mL Oral Q4H PRN Nwoko, Uchenna E, PA      . hydrOXYzine (ATARAX/VISTARIL) tablet 25 mg  25 mg Oral Q6H PRN Nwoko, Uchenna E, PA   25 mg at 05/16/20 2053  . LORazepam (ATIVAN) tablet 1 mg  1 mg Oral Q6H PRN Antonieta Pert, MD   1 mg at 05/17/20 2111  . OLANZapine zydis (ZYPREXA) disintegrating tablet 10 mg  10 mg Oral Q8H PRN Antonieta Pert, MD   10 mg at 05/15/20 2152   And  . LORazepam (ATIVAN) tablet 1 mg  1 mg Oral PRN Antonieta Pert, MD       And  . ziprasidone (GEODON) injection 20 mg  20 mg Intramuscular PRN Antonieta Pert, MD      . LORazepam (ATIVAN) tablet 1 mg  1 mg Oral QHS Antonieta Pert, MD   1 mg at 05/17/20 2110  . magnesium hydroxide (MILK OF MAGNESIA) suspension 30 mL  30 mL Oral Daily PRN Nwoko, Uchenna E, PA      . OLANZapine zydis (ZYPREXA) disintegrating tablet  20 mg  20 mg Oral QHS Antonieta Pert, MD      . paliperidone (INVEGA) 24 hr tablet 9 mg  9 mg Oral QHS Antonieta Pert, MD   9 mg at 05/17/20 2110    Lab Results: No results found for this or any previous visit (from the past 48 hour(s)).  Blood Alcohol level:  Lab Results  Component  Value Date   ETH <10 05/11/2020   ETH <10 02/11/2018    Metabolic Disorder Labs: Lab Results  Component Value Date   HGBA1C 5.2 05/12/2020   MPG 102.54 05/12/2020   MPG 93.93 03/11/2018   No results found for: PROLACTIN Lab Results  Component Value Date   CHOL 190 05/12/2020   TRIG 38 05/12/2020   HDL 67 05/12/2020   CHOLHDL 2.8 05/12/2020   VLDL 8 05/12/2020   LDLCALC 115 (H) 05/12/2020   LDLCALC 102 (H) 08/20/2019    Physical Findings: AIMS: Facial and Oral Movements Muscles of Facial Expression: None, normal Lips and Perioral Area: None, normal Jaw: None, normal Tongue: None, normal,Extremity Movements Upper (arms, wrists, hands, fingers): None, normal Lower (legs, knees, ankles, toes): None, normal, Trunk Movements Neck, shoulders, hips: None, normal, Overall Severity Severity of abnormal movements (highest score from questions above): None, normal Incapacitation due to abnormal movements: None, normal Patient's awareness of abnormal movements (rate only patient's report): No Awareness, Dental Status Current problems with teeth and/or dentures?: No Does patient usually wear dentures?: No  CIWA:    COWS:     Musculoskeletal: Strength & Muscle Tone: within normal limits Gait & Station: normal Patient leans: N/A  Psychiatric Specialty Exam: Physical Exam Vitals and nursing note reviewed.  Constitutional:      General: She is not in acute distress.    Appearance: Normal appearance. She is obese. She is not ill-appearing or toxic-appearing.  HENT:     Head: Normocephalic and atraumatic.  Pulmonary:     Effort: Pulmonary effort is normal.  Neurological:     General:  No focal deficit present.     Mental Status: She is alert and oriented to person, place, and time.     Review of Systems  Constitutional: Negative for activity change, appetite change, chills, fatigue and fever.  Respiratory: Negative.   Cardiovascular: Negative.   Genitourinary: Negative.   Musculoskeletal: Negative.   Neurological: Negative.   Psychiatric/Behavioral: Negative for behavioral problems, dysphoric mood, hallucinations and suicidal ideas. The patient is not nervous/anxious.     Blood pressure (!) 138/93, pulse (!) 150, temperature 98 F (36.7 C), temperature source Oral, resp. rate 20, height 5\' 4"  (1.626 m), weight 77.1 kg, last menstrual period 05/09/2020, SpO2 99 %.Body mass index is 29.18 kg/m.  General Appearance: Casual  Eye Contact:  Fair  Speech:  Normal Rate  Volume:  Normal  Mood:  Euthymic  Affect:  Congruent  Thought Process:  Goal Directed and Descriptions of Associations: Loose  Orientation:  Full (Time, Place, and Person)  Thought Content:  Hallucinations: None  Suicidal Thoughts:  No  Homicidal Thoughts:  No  Memory:  Immediate;   Fair Recent;   Fair Remote;   Fair  Judgement:  Impaired  Insight:  Lacking  Psychomotor Activity:  Normal  Concentration:  Concentration: Fair and Attention Span: Fair  Recall:  FiservFair  Fund of Knowledge:  Fair  Language:  Fair  Akathisia:  Negative  Handed:  Right  AIMS (if indicated):     Assets:  Desire for Improvement Housing Resilience Social Support  ADL's:  Intact  Cognition:  WNL  Sleep:  Number of Hours: 4.25     Treatment Plan Summary:Pt is 24 year old female with past history of Bipolar disorder [facility] with GPD, IVCed by her Mom. Family indicated patient sent inappropriate/obscene messages to them, and made threats of harm against family member(s).  Nursing notes indicate that Pt  slept for 4.5 hours. Pt was paranoid and anxious at night.  Blood pressure (!) 138/93, pulse (!)  150, temperature 98 F (36.7 C), temperature source Oral, resp. rate 20, height 5\' 4"  (1.626 m), weight 77.1 kg, last menstrual period 05/09/2020, SpO2 99 % Plan-  Daily contact with patient to assess and evaluate symptoms and progress in treatment Continue inpatient hospitalization. -Patient received Invega Sustenna 234 mg on 05/15/2020 -Invega sustenna 156 mg is planned for 05/21/2020. -Increase Zyprexa to 20 mg Nightly -Continue Invega  9 mg PO QHS for psychosis/mood instability -Continue Ativan 1 mg PO Q6H PRN for anxiety/Agitation -Continue Ativan 1 mg PO QHS for anxiety/insomnia -Continue Vistaril 25 mg PO Q6HR PRN anxiety  -Continue Agitation Protocol. -Patient will participate in the therapeutic group milieu.  Discharge disposition in progress.  07/21/2020, MD 05/18/2020, 11:04 AM

## 2020-05-18 NOTE — Progress Notes (Signed)
D: Patient presents with pleasant affect upon awakening. Patient reports they slept well. Patient denies SI/HI at this time. Patient also denies AH/VH at this time. Patient aware to notify staff or RN if this changes. Patient complained of constipation later in the day. Patient was cooperative and pleasant throughout the day. A: Provided positive reinforcement and encouragement.  R: Patient cooperative and receptive to efforts.  [place] NOVEL CORONAVIRUS (COVID-19) DAILY CHECK-OFF SYMPTOMS - answer yes or no to each - every day NO YES  Have you had a fever in the past 24 hours?   Fever (Temp > 37.80C / 100F) X    Have you had any of these symptoms in the past 24 hours?  New Cough   Sore Throat    Shortness of Breath   Difficulty Breathing   Unexplained Body Aches   X    Have you had any one of these symptoms in the past 24 hours not related to allergies?    Runny Nose   Nasal Congestion   Sneezing   X    If you have had runny nose, nasal congestion, sneezing in the past 24 hours, has it worsened?   X    EXPOSURES - check yes or no X    Have you traveled outside the state in the past 14 days?   X    Have you been in contact with someone with a confirmed diagnosis of COVID-19 or PUI in the past 14 days without wearing appropriate PPE?   X    Have you been living in the same home as a person with confirmed diagnosis of COVID-19 or a PUI (household contact)?     X    Have you been diagnosed with COVID-19?     X                                                                                                                             What to do next: Answered NO to all: Answered YES to anything:    Proceed with unit schedule Follow the [facility] Inpatient Flowsheet.

## 2020-05-19 MED ORDER — PALIPERIDONE PALMITATE ER 156 MG/ML IM SUSY
156.0000 mg | PREFILLED_SYRINGE | Freq: Once | INTRAMUSCULAR | Status: AC
  Administered 2020-05-19: 156 mg via INTRAMUSCULAR
  Filled 2020-05-19: qty 1

## 2020-05-19 MED ORDER — DIVALPROEX SODIUM 250 MG PO DR TAB
750.0000 mg | DELAYED_RELEASE_TABLET | Freq: Every day | ORAL | Status: DC
  Administered 2020-05-19 – 2020-05-20 (×2): 750 mg via ORAL
  Filled 2020-05-19 (×4): qty 3

## 2020-05-19 NOTE — Progress Notes (Signed)
Recreation Therapy Notes  Date: 10.7.21 Time: 1000 Location: 500 Hall Dayroom  Group Topic: Wellness  Goal Area(s) Addresses:  Patient will define components of whole wellness. Patient will verbalize benefit of whole wellness.  Intervention: Music  Activity: Exercise.  LRT led patients in a series of stretches.  Patients then took turns leading group in exercises of their choosing.  Patients could take breaks and get water as needed.  Education: Wellness, Building control surveyor.   Education Outcome: Acknowledges education/In group clarification offered/Needs additional education.   Clinical Observations/Feedback: Pt did not attend group session.     Caroll Rancher, LRT/CTRS         Caroll Rancher A 05/19/2020 10:44 AM

## 2020-05-19 NOTE — [facility] Group Notes (Signed)
Occupational Therapy Group Note Date: 05/19/2020 Group Topic/Focus: Relaxation  Group Description: Group encouraged increased engagement and participation through a guided imagery mindfulness practice to promote relaxation and symptom management. Patients were encouraged to share their own relaxation strategies/practices and were then encouraged to follow along with the guided imagery script provided. Patients reported benefit and more relaxed post group.   Therapeutic Goal(s): Identify personal relaxation strategies and techniques to reduce stress and emotional distress  Participation Level:  Patient did not attend OT group session despite personal invitation.     Plan: Continue to engage patient in OT groups 2 - 3x/week.  05/19/2020  Marquinn Meschke, MOT, OTR/L 

## 2020-05-19 NOTE — [facility] Group Notes (Signed)
Patient did not attend orientation group.  

## 2020-05-19 NOTE — Progress Notes (Signed)
[facility] MD Progress Note  05/19/2020 11:38 AM [name]  MRN:  [id number] Subjective:  Pt is seen and examined today. Pt states her mood is good today. She rates her mood at 9/10 (10 is the best mood). Pt states she slept well last night. Pt states her appetite is good and she ate all her meals. Pt rates her anxiety at 0/10 (10 is the worst anxiety). She states she was feeling paranoid yesterday night and her mind was racing but she doesn't have it right now. She states at night she has thoughts that her sister is being raped. She states she is upset and doesn't know why these thoughts are coming in her mind. She is upset today as she can't go home today. She states its has been about 9 days since she is in the hospital and she wants to go home. She states she was thinking of not going to her mom previously because of paranoia but now she wants to go to her mom. She states she had nausea and she threw up once but denies it now. Currently,Pt denies any suicidal ideation, homicidal ideation and, visual and auditory hallucination. Pt denies any headache, nausea, vomiting, dizziness, chest pain, SOB, abdominal pain, diarrhea, and constipation. Pt denies any medication side effects and has been tolerating it well. Pt denies any concerns.  Objective: Pt is 24 year old female with past history of Bipolar disorder presented to [facility] on 05/11/2020 with GPD, IVCed by her Mom. Family indicatedpatient sentinappropriate/obscene messages to them, and made threats of harm against family member(s). On Examination, Pt is focused on discharge. She looks little tired and sleepy but less anxious than before. She is little paranoid, she closed the door twice during the evaluation. She is oriented x 3. Pt's mood is euthymic and affect is congruent.  Pt's judgement is impaired and she lacks insight. No SI, HI and AVH. Blood pressure 108/64, pulse (!) 103, temperature 98 F (36.7 C), temperature source Oral, resp. rate 20,  height 5\' 4"  (1.626 m), weight 77.1 kg, last menstrual period 05/09/2020, SpO2 99 %. Nursing notes indicate that Pt slept 5.75 hours last night. This patient was invited to attend group by CSW, however declined attendance. Last night, Pt complained of mind racing, pt given PRN Ativan. Pt continued to be paranoid at night time.Pt did not attend group session and orientation group this morning. Pt was started on Depakote yesterday. Pt is tolerating it well with no side effects. Pt received 156   Principal Problem: Bipolar I disorder (HCC) Diagnosis: Principal Problem:   Bipolar I disorder (HCC) Active Problems:   Benzodiazepine dependence (HCC)  Total Time spent with patient: 20 minutes  Past Psychiatric History: see H&P  Past Medical History:  Past Medical History:  Diagnosis Date   ADHD (attention deficit hyperactivity disorder)    Allergy    Anxiety    Bipolar disorder (HCC)    Eczema 12/11/13   Schizo-affective schizophrenia (HCC)    History reviewed. No pertinent surgical history. Family History:  Family History  Problem Relation Age of Onset   Asthma Mother    Stroke Father    Heart disease Father    Asthma Father    Hypertension Father        pulmonary hypertension   Heart disease Maternal Uncle    Hypertension Maternal Grandmother    COPD Maternal Grandmother    Diabetes Maternal Grandfather    Stroke Paternal Grandmother    Diabetes Paternal Grandmother  Diabetes Paternal Grandfather    Leukemia Maternal Uncle    Family Psychiatric  History: See H&P Social History:  Social History   Substance and Sexual Activity  Alcohol Use Yes   Comment: occasionally     Social History   Substance and Sexual Activity  Drug Use No    Social History   Socioeconomic History   Marital status: Single    Spouse name: Not on file   Number of children: Not on file   Years of education: Not on file   Highest education level: Not on file   Occupational History   Occupation: Unemployed  Tobacco Use   Smoking status: Never Smoker   Smokeless tobacco: Never Used  Building services engineer Use: Never used  Substance and Sexual Activity   Alcohol use: Yes    Comment: occasionally   Drug use: No   Sexual activity: Not Currently    Birth control/protection: Pill  Other Topics Concern   Not on file  Social History Narrative   Lives with mom in an apartment on the second floor.  No children.  Currently not working.  Education: college.    Social Determinants of Health   Financial Resource Strain:    Difficulty of Paying Living Expenses: Not on file  Food Insecurity:    Worried About Programme researcher, broadcasting/film/video in the Last Year: Not on file   [employer] of Food in the Last Year: Not on file  Transportation Needs:    Lack of Transportation (Medical): Not on file   Lack of Transportation (Non-Medical): Not on file  Physical Activity:    Days of Exercise per Week: Not on file   Minutes of Exercise per Session: Not on file  Stress:    Feeling of Stress : Not on file  Social Connections:    Frequency of Communication with Friends and Family: Not on file   Frequency of Social Gatherings with Friends and Family: Not on file   Attends Religious Services: Not on file   Active Member of Clubs or Organizations: Not on file   Attends Banker Meetings: Not on file   Marital Status: Not on file   Additional Social History:                         Sleep: Fair  Appetite:  Good  Current Medications: Current Facility-Administered Medications  Medication Dose Route Frequency Provider Last Rate Last Admin   acetaminophen (TYLENOL) tablet 650 mg  650 mg Oral Q6H PRN Nwoko, Uchenna E, PA       alum & mag hydroxide-simeth (MAALOX/MYLANTA) 200-200-20 MG/5ML suspension 30 mL  30 mL Oral Q4H PRN Nwoko, Uchenna E, PA   30 mL at 05/18/20 1441   divalproex (DEPAKOTE) DR tablet 500 mg  500 mg Oral Q supper  Antonieta Pert, MD   500 mg at 05/18/20 1736   hydrOXYzine (ATARAX/VISTARIL) tablet 25 mg  25 mg Oral Q6H PRN Nwoko, Uchenna E, PA   25 mg at 05/16/20 2053   LORazepam (ATIVAN) tablet 1 mg  1 mg Oral Q6H PRN Antonieta Pert, MD   1 mg at 05/18/20 2313   OLANZapine zydis (ZYPREXA) disintegrating tablet 10 mg  10 mg Oral Q8H PRN Antonieta Pert, MD   10 mg at 05/15/20 2152   And   LORazepam (ATIVAN) tablet 1 mg  1 mg Oral PRN Antonieta Pert, MD  And   ziprasidone (GEODON) injection 20 mg  20 mg Intramuscular PRN Antonieta Pertlary, Greg Lawson, MD       LORazepam (ATIVAN) tablet 1 mg  1 mg Oral QHS Antonieta Pertlary, Greg Lawson, MD   1 mg at 05/18/20 2102   magnesium hydroxide (MILK OF MAGNESIA) suspension 30 mL  30 mL Oral Daily PRN Nwoko, Uchenna E, PA       metoprolol tartrate (LOPRESSOR) tablet 12.5 mg  12.5 mg Oral BID Antonieta Pertlary, Greg Lawson, MD   12.5 mg at 05/19/20 0818   OLANZapine zydis (ZYPREXA) disintegrating tablet 20 mg  20 mg Oral QHS Antonieta Pertlary, Greg Lawson, MD   20 mg at 05/18/20 2102   paliperidone (INVEGA) 24 hr tablet 9 mg  9 mg Oral QHS Antonieta Pertlary, Greg Lawson, MD   9 mg at 05/18/20 2102    Lab Results: No results found for this or any previous visit (from the past 48 hour(s)).  Blood Alcohol level:  Lab Results  Component Value Date   ETH <10 05/11/2020   ETH <10 02/11/2018    Metabolic Disorder Labs: Lab Results  Component Value Date   HGBA1C 5.2 05/12/2020   MPG 102.54 05/12/2020   MPG 93.93 03/11/2018   No results found for: PROLACTIN Lab Results  Component Value Date   CHOL 190 05/12/2020   TRIG 38 05/12/2020   HDL 67 05/12/2020   CHOLHDL 2.8 05/12/2020   VLDL 8 05/12/2020   LDLCALC 115 (H) 05/12/2020   LDLCALC 102 (H) 08/20/2019    Physical Findings: AIMS: Facial and Oral Movements Muscles of Facial Expression: None, normal Lips and Perioral Area: None, normal Jaw: None, normal Tongue: None, normal,Extremity Movements Upper (arms, wrists, hands,  fingers): None, normal Lower (legs, knees, ankles, toes): None, normal, Trunk Movements Neck, shoulders, hips: None, normal, Overall Severity Severity of abnormal movements (highest score from questions above): None, normal Incapacitation due to abnormal movements: None, normal Patient's awareness of abnormal movements (rate only patient's report): No Awareness, Dental Status Current problems with teeth and/or dentures?: No Does patient usually wear dentures?: No  CIWA:    COWS:     Musculoskeletal: Strength & Muscle Tone: within normal limits Gait & Station: normal Patient leans: N/A  Psychiatric Specialty Exam: Physical Exam Vitals and nursing note reviewed.  Constitutional:      General: She is not in acute distress.    Appearance: Normal appearance. She is not ill-appearing or toxic-appearing.  Neurological:     Mental Status: She is alert.     Review of Systems  Constitutional: Negative.   HENT: Negative.   Eyes: Negative.   Respiratory: Negative.   Cardiovascular: Negative.   Gastrointestinal: Negative.   Genitourinary: Negative.   Musculoskeletal: Negative.   Psychiatric/Behavioral: Negative for agitation, dysphoric mood, hallucinations, sleep disturbance and suicidal ideas. The patient is not nervous/anxious.     Blood pressure 108/64, pulse (!) 103, temperature 98 F (36.7 C), temperature source Oral, resp. rate 20, height 5\' 4"  (1.626 m), weight 77.1 kg, last menstrual period 05/09/2020, SpO2 99 %.Body mass index is 29.18 kg/m.  General Appearance: Casual  Eye Contact:  Fair  Speech:  Normal Rate  Volume:  Normal  Mood:  Euthymic  Affect:  Congruent  Thought Process:  Goal Directed and Descriptions of Associations: Loose  Orientation:  Full (Time, Place, and Person)  Thought Content:  Hallucinations: None  Suicidal Thoughts:  No  Homicidal Thoughts:  No  Memory:  Immediate;   Fair Recent;   Fair Remote;  Fair  Judgement:  Impaired  Insight:  Lacking   Psychomotor Activity:  Normal  Concentration:  Concentration: Fair and Attention Span: Fair  Recall:  [employer] of Knowledge:  Fair  Language:  Fair  Akathisia:  Negative  Handed:  Right  AIMS (if indicated):     Assets:  Desire for Improvement Housing Resilience Social Support  ADL's:  Intact  Cognition:  WNL  Sleep:  Number of Hours: 5.75     Treatment Plan Summary:Pt is 24 year old female with past history of Bipolar disorder presented to [facility] on 05/11/2020 with GPD, IVCed by her Mom. Family indicatedpatient sentinappropriate/obscene messages to them, and made threats of harm against family member(s). On Examination, Pt is focused on discharge. She looks little tired and sleepy but less anxious than before. She is little paranoid, she closed the door twice during the evaluation. She is oriented x 3. Pt's mood is euthymic and affect is congruent.  Pt's judgement is impaired and she lacks insight. No SI, HI and AVH. Blood pressure 108/64, pulse (!) 103, temperature 98 F (36.7 C), temperature source Oral, resp. rate 20, height 5\' 4"  (1.626 m), weight 77.1 kg, last menstrual period 05/09/2020, SpO2 99 %. Plan-  Daily contact with patient to assess and evaluate symptoms and progress in treatment Continue inpatient hospitalization. -Patient received Invega Sustenna 234 mg on 05/15/2020 -Invega sustenna 156 mg is ordered today. Pt will receive it today. -Increase Depakote to 750 mg from tonight. -Continue Zyprexa 20 mg Nightly  -ContinueInvega  9mg  PO QHS for psychosis/mood instability -Continue Ativan 1 mg PO Q6H PRN for anxiety/Agitation -Continue Ativan 1 mg PO QHS for anxiety/insomnia -Continue Vistaril 25 mg PO Q6HR PRN anxiety  -Continue Agitation Protocol. -Patient will participate in the therapeutic group milieu.  07/15/2020, MD 05/19/2020, 11:38 AM

## 2020-05-19 NOTE — Progress Notes (Signed)
D: Patient presents with pleasant affect. Patient states they woke up in the middle of their sleep because they were having a seizure. Patient denies SI/HI at this time. Patient also denies VH but states they are positive for AH that are telling them they will be raped and their mom is going to die. Patient aware to notify staff or RN if this changes.  A: Provided positive reinforcement and encouragement. Provided patient with redirection.  R: Patient cooperative and receptive to efforts. [place] NOVEL CORONAVIRUS (COVID-19) DAILY CHECK-OFF SYMPTOMS - answer yes or no to each - every day NO YES  Have you had a fever in the past 24 hours?   Fever (Temp > 37.80C / 100F) X    Have you had any of these symptoms in the past 24 hours?  New Cough   Sore Throat    Shortness of Breath   Difficulty Breathing   Unexplained Body Aches   X    Have you had any one of these symptoms in the past 24 hours not related to allergies?    Runny Nose   Nasal Congestion   Sneezing   X    If you have had runny nose, nasal congestion, sneezing in the past 24 hours, has it worsened?   X    EXPOSURES - check yes or no X    Have you traveled outside the state in the past 14 days?   X    Have you been in contact with someone with a confirmed diagnosis of COVID-19 or PUI in the past 14 days without wearing appropriate PPE?   X    Have you been living in the same home as a person with confirmed diagnosis of COVID-19 or a PUI (household contact)?     X    Have you been diagnosed with COVID-19?     X                                                                                                                             What to do next: Answered NO to all: Answered YES to anything:    Proceed with unit schedule Follow the [facility] Inpatient Flowsheet.

## 2020-05-20 MED ORDER — METOPROLOL TARTRATE 25 MG PO TABS
25.0000 mg | ORAL_TABLET | ORAL | Status: AC
  Administered 2020-05-20: 25 mg via ORAL
  Filled 2020-05-20: qty 1

## 2020-05-20 MED ORDER — METOPROLOL TARTRATE 25 MG PO TABS
25.0000 mg | ORAL_TABLET | Freq: Two times a day (BID) | ORAL | Status: DC
  Administered 2020-05-20 – 2020-05-21 (×2): 25 mg via ORAL
  Filled 2020-05-20 (×7): qty 1

## 2020-05-20 NOTE — Progress Notes (Signed)
[place] NOVEL CORONAVIRUS (COVID-19) DAILY CHECK-OFF SYMPTOMS - answer yes or no to each - every day NO YES  Have you had a fever in the past 24 hours?  . Fever (Temp > 37.80C / 100F) X   Have you had any of these symptoms in the past 24 hours? . New Cough .  Sore Throat  .  Shortness of Breath .  Difficulty Breathing .  Unexplained Body Aches   X   Have you had any one of these symptoms in the past 24 hours not related to allergies?   . Runny Nose .  Nasal Congestion .  Sneezing   X   If you have had runny nose, nasal congestion, sneezing in the past 24 hours, has it worsened?  X   EXPOSURES - check yes or no X   Have you traveled outside the state in the past 14 days?  X   Have you been in contact with someone with a confirmed diagnosis of COVID-19 or PUI in the past 14 days without wearing appropriate PPE?  X   Have you been living in the same home as a person with confirmed diagnosis of COVID-19 or a PUI (household contact)?    X   Have you been diagnosed with COVID-19?    X              What to do next: Answered NO to all: Answered YES to anything:   Proceed with unit schedule Follow the [facility] Inpatient Flowsheet.   Pt visible in bed on initial approach. Presents guarded with flat affect observed to be cautious on interactions. Denies SI, HI and VH when assessed. Endorses AH "I still hear voices" and still believes her sister is getting rape while she's hospitalized. Pt attended spiritual group but not psycho-educational. Pt pulse rate was elevated earlier this shift 1320 bpm to 138 bpm on dinamap and between 112 bpm to 118 bpm manually. Support and encouragement offered. Safety checks maintained without incidents. Dr. Jola Babinski made aware and new order received for Metoprolol 25 mg, an increase from 12/5 mg. All medications given as ordered with verbal education and effects monitored. Writer encouraged pt to voice concerns.  Pt tolerates all medications and meals well. Denies  adverse drug reactions. Cooperative with care. Denies concerns at this time.

## 2020-05-20 NOTE — Progress Notes (Signed)
Pt appeared to interact on the unit with peers better. Pt tries to appear as if she is not responding to internal stimuli. Pt stated she was ready to go, pt stated she felt like she was improving. Pt appears to be having issues at times with her emotions, but has been able to maintain her composure.      05/20/20 2200  Psych Admission Type (Psych Patients Only)  Admission Status Involuntary  Psychosocial Assessment  Patient Complaints Suspiciousness  Eye Contact Fair  Facial Expression Other (Comment) (sleepy )  Affect Appropriate to circumstance  Speech Logical/coherent  Interaction Assertive  Motor Activity Lethargic (patient presented upon awakening )  Appearance/Hygiene Unremarkable  Behavior Characteristics Cooperative  Mood Suspicious;Preoccupied  Thought Process  Coherency WDL  Content Preoccupation  Delusions None reported or observed  Perception Hallucinations (patient complains of auditory hallucinations)  Hallucination Auditory  Judgment Poor  Confusion None  Danger to Self  Current suicidal ideation? Denies  Danger to Others  Danger to Others None reported or observed

## 2020-05-20 NOTE — [facility] Suicide Risk Assessment (Signed)
[facility] INPATIENT:  Family/Significant Other Suicide Prevention Education  Suicide Prevention Education:  Education Completed; Quentina Fronek [phone] ,  (name of family member/significant other) has been identified by the patient as the family member/significant other with whom the patient will be residing, and identified as the person(s) who will aid the patient in the event of a mental health crisis (suicidal ideations/suicide attempt).  With written consent from the patient, the family member/significant other has been provided the following suicide prevention education, prior to the and/or following the discharge of the patient.  The suicide prevention education provided includes the following:  Suicide risk factors  Suicide prevention and interventions  National Suicide Hotline telephone number  [facility] assessment telephone number  [facility] Emergency Assistance 911  [facility] and/or Residential Mobile Crisis Unit telephone number  Request made of family/significant other to:  Remove weapons (e.g., guns, rifles, knives), all items previously/currently identified as safety concern.    Remove drugs/medications (over-the-counter, prescriptions, illicit drugs), all items previously/currently identified as a safety concern.  The family member/significant other verbalizes understanding of the suicide prevention education information provided.  The family member/significant other agrees to remove the items of safety concern listed above.  CSW contacted pt sister and provided update on pt care and d/c plans. Sister confirmed that pt can return home with pt mother and that sister and mom would be able to pick up on 05/21/20.   Erin Sons 05/20/2020, 3:15 PM

## 2020-05-20 NOTE — [facility] Suicide Risk Assessment (Signed)
[facility] INPATIENT:  Family/Significant Other Suicide Prevention Education  Suicide Prevention Education:  Education Completed; Knee,Jaclyn (Mother)  [phone] (Home Phone),  (name of family member/significant other) has been identified by the patient as the family member/significant other with whom the patient will be residing, and identified as the person(s) who will aid the patient in the event of a mental health crisis (suicidal ideations/suicide attempt).  With written consent from the patient, the family member/significant other has been provided the following suicide prevention education, prior to the and/or following the discharge of the patient.  The suicide prevention education provided includes the following:  Suicide risk factors  Suicide prevention and interventions  National Suicide Hotline telephone number  [facility] assessment telephone number  [facility] Emergency Assistance 911  [facility] and/or Residential Mobile Crisis Unit telephone number  Request made of family/significant other to:  Remove weapons (e.g., guns, rifles, knives), all items previously/currently identified as safety concern.    Remove drugs/medications (over-the-counter, prescriptions, illicit drugs), all items previously/currently identified as a safety concern.  The family member/significant other verbalizes understanding of the suicide prevention education information provided.  The family member/significant other agrees to remove the items of safety concern listed above.  Pt mother states that pt is able to come home to her. Mom plans to put lock on her door and hide knives in her room.  CSW provided updates on pt's aftercare plans.   Erin Sons 05/20/2020, 3:15 PM

## 2020-05-20 NOTE — [facility] Counselor (Signed)
CSW called [name] for [facility] and to discuss d/c. No answer left voicemail. This is 3rd attempt by [facility]

## 2020-05-20 NOTE — Progress Notes (Signed)
[facility]  05/20/2020 1:30 PM Jaclyn SladeShekinah Y [name]  MRN:  [id number] Subjective:  Pt is seen and examined today. Pt states her mood isgoodtoday. She rates her mood at 9/10 (10 is the best mood). Ptstates she sleptwell last night.Pt states her appetite is goodand she ate all her meals. Pt denies any anxiety. She states she didn't have paranoia last night. She states she still hears some voice that tell her that She will be raped or her sister is going to be raped. Pt c/o auditory hallucinations. Currently,Pt denies any suicidal ideation, homicidal ideation and, visual hallucination. Pt denies any headache, nausea, vomiting, dizziness, chest pain, SOB, abdominal pain, diarrhea, and constipation. Pt denies any medication side effects and has been tolerating it well. Pt denies any concerns. Objective:Pt is 24 year old female with past history of Bipolar disorder [facility] with GPD, IVCed by her Mom. Family indicatedpatient sentinappropriate/obscene messages to them, and made threats of harm against family member(s). On Examination,She is calm, oriented x 3.Pt's mood iseuthymic and affect iscongruent. Pt's judgement is impaired and she lacks insight. Endorses Auditory hallucinations, Denies visual hallucinations. No SI, HI. Blood pressure 138/88, pulse (!) 110, temperature 98 F (36.7 C), temperature source Oral, resp. rate 20, height 5\' 4"  (1.626 m), weight 77.1 kg, last menstrual period 05/09/2020, SpO2 99 %. Nursing notes indicate Pt slept for 7.5 hours and didn't have any paranoia last night. Pt attended OT group.  Phone conversation with Mom Bonner PunaPam Husk- Mom states she can come home after the discharge. Mom states not to discharge her to sister as she threatened her sister and she had thoughts of harming her unborn baby. She states she sounds much better during day time but at night she gets paranoid. She states she had been diagnosed with Schizophrenia and  Schizoaffective disorder about 2 years ago.  Principal Problem: Bipolar I disorder (HCC) Diagnosis: Principal Problem:   Bipolar I disorder (HCC) Active Problems:   Benzodiazepine dependence (HCC)  Total Time spent with patient: 20 minutes  Past Psychiatric History: see H&P  Past Medical History:  Past Medical History:  Diagnosis Date  . ADHD (attention deficit hyperactivity disorder)   . Allergy   . Anxiety   . Bipolar disorder (HCC)   . Eczema 12/11/13  . Schizo-affective schizophrenia (HCC)    History reviewed. No pertinent surgical history. Family History:  Family History  Problem Relation Age of Onset  . Asthma Mother   . Stroke Father   . Heart disease Father   . Asthma Father   . Hypertension Father        pulmonary hypertension  . Heart disease Maternal Uncle   . Hypertension Maternal Grandmother   . COPD Maternal Grandmother   . Diabetes Maternal Grandfather   . Stroke Paternal Grandmother   . Diabetes Paternal Grandmother   . Diabetes Paternal Grandfather   . Leukemia Maternal Uncle    Family Psychiatric  History: see H&P Social History:  Social History   Substance and Sexual Activity  Alcohol Use Yes   Comment: occasionally     Social History   Substance and Sexual Activity  Drug Use No    Social History   Socioeconomic History  . Marital status: Single    Spouse name: Not on file  . Number of children: Not on file  . Years of education: Not on file  . Highest education level: Not on file  Occupational History  . Occupation: Unemployed  Tobacco  Use  . Smoking status: Never Smoker  . Smokeless tobacco: Never Used  Vaping Use  . Vaping Use: Never used  Substance and Sexual Activity  . Alcohol use: Yes    Comment: occasionally  . Drug use: No  . Sexual activity: Not Currently    Birth control/protection: Pill  Other Topics Concern  . Not on file  Social History Narrative   Lives with mom in an apartment on the second floor.  No  children.  Currently not working.  Education: college.    Social Determinants of Health   Financial Resource Strain:   . Difficulty of Paying Living Expenses: Not on file  Food Insecurity:   . Worried About Programme researcher, broadcasting/film/video in the Last Year: Not on file  . Ran Out of Food in the Last Year: Not on file  Transportation Needs:   . Lack of Transportation (Medical): Not on file  . Lack of Transportation (Non-Medical): Not on file  Physical Activity:   . Days of Exercise per Week: Not on file  . Minutes of Exercise per Session: Not on file  Stress:   . Feeling of Stress : Not on file  Social Connections:   . Frequency of Communication with Friends and Family: Not on file  . Frequency of Social Gatherings with Friends and Family: Not on file  . Attends Religious Services: Not on file  . Active Member of Clubs or Organizations: Not on file  . Attends Banker Meetings: Not on file  . Marital Status: Not on file   Additional Social History:                         Sleep: Good  Appetite:  Good  Current Medications: Current Facility-Administered Medications  Medication Dose Route Frequency Provider Last Rate Last Admin  . acetaminophen (TYLENOL) tablet 650 mg  650 mg Oral Q6H PRN Nwoko, Uchenna E, PA      . alum & mag hydroxide-simeth (MAALOX/MYLANTA) 200-200-20 MG/5ML suspension 30 mL  30 mL Oral Q4H PRN Nwoko, Uchenna E, PA   30 mL at 05/18/20 1441  . divalproex (DEPAKOTE) DR tablet 750 mg  750 mg Oral Q supper Karsten Ro, MD   750 mg at 05/19/20 1613  . hydrOXYzine (ATARAX/VISTARIL) tablet 25 mg  25 mg Oral Q6H PRN Nwoko, Uchenna E, PA   25 mg at 05/16/20 2053  . LORazepam (ATIVAN) tablet 1 mg  1 mg Oral Q6H PRN Antonieta Pert, MD   1 mg at 05/19/20 2052  . OLANZapine zydis (ZYPREXA) disintegrating tablet 10 mg  10 mg Oral Q8H PRN Antonieta Pert, MD   10 mg at 05/15/20 2152   And  . LORazepam (ATIVAN) tablet 1 mg  1 mg Oral PRN Antonieta Pert,  MD       And  . ziprasidone (GEODON) injection 20 mg  20 mg Intramuscular PRN Antonieta Pert, MD      . LORazepam (ATIVAN) tablet 1 mg  1 mg Oral QHS Antonieta Pert, MD   1 mg at 05/19/20 2050  . magnesium hydroxide (MILK OF MAGNESIA) suspension 30 mL  30 mL Oral Daily PRN Nwoko, Uchenna E, PA      . metoprolol tartrate (LOPRESSOR) tablet 12.5 mg  12.5 mg Oral BID Antonieta Pert, MD   12.5 mg at 05/20/20 0819  . OLANZapine zydis (ZYPREXA) disintegrating tablet 20 mg  20 mg Oral QHS Clary,  Marlane Mingle, MD   20 mg at 05/19/20 2050  . paliperidone (INVEGA) 24 hr tablet 9 mg  9 mg Oral QHS Antonieta Pert, MD   9 mg at 05/19/20 2050    Lab Results: No results found for this or any previous visit (from the past 48 hour(s)).  Blood Alcohol level:  Lab Results  Component Value Date   ETH <10 05/11/2020   ETH <10 02/11/2018    Metabolic Disorder Labs: Lab Results  Component Value Date   HGBA1C 5.2 05/12/2020   MPG 102.54 05/12/2020   MPG 93.93 03/11/2018   No results found for: PROLACTIN Lab Results  Component Value Date   CHOL 190 05/12/2020   TRIG 38 05/12/2020   HDL 67 05/12/2020   CHOLHDL 2.8 05/12/2020   VLDL 8 05/12/2020   LDLCALC 115 (H) 05/12/2020   LDLCALC 102 (H) 08/20/2019    Physical Findings: AIMS: Facial and Oral Movements Muscles of Facial Expression: None, normal Lips and Perioral Area: None, normal Jaw: None, normal Tongue: None, normal,Extremity Movements Upper (arms, wrists, hands, fingers): None, normal Lower (legs, knees, ankles, toes): None, normal, Trunk Movements Neck, shoulders, hips: None, normal, Overall Severity Severity of abnormal movements (highest score from questions above): None, normal Incapacitation due to abnormal movements: None, normal Patient's awareness of abnormal movements (rate only patient's report): No Awareness, Dental Status Current problems with teeth and/or dentures?: No Does patient usually wear dentures?: No   CIWA:    COWS:     Musculoskeletal: Strength & Muscle Tone: within normal limits Gait & Station: normal Patient leans: N/A  Psychiatric Specialty Exam: Physical Exam Vitals and nursing note reviewed.  Constitutional:      General: She is not in acute distress.    Appearance: Normal appearance. She is not ill-appearing, toxic-appearing or diaphoretic.  HENT:     Head: Normocephalic and atraumatic.  Pulmonary:     Effort: Pulmonary effort is normal.  Neurological:     General: No focal deficit present.     Mental Status: She is alert and oriented to person, place, and time.     Review of Systems  Constitutional: Negative.   Eyes: Negative.   Respiratory: Negative.   Cardiovascular: Negative.   Gastrointestinal: Negative.   Genitourinary: Negative.   Musculoskeletal: Negative.   Skin: Negative.   Neurological: Negative.   Psychiatric/Behavioral: Positive for hallucinations. Negative for agitation, confusion, dysphoric mood, sleep disturbance and suicidal ideas. The patient is not nervous/anxious.     Blood pressure 138/88, pulse (!) 110, temperature 98 F (36.7 C), temperature source Oral, resp. rate 20, height 5\' 4"  (1.626 m), weight 77.1 kg, last menstrual period 05/09/2020, SpO2 99 %.Body mass index is 29.18 kg/m.  General Appearance: Casual  Eye Contact:  Fair  Speech:  Normal Rate  Volume:  Normal  Mood:  Euthymic  Affect:  Congruent  Thought Process:  Goal Directed and Descriptions of Associations: Loose  Orientation:  Full (Time, Place, and Person)  Thought Content:  Hallucinations: Auditory  Suicidal Thoughts:  No  Homicidal Thoughts:  No  Memory:  Immediate;   Fair Recent;   Fair Remote;   Fair  Judgement:  Impaired  Insight:  Lacking  Psychomotor Activity:  Normal  Concentration:  Concentration: Fair and Attention Span: Fair  Recall:  05/11/2020 of Knowledge:  Fair  Language:  Fair  Akathisia:  Negative  Handed:  Right  AIMS (if indicated):      Assets:  Desire for Improvement Housing  Resilience Social Support  ADL's:  Intact  Cognition:  WNL  Sleep:  Number of Hours: 7.5     Treatment Plan Summary:Pt is 24 year old female with past history of Bipolar disorder presented to [facility] on 05/11/2020 with GPD, IVCed by her Mom. Family indicatedpatient sentinappropriate/obscene messages to them, and made threats of harm against family member(s). On Examination,She is calm, oriented x 3.Pt's mood iseuthymic and affect iscongruent. Pt's judgement is impaired and she lacks insight. Endorses Auditory hallucinations, Denies visual hallucinations. No SI, HI. Blood pressure 138/88, pulse (!) 110, temperature 98 F (36.7 C), temperature source Oral, resp. rate 20, height 5\' 4"  (1.626 m), weight 77.1 kg, last menstrual period 05/09/2020, SpO2 99 %. Nursing notes indicate Pt slept for 7.5 hours and didn't have any paranoia last night. Pt attended OT group. Plan- Daily contact with patient to assess and evaluate symptoms and progress in treatment - Order Depakote Level Continue inpatient hospitalization. -Patient received Invega Sustenna234 mg on 05/15/2020 -Invega sustenna 156 mg was given on 05/19/2020 -Continue Depakote to 750 mg from tonight. -Continue Zyprexa 20 mg Nightly  -ContinueInvega9mg  PO QHS for psychosis/mood instability -Continue Ativan 1 mg PO Q6H PRNfor anxiety/Agitation -Continue Ativan 1 mg PO QHS for anxiety/insomnia -Continue Vistaril 25 mg PO Q6HR PRN anxiety  -Continue Agitation Protocol. -Patient will participate in the therapeutic group milieu. -Disposition in progress.  07/19/2020, MD 05/20/2020, 1:30 PM

## 2020-05-20 NOTE — [facility] Group Notes (Signed)
Patient did not attend orientation group or psych-educational group.

## 2020-05-21 ENCOUNTER — Other Ambulatory Visit (HOSPITAL_COMMUNITY): Payer: Self-pay | Admitting: Psychiatry

## 2020-05-21 LAB — VALPROIC ACID LEVEL: Valproic Acid Lvl: 62 ug/mL (ref 50.0–100.0)

## 2020-05-21 MED ORDER — OLANZAPINE 20 MG PO TBDP
20.0000 mg | ORAL_TABLET | Freq: Every day | ORAL | 0 refills | Status: DC
Start: 2020-05-21 — End: 2022-12-13

## 2020-05-21 MED ORDER — LORAZEPAM 1 MG PO TABS
1.0000 mg | ORAL_TABLET | Freq: Four times a day (QID) | ORAL | 0 refills | Status: DC | PRN
Start: 2020-05-21 — End: 2020-05-21

## 2020-05-21 MED ORDER — PALIPERIDONE ER 9 MG PO TB24: 9.0000 mg | ORAL_TABLET | Freq: Every day | ORAL | 0 refills | Status: DC

## 2020-05-21 MED ORDER — DIVALPROEX SODIUM 250 MG PO DR TAB
750.0000 mg | DELAYED_RELEASE_TABLET | Freq: Every day | ORAL | 0 refills | Status: DC
Start: 2020-05-21 — End: 2020-10-06

## 2020-05-21 MED ORDER — METOPROLOL TARTRATE 25 MG PO TABS
25.0000 mg | ORAL_TABLET | Freq: Two times a day (BID) | ORAL | 0 refills | Status: DC
Start: 2020-05-21 — End: 2020-05-26

## 2020-05-21 MED ORDER — HYDROXYZINE HCL 25 MG PO TABS
25.0000 mg | ORAL_TABLET | Freq: Four times a day (QID) | ORAL | 0 refills | Status: DC | PRN
Start: 2020-05-21 — End: 2020-10-06

## 2020-05-21 MED FILL — PALIPERIDONE ER 9 MG TB24: 9 | 30 days supply | Qty: 30 | Fill #0

## 2020-05-21 MED FILL — METOPROLOL TARTRATE 25 MG T: 25 | 30 days supply | Qty: 60 | Fill #0

## 2020-05-21 MED FILL — DIVALPROEX SOD DR 250 MG TA: 250 | 30 days supply | Qty: 90 | Fill #0

## 2020-05-21 MED FILL — HYDROXYZINE HCL 25 MG TABS: 25 | 18 days supply | Qty: 75 | Fill #0

## 2020-05-21 MED FILL — LORazepam 1 MG TABS: 1 | 1 days supply | Qty: 4 | Fill #0

## 2020-05-21 MED FILL — OLANZapine 20 MG TBDP: 20 | 30 days supply | Qty: 30 | Fill #0

## 2020-05-21 NOTE — BHH Suicide Risk Assessment (Signed)
[facility] Discharge Suicide Risk Assessment   Principal Problem: Bipolar I disorder [facility]) Discharge Diagnoses: Principal Problem:   Bipolar I disorder (HCC) Active Problems:   Benzodiazepine dependence (HCC)   Total Time spent with patient: 15 minutes  Musculoskeletal: Strength & Muscle Tone: within normal limits Gait & Station: normal Patient leans: N/A  Psychiatric Specialty Exam: Review of Systems  All other systems reviewed and are negative.   Blood pressure 116/82, pulse (!) 111, temperature 97.7 F (36.5 C), temperature source Oral, resp. rate 20, height 5\' 4"  (1.626 m), weight 77.1 kg, last menstrual period 05/09/2020, SpO2 99 %.Body mass index is 29.18 kg/m.  General Appearance: Casual  Eye Contact::  Fair  Speech:  Normal Rate409  Volume:  Normal  Mood:  Euthymic  Affect:  Congruent  Thought Process:  Coherent and Descriptions of Associations: Intact  Orientation:  Full (Time, Place, and Person)  Thought Content:  Hallucinations: Auditory  Suicidal Thoughts:  No  Homicidal Thoughts:  No  Memory:  Immediate;   Fair Recent;   Fair Remote;   Fair  Judgement:  Intact  Insight:  Fair  Psychomotor Activity:  Normal  Concentration:  Good  Recall:  Good  Fund of Knowledge:Good  Language: Good  Akathisia:  Negative  Handed:  Right  AIMS (if indicated):     Assets:  Desire for Improvement Housing Resilience Social Support  Sleep:  Number of Hours: 7.5  Cognition: WNL  ADL's:  Intact   Mental Status Per Nursing Assessment::   On Admission:  NA  Demographic Factors:  Low socioeconomic status  Loss Factors: NA  Historical Factors: Impulsivity  Risk Reduction Factors:   Living with another person, especially a relative and Positive social support  Continued Clinical Symptoms:  Bipolar Disorder:   Mixed State  Cognitive Features That Contribute To Risk:  None    Suicide Risk:  Minimal: No identifiable suicidal ideation.  Patients presenting with no  risk factors but with morbid ruminations; may be classified as minimal risk based on the severity of the depressive symptoms   Follow-up Information    [phone], MD Follow up on 05/23/2020.   Specialty: Psychiatry Why: You have a virtual appointment for therapy on 05/23/20 at 2:00 pm.  You also have an appointment for medication management on 06/21/20 at 10:00 am.  These appointments will be held Virtually. Contact information: [address] [place] [phone]               Plan Of Care/Follow-up recommendations:  Activity:  ad lib Tests:  You need to have your doctor check your depakote level, your liver function enzymes and your complete blood  cell count on your next visit. Other:  CBC with differential, LFT's and Valproic acid level  [phone], MD 05/21/2020, 8:17 AM

## 2020-05-21 NOTE — Progress Notes (Signed)
Adult Psychoeducational Group Note  Date:  05/21/2020 Time:  12:53 AM  Group Topic/Focus:  Wrap-Up Group:   The focus of this group is to help patients review their daily goal of treatment and discuss progress on daily workbooks.  Participation Level:  Active  Participation Quality:  Appropriate  Affect:  Appropriate  Cognitive:  Disorganized  Insight: Appropriate  Engagement in Group:  Developing/Improving  Modes of Intervention:  Discussion  Additional Comments:  Pt stated her goal for today was to focus on her treatment plan. Pt stated she felt she accomplished her goal today. Pt stated she talk with her doctor and social worker, regarding her care today. Pt stated been able to contact her family today, improved her overall day. Pt stated she took all her medication today from her providers. Pt rated her overall day a 10. Pt stated her appetite was pretty good today and she attended all meals. Pt stated her sleep last night was good. Pt stated the goal for tonight was to get some rest. Pt stated she was in no physical pain. Pt deny auditory or visual hallucinations. Pt denies thoughts of harming herself or others. Pt stated she would alert staff if anything changes.  Felipa Furnace 05/21/2020, 12:53 AM

## 2020-05-21 NOTE — BHH Group Notes (Signed)
.  Psychoeducational Group Note    Date: 05-21-20 Time: 0900-1000    Goal Setting   Purpose of Group: This group helps to provide patients with the steps of setting a goal that is specific, measurable, attainable, realistic and time specific. A discussion on how we keep ourselves stuck with negative self talk.    Participation Level:  Active  Participation Quality:  Appropriate  Affect:  Appropriate  Cognitive:  Appropriate  Insight:  Improving  Engagement in Group:  Engaged  Additional Comments:  Pt rated her energy level at a 5. Stated her goal today was to control her anger  Dione Housekeeper

## 2020-05-21 NOTE — Progress Notes (Signed)
D: Pt A & O X 3. Denies SI, HI, AVH and pain at this time. Reports improve in auditory hallucinations "the voices are barely there". D/C home as ordered. Picked up in lobby by her mother.  A: D/C instructions reviewed with pt and mother per pt's request. D/C instructions include prescriptions and follow up appointment; compliance encouraged. Pt had no belongings in locker at time of departure. Scheduled  medications given with verbal education and effects monitored. Safety checks maintained without incident till time of d/c.  R: Pt receptive to care. Compliant with medications when offered. Denies adverse drug reactions when assessed. Verbalized understanding related to d/c instructions. Signed belonging sheet in agreement to no items in locker at time of admission & discharge. Ambulatory with a steady gait. Appears to be in no physical distress at time of departure.
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
## 2020-05-21 NOTE — Progress Notes (Signed)
  [facility] Adult Case Management Discharge Plan :  Will you be returning to the same living situation after discharge:  Yes,  HOME. At discharge, do you have transportation home?: Yes,  via Pt's mother. Do you have the ability to pay for your medications: Yes,  Pt is insured.  Release of information consent forms completed and in the chart;  Patient's signature needed at discharge.  Patient to Follow up at:  Follow-up Information    Milagros Evener, MD Follow up on 05/23/2020.   Specialty: Psychiatry Why: You have a virtual appointment for therapy on 05/23/20 at 2:00 pm.  You also have an appointment for medication management on 06/21/20 at 10:00 am.  These appointments will be held Virtually. Contact information: [address] [place] [address] [phone]               Next level of care provider has access to [facility] Link:no  Safety Planning and Suicide Prevention discussed: Yes,  completed with Pt and Pt's mother.   Have you used any form of tobacco in the last 30 days? (Cigarettes, Smokeless Tobacco, Cigars, and/or Pipes): No  Has patient been referred to the Quitline?: N/A patient is not a smoker  Patient has been referred for addiction treatment: N/A  Jacinta Shoe, LCSW 05/21/2020, 11:01 AM

## 2020-05-23 ENCOUNTER — Other Ambulatory Visit (HOSPITAL_BASED_OUTPATIENT_CLINIC_OR_DEPARTMENT_OTHER): Payer: Self-pay | Admitting: Specialist

## 2020-05-23 DIAGNOSIS — F4323 Adjustment disorder with mixed anxiety and depressed mood: Secondary | ICD-10-CM | POA: Diagnosis not present

## 2020-05-24 MED FILL — DIVALPROEX SOD ER 250 MG TA: 250 | 30 days supply | Qty: 90 | Fill #0

## 2020-05-26 ENCOUNTER — Other Ambulatory Visit: Payer: Self-pay

## 2020-05-26 ENCOUNTER — Encounter: Payer: Self-pay | Admitting: Family Medicine

## 2020-05-26 ENCOUNTER — Other Ambulatory Visit: Payer: Self-pay | Admitting: Family Medicine

## 2020-05-26 ENCOUNTER — Telehealth (INDEPENDENT_AMBULATORY_CARE_PROVIDER_SITE_OTHER): Payer: Medicare Other | Admitting: Family Medicine

## 2020-05-26 VITALS — BP 117/79 | HR 109 | Temp 97.1°F | Resp 20 | Ht 64.0 in | Wt 209.4 lb

## 2020-05-26 DIAGNOSIS — E559 Vitamin D deficiency, unspecified: Secondary | ICD-10-CM

## 2020-05-26 DIAGNOSIS — I471 Supraventricular tachycardia: Secondary | ICD-10-CM

## 2020-05-26 DIAGNOSIS — J302 Other seasonal allergic rhinitis: Secondary | ICD-10-CM

## 2020-05-26 DIAGNOSIS — Z7251 High risk heterosexual behavior: Secondary | ICD-10-CM | POA: Diagnosis not present

## 2020-05-26 MED ORDER — FLUTICASONE PROPIONATE 50 MCG/ACT NA SUSP: NASAL | 2 refills | Status: DC

## 2020-05-26 MED ORDER — METOPROLOL TARTRATE 25 MG PO TABS: ORAL_TABLET | ORAL | 0 refills | Status: DC

## 2020-05-26 MED ORDER — CETIRIZINE HCL 10 MG PO TABS: 10.0000 mg | ORAL_TABLET | Freq: Every day | ORAL | 11 refills | Status: DC
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
## 2020-05-27 ENCOUNTER — Telehealth: Payer: Self-pay | Admitting: Family Medicine

## 2020-05-27 DIAGNOSIS — I471 Supraventricular tachycardia: Secondary | ICD-10-CM

## 2020-05-27 NOTE — Telephone Encounter (Signed)
Okay to place referral

## 2020-05-27 NOTE — Telephone Encounter (Signed)
Referral placed.

## 2020-05-27 NOTE — Telephone Encounter (Signed)
Ok to place referral for Dr Servando Salina upstairs for tachycardia

## 2020-05-27 NOTE — Telephone Encounter (Signed)
Caller name: [name] Call back [phone]  Mom states patient heart rate was 129 this morning. Mom would like for you to go ahead and place cardiology referral.

## 2020-05-30 ENCOUNTER — Other Ambulatory Visit (HOSPITAL_COMMUNITY): Payer: Self-pay | Admitting: Specialist

## 2020-05-30 MED FILL — PROPRANOLOL 20 MG TABLET: 20 | 30 days supply | Qty: 90 | Fill #0

## 2020-05-31 ENCOUNTER — Other Ambulatory Visit (HOSPITAL_BASED_OUTPATIENT_CLINIC_OR_DEPARTMENT_OTHER): Payer: Self-pay | Admitting: Specialist

## 2020-05-31 MED FILL — INVEGA SUSTENNA 234 MG PREF: 234 | 30 days supply | Qty: 2 | Fill #0

## 2020-05-31 MED FILL — LORazepam 1 MG TABS: 1 | 90 days supply | Qty: 360 | Fill #0

## 2020-05-31 MED FILL — VIT D3-50 50,000 UNITS CAPS: 1.25 MG | 84 days supply | Qty: 24 | Fill #1

## 2020-06-02 DIAGNOSIS — F4322 Adjustment disorder with anxiety: Secondary | ICD-10-CM | POA: Diagnosis not present

## 2020-06-07 ENCOUNTER — Other Ambulatory Visit: Payer: Self-pay | Admitting: Family Medicine

## 2020-06-08 ENCOUNTER — Other Ambulatory Visit (HOSPITAL_BASED_OUTPATIENT_CLINIC_OR_DEPARTMENT_OTHER): Payer: Self-pay | Admitting: Internal Medicine

## 2020-06-08 ENCOUNTER — Ambulatory Visit (INDEPENDENT_AMBULATORY_CARE_PROVIDER_SITE_OTHER): Payer: 59

## 2020-06-08 ENCOUNTER — Ambulatory Visit (INDEPENDENT_AMBULATORY_CARE_PROVIDER_SITE_OTHER): Payer: 59 | Admitting: Cardiology

## 2020-06-08 ENCOUNTER — Other Ambulatory Visit (INDEPENDENT_AMBULATORY_CARE_PROVIDER_SITE_OTHER): Payer: Medicare Other

## 2020-06-08 ENCOUNTER — Encounter: Payer: Self-pay | Admitting: Cardiology

## 2020-06-08 ENCOUNTER — Other Ambulatory Visit: Payer: Self-pay

## 2020-06-08 VITALS — BP 114/86 | HR 77 | Ht 64.0 in | Wt 206.0 lb

## 2020-06-08 DIAGNOSIS — R002 Palpitations: Secondary | ICD-10-CM

## 2020-06-08 DIAGNOSIS — R42 Dizziness and giddiness: Secondary | ICD-10-CM

## 2020-06-08 DIAGNOSIS — R55 Syncope and collapse: Secondary | ICD-10-CM | POA: Diagnosis not present

## 2020-06-08 DIAGNOSIS — E559 Vitamin D deficiency, unspecified: Secondary | ICD-10-CM | POA: Diagnosis not present

## 2020-06-08 DIAGNOSIS — Z7251 High risk heterosexual behavior: Secondary | ICD-10-CM

## 2020-06-08 LAB — VITAMIN D 25 HYDROXY (VIT D DEFICIENCY, FRACTURES): Vit D, 25-Hydroxy: 45 ng/mL (ref 30–100)

## 2020-06-08 MED FILL — FLUARIX QUADRIVALENT 0.5 ML: 0.5 | 1 days supply | Qty: 1 | Fill #0

## 2020-06-08 NOTE — Patient Instructions (Signed)
Medication Instructions:  Your physician recommends that you continue on your current medications as directed. Please refer to the Current Medication list given to you today.  *If you need a refill on your cardiac medications before your next appointment, please call your pharmacy*   Lab Work: None If you have labs (blood work) drawn today and your tests are completely normal, you will receive your results only by: Marland Kitchen MyChart Message (if you have MyChart) OR . A paper copy in the mail If you have any lab test that is abnormal or we need to change your treatment, we will call you to review the results.   Testing/Procedures: Your physician has requested that you have an echocardiogram. Echocardiography is a painless test that uses sound waves to create images of your heart. It provides your doctor with information about the size and shape of your heart and how well your heart's chambers and valves are working. This procedure takes approximately one hour. There are no restrictions for this procedure.  A zio monitor was ordered today. It will remain on for 14 days. You will then return monitor and event diary in provided box. It takes 1-2 weeks for report to be downloaded and returned to [name]. We will call you with the results. If monitor falls off or has orange flashing light, please call Zio for further instructions.      Follow-Up: At [facility], you and your health needs are our priority.  As part of our continuing mission to provide you with exceptional heart care, we have created designated Provider Care Teams.  These Care Teams include your primary Cardiologist (physician) and Advanced Practice Providers (APPs -  Physician Assistants and Nurse Practitioners) who all work together to provide you with the care you need, when you need it.  We recommend signing up for the patient portal called "MyChart".  Sign up information is provided on this After Visit Summary.  MyChart is used to  connect with patients for Virtual Visits (Telemedicine).  Patients are able to view lab/test results, encounter notes, upcoming appointments, etc.  Non-urgent messages can be sent to your provider as well.   To learn more about what you can do with MyChart, go to [email or web address].    Your next appointment:   3 month(s)  The format for your next appointment:   In Person  Provider:   Thomasene Ripple, DO   Other Instructions NO GYM FOR [facility] FOR NOW. PLEASE DRINK AS MUCH WATER AS POSSIBLE.

## 2020-06-08 NOTE — Progress Notes (Signed)
Cardiology Office Note:    Date:  06/08/2020   ID:  [name], DOB [birth year], MRN [id number]  PCP:  Zola Button, Grayling Congress, DO  Cardiologist:  Thomasene Ripple, DO  Electrophysiologist:  None   Referring MD: Zola Button, Grayling Congress, *   " I am doing well"  History of Present Illness:    [name] is a 24 y.o. female with a hx of anxiety, bipolar disorder presents today with her mother to be evaluated for palpitations.  The patient does give some history per her mother is here with her caretaker as well tells me that the patient has had issues with significant elevated heart rate.  She noticed that the patient's heart rate was gradually increasing until recently she started to see it mostly in the 140s.  At which time she was started on metoprolol 25 mg daily which did drop her blood pressure a little bit and therefore the patient was decreased to 12.5 mg daily of metoprolol.  That did not help the patient and her heart rate continues to be elevated.  Therefore this was changed to propanolol she is currently on propanolol 20 mg which she stated for every 8 hours but she currently takes it twice a day due to fear of decrease in systolic blood pressure. The patient denies any chest pain, or shortness of breath.  And she tells me she has been avoiding some stimulants including coffee and tea but does still drink soda.   Past Medical History:  Diagnosis Date  . ADHD (attention deficit hyperactivity disorder)   . Allergy   . Anxiety   . Bipolar disorder (HCC)   . Eczema 12/11/13  . Schizo-affective schizophrenia (HCC)     History reviewed. No pertinent surgical history.  Current Medications: Current Meds  Medication Sig  . cetirizine (ZYRTEC) 10 MG tablet Take 1 tablet (10 mg total) by mouth daily.  . divalproex (DEPAKOTE) 250 MG DR tablet Take 3 tablets (750 mg total) by mouth daily with supper.  . fluticasone (FLONASE) 50 MCG/ACT nasal spray PLACE 2 SPRAYS INTO EACH NOSTRIL  EVERY DAY  . hydrOXYzine (ATARAX/VISTARIL) 25 MG tablet Take 1 tablet (25 mg total) by mouth every 6 (six) hours as needed for anxiety.  Marland Kitchen LORazepam (ATIVAN) 1 MG tablet Take 1 tablet (1 mg total) by mouth every 6 (six) hours as needed for anxiety (agitation).  . Paliperidone ER (INVEGA SUSTENNA) injection Inject 117 mg into the muscle every 30 (thirty) days.  . propranolol (INDERAL) 20 MG tablet Take 20 mg by mouth 3 (three) times daily.     Allergies:   Concerta [methylphenidate], Terbinafine, Trazodone and nefazodone, Clindamycin hcl, Haldol [haloperidol], and Terbinafine hcl   Social History   Socioeconomic History  . Marital status: Single    Spouse name: Not on file  . Number of children: Not on file  . Years of education: Not on file  . Highest education level: Not on file  Occupational History  . Occupation: Unemployed  Tobacco Use  . Smoking status: Never Smoker  . Smokeless tobacco: Never Used  Vaping Use  . Vaping Use: Never used  Substance and Sexual Activity  . Alcohol use: Yes    Comment: occasionally  . Drug use: No  . Sexual activity: Not Currently    Birth control/protection: Pill  Other Topics Concern  . Not on file  Social History Narrative   Lives with mom in an apartment on the second floor.  No children.  Currently not working.  Education: college.    Social Determinants of Health   Financial Resource Strain:   . Difficulty of Paying Living Expenses: Not on file  Food Insecurity:   . Worried About Programme researcher, broadcasting/film/videounning Out of Food in the Last Year: Not on file  . Ran Out of Food in the Last Year: Not on file  Transportation Needs:   . Lack of Transportation (Medical): Not on file  . Lack of Transportation (Non-Medical): Not on file  Physical Activity:   . Days of Exercise per Week: Not on file  . Minutes of Exercise per Session: Not on file  Stress:   . Feeling of Stress : Not on file  Social Connections:   . Frequency of Communication with Friends and  Family: Not on file  . Frequency of Social Gatherings with Friends and Family: Not on file  . Attends Religious Services: Not on file  . Active Member of Clubs or Organizations: Not on file  . Attends BankerClub or Organization Meetings: Not on file  . Marital Status: Not on file     Family History: The patient's family history includes Asthma in her father and mother; COPD in her maternal grandmother; Diabetes in her maternal grandfather, paternal grandfather, and paternal grandmother; Heart disease in her father and maternal uncle; Hypertension in her father and maternal grandmother; Leukemia in her maternal uncle; Stroke in her father and paternal grandmother.  ROS:   Review of Systems  Constitution: Negative for decreased appetite, fever and weight gain.  HENT: Negative for congestion, ear discharge, hoarse voice and sore throat.   Eyes: Negative for discharge, redness, vision loss in right eye and visual halos.  Cardiovascular: Report palpitations.  Negative for chest pain, dyspnea on exertion, leg swelling, orthopnea Respiratory: Negative for cough, hemoptysis, shortness of breath and snoring.   Endocrine: Negative for heat intolerance and polyphagia.  Hematologic/Lymphatic: Negative for bleeding problem. Does not bruise/bleed easily.  Skin: Negative for flushing, nail changes, rash and suspicious lesions.  Musculoskeletal: Negative for arthritis, joint pain, muscle cramps, myalgias, neck pain and stiffness.  Gastrointestinal: Negative for abdominal pain, bowel incontinence, diarrhea and excessive appetite.  Genitourinary: Negative for decreased libido, genital sores and incomplete emptying.  Neurological: Negative for brief paralysis, focal weakness, headaches and loss of balance.  Psychiatric/Behavioral: Negative for altered mental status, depression and suicidal ideas.  Allergic/Immunologic: Negative for HIV exposure and persistent infections.    EKGs/Labs/Other Studies Reviewed:     The following studies were reviewed today:   EKG:  The ekg ordered today demonstrates sinus rhythm, heart rate 77 bpm no ST segment changes.  Recent Labs: 05/11/2020: ALT 17; BUN 15; Creatinine, Ser 0.79; Hemoglobin 12.3; Platelets 260; Potassium 3.6; Sodium 136 05/12/2020: TSH 1.131  Recent Lipid Panel    Component Value Date/Time   CHOL 190 05/12/2020 1809   TRIG 38 05/12/2020 1809   HDL 67 05/12/2020 1809   CHOLHDL 2.8 05/12/2020 1809   VLDL 8 05/12/2020 1809   LDLCALC 115 (H) 05/12/2020 1809    Physical Exam:    VS:  BP 114/86 (BP Location: Right Arm)   Pulse 77   Ht 5\' 4"  (1.626 m)   Wt 206 lb (93.4 kg)   LMP 05/09/2020   SpO2 97%   BMI 35.36 kg/m     Wt Readings from Last 3 Encounters:  06/08/20 206 lb (93.4 kg)  05/26/20 209 lb 6.4 oz (95 kg)  05/11/20 209 lb 7 oz (95 kg)  GEN: Well nourished, well developed in no acute distress HEENT: Normal NECK: No JVD; No carotid bruits LYMPHATICS: No lymphadenopathy CARDIAC: S1S2 noted,RRR, no murmurs, rubs, gallops RESPIRATORY:  Clear to auscultation without rales, wheezing or rhonchi  ABDOMEN: Soft, non-tender, non-distended, +bowel sounds, no guarding. EXTREMITIES: No edema, No cyanosis, no clubbing MUSCULOSKELETAL:  No deformity  SKIN: Warm and dry NEUROLOGIC:  Alert and oriented x 3, non-focal PSYCHIATRIC:  Normal affect, good insight  ASSESSMENT:    1. Palpitations   2. Dizziness   3. Syncope, unspecified syncope type    PLAN:     1.  I would like to rule out a cardiovascular etiology of this palpitation, therefore at this time I would like to placed a zio patch for  14  days. In additon a transthoracic echocardiogram will be ordered to assess LV/RV function and any structural abnormalities. Once these testing have been performed amd reviewed further reccomendations will be made. For now, I do reccomend that the patient goes to the nearest ED if  symptoms recur.  She also inquired about going to the  gym.  I did advise the patient to start with walking for now and we can discuss further.  I spoke with the patient and her mother giving her underlining psychiatric conditions which also includes anxiety this may be helping with her undermining increasing heart rate especially if her monitor which she will be wearing show all sinus rhythm.  We talked about the propanolol dosing for now I did tell the that twice a day was fine however she can take her blood pressure midday and if this is greater than 110 mmHg systolic is okay for her to take her midday dose of propanolol.  The patient is in agreement with the above plan. The patient left the office in stable condition.  The patient will follow up in all of her questions were answered during this visit.   Medication Adjustments/Labs and Tests Ordered: Current medicines are reviewed at length with the patient today.  Concerns regarding medicines are outlined above.  Orders Placed This Encounter  Procedures  . LONG TERM MONITOR (3-14 DAYS)  . EKG 12-Lead  . ECHOCARDIOGRAM COMPLETE   No orders of the defined types were placed in this encounter.   Patient Instructions  Medication Instructions:  Your physician recommends that you continue on your current medications as directed. Please refer to the Current Medication list given to you today.  *If you need a refill on your cardiac medications before your next appointment, please call your pharmacy*   Lab Work: None If you have labs (blood work) drawn today and your tests are completely normal, you will receive your results only by: Marland Kitchen MyChart Message (if you have MyChart) OR . A paper copy in the mail If you have any lab test that is abnormal or we need to change your treatment, we will call you to review the results.   Testing/Procedures: Your physician has requested that you have an echocardiogram. Echocardiography is a painless test that uses sound waves to create images of your heart. It  provides your doctor with information about the size and shape of your heart and how well your heart's chambers and valves are working. This procedure takes approximately one hour. There are no restrictions for this procedure.  A zio monitor was ordered today. It will remain on for 14 days. You will then return monitor and event diary in provided box. It takes 1-2 weeks for report to be downloaded  and returned to [name]. We will call you with the results. If monitor falls off or has orange flashing light, please call Zio for further instructions.      Follow-Up: At [facility], you and your health needs are our priority.  As part of our continuing mission to provide you with exceptional heart care, we have created designated Provider Care Teams.  These Care Teams include your primary Cardiologist (physician) and Advanced Practice Providers (APPs -  Physician Assistants and Nurse Practitioners) who all work together to provide you with the care you need, when you need it.  We recommend signing up for the patient portal called "MyChart".  Sign up information is provided on this After Visit Summary.  MyChart is used to connect with patients for Virtual Visits (Telemedicine).  Patients are able to view lab/test results, encounter notes, upcoming appointments, etc.  Non-urgent messages can be sent to your provider as well.   To learn more about what you can do with MyChart, go to [email or web address].    Your next appointment:   3 month(s)  The format for your next appointment:   In Person  Provider:   Thomasene Ripple, DO   Other Instructions NO GYM FOR [facility] [place] FOR NOW. PLEASE DRINK AS MUCH WATER AS POSSIBLE.     Adopting a Healthy Lifestyle.  Know what a healthy weight is for you (roughly BMI <25) and aim to maintain this   Aim for 7+ servings of fruits and vegetables daily   65-80+ fluid ounces of water or unsweet tea for healthy kidneys   Limit to max 1 drink of alcohol per  day; avoid smoking/tobacco   Limit animal fats in diet for cholesterol and heart health - choose grass fed whenever available   Avoid highly processed foods, and foods high in saturated/trans fats   Aim for low stress - take time to unwind and care for your mental health   Aim for 150 min of moderate intensity exercise weekly for heart health, and weights twice weekly for bone health   Aim for 7-9 hours of sleep daily   When it comes to diets, agreement about the perfect plan isnt easy to find, even among the experts. Experts at the [facility] [employer] developed an idea known as the Healthy Eating Plate. Just imagine a plate divided into logical, healthy portions.   The emphasis is on diet quality:   Load up on vegetables and fruits - one-half of your plate: Aim for color and variety, and remember that potatoes dont count.   Go for whole grains - one-quarter of your plate: Whole wheat, barley, wheat berries, quinoa, oats, brown rice, and foods made with them. If you want pasta, go with whole wheat pasta.   Protein power - one-quarter of your plate: Fish, chicken, beans, and nuts are all healthy, versatile protein sources. Limit red meat.   The diet, however, does go beyond the plate, offering a few other suggestions.   Use healthy plant oils, such as olive, canola, soy, corn, sunflower and peanut. Check the labels, and avoid partially hydrogenated oil, which have unhealthy trans fats.   If youre thirsty, drink water. Coffee and tea are good in moderation, but skip sugary drinks and limit milk and dairy products to one or two daily servings.   The type of carbohydrate in the diet is more important than the amount. Some sources of carbohydrates, such as vegetables, fruits, whole grains, and beans-are healthier than others.  Finally, stay active  Signed, Thomasene Ripple, DO  06/08/2020 8:43 PM    [facility]

## 2020-06-14 MED FILL — INVEGA SUSTENNA 234 MG PREF: 234 | 30 days supply | Qty: 2 | Fill #0

## 2020-06-16 DIAGNOSIS — F209 Schizophrenia, unspecified: Secondary | ICD-10-CM | POA: Diagnosis not present

## 2020-06-21 DIAGNOSIS — F4322 Adjustment disorder with anxiety: Secondary | ICD-10-CM | POA: Diagnosis not present

## 2020-06-29 ENCOUNTER — Ambulatory Visit (HOSPITAL_BASED_OUTPATIENT_CLINIC_OR_DEPARTMENT_OTHER)
Admission: RE | Admit: 2020-06-29 | Discharge: 2020-06-29 | Disposition: A | Discharge status: Home / Self Care | Payer: 59 | Source: Ambulatory Visit | Attending: Cardiology | Admitting: Cardiology

## 2020-06-29 ENCOUNTER — Other Ambulatory Visit: Payer: Self-pay

## 2020-06-29 DIAGNOSIS — R55 Syncope and collapse: Secondary | ICD-10-CM | POA: Diagnosis not present

## 2020-06-29 DIAGNOSIS — R002 Palpitations: Secondary | ICD-10-CM | POA: Diagnosis not present

## 2020-06-29 LAB — ECHOCARDIOGRAM COMPLETE
Area-P 1/2: 4.63 cm2
S' Lateral: 3.18 cm

## 2020-06-30 ENCOUNTER — Telehealth: Payer: Self-pay

## 2020-06-30 MED FILL — TRIAM0.1CR/CETAPCR 3:1 454G: 0.1 | 20 days supply | Qty: 454 | Fill #0

## 2020-06-30 NOTE — Telephone Encounter (Signed)
-----   Message from Kardie Tobb, DO sent at 06/29/2020  5:53 PM EST ----- Normal echo 

## 2020-06-30 NOTE — Telephone Encounter (Signed)
Left message on patients voicemail to please return our call.   

## 2020-07-01 ENCOUNTER — Telehealth: Payer: Self-pay

## 2020-07-01 NOTE — Telephone Encounter (Signed)
Left message on patients voicemail to please return our call.   

## 2020-07-01 NOTE — Telephone Encounter (Signed)
-----   Message from Kardie Tobb, DO sent at 06/29/2020  5:53 PM EST ----- Normal echo 

## 2020-07-02 ENCOUNTER — Other Ambulatory Visit: Payer: Self-pay

## 2020-07-02 ENCOUNTER — Ambulatory Visit (HOSPITAL_COMMUNITY)
Admission: EM | Admit: 2020-07-02 | Discharge: 2020-07-03 | Disposition: A | Discharge status: Home / Self Care | Payer: Medicare Other | Attending: Family | Admitting: Family

## 2020-07-02 ENCOUNTER — Encounter (HOSPITAL_COMMUNITY): Payer: Self-pay

## 2020-07-02 DIAGNOSIS — Z20822 Contact with and (suspected) exposure to covid-19: Secondary | ICD-10-CM | POA: Insufficient documentation

## 2020-07-02 DIAGNOSIS — F312 Bipolar disorder, current episode manic severe with psychotic features: Secondary | ICD-10-CM | POA: Diagnosis not present

## 2020-07-02 DIAGNOSIS — F319 Bipolar disorder, unspecified: Secondary | ICD-10-CM | POA: Diagnosis present

## 2020-07-02 DIAGNOSIS — F310 Bipolar disorder, current episode hypomanic: Secondary | ICD-10-CM | POA: Diagnosis not present

## 2020-07-02 DIAGNOSIS — R002 Palpitations: Secondary | ICD-10-CM | POA: Diagnosis not present

## 2020-07-02 HISTORY — DX: Depression, unspecified: F32.A

## 2020-07-02 LAB — COMPREHENSIVE METABOLIC PANEL
ALT: 15 U/L (ref 0–44)
AST: 17 U/L (ref 15–41)
Albumin: 3.7 g/dL (ref 3.5–5.0)
Alkaline Phosphatase: 64 U/L (ref 38–126)
Anion gap: 12 (ref 5–15)
BUN: 10 mg/dL (ref 6–20)
CO2: 22 mmol/L (ref 22–32)
Calcium: 9.4 mg/dL (ref 8.9–10.3)
Chloride: 104 mmol/L (ref 98–111)
Creatinine, Ser: 0.65 mg/dL (ref 0.44–1.00)
GFR, Estimated: >60 mL/min (ref >60)
Glucose, Bld: 96 mg/dL (ref 70–99)
Potassium: 4 mmol/L (ref 3.5–5.1)
Sodium: 138 mmol/L (ref 135–145)
Total Bilirubin: 0.9 mg/dL (ref 0.3–1.2)
Total Protein: 7.2 g/dL (ref 6.5–8.1)

## 2020-07-02 LAB — CBC WITH DIFFERENTIAL/PLATELET
Abs Immature Granulocytes: 0.02 10*3/uL (ref 0.00–0.07)
Basophils Absolute: 0.1 10*3/uL (ref 0.0–0.1)
Basophils Relative: 1 %
Eosinophils Absolute: 0.2 10*3/uL (ref 0.0–0.5)
Eosinophils Relative: 3 %
HCT: 40.8 % (ref 36.0–46.0)
Hemoglobin: 13.4 g/dL (ref 12.0–15.0)
Immature Granulocytes: 0 %
Lymphocytes Relative: 40 %
Lymphs Abs: 3.1 10*3/uL (ref 0.7–4.0)
MCH: 29.8 pg (ref 26.0–34.0)
MCHC: 32.8 g/dL (ref 30.0–36.0)
MCV: 90.7 fL (ref 80.0–100.0)
Monocytes Absolute: 0.9 10*3/uL (ref 0.1–1.0)
Monocytes Relative: 12 %
Neutro Abs: 3.4 10*3/uL (ref 1.7–7.7)
Neutrophils Relative %: 44 %
Platelets: 307 10*3/uL (ref 150–400)
RBC: 4.5 MIL/uL (ref 3.87–5.11)
RDW: 14.7 % (ref 11.5–15.5)
WBC: 7.7 10*3/uL (ref 4.0–10.5)
nRBC: 0 % (ref 0.0–0.2)

## 2020-07-02 LAB — TSH: TSH: 1.913 u[IU]/mL (ref 0.350–4.500)

## 2020-07-02 LAB — HEMOGLOBIN A1C
Hgb A1c MFr Bld: 5 % (ref 4.8–5.6)
Mean Plasma Glucose: 96.8 mg/dL

## 2020-07-02 LAB — POCT URINE DRUG SCREEN - MANUAL ENTRY (I-SCREEN)
POC Amphetamine UR: NOT DETECTED
POC Buprenorphine (BUP): NOT DETECTED
POC Cocaine UR: NOT DETECTED
POC Marijuana UR: NOT DETECTED
POC Methadone UR: NOT DETECTED
POC Methamphetamine UR: NOT DETECTED
POC Morphine: NOT DETECTED
POC Oxazepam (BZO): POSITIVE — AB
POC Oxycodone UR: NOT DETECTED
POC Secobarbital (BAR): NOT DETECTED

## 2020-07-02 LAB — VALPROIC ACID LEVEL: Valproic Acid Lvl: 21 ug/mL — ABNORMAL LOW (ref 50.0–100.0)

## 2020-07-02 LAB — POC SARS CORONAVIRUS 2 AG -  ED: SARS Coronavirus 2 Ag: NEGATIVE

## 2020-07-02 LAB — ETHANOL: Alcohol, Ethyl (B): <10 mg/dL (ref <10)

## 2020-07-02 LAB — LIPID PANEL
Cholesterol: 186 mg/dL (ref 0–200)
HDL: 56 mg/dL (ref >40)
LDL Cholesterol: 118 mg/dL — ABNORMAL HIGH (ref 0–99)
Total CHOL/HDL Ratio: 3.3 RATIO
Triglycerides: 60 mg/dL (ref <150)
VLDL: 12 mg/dL (ref 0–40)

## 2020-07-02 LAB — RESPIRATORY PANEL BY RT PCR (FLU A&B, COVID)
Influenza A by PCR: NEGATIVE
Influenza B by PCR: NEGATIVE
SARS Coronavirus 2 by RT PCR: NEGATIVE

## 2020-07-02 LAB — MAGNESIUM: Magnesium: 2.3 mg/dL (ref 1.7–2.4)

## 2020-07-02 LAB — POCT URINE PREGNANCY: Preg Test, Ur: NEGATIVE

## 2020-07-02 MED ORDER — ALUM & MAG HYDROXIDE-SIMETH 200-200-20 MG/5ML PO SUSP: 30.0000 mL | ORAL | Status: DC | PRN

## 2020-07-02 MED ORDER — HYDROXYZINE HCL 25 MG PO TABS: 25.0000 mg | ORAL_TABLET | Freq: Three times a day (TID) | ORAL | Status: DC | PRN

## 2020-07-02 MED ORDER — LORAZEPAM 1 MG PO TABS
1.0000 mg | ORAL_TABLET | Freq: Every day | ORAL | Status: DC
  Administered 2020-07-02: 1 mg via ORAL
  Filled 2020-07-02: qty 1

## 2020-07-02 MED ORDER — MAGNESIUM HYDROXIDE 400 MG/5ML PO SUSP: 30.0000 mL | Freq: Every day | ORAL | Status: DC | PRN

## 2020-07-02 MED ORDER — FLUTICASONE PROPIONATE 50 MCG/ACT NA SUSP
1.0000 | Freq: Every day | NASAL | Status: DC
  Administered 2020-07-03: 1 via NASAL
  Filled 2020-07-02: qty 16

## 2020-07-02 MED ORDER — PROPRANOLOL HCL 10 MG PO TABS
20.0000 mg | ORAL_TABLET | Freq: Three times a day (TID) | ORAL | Status: DC
  Administered 2020-07-02 – 2020-07-03 (×2): 20 mg via ORAL
  Filled 2020-07-02 (×2): qty 2

## 2020-07-02 MED ORDER — DIVALPROEX SODIUM 250 MG PO DR TAB
250.0000 mg | DELAYED_RELEASE_TABLET | Freq: Every day | ORAL | Status: DC
  Administered 2020-07-02 – 2020-07-03 (×2): 250 mg via ORAL
  Filled 2020-07-02 (×2): qty 1

## 2020-07-02 MED ORDER — ACETAMINOPHEN 325 MG PO TABS: 650.0000 mg | ORAL_TABLET | Freq: Four times a day (QID) | ORAL | Status: DC | PRN

## 2020-07-02 MED ORDER — LORATADINE 10 MG PO TABS
10.0000 mg | ORAL_TABLET | Freq: Every day | ORAL | Status: DC
  Administered 2020-07-03: 10 mg via ORAL
  Filled 2020-07-02: qty 1

## 2020-07-02 NOTE — ED Notes (Signed)
Pt A&O x 4, no distress noted, calm & cooperative, denies SI, monitoring for safety.  Hearing voices.

## 2020-07-02 NOTE — ED Notes (Signed)
Patient belongings in locker 29, Patient arrived on unit given meal. Patient will redo EKG was not stable enough to read do to inability to control shaking/tremors

## 2020-07-02 NOTE — ED Provider Notes (Signed)
Behavioral Health Admission [facility]&P [facility] & OBS)  Date: 07/02/20 Patient Name: [name] MRN: [id number] Chief Complaint:  Chief Complaint  Patient presents with  . Paranoid    schizophrenia, sending obscene texts to mom, delusional  . Schizophrenia      Diagnoses:  Final diagnoses:  None    HPI: Patient presents voluntarily to [facility] for walk-in assessment.  Patient accompanied by her mother, Courtney Bellizzi.  Patient request that her mother remain in room during assessment.  Patient reports "I want to get some help, I want to get regulated, I want to get the mind reading gone."  Patient reports she telephoned police earlier today, while mother was out shopping, as she had thoughts of killing her mother with a knife, states "I was thinking about executing it."  Patient reports her mother has secured all sharps at home.  Patient reports "I am glad she removed the knives, or my mom would die today."  Patient endorses homicidal ideations with plan and intent.  Patient denies access to means as knives are secured in home.  Patient endorses homicidal ideations toward mother x1 month.  Patient unable to contract for safety of others if she leaves facility.  Patient reports recent stressors include her sister recently having a new baby 3 weeks ago states "I am not getting the attention that I want for my sister."  Patient reports she believes the sister"s baby is "demonic."   Patient presents with tangential conversation.  Patient discusses recent fasting.  Patient presents with rapid and pressured speech at times.  Patient becomes irritable with mother when discussing recent medication changes.  Patient resides in [place] with her mother.  Patient denies access to weapons.  Patient is currently not employed.  Patient denies alcohol and substance use.  Patient endorses average appetite and decreased sleep.  Patient assessed by nurse practitioner.  Patient  alert and oriented.  Patient disheveled noted to be wearing pajamas and bathrobe.  Patient denies suicidal ideations.  Patient denies self-harm behaviors.  There is no evidence that patient is responding to internal stimuli.  Patient offered support and encouragement.  Patient's mother, Harman Ferrin, reports concerns that patient would harm her if given the opportunity.  Patient's mother reports patient makes statements that she does not remember making afterward.  Patient's mother reports patient is followed outpatient by Dr. [name] who has modified medications recently.  Per mother patient last received injectable Invega Sustenna 117 mg IM on November 4.  Her mother aside from [place] injectable patient receives lorazepam 1 mg nightly, propranolol 20 mg 3 times daily and Depakote 250 mg daily.  Patient's mother reports both Ativan and Depakote doses were decreased by outpatient psychiatrist after recent inpatient hospital stay related to sedation and lethargy.  PHQ 2-9:     [facility] from 05/11/2020 in [facility] EM[facility]PARTMENT Admission (Discharged) from OP [facility]  C-SSRS RISK CATEGORY No Risk No Risk High Risk       Total Time spent with patient: 30 minutes  Musculoskeletal  Strength & Muscle Tone: within normal limits Gait & Station: normal Patient leans: N/A  Psychiatric Specialty Exam  Presentation General Appearance: Disheveled  Eye Contact:Good  Speech:Clear and Coherent;Pressured  Speech Volume:Increased  Handedness:Right   Mood and Affect  Mood:Anxious  Affect:Labile   Thought Process  Thought Processes:Coherent  Descriptions of Associations:Tangential  Orientation:Full (Time, Place and Person)  Thought Content:Tangential  Hallucinations:Hallucinations: None  Ideas of Reference:None  Suicidal  Thoughts:Suicidal Thoughts: No  Homicidal Thoughts:Homicidal Thoughts: Yes, Active HI Active Intent and/or Plan: With Intent;With Plan;Without Means to Carry Out   Sensorium  Memory:Immediate Good;Recent Good;Remote Good  Judgment:Impaired  Insight:Lacking   Executive Functions  Concentration:Fair  Attention Span:Fair  Recall:Fair  Fund of Knowledge:Fair  Language:Fair   Psychomotor Activity  Psychomotor Activity:Psychomotor Activity: Normal   Assets  Assets:Communication Skills;Desire for Improvement;Financial Resources/Insurance;Housing;Intimacy;Leisure Time;Physical Health;Resilience;Social Support   Sleep  Sleep:Sleep: Poor   Physical Exam Vitals and nursing note reviewed.  Constitutional:      Appearance: She is well-developed.  HENT:     Head: Normocephalic.  Cardiovascular:     Rate and Rhythm: Normal rate.  Pulmonary:     Effort: Pulmonary effort is normal.  Neurological:     Mental Status: She is alert and oriented to person, place, and time.  Psychiatric:        Attention and Perception: Attention normal.        Mood and Affect: Mood is anxious.        Speech: Speech is tangential.        Behavior: Behavior is cooperative.        Thought Content: Thought content includes homicidal ideation. Thought content includes homicidal plan.        Cognition and Memory: Cognition and memory normal.    Review of Systems  Constitutional: Negative.   HENT: Negative.   Eyes: Negative.   Respiratory: Negative.   Cardiovascular: Negative.   Gastrointestinal: Negative.   Genitourinary: Negative.   Musculoskeletal: Negative.   Skin: Negative.   Neurological: Negative.   Endo/Heme/Allergies: Negative.   Psychiatric/Behavioral: The patient is nervous/anxious and has insomnia.     Blood pressure 112/74, pulse 86, temperature 97.8 F (36.6 C), temperature source Tympanic, height 5\' 4"  (1.626 m), weight 200 lb (90.7 kg), SpO2 96 %. Body mass index is 34.33  kg/m.  Past Psychiatric History: Bipolar 1 disorder, major depressive disorder, recurrent severe, attention deficit disorder, schizoaffective disorder,  Is the patient at risk to self? No  Has the patient been a risk to self in the past 6 months? No .    Has the patient been a risk to self within the distant past? Yes   Is the patient a risk to others? Yes   Has the patient been a risk to others in the past 6 months? Yes   Has the patient been a risk to others within the distant past? Yes   Past Medical History:  Past Medical History:  Diagnosis Date  . ADHD (attention deficit hyperactivity disorder)   . Allergy   . Anxiety   . Bipolar disorder (HCC)   . Depression   . Eczema 12/11/13  . Schizo-affective schizophrenia (HCC)    History reviewed. No pertinent surgical history.  Family History:  Family History  Problem Relation Age of Onset  . Asthma Mother   . Stroke Father   . Heart disease Father   . Asthma Father   . Hypertension Father        pulmonary hypertension  . Heart disease Maternal Uncle   . Hypertension Maternal Grandmother   . COPD Maternal Grandmother   . Diabetes Maternal Grandfather   . Stroke Paternal Grandmother   . Diabetes Paternal Grandmother   . Diabetes Paternal Grandfather   . Leukemia Maternal Uncle     Social History:  Social History  Socioeconomic History  . Marital status: Single    Spouse name: Not on file  . Number of children: Not on file  . Years of education: Not on file  . Highest education level: Not on file  Occupational History  . Occupation: Unemployed  Tobacco Use  . Smoking status: Never Smoker  . Smokeless tobacco: Never Used  Vaping Use  . Vaping Use: Former  . Quit date: 09/01/2017  Substance and Sexual Activity  . Alcohol use: Yes    Comment: occasionally  . Drug use: Not Currently  . Sexual activity: Not Currently    Birth control/protection: Pill  Other Topics Concern  . Not on file  Social History  Narrative   Lives with mom in an apartment on the second floor.  No children.  Currently not working.  Education: college.    Social Determinants of Health   Financial Resource Strain:   . Difficulty of Paying Living Expenses: Not on file  Food Insecurity:   . Worried About Programme researcher, broadcasting/film/video in the Last Year: Not on file  . Ran Out of Food in the Last Year: Not on file  Transportation Needs:   . Lack of Transportation (Medical): Not on file  . Lack of Transportation (Non-Medical): Not on file  Physical Activity:   . Days of Exercise per Week: Not on file  . Minutes of Exercise per Session: Not on file  Stress:   . Feeling of Stress : Not on file  Social Connections:   . Frequency of Communication with Friends and Family: Not on file  . Frequency of Social Gatherings with Friends and Family: Not on file  . Attends Religious Services: Not on file  . Active Member of Clubs or Organizations: Not on file  . Attends Banker Meetings: Not on file  . Marital Status: Not on file  Intimate Partner Violence:   . Fear of Current or Ex-Partner: Not on file  . Emotionally Abused: Not on file  . Physically Abused: Not on file  . Sexually Abused: Not on file    SDOH:  SDOH Screenings   Alcohol Screen: Low Risk   . Last Alcohol Screening Score (AUDIT): 0  Depression (PHQ2-9):   . PHQ-2 Score: Not on file  Financial Resource Strain:   . Difficulty of Paying Living Expenses: Not on file  Food Insecurity:   . Worried About Programme researcher, broadcasting/film/video in the Last Year: Not on file  . Ran Out of Food in the Last Year: Not on file  Housing:   . Last Housing Risk Score: Not on file  Physical Activity:   . Days of Exercise per Week: Not on file  . Minutes of Exercise per Session: Not on file  Social Connections:   . Frequency of Communication with Friends and Family: Not on file  . Frequency of Social Gatherings with Friends and Family: Not on file  . Attends Religious Services: Not  on file  . Active Member of Clubs or Organizations: Not on file  . Attends Banker Meetings: Not on file  . Marital Status: Not on file  Stress:   . Feeling of Stress : Not on file  Tobacco Use: Low Risk   . Smoking Tobacco Use: Never Smoker  . Smokeless Tobacco Use: Never Used  Transportation Needs:   . Freight forwarder (Medical): Not on file  . Lack of Transportation (Non-Medical): Not on file    Last Labs:  [facility]  Outpatient Visit on 06/29/2020  Component Date Value Ref Range Status  . S' Lateral 06/29/2020 3.18  cm Final  . Area-P 1/2 06/29/2020 4.63  cm2 Final  Lab on 06/08/2020  Component Date Value Ref Range Status  . Vit D, 25-Hydroxy 06/08/2020 45  30 - 100 ng/mL Final   Comment: Vitamin D Status         25-OH Vitamin D: . Deficiency:                    <20 ng/mL Insufficiency:             20 - 29 ng/mL Optimal:                 > or = 30 ng/mL . For 25-OH Vitamin D testing on patients on  D2-supplementation and patients for whom quantitation  of D2 and D3 fractions is required, the QuestAssureD(TM) 25-OH VIT D, (D2,D3), LC/MS/MS is recommended: order  code 91478 (patients >68yrs). See Note 1 . Note 1 . For additional information, please refer to  http://education.Quest[facility].com/faq/FAQ199  (This link is being provided for informational/ educational purposes only.)   Admission on 05/12/2020, Discharged on 05/21/2020  Component Date Value Ref Range Status  . Hgb A1c MFr Bld 05/12/2020 5.2  4.8 - 5.6 % Final   Comment: (NOTE) Pre diabetes:          5.7%-6.4%  Diabetes:              >6.4%  Glycemic control for   <7.0% adults with diabetes   . Mean Plasma Glucose 05/12/2020 102.54  mg/dL Final   Performed at [facility], [address], [place], [place] [address]  . Cholesterol 05/12/2020 190  0 - 200 mg/dL Final  . Triglycerides 05/12/2020 38  <150 mg/dL Final  . HDL 13/03/6577 67  >40 mg/dL Final  . Total CHOL/HDL Ratio  05/12/2020 2.8  RATIO Final  . VLDL 05/12/2020 8  0 - 40 mg/dL Final  . LDL Cholesterol 05/12/2020 115* 0 - 99 mg/dL Final   Comment:        Total Cholesterol/HDL:CHD Risk Coronary Heart Disease Risk Table                     Men   Women  1/2 Average Risk   3.4   3.3  Average Risk       5.0   4.4  2 X Average Risk   9.6   7.1  3 X Average Risk  23.4   11.0        Use the calculated Patient Ratio above and the CHD Risk Table to determine the patient's CHD Risk.        ATP III CLASSIFICATION (LDL):  <100     mg/dL   Optimal  469-629  mg/dL   Near or Above                    Optimal  130-159  mg/dL   Borderline  528-413  mg/dL   High  >244     mg/dL   Very High Performed at [facility], [address] [place], [place] [address]   . TSH 05/12/2020 1.131  0.350 - 4.500 uIU/mL Final   Comment: Performed by a 3rd Generation assay with a functional sensitivity of <=0.01 uIU/mL. Performed at [facility], [address] [place], [place] [address]   . Color, Urine  05/14/2020 YELLOW  YELLOW Final  . APPearance 05/14/2020 CLEAR  CLEAR Final  . Specific Gravity, Urine 05/14/2020 1.015  1.005 - 1.030 Final  . pH 05/14/2020 6.0  5.0 - 8.0 Final  . Glucose, UA 05/14/2020 NEGATIVE  NEGATIVE mg/dL Final  . Hgb urine dipstick 05/14/2020 NEGATIVE  NEGATIVE Final  . Bilirubin Urine 05/14/2020 NEGATIVE  NEGATIVE Final  . Ketones, ur 05/14/2020 NEGATIVE  NEGATIVE mg/dL Final  . Protein, ur 40/98/1191 NEGATIVE  NEGATIVE mg/dL Final  . Nitrite 47/82/9562 NEGATIVE  NEGATIVE Final  . Glori Luis 05/14/2020 NEGATIVE  NEGATIVE Final   Performed at [facility], [address], [place], [place] [address]  . Valproic Acid Lvl 05/21/2020 62  50.0 - 100.0 ug/mL Final   Performed at [facility], [address], [place], [place] [address]  Admission on 05/11/2020, Discharged on 05/12/2020  Component Date Value Ref Range Status   . Sodium 05/11/2020 136  135 - 145 mmol/L Final  . Potassium 05/11/2020 3.6  3.5 - 5.1 mmol/L Final  . Chloride 05/11/2020 105  98 - 111 mmol/L Final  . CO2 05/11/2020 18* 22 - 32 mmol/L Final  . Glucose, Bld 05/11/2020 151* 70 - 99 mg/dL Final   Glucose reference range applies only to samples taken after fasting for at least 8 hours.  . BUN 05/11/2020 15  6 - 20 mg/dL Final  . Creatinine, Ser 05/11/2020 0.79  0.44 - 1.00 mg/dL Final  . Calcium 96/29/5284 9.0  8.9 - 10.3 mg/dL Final  . Total Protein 05/11/2020 7.0  6.5 - 8.1 g/dL Final  . Albumin 13/24/4010 3.7  3.5 - 5.0 g/dL Final  . AST 27/25/3664 17  15 - 41 U/L Final  . ALT 05/11/2020 17  0 - 44 U/L Final  . Alkaline Phosphatase 05/11/2020 71  38 - 126 U/L Final  . Total Bilirubin 05/11/2020 0.5  0.3 - 1.2 mg/dL Final  . GFR calc non Af Amer 05/11/2020 >60  >60 mL/min Final  . GFR calc Af Amer 05/11/2020 >60  >60 mL/min Final  . Anion gap 05/11/2020 13  5 - 15 Final   Performed at [facility], [address], [place], [place] [address]  . Alcohol, Ethyl (B) 05/11/2020 <10  <10 mg/dL Final   Comment: (NOTE) Lowest detectable limit for serum alcohol is 10 mg/dL.  For medical purposes only. Performed at [facility], [address], [place], [place] [address]   . Salicylate Lvl 05/11/2020 <7.0* 7.0 - 30.0 mg/dL Final   Performed at [facility], [address] [place], [place] [address]  . Acetaminophen (Tylenol), Serum 05/11/2020 <10* 10 - 30 ug/mL Final   Comment: (NOTE) Therapeutic concentrations vary significantly. A range of 10-30 ug/mL  may be an effective concentration for many patients. However, some  are best treated at concentrations outside of this range. Acetaminophen concentrations >150 ug/mL at 4 hours after ingestion  and >50 ug/mL at 12 hours after ingestion are often associated with  toxic reactions.  Performed at [facility], [address], [place], [place] [address]   . WBC  05/11/2020 8.3  4.0 - 10.5 K/uL Final  . RBC 05/11/2020 4.13  3.87 - 5.11 MIL/uL Final  . Hemoglobin 05/11/2020 12.3  12.0 - 15.0 g/dL Final  . HCT 95/18/8416 37.3  36 - 46 % Final  . MCV 05/11/2020 90.3  80.0 - 100.0 fL Final  . MCH 05/11/2020 29.8  26.0 - 34.0 pg Final  . MCHC 05/11/2020 33.0  30.0 - 36.0 g/dL Final  . RDW 16/05/9603 13.9  11.5 - 15.5 % Final  . Platelets 05/11/2020 260  150 - 400 K/uL Final  . nRBC 05/11/2020 0.0  0.0 - 0.2 % Final   Performed at [facility], [address] [place], [place] [address]  . Opiates 05/11/2020 NONE DETECTED  NONE DETECTED Final  . Cocaine 05/11/2020 NONE DETECTED  NONE DETECTED Final  . Benzodiazepines 05/11/2020 NONE DETECTED  NONE DETECTED Final  . Amphetamines 05/11/2020 NONE DETECTED  NONE DETECTED Final  . Tetrahydrocannabinol 05/11/2020 NONE DETECTED  NONE DETECTED Final  . Barbiturates 05/11/2020 NONE DETECTED  NONE DETECTED Final   Comment: (NOTE) DRUG SCREEN FOR MEDICAL PURPOSES ONLY.  IF CONFIRMATION IS NEEDED FOR ANY PURPOSE, NOTIFY LAB WITHIN 5 DAYS.  LOWEST DETECTABLE LIMITS FOR URINE DRUG SCREEN Drug Class                     Cutoff (ng/mL) Amphetamine and metabolites    1000 Barbiturate and metabolites    200 Benzodiazepine                 200 Tricyclics and metabolites     300 Opiates and metabolites        300 Cocaine and metabolites        300 THC                            50 Performed at [facility], [address] [place], [place] [address]   . I-stat hCG, quantitative 05/11/2020 <5.0  <5 mIU/mL Final  . Comment 3 05/11/2020          Final   Comment:   GEST. AGE      CONC.  (mIU/mL)   <=1 WEEK        5 - 50     2 WEEKS       50 - 500     3 WEEKS       100 - 10,000     4 WEEKS     1,000 - 30,000        FEMALE AND NON-PREGNANT FEMALE:     LESS THAN 5 mIU/mL   . SARS Coronavirus 2 by RT PCR 05/11/2020 NEGATIVE  NEGATIVE Final   Comment: (NOTE) SARS-CoV-2 target nucleic acids are NOT  DETECTED.  The SARS-CoV-2 RNA is generally detectable in upper respiratoy specimens during the acute phase of infection. The lowest concentration of SARS-CoV-2 viral copies this assay can detect is 131 copies/mL. A negative result does not preclude SARS-Cov-2 infection and should not be used as the sole basis for treatment or other patient management decisions. A negative result may occur with  improper specimen collection/handling, submission of specimen other than nasopharyngeal swab, presence of viral mutation(s) within the areas targeted by this assay, and inadequate number of viral copies (<131 copies/mL). A negative result must be combined with clinical observations, patient history, and epidemiological information. The expected result is Negative.  Fact Sheet for Patients:  https://www.moore.com/  Fact Sheet for Healthcare Providers:  https://www.young.biz/  This test is no                          t yet approved or cleared by the [place] FDA and  has been authorized for detection and/or  diagnosis of SARS-CoV-2 by FDA under an Emergency Use Authorization (EUA). This EUA will remain  in effect (meaning this test can be used) for the duration of the COVID-19 declaration under Section 564(b)(1) of the Act, 21 U.S.C. section 360bbb-3(b)(1), unless the authorization is terminated or revoked sooner.    . Influenza A by PCR 05/11/2020 NEGATIVE  NEGATIVE Final  . Influenza B by PCR 05/11/2020 NEGATIVE  NEGATIVE Final   Comment: (NOTE) The Xpert Xpress SARS-CoV-2/FLU/RSV assay is intended as an aid in  the diagnosis of influenza from Nasopharyngeal swab specimens and  should not be used as a sole basis for treatment. Nasal washings and  aspirates are unacceptable for Xpert Xpress SARS-CoV-2/FLU/RSV  testing.  Fact Sheet for Patients: https://www.moore.com/https://www.fda.gov/media/142436/download  Fact Sheet for Healthcare  Providers: https://www.young.biz/https://www.fda.gov/media/142435/download  This test is not yet approved or cleared by the [place] FDA and  has been authorized for detection and/or diagnosis of SARS-CoV-2 by  FDA under an Emergency Use Authorization (EUA). This EUA will remain  in effect (meaning this test can be used) for the duration of the  Covid-19 declaration under Section 564(b)(1) of the Act, 21  U.S.C. section 360bbb-3(b)(1), unless the authorization is  terminated or revoked. Performed at [facility], [address] [place], [place] [id number]     Allergies: Concerta [methylphenidate], Terbinafine, Trazodone and nefazodone, Clindamycin hcl, Haldol [haloperidol], and Terbinafine hcl  PTA Medications: (Not in a hospital admission)   Medical Decision Making  Discussed restarting home medications with patient and mother, discussed risk versus benefits as well as side effects.  Patient and mother in agreement with plan.  Medications: -Invega sustenna 117 mg IM next dose due 07/16/2020 -Propranolol 20 mg 3 times daily/tachycardia -Zyrtec 10 mg daily/seasonal allergies -Flonase 1 spray each nare daily/seasonal allergies -Depakote DR 250 mg daily -Vistaril 25 mg 3 times daily as needed/anxiety -Lorazepam 1 mg nightly/sleep    Recommendations  Based on my evaluation the patient does not appear to have an emergency medical condition.  Patient reviewed with Dr. Bronwen BettersLaubach.  Patient will be [facility] while awaiting inpatient psychiatric placement. Inpatient psychiatric treatment recommended.  Patient currently voluntary.  Patrcia Dollyina L Tate, FNP 07/02/20  5:28 PM
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
## 2020-07-02 NOTE — ED Triage Notes (Signed)
Received [name] at the [facility] with a chief compliant of voices, sending obscene threatening texts to her mom, delusional and paranoid. She is seeking help today to help her with the thoughts of hurting her mom and stop her from reading minds. She is very talkative and requires constant redirection back to the topic. Mom is with her in the lobby.

## 2020-07-02 NOTE — Progress Notes (Signed)
Received [name] this PM for admission workup, her mom remained in the room throughout and left after the skin assessment was completed. The EKG must be repeated after 2 failed attempts related to increased trembling. She was tearful, but cooperative. She was oriented to her new environment and offered nourishments per her choice. In route to the unit, she endorsed voices.

## 2020-07-03 DIAGNOSIS — Z20822 Contact with and (suspected) exposure to covid-19: Secondary | ICD-10-CM | POA: Diagnosis not present

## 2020-07-03 DIAGNOSIS — F312 Bipolar disorder, current episode manic severe with psychotic features: Secondary | ICD-10-CM | POA: Diagnosis not present

## 2020-07-03 LAB — PREGNANCY, URINE: Preg Test, Ur: NEGATIVE

## 2020-07-03 MED ORDER — HYDROXYZINE HCL 25 MG PO TABS
50.0000 mg | ORAL_TABLET | Freq: Once | ORAL | Status: AC
  Administered 2020-07-03: 50 mg via ORAL
  Filled 2020-07-03: qty 2

## 2020-07-03 NOTE — ED Notes (Signed)
Given sandwich and chips

## 2020-07-03 NOTE — Progress Notes (Signed)
Received [name] this AM awake in her chair bed, she showered and eventually requested breakfast. She stated hearing one voice this morning and she pushed it away. She denied feeling suicidal and homicidal at the present time. She is hoping to return home today.

## 2020-07-03 NOTE — Progress Notes (Signed)
[name] received her discharge paper, the AVS was reviewed and questions answered. She retrieved her personal belongings and was escorted to the lobby where her mom was waiting.

## 2020-07-03 NOTE — Discharge Instructions (Addendum)

## 2020-07-03 NOTE — ED Notes (Signed)
Diet, caffeine free cola given

## 2020-07-03 NOTE — ED Provider Notes (Addendum)
FBC/OBS ASAP Discharge Summary  Date and Time: 07/03/2020 11:39 AM  Name: [name]  MRN:  [id number]   Discharge Diagnoses:  Final diagnoses:  Bipolar disorder, current episode manic severe with psychotic features (HCC)    Subjective: Patient states "I feel regulated, I feel good, I feel good enough to go home."  Patient reports "I am so ashamed but the voices torment me, I know something was wrong with me yesterday, the voices have stopped really sound very far away."  Patient insightful today states "I love my mom, she is my best friend, I have been good, I would never hurt my mom."  Patient reports while mother went shopping yesterday she believes she had "cabin fever."  Patient reports readiness to discharge home.  Patient reports recent stressor includes sister who has recently given birth to a new baby.  Patient reports she does not feel that she receives the attention that she requires from her sister currently.  Today patient denies both suicidal and homicidal ideations.  Today patient denies auditory visual hallucinations.  Patient reports when she does hear voices they are very faint and she is unable to make out any specifics.  Patient contracts verbally for safety with this Clinical research associate.  Patient assessed by nurse practitioner.  Patient alert and oriented, answers appropriately.  Patient pleasant cooperative during assessment.  Patient continues to discuss various topics during assessment.  Patient reports "it is my goal to live on my own."  Patient reports she has recently began administering her own medication, with her mother supervision, in an effort to move toward her ultimate goal.  Patient gives verbal consent to speak with her mother, Kasyn Stouffer phone number [phone].  Spoke with patient's mother Elita Quick who reports "I am not afraid of her."  Patient's mother reports patient has never actually assaulted anyone.  Patient's mother reports "one day Ravleen hates me all day  long then she loves me the next day and hates someone else."  Patient's mother denies concerns for safety if patient discharges home today.   Patient's mother reports she has recently been given looking into long-term care facilities with the possibility of placing patient if an acceptable facility can be located. Patient's mother reports plan to follow-up with outpatient psychiatrist, Dr [name], tomorrow. Safety planning completed with mother who reports she has placed a lock on her bedroom door at the direction of outpatient psychiatry.  Patient's mother reports also all sharps are currently secured in home.  Also secure medications including over-the-counter medications.   Stay Summary:  Per TTS assessment: Kirra is a 24 yo female presenting as a walk in to [facility] for assessment of SI, HI, and AH. Pt denies any substance use. Pt presents to [facility] with mother, who is a good historian and does a good job of providing details about [name]'s current levels of functioning. Primary concerns are as follows:  Pt with SI and sent mother texts stating that she wanted to kill herself--then had no recollection of it later. Pt also stated to many people (will often text multiple family members everything that she is thinking about) that she wants to kill her mother. Pt reports that she had plans to take knife and stab her mother. Pt stated during assessment that this was the first time that she feels she had a "real urge" to follow through with this intent and seriously harm her mother. Pt feels bad about this, and got quite emotional during assessment talking about it. Pt also states  that she hears voices sometimes--especially with medication changes. Pt is currently under the psychiatric care of Dr. [name] and is currently counseling with [name]. Pt is taking the following medication: Invega, propranolol, depakote, and lorazepam. Pts mother states that pt has been taking 750 mg of depakote that made her feel  like a "zombie" so Dr. [name] reduced dosage down to 250 since pt was also taking invega. Pts mother reports that she feels the issues happened around this time. Pt has a history of inpatient hospitalizations in the past with similar symptoms--[facility] and [facility]. Most recent stay was 8 days at [facility] discharged 05/21/2020. Pt states that her biggest external stressor is not having a boyfriend. Pts mother reports that pt has a long behavioral health history going back to age 79.  Pts affective and emotional state appeared labile and pts mental state included fair insight and observing capacity with impaired judgement. Pt states that she is fearful that she is a danger to herself or other people--pts mother reports that she is fearful that patient is a harm to herself or others.     Total Time spent with patient: 30 minutes  Past Psychiatric History:  Past Medical History:  Past Medical History:  Diagnosis Date  . ADHD (attention deficit hyperactivity disorder)   . Allergy   . Anxiety   . Bipolar disorder (HCC)   . Depression   . Eczema 12/11/13  . Schizo-affective schizophrenia (HCC)    History reviewed. No pertinent surgical history. Family History:  Family History  Problem Relation Age of Onset  . Asthma Mother   . Stroke Father   . Heart disease Father   . Asthma Father   . Hypertension Father        pulmonary hypertension  . Heart disease Maternal Uncle   . Hypertension Maternal Grandmother   . COPD Maternal Grandmother   . Diabetes Maternal Grandfather   . Stroke Paternal Grandmother   . Diabetes Paternal Grandmother   . Diabetes Paternal Grandfather   . Leukemia Maternal Uncle    Family Psychiatric History: None reported Social History:  Social History   Substance and Sexual Activity  Alcohol Use Yes   Comment: occasionally     Social History   Substance and Sexual Activity  Drug Use Not Currently    Social History   Socioeconomic History  . Marital status:  Single    Spouse name: Not on file  . Number of children: Not on file  . Years of education: Not on file  . Highest education level: Not on file  Occupational History  . Occupation: Unemployed  Tobacco Use  . Smoking status: Never Smoker  . Smokeless tobacco: Never Used  Vaping Use  . Vaping Use: Former  . Quit date: 09/01/2017  Substance and Sexual Activity  . Alcohol use: Yes    Comment: occasionally  . Drug use: Not Currently  . Sexual activity: Not Currently    Birth control/protection: Pill  Other Topics Concern  . Not on file  Social History Narrative   Lives with mom in an apartment on the second floor.  No children.  Currently not working.  Education: college.    Social Determinants of Health   Financial Resource Strain:   . Difficulty of Paying Living Expenses: Not on file  Food Insecurity:   . Worried About Programme researcher, broadcasting/film/video in the Last Year: Not on file  . Ran Out of Food in the Last Year: Not on file  Transportation Needs:   . Freight forwarder (Medical): Not on file  . Lack of Transportation (Non-Medical): Not on file  Physical Activity:   . Days of Exercise per Week: Not on file  . Minutes of Exercise per Session: Not on file  Stress:   . Feeling of Stress : Not on file  Social Connections:   . Frequency of Communication with Friends and Family: Not on file  . Frequency of Social Gatherings with Friends and Family: Not on file  . Attends Religious Services: Not on file  . Active Member of Clubs or Organizations: Not on file  . Attends Banker Meetings: Not on file  . Marital Status: Not on file   SDOH:  SDOH Screenings   Alcohol Screen: Low Risk   . Last Alcohol Screening Score (AUDIT): 0  Depression (PHQ2-9):   . PHQ-2 Score: Not on file  Financial Resource Strain:   . Difficulty of Paying Living Expenses: Not on file  Food Insecurity:   . Worried About Programme researcher, broadcasting/film/video in the Last Year: Not on file  . Ran Out of Food in  the Last Year: Not on file  Housing:   . Last Housing Risk Score: Not on file  Physical Activity:   . Days of Exercise per Week: Not on file  . Minutes of Exercise per Session: Not on file  Social Connections:   . Frequency of Communication with Friends and Family: Not on file  . Frequency of Social Gatherings with Friends and Family: Not on file  . Attends Religious Services: Not on file  . Active Member of Clubs or Organizations: Not on file  . Attends Banker Meetings: Not on file  . Marital Status: Not on file  Stress:   . Feeling of Stress : Not on file  Tobacco Use: Low Risk   . Smoking Tobacco Use: Never Smoker  . Smokeless Tobacco Use: Never Used  Transportation Needs:   . Freight forwarder (Medical): Not on file  . Lack of Transportation (Non-Medical): Not on file    Has this patient used any form of tobacco in the last 30 days? (Cigarettes, Smokeless Tobacco, Cigars, and/or Pipes) A prescription for an FDA-approved tobacco cessation medication was offered at discharge and the patient refused  Current Medications:  Current Facility-Administered Medications  Medication Dose Route Frequency Provider Last Rate Last Admin  . acetaminophen (TYLENOL) tablet 650 mg  650 mg Oral Q6H PRN Patrcia Dolly, FNP      . alum & mag hydroxide-simeth (MAALOX/MYLANTA) 200-200-20 MG/5ML suspension 30 mL  30 mL Oral Q4H PRN Patrcia Dolly, FNP      . divalproex (DEPAKOTE) DR tablet 250 mg  250 mg Oral Daily Patrcia Dolly, FNP   250 mg at 07/03/20 0948  . fluticasone (FLONASE) 50 MCG/ACT nasal spray 1 spray  1 spray Each Nare Daily Patrcia Dolly, FNP   1 spray at 07/03/20 0948  . hydrOXYzine (ATARAX/VISTARIL) tablet 25 mg  25 mg Oral TID PRN Patrcia Dolly, FNP      . loratadine (CLARITIN) tablet 10 mg  10 mg Oral Daily Patrcia Dolly, FNP   10 mg at 07/03/20 0948  . LORazepam (ATIVAN) tablet 1 mg  1 mg Oral QHS Patrcia Dolly, FNP   1 mg at 07/02/20 2106  . magnesium hydroxide (MILK OF  MAGNESIA) suspension 30 mL  30 mL Oral Daily PRN Patrcia Dolly, FNP      .  propranolol (INDERAL) tablet 20 mg  20 mg Oral TID Patrcia Dollyate, Syrus Nakama L, FNP   20 mg at 07/03/20 [id number]   Current Outpatient Medications  Medication Sig Dispense Refill  . Paliperidone ER (INVEGA SUSTENNA) injection Inject 117 mg into the muscle every 30 (thirty) days.    . propranolol (INDERAL) 20 MG tablet Take 20 mg by mouth 3 (three) times daily.    . cetirizine (ZYRTEC) 10 MG tablet Take 1 tablet (10 mg total) by mouth daily. 30 tablet 11  . divalproex (DEPAKOTE) 250 MG DR tablet Take 3 tablets (750 mg total) by mouth daily with supper. 90 tablet 0  . fluticasone (FLONASE) 50 MCG/ACT nasal spray PLACE 2 SPRAYS INTO EACH NOSTRIL EVERY DAY 16 g 2  . hydrOXYzine (ATARAX/VISTARIL) 25 MG tablet Take 1 tablet (25 mg total) by mouth every 6 (six) hours as needed for anxiety. 75 tablet 0  . LORazepam (ATIVAN) 1 MG tablet Take 1 tablet (1 mg total) by mouth every 6 (six) hours as needed for anxiety (agitation). 4 tablet 0    PTA Medications: (Not in a hospital admission)   Musculoskeletal  Strength & Muscle Tone: within normal limits Gait & Station: normal Patient leans: N/A  Psychiatric Specialty Exam  Presentation  General Appearance: Disheveled  Eye Contact:Good  Speech:Clear and Coherent;Pressured  Speech Volume:Increased  Handedness:Right   Mood and Affect  Mood:Anxious  Affect:Labile   Thought Process  Thought Processes:Coherent  Descriptions of Associations:Tangential  Orientation:Full (Time, Place and Person)  Thought Content:Tangential  Hallucinations:Hallucinations: None  Ideas of Reference:None  Suicidal Thoughts:Suicidal Thoughts: No  Homicidal Thoughts:Homicidal Thoughts: Yes, Active HI Active Intent and/or Plan: With Intent;With Plan;Without Means to Carry Out   Sensorium  Memory:Immediate Good;Recent Good;Remote Good  Judgment:Impaired  Insight:Lacking   Executive Functions   Concentration:Fair  Attention Span:Fair  Recall:Fair  Fund of Knowledge:Fair  Language:Fair   Psychomotor Activity  Psychomotor Activity:Psychomotor Activity: Normal   Assets  Assets:Communication Skills;Desire for Improvement;Financial Resources/Insurance;Housing;Intimacy;Leisure Time;Physical Health;Resilience;Social Support   Sleep  Sleep:Sleep: Poor   Physical Exam  Physical Exam Vitals and nursing note reviewed.  Constitutional:      Appearance: She is well-developed.  HENT:     Head: Normocephalic.  Cardiovascular:     Rate and Rhythm: Normal rate.  Pulmonary:     Effort: Pulmonary effort is normal.  Neurological:     Mental Status: She is alert and oriented to person, place, and time.  Psychiatric:        Attention and Perception: Attention and perception normal.        Mood and Affect: Mood and affect normal.        Speech: Speech is tangential.        Behavior: Behavior normal. Behavior is cooperative.        Thought Content: Thought content normal.        Cognition and Memory: Cognition and memory normal.        Judgment: Judgment normal.    Review of Systems  Constitutional: Negative.   HENT: Negative.   Eyes: Negative.   Respiratory: Negative.   Cardiovascular: Negative.   Gastrointestinal: Negative.   Genitourinary: Negative.   Musculoskeletal: Negative.   Skin: Negative.   Neurological: Negative.   Endo/Heme/Allergies: Negative.   Psychiatric/Behavioral: Positive for hallucinations.   Blood pressure 114/80, pulse 84, temperature 98.5 F (36.9 C), temperature source Oral, resp. rate 18, height 5\' 4"  (1.626 m), weight 200 lb (90.7 kg), SpO2 97 %. Body mass index is 34.33 kg/m.  Demographic Factors:  NA  Loss Factors: NA  Historical Factors: NA  Risk Reduction Factors:   Living with another person, especially a relative, Positive social support, Positive therapeutic relationship and Positive coping skills or problem solving  skills  Continued Clinical Symptoms:  Previous Psychiatric Diagnoses and Treatments  Cognitive Features That Contribute To Risk:  None    Suicide Risk:  Minimal: No identifiable suicidal ideation.  Patients presenting with no risk factors but with morbid ruminations; may be classified as minimal risk based on the severity of the depressive symptoms  Plan Of Care/Follow-up recommendations:  Other:  Patient reviewed with Dr.Laubach.   Follow-up with established outpatient psychiatrist, Dr. [name] and therapist, Dr Enid Skeens.  Medications: -Depakote 250 mg daily -Hydroxyzine 25 mg 3 times daily as needed/anxiety -Ativan 1 mg nightly -propranolol 20 mg 3 times daily  Disposition: Discharge  Patrcia Dolly, FNP 07/03/2020, 11:39 AM

## 2020-07-03 NOTE — ED Notes (Signed)
Breakfast - oatmeal

## 2020-07-03 NOTE — ED Notes (Signed)
Pt is awake and sitting on her bed writing down some of her thoughts. Will continue to monitor pt for safety

## 2020-07-03 NOTE — ED Notes (Addendum)
Pt is in and off the unit going back and fourth to bathroom and using the phone. Pt will not lay down and rest. Will continue to monitor pt for safety

## 2020-07-04 LAB — PROLACTIN: Prolactin: 309 ng/mL — ABNORMAL HIGH (ref 4.8–23.3)

## 2020-07-05 ENCOUNTER — Telehealth: Payer: Self-pay

## 2020-07-05 DIAGNOSIS — F312 Bipolar disorder, current episode manic severe with psychotic features: Secondary | ICD-10-CM | POA: Diagnosis not present

## 2020-07-05 NOTE — Telephone Encounter (Signed)
Left message on patients voicemail to please return our call.   I will also send the patient a letter at this time as we have tried to reach the patient x3 with no success.

## 2020-07-05 NOTE — Telephone Encounter (Signed)
-----   Message from Thomasene Ripple, DO sent at 06/29/2020  5:53 PM EST ----- Normal echo

## 2020-07-08 ENCOUNTER — Telehealth (HOSPITAL_COMMUNITY): Payer: Self-pay | Admitting: Family Medicine

## 2020-07-08 NOTE — Telephone Encounter (Signed)
Care Management - Follow Up [facility] Discharges   Voicemail is not set up.   Writer left name and phone number for the patent to call back if further assistance is needed in scheduling a follow up appointment with an outpatient provider.

## 2020-07-11 ENCOUNTER — Telehealth: Payer: Self-pay

## 2020-07-11 MED FILL — INVEGA SUSTENNA 234 MG PREF: 234 | 30 days supply | Qty: 2 | Fill #1

## 2020-07-11 NOTE — Telephone Encounter (Signed)
Tried calling patient. No answer and voicemail is full so I can not leave a message at this time.  

## 2020-07-11 NOTE — Telephone Encounter (Signed)
-----   Message from Kardie Tobb, DO sent at 07/06/2020  9:45 PM EST ----- Overall there is no significant arrhythmia.  Patient did have a lot of sinus tachycardia.  Continue the propranolol for now.  Diagnosis is inappropriate sinus tachycardia.  We can discuss further at your next visit 

## 2020-07-12 ENCOUNTER — Telehealth: Payer: Self-pay

## 2020-07-12 MED FILL — FLUTICASONE PROP 50 MCG SPR: 50 | 30 days supply | Qty: 16 | Fill #0

## 2020-07-12 NOTE — Telephone Encounter (Signed)
-----   Message from Kardie Tobb, DO sent at 07/06/2020  9:45 PM EST ----- Overall there is no significant arrhythmia.  Patient did have a lot of sinus tachycardia.  Continue the propranolol for now.  Diagnosis is inappropriate sinus tachycardia.  We can discuss further at your next visit 

## 2020-07-12 NOTE — Telephone Encounter (Signed)
Tried calling patient. No answer and no voicemail set up for me to leave a message. 

## 2020-07-13 ENCOUNTER — Telehealth: Payer: Self-pay

## 2020-07-13 DIAGNOSIS — F4323 Adjustment disorder with mixed anxiety and depressed mood: Secondary | ICD-10-CM | POA: Diagnosis not present

## 2020-07-13 NOTE — Telephone Encounter (Signed)
-----   Message from Jaclyn Ripple, DO sent at 07/06/2020  9:45 PM EST ----- Overall there is no significant arrhythmia.  Patient did have a lot of sinus tachycardia.  Continue the propranolol for now.  Diagnosis is inappropriate sinus tachycardia.  We can discuss further at your next visit

## 2020-07-13 NOTE — Telephone Encounter (Signed)
Tried calling patient. No answer and no voicemail set up for me to leave a message.  I will send the patient a letter at this time after trying to reach her x3 with no success.  

## 2020-07-14 ENCOUNTER — Other Ambulatory Visit (HOSPITAL_COMMUNITY): Payer: Self-pay | Admitting: Specialist

## 2020-07-14 DIAGNOSIS — F312 Bipolar disorder, current episode manic severe with psychotic features: Secondary | ICD-10-CM | POA: Diagnosis not present

## 2020-07-15 ENCOUNTER — Other Ambulatory Visit (HOSPITAL_COMMUNITY): Payer: Self-pay | Admitting: Specialist

## 2020-07-15 MED FILL — QUEtiapine FUMARATE ER 400: 400 | 30 days supply | Qty: 60 | Fill #0

## 2020-07-15 MED FILL — INVEGA TRINZA 546 MG/1.75 M: 546 | 30 days supply | Qty: 2 | Fill #0

## 2020-07-20 ENCOUNTER — Other Ambulatory Visit (HOSPITAL_COMMUNITY): Payer: Self-pay | Admitting: Specialist

## 2020-07-20 DIAGNOSIS — F4323 Adjustment disorder with mixed anxiety and depressed mood: Secondary | ICD-10-CM | POA: Diagnosis not present

## 2020-07-20 MED FILL — INVEGA TRINZA 546 MG/1.75 M: 546 | 90 days supply | Qty: 2 | Fill #0

## 2020-07-20 MED FILL — PROPRANOLOL 20 MG TABLET: 20 | 30 days supply | Qty: 90 | Fill #0

## 2020-07-29 DIAGNOSIS — F4323 Adjustment disorder with mixed anxiety and depressed mood: Secondary | ICD-10-CM | POA: Diagnosis not present

## 2020-08-16 DIAGNOSIS — Z03818 Encounter for observation for suspected exposure to other biological agents ruled out: Secondary | ICD-10-CM | POA: Diagnosis not present

## 2020-08-17 MED FILL — INVEGA TRINZA 546 MG/1.75 M: 546 | 90 days supply | Qty: 2 | Fill #0

## 2020-08-24 MED FILL — FLUTICASONE PROP 50 MCG SPR: 50 | 30 days supply | Qty: 16 | Fill #0

## 2020-08-24 MED FILL — PROPRANOLOL 20 MG TABLET: 20 | 30 days supply | Qty: 90 | Fill #0

## 2020-08-29 ENCOUNTER — Other Ambulatory Visit: Payer: Self-pay

## 2020-08-29 DIAGNOSIS — F909 Attention-deficit hyperactivity disorder, unspecified type: Secondary | ICD-10-CM | POA: Insufficient documentation

## 2020-08-29 DIAGNOSIS — T7840XA Allergy, unspecified, initial encounter: Secondary | ICD-10-CM | POA: Insufficient documentation

## 2020-08-29 DIAGNOSIS — F32A Depression, unspecified: Secondary | ICD-10-CM | POA: Insufficient documentation

## 2020-08-29 DIAGNOSIS — F259 Schizoaffective disorder, unspecified: Secondary | ICD-10-CM | POA: Insufficient documentation

## 2020-08-30 MED FILL — QUEtiapine FUMARATE ER 400: 400 | 30 days supply | Qty: 60 | Fill #0

## 2020-08-31 ENCOUNTER — Ambulatory Visit (INDEPENDENT_AMBULATORY_CARE_PROVIDER_SITE_OTHER): Payer: Medicare Other | Admitting: Cardiology

## 2020-08-31 ENCOUNTER — Other Ambulatory Visit: Payer: Self-pay

## 2020-08-31 ENCOUNTER — Encounter: Payer: Self-pay | Admitting: Cardiology

## 2020-08-31 VITALS — BP 118/68 | HR 88 | Ht 64.0 in | Wt 215.0 lb

## 2020-08-31 DIAGNOSIS — R002 Palpitations: Secondary | ICD-10-CM

## 2020-08-31 NOTE — Patient Instructions (Signed)
Medication Instructions:  Your physician recommends that you continue on your current medications as directed. Please refer to the Current Medication list given to you today.  *If you need a refill on your cardiac medications before your next appointment, please call your pharmacy*   Lab Work: None ordered  If you have labs (blood work) drawn today and your tests are completely normal, you will receive your results only by: . MyChart Message (if you have MyChart) OR . A paper copy in the mail If you have any lab test that is abnormal or we need to change your treatment, we will call you to review the results.   Testing/Procedures: None ordered   Follow-Up: At [facility], you and your health needs are our priority.  As part of our continuing mission to provide you with exceptional heart care, we have created designated Provider Care Teams.  These Care Teams include your primary Cardiologist (physician) and Advanced Practice Providers (APPs -  Physician Assistants and Nurse Practitioners) who all work together to provide you with the care you need, when you need it.  We recommend signing up for the patient portal called "MyChart".  Sign up information is provided on this After Visit Summary.  MyChart is used to connect with patients for Virtual Visits (Telemedicine).  Patients are able to view lab/test results, encounter notes, upcoming appointments, etc.  Non-urgent messages can be sent to your provider as well.   To learn more about what you can do with MyChart, go to [email or web address].    Your next appointment:   12 month(s)  The format for your next appointment:   In Person  Provider:   Kardie Tobb, DO   Other Instructions  

## 2020-08-31 NOTE — Progress Notes (Signed)
Cardiology Office Note:    Date:  08/31/2020   ID:  [name], DOB 03/24/1996, MRN [id number]  PCP:  [name], Jaclyn Congress, DO  Cardiologist:  Jaclyn Ripple, DO  Electrophysiologist:  None   Referring MD: [name], [name], *   " I am doing a lot better"  History of Present Illness:    [name] is a 25 y.o. female with a hx of anxiety, bipolar disorder who is here today for follow-up visit.  The patient is with her her mother who is her primary caretaker.  I saw the patient back in October 2021 at that time she had significant palpitations she had been placed on metoprolol then transition to propanolol.  During her visit she was on propanolol as the patient wear a monitor to get an echocardiogram as well as we optimize her propanolol to 3 times a day.  She is here today for follow-up visit.  In the interim she was able to get her testing done her prior results have been called out to her.  They are essentially all normal.     Her medication for her bipolar disorder has been optimized she is doing very well.  She is very happy.  She has not had a lot of symptoms from her palpitations.  Her only problem is the fact that she feels that the Seroquel is making her gain more weight.  She has been discussing with her psychiatric care team about this.   Past Medical History:  Diagnosis Date  . Acute vaginitis 05/20/2019  . ADD (attention deficit disorder) 04/24/2011  . ADHD (attention deficit hyperactivity disorder)   . Allergy   . Anxiety   . Benzodiazepine dependence (HCC) 05/12/2020  . Bipolar disorder (HCC)   . Bipolar disorder, current episode manic severe with psychotic features (HCC)   . Bipolar I disorder (HCC) 05/12/2020  . Constipation 04/03/2018  . Delirium tremens (HCC) 05/20/2019  . Depression   . Eczema 12/11/13  . Genital herpes simplex 10/24/2017  . High risk heterosexual behavior 05/20/2019  . MDD (major depressive disorder), recurrent severe, without psychosis  (HCC) 02/11/2018  . Morbid obesity (HCC) 09/06/2019  . Pelvic pain 01/31/2018  . Possible pregnancy 09/06/2019  . Preventative health care 03/16/2013  . Schizo-affective schizophrenia (HCC)   . Schizoaffective disorder (HCC) 03/08/2018  . Seizure (HCC) 05/20/2019  . Severe bipolar I disorder, current or most recent episode depressed (HCC) 03/30/2017  . STD exposure 05/13/2015  . Syncope 03/03/2017  . Vitamin D deficiency 03/10/2019    History reviewed. No pertinent surgical history.  Current Medications: Current Meds  Medication Sig  . cetirizine (ZYRTEC) 10 MG tablet Take 1 tablet (10 mg total) by mouth daily.  . D3-50 1.25 MG (50000 UT) capsule Take 100,000 Units by mouth once a week.  . divalproex (DEPAKOTE ER) 250 MG 24 hr tablet Take 750 mg by mouth at bedtime.  . divalproex (DEPAKOTE) 250 MG DR tablet Take 3 tablets (750 mg total) by mouth daily with supper.  . fluticasone (FLONASE) 50 MCG/ACT nasal spray PLACE 2 SPRAYS INTO EACH NOSTRIL EVERY DAY  . hydrOXYzine (ATARAX/VISTARIL) 25 MG tablet Take 1 tablet (25 mg total) by mouth every 6 (six) hours as needed for anxiety.  Marland Kitchen LORazepam (ATIVAN) 1 MG tablet Take 1 tablet (1 mg total) by mouth every 6 (six) hours as needed for anxiety (agitation).  . Paliperidone ER (INVEGA SUSTENNA) injection Inject 117 mg into the muscle every 30 (thirty)  days. EVERY 90 DAYS  . propranolol (INDERAL) 20 MG tablet Take 20 mg by mouth 3 (three) times daily.  Marland Kitchen triamcinolone (KENALOG) 0.1 %      Allergies:   Concerta [methylphenidate], Terbinafine, Trazodone and nefazodone, Clindamycin hcl, Haldol [haloperidol], and Terbinafine hcl   Social History   Socioeconomic History  . Marital status: Single    Spouse name: Not on file  . Number of children: Not on file  . Years of education: Not on file  . Highest education level: Not on file  Occupational History  . Occupation: Unemployed  Tobacco Use  . Smoking status: Never Smoker  . Smokeless tobacco: Never  Used  Vaping Use  . Vaping Use: Former  . Quit date: 09/01/2017  Substance and Sexual Activity  . Alcohol use: Yes    Comment: occasionally  . Drug use: Not Currently  . Sexual activity: Not Currently    Birth control/protection: Pill  Other Topics Concern  . Not on file  Social History Narrative   Lives with mom in an apartment on the second floor.  No children.  Currently not working.  Education: college.    Social Determinants of Health   Financial Resource Strain: Not on file  Food Insecurity: Not on file  Transportation Needs: Not on file  Physical Activity: Not on file  Stress: Not on file  Social Connections: Not on file     Family History: The patient's family history includes Asthma in her father and mother; COPD in her maternal grandmother; Diabetes in her maternal grandfather, paternal grandfather, and paternal grandmother; Heart disease in her father and maternal uncle; Hypertension in her father and maternal grandmother; Leukemia in her maternal uncle; Stroke in her father and paternal grandmother.  ROS:   Review of Systems  Constitution: Negative for decreased appetite, fever and weight gain.  HENT: Negative for congestion, ear discharge, hoarse voice and sore throat.   Eyes: Negative for discharge, redness, vision loss in right eye and visual halos.  Cardiovascular: Negative for chest pain, dyspnea on exertion, leg swelling, orthopnea and palpitations.  Respiratory: Negative for cough, hemoptysis, shortness of breath and snoring.   Endocrine: Negative for heat intolerance and polyphagia.  Hematologic/Lymphatic: Negative for bleeding problem. Does not bruise/bleed easily.  Skin: Negative for flushing, nail changes, rash and suspicious lesions.  Musculoskeletal: Negative for arthritis, joint pain, muscle cramps, myalgias, neck pain and stiffness.  Gastrointestinal: Negative for abdominal pain, bowel incontinence, diarrhea and excessive appetite.  Genitourinary:  Negative for decreased libido, genital sores and incomplete emptying.  Neurological: Negative for brief paralysis, focal weakness, headaches and loss of balance.  Psychiatric/Behavioral: Negative for altered mental status, depression and suicidal ideas.  Allergic/Immunologic: Negative for HIV exposure and persistent infections.    EKGs/Labs/Other Studies Reviewed:    The following studies were reviewed today:   EKG: None today Recent Labs: 07/02/2020: ALT 15; BUN 10; Creatinine, Ser 0.65; Hemoglobin 13.4; Magnesium 2.3; Platelets 307; Potassium 4.0; Sodium 138; TSH 1.913  Recent Lipid Panel    Component Value Date/Time   CHOL 186 07/02/2020 1830   TRIG 60 07/02/2020 1830   HDL 56 07/02/2020 1830   CHOLHDL 3.3 07/02/2020 1830   VLDL 12 07/02/2020 1830   LDLCALC 118 (H) 07/02/2020 1830    Physical Exam:    VS:  BP 118/68   Pulse 88   Ht 5\' 4"  (1.626 m)   Wt 215 lb (97.5 kg)   SpO2 96%   BMI 36.90 kg/m  Wt Readings from Last 3 Encounters:  08/31/20 215 lb (97.5 kg)  06/08/20 206 lb (93.4 kg)  05/26/20 209 lb 6.4 oz (95 kg)     GEN: Well nourished, well developed in no acute distress HEENT: Normal NECK: No JVD; No carotid bruits LYMPHATICS: No lymphadenopathy CARDIAC: S1S2 noted,RRR, no murmurs, rubs, gallops RESPIRATORY:  Clear to auscultation without rales, wheezing or rhonchi  ABDOMEN: Soft, non-tender, non-distended, +bowel sounds, no guarding. EXTREMITIES: No edema, No cyanosis, no clubbing MUSCULOSKELETAL:  No deformity  SKIN: Warm and dry NEUROLOGIC:  Alert and oriented x 3, non-focal PSYCHIATRIC:  Normal affect, good insight  ASSESSMENT:    1. Palpitations   2. Morbid obesity (HCC)    PLAN:     1.  She is doing well from cardiovascular standpoint.  We reviewed her testing results again no questions at this time.  She will remain on her propanolol with no changes.  2. the patient understands the need to lose weight with diet and exercise. We have  discussed specific strategies for this.  The patient is in agreement with the above plan. The patient left the office in stable condition.  The patient will follow up in 1 year or sooner if needed.   Medication Adjustments/Labs and Tests Ordered: Current medicines are reviewed at length with the patient today.  Concerns regarding medicines are outlined above.  No orders of the defined types were placed in this encounter.  No orders of the defined types were placed in this encounter.   Patient Instructions  Medication Instructions:  Your physician recommends that you continue on your current medications as directed. Please refer to the Current Medication list given to you today.  *If you need a refill on your cardiac medications before your next appointment, please call your pharmacy*   Lab Work: None ordered  If you have labs (blood work) drawn today and your tests are completely normal, you will receive your results only by: Marland Kitchen. MyChart Message (if you have MyChart) OR . A paper copy in the mail If you have any lab test that is abnormal or we need to change your treatment, we will call you to review the results.   Testing/Procedures: None ordered   Follow-Up: At [facility], you and your health needs are our priority.  As part of our continuing mission to provide you with exceptional heart care, we have created designated Provider Care Teams.  These Care Teams include your primary Cardiologist (physician) and Advanced Practice Providers (APPs -  Physician Assistants and Nurse Practitioners) who all work together to provide you with the care you need, when you need it.  We recommend signing up for the patient portal called "MyChart".  Sign up information is provided on this After Visit Summary.  MyChart is used to connect with patients for Virtual Visits (Telemedicine).  Patients are able to view lab/test results, encounter notes, upcoming appointments, etc.  Non-urgent messages  can be sent to your provider as well.   To learn more about what you can do with MyChart, go to [email or web address].    Your next appointment:   12 month(s)  The format for your next appointment:   In Person  Provider:   Thomasene RippleKardie Nyari Olsson, DO   Other Instructions      Adopting a Healthy Lifestyle.  Know what a healthy weight is for you (roughly BMI <25) and aim to maintain this   Aim for 7+ servings of fruits and vegetables daily   65-80+ fluid ounces  of water or unsweet tea for healthy kidneys   Limit to max 1 drink of alcohol per day; avoid smoking/tobacco   Limit animal fats in diet for cholesterol and heart health - choose grass fed whenever available   Avoid highly processed foods, and foods high in saturated/trans fats   Aim for low stress - take time to unwind and care for your mental health   Aim for 150 min of moderate intensity exercise weekly for heart health, and weights twice weekly for bone health   Aim for 7-9 hours of sleep daily   When it comes to diets, agreement about the perfect plan isnt easy to find, even among the experts. Experts at the [facility] [employer] developed an idea known as the Healthy Eating Plate. Just imagine a plate divided into logical, healthy portions.   The emphasis is on diet quality:   Load up on vegetables and fruits - one-half of your plate: Aim for color and variety, and remember that potatoes dont count.   Go for whole grains - one-quarter of your plate: Whole wheat, barley, wheat berries, quinoa, oats, brown rice, and foods made with them. If you want pasta, go with whole wheat pasta.   Protein power - one-quarter of your plate: Fish, chicken, beans, and nuts are all healthy, versatile protein sources. Limit red meat.   The diet, however, does go beyond the plate, offering a few other suggestions.   Use healthy plant oils, such as olive, canola, soy, corn, sunflower and peanut. Check the labels, and avoid  partially hydrogenated oil, which have unhealthy trans fats.   If youre thirsty, drink water. Coffee and tea are good in moderation, but skip sugary drinks and limit milk and dairy products to one or two daily servings.   The type of carbohydrate in the diet is more important than the amount. Some sources of carbohydrates, such as vegetables, fruits, whole grains, and beans-are healthier than others.   Finally, stay active  Signed, Jaclyn Ripple, DO  08/31/2020 12:05 PM    [facility]

## 2020-09-09 ENCOUNTER — Encounter (INDEPENDENT_AMBULATORY_CARE_PROVIDER_SITE_OTHER): Payer: Self-pay

## 2020-10-03 ENCOUNTER — Other Ambulatory Visit: Payer: Self-pay | Admitting: Family Medicine

## 2020-10-03 DIAGNOSIS — J302 Other seasonal allergic rhinitis: Secondary | ICD-10-CM

## 2020-10-03 MED FILL — FLUTICASONE PROP 50 MCG SPR: 50 | 30 days supply | Qty: 16 | Fill #1

## 2020-10-03 MED FILL — QUEtiapine FUMARATE ER 400: 400 | 30 days supply | Qty: 60 | Fill #1

## 2020-10-03 MED FILL — PROPRANOLOL 20 MG TABLET: 20 | 30 days supply | Qty: 90 | Fill #1

## 2020-10-06 ENCOUNTER — Encounter: Payer: Self-pay | Admitting: Family Medicine

## 2020-10-06 ENCOUNTER — Ambulatory Visit (INDEPENDENT_AMBULATORY_CARE_PROVIDER_SITE_OTHER): Payer: Medicare Other | Admitting: Family Medicine

## 2020-10-06 ENCOUNTER — Other Ambulatory Visit: Payer: Self-pay

## 2020-10-06 VITALS — BP 118/72 | HR 87 | Temp 98.0°F | Resp 18 | Wt 223.4 lb

## 2020-10-06 DIAGNOSIS — H9202 Otalgia, left ear: Secondary | ICD-10-CM | POA: Diagnosis not present

## 2020-10-06 DIAGNOSIS — E559 Vitamin D deficiency, unspecified: Secondary | ICD-10-CM | POA: Diagnosis not present

## 2020-10-06 DIAGNOSIS — N912 Amenorrhea, unspecified: Secondary | ICD-10-CM | POA: Diagnosis not present

## 2020-10-06 LAB — COMPREHENSIVE METABOLIC PANEL
ALT: 15 U/L (ref 0–35)
AST: 13 U/L (ref 0–37)
Albumin: 4.2 g/dL (ref 3.5–5.2)
Alkaline Phosphatase: 75 U/L (ref 39–117)
BUN: 17 mg/dL (ref 6–23)
CO2: 28 mEq/L (ref 19–32)
Calcium: 9.6 mg/dL (ref 8.4–10.5)
Chloride: 103 mEq/L (ref 96–112)
Creatinine, Ser: 0.78 mg/dL (ref 0.40–1.20)
GFR: 104.64 mL/min (ref >60.00)
Glucose, Bld: 93 mg/dL (ref 70–99)
Potassium: 4.6 mEq/L (ref 3.5–5.1)
Sodium: 137 mEq/L (ref 135–145)
Total Bilirubin: 0.5 mg/dL (ref 0.2–1.2)
Total Protein: 7.1 g/dL (ref 6.0–8.3)

## 2020-10-06 LAB — CBC WITH DIFFERENTIAL/PLATELET
Basophils Absolute: 0 10*3/uL (ref 0.0–0.1)
Basophils Relative: 0.7 % (ref 0.0–3.0)
Eosinophils Absolute: 0.2 10*3/uL (ref 0.0–0.7)
Eosinophils Relative: 3.6 % (ref 0.0–5.0)
HCT: 39.1 % (ref 36.0–46.0)
Hemoglobin: 13.1 g/dL (ref 12.0–15.0)
Lymphocytes Relative: 38.8 % (ref 12.0–46.0)
Lymphs Abs: 2.2 10*3/uL (ref 0.7–4.0)
MCHC: 33.5 g/dL (ref 30.0–36.0)
MCV: 89.1 fl (ref 78.0–100.0)
Monocytes Absolute: 0.6 10*3/uL (ref 0.1–1.0)
Monocytes Relative: 10.7 % (ref 3.0–12.0)
Neutro Abs: 2.6 10*3/uL (ref 1.4–7.7)
Neutrophils Relative %: 46.2 % (ref 43.0–77.0)
Platelets: 293 10*3/uL (ref 150.0–400.0)
RBC: 4.39 Mil/uL (ref 3.87–5.11)
RDW: 14.8 % (ref 11.5–15.5)
WBC: 5.6 10*3/uL (ref 4.0–10.5)

## 2020-10-06 LAB — LIPID PANEL
Cholesterol: 196 mg/dL (ref 0–200)
HDL: 54.3 mg/dL (ref >39.00)
LDL Cholesterol: 121 mg/dL — ABNORMAL HIGH (ref 0–99)
NonHDL: 141.35
Total CHOL/HDL Ratio: 4
Triglycerides: 100 mg/dL (ref 0.0–149.0)
VLDL: 20 mg/dL (ref 0.0–40.0)

## 2020-10-06 LAB — POCT URINE PREGNANCY: Preg Test, Ur: NEGATIVE

## 2020-10-06 LAB — VITAMIN D 25 HYDROXY (VIT D DEFICIENCY, FRACTURES): VITD: 57.92 ng/mL (ref 30.00–100.00)

## 2020-10-06 LAB — TSH: TSH: 1.33 u[IU]/mL (ref 0.35–4.50)

## 2020-10-06 NOTE — Assessment & Plan Note (Signed)
Exam normal and pain resolved rto prn

## 2020-10-06 NOTE — Patient Instructions (Signed)

## 2020-10-06 NOTE — Assessment & Plan Note (Signed)
preg test neg May be due to new med from psych Will refer to gyn for further eval

## 2020-10-06 NOTE — Progress Notes (Signed)
Patient ID: [name], female    DOB: 1995/10/06  Age: 25 y.o. MRN: [id number]    Subjective:  Subjective  HPI [name] presents for amenorrhea -- last period 11/ 8-15.  She has not had one since-- 2 home preg were negative   Review of Systems  Constitutional: Negative for appetite change, diaphoresis, fatigue and unexpected weight change.  Eyes: Negative for pain, redness and visual disturbance.  Respiratory: Negative for cough, chest tightness, shortness of breath and wheezing.   Cardiovascular: Negative for chest pain, palpitations and leg swelling.  Endocrine: Negative for cold intolerance, heat intolerance, polydipsia, polyphagia and polyuria.  Genitourinary: Negative for difficulty urinating, dysuria and frequency.  Neurological: Negative for dizziness, light-headedness, numbness and headaches.    History Past Medical History:  Diagnosis Date  . Acute vaginitis 05/20/2019  . ADD (attention deficit disorder) 04/24/2011  . ADHD (attention deficit hyperactivity disorder)   . Allergy   . Anxiety   . Benzodiazepine dependence (HCC) 05/12/2020  . Bipolar disorder (HCC)   . Bipolar disorder, current episode manic severe with psychotic features (HCC)   . Bipolar I disorder (HCC) 05/12/2020  . Constipation 04/03/2018  . Delirium tremens (HCC) 05/20/2019  . Depression   . Eczema 12/11/13  . Genital herpes simplex 10/24/2017  . High risk heterosexual behavior 05/20/2019  . MDD (major depressive disorder), recurrent severe, without psychosis (HCC) 02/11/2018  . Morbid obesity (HCC) 09/06/2019  . Pelvic pain 01/31/2018  . Possible pregnancy 09/06/2019  . Preventative health care 03/16/2013  . Schizo-affective schizophrenia (HCC)   . Schizoaffective disorder (HCC) 03/08/2018  . Seizure (HCC) 05/20/2019  . Severe bipolar I disorder, current or most recent episode depressed (HCC) 03/30/2017  . STD exposure 05/13/2015  . Syncope 03/03/2017  . Vitamin D deficiency 03/10/2019    She has  no past surgical history on file.   Her family history includes Asthma in her father and mother; COPD in her maternal grandmother; Diabetes in her maternal grandfather, paternal grandfather, and paternal grandmother; Heart disease in her father and maternal uncle; Hypertension in her father and maternal grandmother; Leukemia in her maternal uncle; Stroke in her father and paternal grandmother.She reports that she has never smoked. She has never used smokeless tobacco. She reports current alcohol use. She reports previous drug use.  Current Outpatient Medications on File Prior to Visit  Medication Sig Dispense Refill  . cetirizine (ZYRTEC) 10 MG tablet TAKE 1/2 TABLET (5 MG TOTAL) BY MOUTH DAILY. 100 tablet 1  . D3-50 1.25 MG (50000 UT) capsule Take 100,000 Units by mouth 2 (two) times a week.    . fluticasone (FLONASE) 50 MCG/ACT nasal spray PLACE 2 SPRAYS INTO EACH NOSTRIL EVERY DAY 16 g 2  . LORazepam (ATIVAN) 1 MG tablet Take 1 tablet (1 mg total) by mouth every 6 (six) hours as needed for anxiety (agitation). 4 tablet 0  . Paliperidone ER (INVEGA SUSTENNA) injection Inject 117 mg into the muscle every 30 (thirty) days. EVERY 90 DAYS    . propranolol (INDERAL) 20 MG tablet Take 20 mg by mouth 3 (three) times daily.    Marland Kitchen triamcinolone (KENALOG) 0.1 %     . QUEtiapine (SEROQUEL XR) 400 MG 24 hr tablet      No current facility-administered medications on file prior to visit.     Objective:  Objective  Physical Exam Vitals and nursing note reviewed.  Constitutional:      Appearance: She is well-developed and well-nourished.  HENT:  Head: Normocephalic and atraumatic.     Right Ear: Tympanic membrane and external ear normal. There is no impacted cerumen.     Left Ear: Tympanic membrane and external ear normal. There is no impacted cerumen.  Eyes:     Extraocular Movements: EOM normal.     Conjunctiva/sclera: Conjunctivae normal.  Neck:     Thyroid: No thyromegaly.     Vascular: No  carotid bruit or JVD.  Cardiovascular:     Rate and Rhythm: Normal rate and regular rhythm.     Heart sounds: Normal heart sounds. No murmur heard.   Pulmonary:     Effort: Pulmonary effort is normal. No respiratory distress.     Breath sounds: Normal breath sounds. No wheezing or rales.  Chest:     Chest wall: No tenderness.  Musculoskeletal:        General: No edema.     Cervical back: Normal range of motion and neck supple.  Neurological:     Mental Status: She is alert and oriented to person, place, and time.  Psychiatric:        Mood and Affect: Mood and affect normal.    BP 118/72 (BP Location: Left Arm, Patient Position: Sitting, Cuff Size: Large)   Pulse 87   Temp 98 F (36.7 C) (Oral)   Resp 18   Wt 223 lb 6.4 oz (101.3 kg)   SpO2 97%   BMI 38.35 kg/m  Wt Readings from Last 3 Encounters:  10/06/20 223 lb 6.4 oz (101.3 kg)  08/31/20 215 lb (97.5 kg)  06/08/20 206 lb (93.4 kg)     Lab Results  Component Value Date   WBC 7.7 07/02/2020   HGB 13.4 07/02/2020   HCT 40.8 07/02/2020   PLT 307 07/02/2020   GLUCOSE 96 07/02/2020   CHOL 186 07/02/2020   TRIG 60 07/02/2020   HDL 56 07/02/2020   LDLCALC 118 (H) 07/02/2020   ALT 15 07/02/2020   AST 17 07/02/2020   NA 138 07/02/2020   K 4.0 07/02/2020   CL 104 07/02/2020   CREATININE 0.65 07/02/2020   BUN 10 07/02/2020   CO2 22 07/02/2020   TSH 1.913 07/02/2020   HGBA1C 5.0 07/02/2020   MICROALBUR 0.6 08/02/2014    No results found.   Assessment & Plan:  Plan  I have discontinued [name]'s hydrOXYzine, divalproex, and divalproex. I am also having her maintain her LORazepam, fluticasone, propranolol, Paliperidone ER, D3-50, triamcinolone, cetirizine, and QUEtiapine.  No orders of the defined types were placed in this encounter.   Problem List Items Addressed This Visit      Unprioritized   Amenorrhea - Primary    preg test neg May be due to new med from psych Will refer to gyn for  further eval       Relevant Orders   POCT urine pregnancy (Completed)   Ambulatory referral to Obstetrics / Gynecology   Insulin, random   CBC with Differential/Platelet   Lipid panel   Comprehensive metabolic panel   TSH   Ear pain, left    Exam normal and pain resolved rto prn      Morbid obesity (HCC)   Relevant Orders   Insulin, random   CBC with Differential/Platelet   Lipid panel   Comprehensive metabolic panel   TSH   Vitamin D deficiency   Relevant Orders   Vitamin D (25 hydroxy)      Follow-up: Return in about 6 months (around 04/05/2021), or if  symptoms worsen or fail to improve, for annual exam, fasting.  Ann Held, DO

## 2020-10-07 LAB — INSULIN, RANDOM: Insulin: 34.7 u[IU]/mL — ABNORMAL HIGH

## 2020-10-11 DIAGNOSIS — F209 Schizophrenia, unspecified: Secondary | ICD-10-CM | POA: Diagnosis not present

## 2020-10-19 ENCOUNTER — Other Ambulatory Visit (HOSPITAL_COMMUNITY): Payer: Self-pay | Admitting: Oral Surgery

## 2020-10-19 MED FILL — HYDROCODON-APAP 5-325: 5-325 | 2 days supply | Qty: 16 | Fill #0

## 2020-10-21 ENCOUNTER — Other Ambulatory Visit (HOSPITAL_COMMUNITY): Payer: Self-pay | Admitting: Specialist

## 2020-10-24 ENCOUNTER — Telehealth: Payer: Self-pay | Admitting: Neurology

## 2020-10-24 NOTE — Telephone Encounter (Signed)
Patient's mom called to report a seizure (2nd in her life) yesterday morning. The patient was last seen by Dr. Karel Jarvis 04/2018. She scheduled follow up with Dr. Karel Jarvis for 06/23/21 and has been added to the wait list.  Patient's mom will reach out to PCP to get patient seen as soon as possible.

## 2020-10-25 ENCOUNTER — Other Ambulatory Visit: Payer: Self-pay | Admitting: Family Medicine

## 2020-10-25 ENCOUNTER — Ambulatory Visit (INDEPENDENT_AMBULATORY_CARE_PROVIDER_SITE_OTHER): Payer: Medicare Other | Admitting: Family Medicine

## 2020-10-25 ENCOUNTER — Other Ambulatory Visit: Payer: Self-pay

## 2020-10-25 ENCOUNTER — Encounter: Payer: Self-pay | Admitting: Family Medicine

## 2020-10-25 VITALS — BP 100/60 | HR 114 | Temp 98.6°F | Resp 18 | Ht 64.0 in | Wt 219.4 lb

## 2020-10-25 DIAGNOSIS — G40909 Epilepsy, unspecified, not intractable, without status epilepticus: Secondary | ICD-10-CM | POA: Diagnosis not present

## 2020-10-25 DIAGNOSIS — J302 Other seasonal allergic rhinitis: Secondary | ICD-10-CM

## 2020-10-25 DIAGNOSIS — B370 Candidal stomatitis: Secondary | ICD-10-CM | POA: Diagnosis not present

## 2020-10-25 MED ORDER — NYSTATIN 100000 UNIT/ML MT SUSP: 5.0000 mL | Freq: Four times a day (QID) | OROMUCOSAL | 0 refills | Status: DC

## 2020-10-25 NOTE — Progress Notes (Signed)
Patient ID: [name], female    DOB: [birth year]  Age: 25 y.o. MRN: [id number]    Subjective:  Subjective  HPI [name] presents s/p seizure last Sunday AM.  It only the second one she has had.  She had her wisdom teeth removed on 3/9.  Sunday she said was home with her cousin and she went to the sink and started feeling stiff and foaming at the mouth and weakness and stiff body.  It lasted 10 min and she became lucid within minutes -- she bit her tongue and now has thrush.    Review of Systems  Constitutional: Negative for appetite change, diaphoresis, fatigue and unexpected weight change.  HENT: Positive for mouth sores.   Eyes: Negative for pain, redness and visual disturbance.  Respiratory: Negative for cough, chest tightness, shortness of breath and wheezing.   Cardiovascular: Negative for chest pain, palpitations and leg swelling.  Endocrine: Negative for cold intolerance, heat intolerance, polydipsia, polyphagia and polyuria.  Genitourinary: Negative for difficulty urinating, dysuria and frequency.  Neurological: Positive for seizures. Negative for dizziness, light-headedness, numbness and headaches.    History Past Medical History:  Diagnosis Date  . Acute vaginitis 05/20/2019  . ADD (attention deficit disorder) 04/24/2011  . ADHD (attention deficit hyperactivity disorder)   . Allergy   . Anxiety   . Benzodiazepine dependence (HCC) 05/12/2020  . Bipolar disorder (HCC)   . Bipolar disorder, current episode manic severe with psychotic features (HCC)   . Bipolar I disorder (HCC) 05/12/2020  . Constipation 04/03/2018  . Delirium tremens (HCC) 05/20/2019  . Depression   . Eczema 12/11/13  . Genital herpes simplex 10/24/2017  . High risk heterosexual behavior 05/20/2019  . MDD (major depressive disorder), recurrent severe, without psychosis (HCC) 02/11/2018  . Morbid obesity (HCC) 09/06/2019  . Pelvic pain 01/31/2018  . Possible pregnancy 09/06/2019  . Preventative health  care 03/16/2013  . Schizo-affective schizophrenia (HCC)   . Schizoaffective disorder (HCC) 03/08/2018  . Seizure (HCC) 05/20/2019  . Severe bipolar I disorder, current or most recent episode depressed (HCC) 03/30/2017  . STD exposure 05/13/2015  . Syncope 03/03/2017  . Vitamin D deficiency 03/10/2019    She has no past surgical history on file.   Her family history includes Asthma in her father and mother; COPD in her maternal grandmother; Diabetes in her maternal grandfather, paternal grandfather, and paternal grandmother; Heart disease in her father and maternal uncle; Hypertension in her father and maternal grandmother; Leukemia in her maternal uncle; Stroke in her father and paternal grandmother.She reports that she has never smoked. She has never used smokeless tobacco. She reports current alcohol use. She reports previous drug use.  Current Outpatient Medications on File Prior to Visit  Medication Sig Dispense Refill  . cetirizine (ZYRTEC) 10 MG tablet TAKE 1/2 TABLET (5 MG TOTAL) BY MOUTH DAILY. 100 tablet 1  . D3-50 1.25 MG (50000 UT) capsule Take 100,000 Units by mouth 2 (two) times a week.    Marland Kitchen LORazepam (ATIVAN) 1 MG tablet Take 1 tablet (1 mg total) by mouth every 6 (six) hours as needed for anxiety (agitation). 4 tablet 0  . Paliperidone ER (INVEGA SUSTENNA) injection Inject 117 mg into the muscle every 30 (thirty) days. EVERY 90 DAYS    . propranolol (INDERAL) 20 MG tablet Take 20 mg by mouth 3 (three) times daily.    . QUEtiapine (SEROQUEL XR) 400 MG 24 hr tablet     . triamcinolone (KENALOG) 0.1 %  No current facility-administered medications on file prior to visit.     Objective:  Objective  Physical Exam Vitals and nursing note reviewed.  Constitutional:      Appearance: She is well-developed.  HENT:     Head: Normocephalic and atraumatic.  Eyes:     Conjunctiva/sclera: Conjunctivae normal.  Neck:     Thyroid: No thyromegaly.     Vascular: No carotid bruit or JVD.   Cardiovascular:     Rate and Rhythm: Normal rate and regular rhythm.     Heart sounds: Normal heart sounds. No murmur heard.   Pulmonary:     Effort: Pulmonary effort is normal. No respiratory distress.     Breath sounds: Normal breath sounds. No wheezing or rales.  Chest:     Chest wall: No tenderness.  Musculoskeletal:     Cervical back: Normal range of motion and neck supple.  Neurological:     Mental Status: She is alert and oriented to person, place, and time.    BP 100/60 (BP Location: Right Arm, Patient Position: Sitting, Cuff Size: Large)   Pulse (!) 114   Temp 98.6 F (37 C) (Oral)   Resp 18   Ht 5\' 4"  (1.626 m)   Wt 219 lb 6.4 oz (99.5 kg)   SpO2 97%   BMI 37.66 kg/m  Wt Readings from Last 3 Encounters:  10/25/20 219 lb 6.4 oz (99.5 kg)  10/06/20 223 lb 6.4 oz (101.3 kg)  08/31/20 215 lb (97.5 kg)     Lab Results  Component Value Date   WBC 5.6 10/06/2020   HGB 13.1 10/06/2020   HCT 39.1 10/06/2020   PLT 293.0 10/06/2020   GLUCOSE 93 10/06/2020   CHOL 196 10/06/2020   TRIG 100.0 10/06/2020   HDL 54.30 10/06/2020   LDLCALC 121 (H) 10/06/2020   ALT 15 10/06/2020   AST 13 10/06/2020   NA 137 10/06/2020   K 4.6 10/06/2020   CL 103 10/06/2020   CREATININE 0.78 10/06/2020   BUN 17 10/06/2020   CO2 28 10/06/2020   TSH 1.33 10/06/2020   HGBA1C 5.0 07/02/2020   MICROALBUR 0.6 08/02/2014    No results found.   Assessment & Plan:  Plan  I am having 08/04/2014 start on nystatin. I am also having her maintain her LORazepam, propranolol, Paliperidone ER, D3-50, triamcinolone, cetirizine, and QUEtiapine.  Meds ordered this encounter  Medications  . nystatin (MYCOSTATIN) 100000 UNIT/ML suspension    Sig: Take 5 mLs (500,000 Units total) by mouth 4 (four) times daily.    Dispense:  60 mL    Refill:  0    Problem List Items Addressed This Visit      Unprioritized   Seizure disorder (HCC) - Primary    ? If it could be from [name]-- pt and  her mom will discuss with psych and we will also get pt back in with neuro Pt also had anesthesia last week for wisdom teeth removal       Relevant Orders   Ambulatory referral to Neurology   Thrush   Relevant Medications   nystatin (MYCOSTATIN) 100000 UNIT/ML suspension     Follow-up: Return if symptoms worsen or fail to improve.  [place], DO

## 2020-10-25 NOTE — Patient Instructions (Signed)
Seizure, Adult A seizure is a sudden burst of abnormal electrical and chemical activity in the brain. Seizures usually last from 30 seconds to 2 minutes. The abnormal activity temporarily interrupts normal brain function. Many types of seizures can affect adults. A seizure can cause many different symptoms depending on where in the brain it starts. What are the causes? Common causes of this condition include:  Fever or infection.  Brain injury, head trauma, bleeding in the brain, or a brain tumor.  Low levels of blood sugar or salt (sodium).  Kidney problems or liver problems.  Metabolic disorders or other conditions that are passed from parent to child (are inherited).  Reaction to a substance, such as a drug or a medicine, or suddenly stopping the use of a substance (withdrawal).  A stroke.  Developmental disorders such as autism spectrum disorder or cerebral palsy. In some cases, the cause of a seizure may not be known. Some people who have a seizure never have another one. A person who has repeated seizures over time without a clear cause has a condition called epilepsy. What increases the risk? You are more likely to develop this condition if:  You have a family history of epilepsy.  You have had a tonic-clonic seizure before. This type of seizure causes tightening (contraction) of the muscles of the whole body and loss of consciousness.  You have a history of head trauma, lack of oxygen at birth, or strokes. What are the signs or symptoms? There are many different types of seizures. The symptoms vary depending on the type of seizure you have. Symptoms occur during the seizure. They may also occur before a seizure (aura) and after a seizure (postictal). Symptoms may include the following: Symptoms during a seizure  Uncontrollable shaking (convulsions) with fast, jerky movements of muscles.  Stiffening of the body.  Breathing problems.  Confusion, staring, or  unresponsiveness.  Head nodding, eye blinking or fluttering, or rapid eye movements.  Drooling, grunting, or making clicking sounds with your mouth.  Loss of bladder control and bowel control. Symptoms before a seizure  Fear or anxiety.  Nausea.  Vertigo. This is a feeling like: ? You are moving when you are not. ? Your surroundings are moving when they are not.  Dj vu. This is a feeling of having seen or heard something before.  Odd tastes or smells.  Changes in vision, such as seeing flashing lights or spots. Symptoms after a seizure  Confusion.  Sleepiness.  Headache.  Sore muscles. How is this diagnosed? This condition may be diagnosed based on:  A description of your symptoms. Video of your seizures can be helpful.  Your medical history.  A physical exam. You may also have tests, including:  Blood tests.  CT scan.  MRI.  Electroencephalogram (EEG). This test measures electrical activity in the brain. An EEG can predict whether seizures will return.  A spinal tap, also called a lumbar puncture. This is the removal and testing of fluid that surrounds the brain and spinal cord. How is this treated? Most seizures will stop on their own in less than 5 minutes, and no treatment is needed. Seizures that last longer than 5 minutes will usually need treatment. Seizures may be treated with:  Medicines given through an IV.  Avoiding known triggers, such as medicines that you take for another condition.  Medicines to control seizures or prevent future seizures (antiepileptics), if epilepsy caused your seizures.  Medical devices to prevent and control seizures.  Surgery   to stop seizures or to reduce how often seizures happen, if you have epilepsy that does not respond to medicines.  A diet low in carbohydrates and high in fat (ketogenic diet). Follow these instructions at home: Medicines  Take over-the-counter and prescription medicines only as told by  your health care provider.  Avoid any substances that may prevent your medicine from working properly, such as alcohol. Activity  Follow instructions about activities, such as driving or swimming, that would be dangerous if you had another seizure. Wait until your health care provider says it is safe to do them.  If you live in the [place] check with your local department of motor vehicles [facility]) to find out about local driving laws. Each state has specific rules about when you can legally drive again.  Get enough rest. Lack of sleep can make seizures more likely to occur. Educating others  Teach friends and family what to do if you have a seizure. They should: ? Help you get down to the ground, to prevent a fall. ? Cushion your head and move items away from your body. ? Loosen any tight clothing around your neck. ? Turn you on your side. If you vomit, this helps keep your airway clear. ? Know whether or not you need emergency care. ? Stay with you until you recover.  Also, tell them what not to do if you have a seizure. Tell them: ? They should not hold you down. Holding you down will not stop the seizure. ? They should not put anything in your mouth.   General instructions  Avoid anything that has ever triggered a seizure for you.  Keep a seizure diary. Record what you remember about each seizure, especially anything that might have triggered it.  Keep all follow-up visits. This is important. Contact a health care provider if:  You have another seizure or seizures. Call each time you have a seizure.  Your seizure pattern changes.  You continue to have seizures with treatment.  You have symptoms of an infection or illness. Either of these might increase your risk of having a seizure.  You are unable to take your medicine. Get help right away if:  You have: ? A seizure that does not stop after 5 minutes. ? Several seizures in a row without a complete recovery between  seizures. ? A seizure that makes it harder to breathe. ? A seizure that leaves you unable to speak or use a part of your body.  You do not wake up right away after a seizure.  You injure yourself during a seizure.  You have confusion or pain right after a seizure. These symptoms may represent a serious problem that is an emergency. Do not wait to see if the symptoms will go away. Get medical help right away. Call your local emergency services (911 in the [place] Do not drive yourself to the hospital. Summary  Seizures are caused by abnormal electrical and chemical activity in the brain. The activity disrupts normal brain function and can cause various symptoms.  Seizures have many causes, including illness, head injuries, low levels of blood sugar or salt, and certain conditions.  Most seizures will stop on their own in less than 5 minutes. Seizures that last longer than 5 minutes are a medical emergency and need treatment right away.  Many medicines are used to treat seizures. Take over-the-counter and prescription medicines only as told by your health care provider. This information is not intended to replace advice  given to you by your health care provider. Make sure you discuss any questions you have with your health care provider. Document Revised: 02/05/2020 Document Reviewed: 02/05/2020 Elsevier Patient Education  2021 [employer]  

## 2020-10-25 NOTE — Telephone Encounter (Signed)
Scheduled for 3/22 at 11:30am.

## 2020-10-25 NOTE — Assessment & Plan Note (Signed)
?   If it could be from [place]-- pt and her mom will discuss with psych and we will also get pt back in with neuro Pt also had anesthesia last week for wisdom teeth removal

## 2020-11-01 ENCOUNTER — Encounter: Payer: Self-pay | Admitting: Neurology

## 2020-11-01 ENCOUNTER — Other Ambulatory Visit: Payer: Self-pay

## 2020-11-01 ENCOUNTER — Ambulatory Visit (INDEPENDENT_AMBULATORY_CARE_PROVIDER_SITE_OTHER): Payer: 59 | Admitting: Neurology

## 2020-11-01 VITALS — BP 128/85 | HR 102 | Resp 20 | Ht 64.0 in | Wt 222.0 lb

## 2020-11-01 DIAGNOSIS — R443 Hallucinations, unspecified: Secondary | ICD-10-CM

## 2020-11-01 DIAGNOSIS — R569 Unspecified convulsions: Secondary | ICD-10-CM | POA: Diagnosis not present

## 2020-11-01 DIAGNOSIS — G40909 Epilepsy, unspecified, not intractable, without status epilepticus: Secondary | ICD-10-CM

## 2020-11-01 NOTE — Patient Instructions (Addendum)
1. Schedule 30-minute EEG, then we will do a 48-hour EEG  2. Continue all your medications, do not stop medications suddenly  3. Follow-up after tests, call for ay changes   Seizure Precautions: 1. If medication has been prescribed for you to prevent seizures, take it exactly as directed.  Do not stop taking the medicine without talking to your doctor first, even if you have not had a seizure in a long time.   2. Avoid activities in which a seizure would cause danger to yourself or to others.  Don't operate dangerous machinery, swim alone, or climb in high or dangerous places, such as on ladders, roofs, or girders.  Do not drive unless your doctor says you may.  3. If you have any warning that you may have a seizure, lay down in a safe place where you can't hurt yourself.    4.  No driving for 6 months from last seizure, as per [facility].   Please refer to the following link on the [employer]'s website for more information: http://www.epilepsyfoundation.org/answerplace/Social/driving/drivingu.cfm   5.  Maintain good sleep hygiene. Avoid alcohol   6.  Notify your neurology if you are planning pregnancy or if you become pregnant.  7.  Contact your doctor if you have any problems that may be related to the medicine you are taking.  8.  Call 911 and bring the patient back to the ED if:        A.  The seizure lasts longer than 5 minutes.       B.  The patient doesn't awaken shortly after the seizure  C.  The patient has new problems such as difficulty seeing, speaking or moving  D.  The patient was injured during the seizure  E.  The patient has a temperature over 102 F (39C)  F.  The patient vomited and now is having trouble breathing
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
## 2020-11-03 DIAGNOSIS — H5213 Myopia, bilateral: Secondary | ICD-10-CM | POA: Diagnosis not present

## 2020-11-08 MED FILL — LORAZEPAM 1 MG TABS: 1 | 30 days supply | Qty: 120 | Fill #0

## 2020-11-15 ENCOUNTER — Ambulatory Visit (INDEPENDENT_AMBULATORY_CARE_PROVIDER_SITE_OTHER): Payer: 59 | Admitting: Neurology

## 2020-11-15 ENCOUNTER — Telehealth: Payer: Self-pay

## 2020-11-15 ENCOUNTER — Other Ambulatory Visit: Payer: Self-pay

## 2020-11-15 DIAGNOSIS — R443 Hallucinations, unspecified: Secondary | ICD-10-CM

## 2020-11-15 DIAGNOSIS — R569 Unspecified convulsions: Secondary | ICD-10-CM | POA: Diagnosis not present

## 2020-11-15 NOTE — Procedures (Signed)
ELECTROENCEPHALOGRAM REPORT  Date of Study: 11/15/2020  Patient's Name: [name] MRN: [id number] Date of Birth: 1996/06/03  Referring Provider: Dr. Patrcia Dolly  Clinical History: This is a 25 year old woman with 2 convulsions, most recently in 10/2020, with auditory hallucinations. EEG for classification.  Medications: Ativan Invega Propranolol Seroquel  Technical Summary: A multichannel digital EEG recording measured by the international 10-20 system with electrodes applied with paste and impedances below 5000 ohms performed in our laboratory with EKG monitoring in an awake and drowsy patient.  Hyperventilation was not performed. Photic stimulation was performed.  The digital EEG was referentially recorded, reformatted, and digitally filtered in a variety of bipolar and referential montages for optimal display.    Description: The patient is awake and drowsy during the recording.  During maximal wakefulness, there is a symmetric, medium voltage 10 Hz posterior dominant rhythm that attenuates with eye opening.  The record is symmetric.  During drowsiness, there is an increase in theta slowing of the background.  Sleep was not captured. Photic stimulation did not elicit any abnormalities.  There were no epileptiform discharges or electrographic seizures seen.    EKG lead was unremarkable.  Impression: This awake and drowsy EEG is normal.    Clinical Correlation: A normal EEG does not exclude a clinical diagnosis of epilepsy.  If further clinical questions remain, prolonged EEG may be helpful.  Clinical correlation is advised.   Patrcia Dolly, M.D.

## 2020-11-15 NOTE — Telephone Encounter (Signed)
-----   Message from Karen M Aquino, MD sent at 11/15/2020  2:49 PM EDT ----- Pls let patient/mom know the EEG is normal, proceed with prolonged EEG as scheduled, thanks 

## 2020-11-15 NOTE — Telephone Encounter (Signed)
Pt called no answer left a voice mail to call the office back  °

## 2020-11-16 ENCOUNTER — Telehealth: Payer: Self-pay

## 2020-11-16 ENCOUNTER — Telehealth: Payer: Self-pay | Admitting: Neurology

## 2020-11-16 ENCOUNTER — Other Ambulatory Visit: Payer: 59

## 2020-11-16 NOTE — Telephone Encounter (Signed)
See result notes. 

## 2020-11-16 NOTE — Telephone Encounter (Signed)
Patient's mom returned call for EEG results.

## 2020-11-16 NOTE — Telephone Encounter (Signed)
Spoke with pt mother informed her that EEG is normal, proceed with prolonged EEG as scheduled

## 2020-11-16 NOTE — Telephone Encounter (Signed)
-----   Message from Van Clines, MD sent at 11/15/2020  2:49 PM EDT ----- Pls let patient/mom know the EEG is normal, proceed with prolonged EEG as scheduled, thanks

## 2020-11-28 ENCOUNTER — Other Ambulatory Visit: Payer: Self-pay

## 2020-11-28 ENCOUNTER — Ambulatory Visit (INDEPENDENT_AMBULATORY_CARE_PROVIDER_SITE_OTHER): Payer: 59 | Admitting: Neurology

## 2020-11-28 DIAGNOSIS — R443 Hallucinations, unspecified: Secondary | ICD-10-CM

## 2020-11-28 DIAGNOSIS — R569 Unspecified convulsions: Secondary | ICD-10-CM | POA: Diagnosis not present

## 2020-11-29 ENCOUNTER — Other Ambulatory Visit: Payer: Self-pay | Admitting: Family Medicine

## 2020-11-29 ENCOUNTER — Other Ambulatory Visit (HOSPITAL_BASED_OUTPATIENT_CLINIC_OR_DEPARTMENT_OTHER): Payer: Self-pay

## 2020-11-29 ENCOUNTER — Other Ambulatory Visit: Payer: Self-pay

## 2020-11-29 DIAGNOSIS — J302 Other seasonal allergic rhinitis: Secondary | ICD-10-CM

## 2020-11-29 MED ORDER — CETIRIZINE HCL 10 MG PO TABS
5.0000 mg | ORAL_TABLET | Freq: Every day | ORAL | 1 refills | Status: DC
  Filled 2020-11-29: qty 100, 200d supply, fill #0

## 2020-11-30 ENCOUNTER — Ambulatory Visit: Payer: Medicare Other | Attending: Internal Medicine

## 2020-11-30 ENCOUNTER — Ambulatory Visit (INDEPENDENT_AMBULATORY_CARE_PROVIDER_SITE_OTHER): Payer: 59 | Admitting: Obstetrics & Gynecology

## 2020-11-30 ENCOUNTER — Other Ambulatory Visit: Payer: Self-pay

## 2020-11-30 ENCOUNTER — Other Ambulatory Visit (HOSPITAL_BASED_OUTPATIENT_CLINIC_OR_DEPARTMENT_OTHER): Payer: Self-pay

## 2020-11-30 ENCOUNTER — Encounter: Payer: Self-pay | Admitting: Obstetrics & Gynecology

## 2020-11-30 VITALS — BP 111/77 | HR 88 | Ht 64.0 in | Wt 216.0 lb

## 2020-11-30 DIAGNOSIS — N912 Amenorrhea, unspecified: Secondary | ICD-10-CM

## 2020-11-30 DIAGNOSIS — Z23 Encounter for immunization: Secondary | ICD-10-CM

## 2020-11-30 LAB — POCT URINE PREGNANCY: Preg Test, Ur: NEGATIVE

## 2020-11-30 MED ORDER — PROPRANOLOL HCL 20 MG PO TABS
ORAL_TABLET | ORAL | 2 refills | Status: DC
  Filled 2020-11-30: qty 90, 30d supply, fill #0
  Filled 2021-01-11: qty 90, 30d supply, fill #1
  Filled 2021-02-27 (×2): qty 90, 30d supply, fill #2

## 2020-11-30 MED FILL — Quetiapine Fumarate Tab ER 24HR 400 MG: ORAL | 30 days supply | Qty: 60 | Fill #0 | Status: AC

## 2020-11-30 NOTE — Progress Notes (Signed)
   Covid-19 Vaccination Clinic  Name:  [name]    MRN: [id number] DOB: [birth year]  11/30/2020  [name] was observed post Covid-19 immunization for 15 minutes without incident. She was provided with Vaccine Information Sheet and instruction to access the V-Safe system.   [name] was instructed to call 911 with any severe reactions post vaccine: Marland Kitchen Difficulty breathing  . Swelling of face and throat  . A fast heartbeat  . A bad rash all over body  . Dizziness and weakness   Immunizations Administered    Name Date Dose VIS Date Route   PFIZER Comrnaty(Gray TOP) Covid-19 Vaccine 11/30/2020  2:37 PM 0.3 mL 07/21/2020 Intramuscular   Manufacturer: [employer], [employer]   Lot: [id number]   NDC: [phone]

## 2020-11-30 NOTE — Progress Notes (Signed)
History:  25 y.o. G0 who is not sexually active due to plans for abstinence prior to marriage. Pt resents today for reports of amenorrhea. Pt is on antipsychotics and her mother, who is her legal guardian, reports that she is doing well on the meds. Pt is getting a shot of Invega every 3 months. It was prev monthly. Since starting it, her cycles have stopped. Pt denies other GYN sx.   The following portions of the patient's history were reviewed and updated as appropriate: allergies, current medications, past family history, past medical history, past social history, past surgical history and problem list.  Review of Systems:  Pertinent items are noted in HPI.    Objective:  Physical Exam Blood pressure 111/77, pulse 88, height 5\' 4"  (1.626 m), weight 216 lb (98 kg), last menstrual period 07/20/2020.  CONSTITUTIONAL: Well-developed, well-nourished female in no acute distress.  HENT:  Normocephalic, atraumatic EYES: Conjunctivae and EOM are normal. No scleral icterus.  NECK: Normal range of motion SKIN: Skin is warm and dry. No rash noted. Not diaphoretic.No pallor. NEUROLGIC: Alert and oriented to person, place, and time. Normal coordination.  Pelvic: deferred  UPT neg  Assessment & Plan:  Amenorrhea.   Reviewed causes of amenorrhea. Initially pt reports exam this year wit Dr. 14/03/2020 so exam was not done. On review of chart, pts last PAP was 10/2016. Pt will f/u in 3 months for Annual with PAP.   Total face-to-face time with patient, review of chart, discussion with consultant and coordination of care was 11/2016.  We discussed causes of amenorrhea and possible effects of the 3 month vs monthly injection.   Also reviewed recommended screening for well women care.   Safiyya Stokes L. Harraway-Smith, M.D., 

## 2020-12-02 ENCOUNTER — Other Ambulatory Visit (HOSPITAL_BASED_OUTPATIENT_CLINIC_OR_DEPARTMENT_OTHER): Payer: Self-pay

## 2020-12-02 MED ORDER — PFIZER-BIONT COVID-19 VAC-TRIS 30 MCG/0.3ML IM SUSP
INTRAMUSCULAR | 0 refills | Status: DC
  Filled 2020-12-02: qty 0.3, 1d supply, fill #0

## 2020-12-07 ENCOUNTER — Other Ambulatory Visit (HOSPITAL_BASED_OUTPATIENT_CLINIC_OR_DEPARTMENT_OTHER): Payer: Self-pay

## 2020-12-08 ENCOUNTER — Other Ambulatory Visit (HOSPITAL_BASED_OUTPATIENT_CLINIC_OR_DEPARTMENT_OTHER): Payer: Self-pay

## 2020-12-08 DIAGNOSIS — F209 Schizophrenia, unspecified: Secondary | ICD-10-CM | POA: Diagnosis not present

## 2020-12-15 ENCOUNTER — Encounter: Payer: Self-pay | Admitting: General Practice

## 2020-12-16 ENCOUNTER — Other Ambulatory Visit (HOSPITAL_BASED_OUTPATIENT_CLINIC_OR_DEPARTMENT_OTHER): Payer: Self-pay

## 2020-12-16 MED FILL — Fluticasone Propionate Nasal Susp 50 MCG/ACT: NASAL | 30 days supply | Qty: 16 | Fill #0 | Status: AC

## 2020-12-27 ENCOUNTER — Other Ambulatory Visit (HOSPITAL_BASED_OUTPATIENT_CLINIC_OR_DEPARTMENT_OTHER): Payer: Self-pay

## 2020-12-27 DIAGNOSIS — F209 Schizophrenia, unspecified: Secondary | ICD-10-CM | POA: Diagnosis not present

## 2020-12-27 MED ORDER — INVEGA TRINZA 819 MG/2.63ML IM SUSY
PREFILLED_SYRINGE | INTRAMUSCULAR | 3 refills | Status: DC
  Filled 2020-12-27: qty 1, 90d supply, fill #0
  Filled 2021-04-04: qty 2.63, 90d supply, fill #1
  Filled 2021-09-19: qty 0.37, 84d supply, fill #0

## 2020-12-28 ENCOUNTER — Other Ambulatory Visit (HOSPITAL_COMMUNITY): Payer: Self-pay

## 2020-12-28 ENCOUNTER — Other Ambulatory Visit (HOSPITAL_BASED_OUTPATIENT_CLINIC_OR_DEPARTMENT_OTHER): Payer: Self-pay

## 2020-12-28 DIAGNOSIS — F4323 Adjustment disorder with mixed anxiety and depressed mood: Secondary | ICD-10-CM | POA: Diagnosis not present

## 2020-12-28 MED FILL — Quetiapine Fumarate Tab ER 24HR 400 MG: ORAL | 30 days supply | Qty: 60 | Fill #1 | Status: AC

## 2021-01-06 ENCOUNTER — Other Ambulatory Visit: Payer: Self-pay

## 2021-01-06 ENCOUNTER — Encounter: Payer: Self-pay | Admitting: Neurology

## 2021-01-06 ENCOUNTER — Ambulatory Visit (INDEPENDENT_AMBULATORY_CARE_PROVIDER_SITE_OTHER): Payer: 59 | Admitting: Neurology

## 2021-01-06 VITALS — BP 120/59 | HR 91 | Ht 64.0 in | Wt 222.0 lb

## 2021-01-06 DIAGNOSIS — R569 Unspecified convulsions: Secondary | ICD-10-CM

## 2021-01-06 DIAGNOSIS — R443 Hallucinations, unspecified: Secondary | ICD-10-CM | POA: Diagnosis not present

## 2021-01-06 NOTE — Patient Instructions (Addendum)
1. Schedule MRI brain with and without contrast  2. Continue with follow-up with Dr. [name] as scheduled. Do NOT stop your medications suddenly.  3. Follow-up in 6 months, call for any changes
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
## 2021-01-11 ENCOUNTER — Other Ambulatory Visit (HOSPITAL_COMMUNITY): Payer: Self-pay

## 2021-01-11 MED FILL — Lorazepam Tab 1 MG: ORAL | 30 days supply | Qty: 120 | Fill #0 | Status: AC

## 2021-01-18 DIAGNOSIS — F4323 Adjustment disorder with mixed anxiety and depressed mood: Secondary | ICD-10-CM | POA: Diagnosis not present

## 2021-01-23 NOTE — Procedures (Signed)
ELECTROENCEPHALOGRAM REPORT  Dates of Recording: 11/28/2020 8:42AM to 11/30/2020 8:52AM  Patient's Name: [name] MRN: [id number] Date of Birth: [birth year]  Referring Provider: Dr. Patrcia Dolly  Procedure: 48-hour ambulatory video EEG  History: This is a 25 year old woman with shaking episodes, convulsion (possibly benzodiazepine withdrawal), and personality/behavioral changes, delusions, auditory hallucinations. EEG for classification.  Medications:  Lorazepam Paliperidone ER Propranolol Quetiapine   Technical Summary: This is a 48-hour multichannel digital video EEG recording measured by the international 10-20 system with electrodes applied with paste and impedances below 5000 ohms performed as portable with EKG monitoring.  The digital EEG was referentially recorded, reformatted, and digitally filtered in a variety of bipolar and referential montages for optimal display.    DESCRIPTION OF RECORDING: During maximal wakefulness, the background activity consisted of a symmetric 9 Hz posterior dominant rhythm which was reactive to eye opening.  There is occasional 4-5 Hz theta slowing seen over the bilateral temporal regions, left more than right. There were no epileptiform discharges seen in wakefulness.  During the recording, the patient progresses through wakefulness, drowsiness, and Stage 2 sleep. Similar occasional independent focal theta slowing is seen over the bilateral temporal regions, left more than right. Again, there were no epileptiform discharges seen.  Events: There were numerous push button events, up to 12 in one hour, where she reports "a person said something mind reading." She is seen sitting with no clinical changes seen on video. Electrographically, there were no EEG or EKG changes seen.  On 4/20 at 0801 hours, she reports the nerves on her bottom lip have involuntary movement. Patient not on video. Electrographically, there were no EEG or EKG changes  seen.  There were no electrographic seizures seen.  EKG lead was unremarkable. There was EKG artifact throughout the recording.   IMPRESSION: This 48-hour ambulatory video EEG study is abnormal due to the the presence of occasional focal slowing over the bilateral temporal regions, left greater than right. No epileptiform discharges or electrographic seizures seen.  CLINICAL CORRELATION of the above findings indicates focal cerebral dysfunction over the bilateral temporal regions suggestive of underlying structural or physiologic abnormality. The absence of epileptiform discharges does not exclude a clinical diagnosis of epilepsy. Numerous push button events for auditory hallucinations did not show any clear electrographic correlate. If further clinical questions remain, inpatient video EEG monitoring may be helpful.   Patrcia Dolly, M.D.

## 2021-01-27 ENCOUNTER — Ambulatory Visit
Admission: RE | Admit: 2021-01-27 | Discharge: 2021-01-27 | Disposition: A | Discharge status: Home / Self Care | Payer: 59 | Source: Ambulatory Visit | Attending: Neurology | Admitting: Neurology

## 2021-01-27 ENCOUNTER — Other Ambulatory Visit: Payer: Self-pay

## 2021-01-27 DIAGNOSIS — R569 Unspecified convulsions: Secondary | ICD-10-CM

## 2021-01-27 DIAGNOSIS — R443 Hallucinations, unspecified: Secondary | ICD-10-CM

## 2021-01-27 MED ORDER — GADOBENATE DIMEGLUMINE 529 MG/ML IV SOLN
20.0000 mL | Freq: Once | INTRAVENOUS | Status: AC | PRN
  Administered 2021-01-27: 20 mL via INTRAVENOUS

## 2021-01-30 NOTE — Progress Notes (Signed)
Left message

## 2021-02-07 ENCOUNTER — Other Ambulatory Visit (HOSPITAL_COMMUNITY): Payer: Self-pay

## 2021-02-07 MED FILL — Cholecalciferol Cap 1.25 MG (50000 Unit): ORAL | 84 days supply | Qty: 24 | Fill #0 | Status: AC

## 2021-02-10 ENCOUNTER — Other Ambulatory Visit (HOSPITAL_COMMUNITY): Payer: Self-pay

## 2021-02-10 ENCOUNTER — Other Ambulatory Visit: Payer: Self-pay | Admitting: Family Medicine

## 2021-02-10 DIAGNOSIS — J302 Other seasonal allergic rhinitis: Secondary | ICD-10-CM

## 2021-02-14 ENCOUNTER — Other Ambulatory Visit (HOSPITAL_COMMUNITY): Payer: Self-pay

## 2021-02-14 MED ORDER — FLUTICASONE PROPIONATE 50 MCG/ACT NA SUSP
NASAL | 1 refills | Status: DC
  Filled 2021-02-14 – 2021-02-27 (×2): qty 16, 30d supply, fill #0
  Filled 2021-04-04: qty 16, 30d supply, fill #1

## 2021-02-22 ENCOUNTER — Other Ambulatory Visit (HOSPITAL_COMMUNITY): Payer: Self-pay

## 2021-02-27 ENCOUNTER — Other Ambulatory Visit (HOSPITAL_COMMUNITY): Payer: Self-pay

## 2021-02-27 ENCOUNTER — Other Ambulatory Visit (HOSPITAL_BASED_OUTPATIENT_CLINIC_OR_DEPARTMENT_OTHER): Payer: Self-pay

## 2021-02-27 MED FILL — Quetiapine Fumarate Tab ER 24HR 400 MG: ORAL | 30 days supply | Qty: 60 | Fill #2 | Status: AC

## 2021-02-27 MED FILL — Quetiapine Fumarate Tab ER 24HR 400 MG: ORAL | 30 days supply | Qty: 60 | Fill #2 | Status: CN

## 2021-02-28 ENCOUNTER — Other Ambulatory Visit (HOSPITAL_BASED_OUTPATIENT_CLINIC_OR_DEPARTMENT_OTHER): Payer: Self-pay

## 2021-03-06 ENCOUNTER — Other Ambulatory Visit (HOSPITAL_COMMUNITY): Payer: Self-pay

## 2021-04-04 ENCOUNTER — Other Ambulatory Visit (HOSPITAL_BASED_OUTPATIENT_CLINIC_OR_DEPARTMENT_OTHER): Payer: Self-pay

## 2021-04-04 MED FILL — Quetiapine Fumarate Tab ER 24HR 400 MG: ORAL | 30 days supply | Qty: 60 | Fill #3 | Status: AC

## 2021-04-05 ENCOUNTER — Other Ambulatory Visit (HOSPITAL_BASED_OUTPATIENT_CLINIC_OR_DEPARTMENT_OTHER): Payer: Self-pay

## 2021-04-05 MED ORDER — PROPRANOLOL HCL 20 MG PO TABS
ORAL_TABLET | ORAL | 0 refills | Status: DC
  Filled 2021-04-05: qty 90, 30d supply, fill #0

## 2021-04-14 ENCOUNTER — Other Ambulatory Visit (HOSPITAL_BASED_OUTPATIENT_CLINIC_OR_DEPARTMENT_OTHER): Payer: Self-pay

## 2021-04-18 ENCOUNTER — Encounter: Payer: Self-pay | Admitting: Family Medicine

## 2021-04-18 ENCOUNTER — Other Ambulatory Visit (HOSPITAL_BASED_OUTPATIENT_CLINIC_OR_DEPARTMENT_OTHER): Payer: Self-pay

## 2021-04-18 ENCOUNTER — Other Ambulatory Visit: Payer: Self-pay

## 2021-04-18 ENCOUNTER — Ambulatory Visit (INDEPENDENT_AMBULATORY_CARE_PROVIDER_SITE_OTHER): Payer: 59 | Admitting: Family Medicine

## 2021-04-18 VITALS — BP 120/70 | HR 111 | Temp 98.0°F | Resp 18 | Ht 64.0 in | Wt 236.2 lb

## 2021-04-18 DIAGNOSIS — T7840XA Allergy, unspecified, initial encounter: Secondary | ICD-10-CM

## 2021-04-18 DIAGNOSIS — J40 Bronchitis, not specified as acute or chronic: Secondary | ICD-10-CM | POA: Diagnosis not present

## 2021-04-18 LAB — POC INFLUENZA A&B (BINAX/QUICKVUE)
Influenza A, POC: NEGATIVE
Influenza B, POC: NEGATIVE

## 2021-04-18 MED ORDER — PROMETHAZINE-DM 6.25-15 MG/5ML PO SYRP
5.0000 mL | ORAL_SOLUTION | Freq: Four times a day (QID) | ORAL | 0 refills | Status: DC | PRN
  Filled 2021-04-18: qty 118, 6d supply, fill #0

## 2021-04-18 MED ORDER — MOMETASONE FUROATE 50 MCG/ACT NA SUSP
2.0000 | Freq: Every day | NASAL | 12 refills | Status: DC
  Filled 2021-04-18 – 2021-10-19 (×4): qty 17, 30d supply, fill #0

## 2021-04-18 NOTE — Assessment & Plan Note (Signed)
nasonex and zyrtec rto prn

## 2021-04-18 NOTE — Assessment & Plan Note (Signed)
Most likely viral -- symptoms improving  Flu neg covid pending  Cough med per orders

## 2021-04-18 NOTE — Patient Instructions (Signed)
Chronic Bronchitis, Adult Chronic bronchitis is inflammation of the large airways (bronchial tubes) that carry air to the lungs. The inflammation causes more mucus (sputum) to build up. The inflammation and mucus make it hard to breathe. This condition is a type of chronic obstructive pulmonary disease (COPD). Chronic bronchitis is a long-term (chronic) condition. It is defined as a chronic cough with sputum production: For at least 3 months of the year. For 2 years in a row. People with chronic bronchitis are more likely to get colds and other infections in the nose, throat, or airways. What are the causes? This condition is most often caused by: A history of smoking. Exposure to secondhand smoke or a smoky area for a long period of time. Lung problems, such as emphysema, asthma, bronchiectasis, or cystic fibrosis. Long-term exposure to certain fumes or chemicals that irritate the lungs. Infection. What are the signs or symptoms? Symptoms of chronic bronchitis may include: A cough that brings up mucus (productive cough). Shortness of breath. A whistling sound when you breathe (wheezing). Chest tightness. Colds or respiratory infections that go away and return. How is this diagnosed? This condition may be diagnosed based on: Your symptoms and medical history. A physical exam. Tests, such as: Testing a sputum sample. Blood tests. A chest X-ray. Tests of lung (pulmonary) function. How is this treated? There is no cure for chronic bronchitis. Treatment may help control your symptoms. This includes: Drinking fluids. This may help thin your mucus so it is easier to cough up. Mucus-clearing techniques. Taking medicine prescribed by your health care provider, such as: Inhaled medicine to improve airflow in and out of your lungs. Antibiotics to treat or prevent bacterial lung infections. Using oxygen therapy, if your blood oxygen level is very low. Pulmonary rehabilitation. This is a  program that helps you learn how to manage your breathing problem. The program may include exercise, education, counseling, treatment, and support. Follow these instructions at home: Medicines Take over-the-counter and prescription medicines only as told by your health care provider. If you were prescribed an antibiotic medicine, take it as told by your health care provider. Do not stop taking the antibiotic even if you start to feel better. Lifestyle  Do not use any products that contain nicotine or tobacco, such as cigarettes, e-cigarettes, and chewing tobacco. If you need help quitting, ask your health care provider. Stay away from other people's smoke (secondhand smoke) and any irritants that make you cough more, such as chemical fumes. Eat a healthy diet and get regular exercise. Talk with your health care provider about what activities are safe for you. Preventing infections Stay up to date on all immunizations, including the pneumonia and flu vaccines. Wash your hands often with soap and water for at least 20 seconds. If soap and water are not available, use hand sanitizer. Avoid contact with people who have symptoms of a cold or the flu. General instructions Drink enough fluids to keep your urine pale yellow. Use oxygen therapy at home as directed. Follow instructions from your health care provider about how to use oxygen safely and take steps to prevent fire. Do not smoke while using oxygen or allow others to smoke in your home. Keep all follow-up visits as told by your health care provider. This is important. Contact a health care provider if: Your shortness of breath or coughing gets worse even when you take medicine. Your mucus gets thicker or changes color. You are not able to cough up your mucus. You have  a fever. Get help right away if: You have trouble breathing. You have chest pain. You feel dizzy or confused. These symptoms may represent a serious problem that is an  emergency. Do not wait to see if the symptoms will go away. Get medical help right away. Call your local emergency services (911 in the [place] Do not drive yourself to the hospital. Summary Chronic bronchitis is inflammation of the large airways (bronchial tubes) that carry air to the lungs. The inflammation causes mucus (sputum) to build up. The inflammation and mucus make it hard to breathe. If you were prescribed an antibiotic medicine, take it as told by your health care provider. Do not stop taking the antibiotic even if you start to feel better. Drink enough fluids to keep your urine pale yellow. Drinking fluids may help thin your mucus so it is easier to cough up. Do not use any products that contain nicotine or tobacco, such as cigarettes, e-cigarettes, and chewing tobacco. If you need help quitting, ask your health care provider. This information is not intended to replace advice given to you by your health care provider. Make sure you discuss any questions you have with your health care provider. Document Revised: 09/14/2019 Document Reviewed: 09/15/2019 Elsevier Patient Education  2022 [employer].

## 2021-04-18 NOTE — Progress Notes (Signed)
Subjective:   By signing my name below, I, [name], attest that this documentation has been prepared under the direction and in the presence of Dr. Seabron Spates, DO. 04/18/2021   Patient ID: [name], female    DOB: [birth year], 25 y.o.   MRN: [id number]  Chief Complaint  Patient presents with   Cough    Pt states negative COVID test on Sunday.    Dizziness    HPI Patient is in today for an office visit.  She complains of a persistent cough for the last month. This is accompanied by unusual dizziness when standing up. She has been abnormally fatigued and in bed the last 3 days. She has taken a Covid-19 test and the result was negative.   She explains that hallucinations have decreased and anxiety has improved.  She is using Flonase and quetiapine every morning. At night she has been using 20 mg propranolol and 1 mg Lorazepam. She has not been compliant with taking 10 mg zyrtec everyday.   Past Medical History:  Diagnosis Date   Acute vaginitis 05/20/2019   ADD (attention deficit disorder) 04/24/2011   ADHD (attention deficit hyperactivity disorder)    Allergy    Anxiety    Benzodiazepine dependence (HCC) 05/12/2020   Bipolar disorder (HCC)    Bipolar disorder, current episode manic severe with psychotic features (HCC)    Bipolar I disorder (HCC) 05/12/2020   Constipation 04/03/2018   Delirium tremens (HCC) 05/20/2019   Depression    Eczema 12/11/13   Genital herpes simplex 10/24/2017   High risk heterosexual behavior 05/20/2019   MDD (major depressive disorder), recurrent severe, without psychosis (HCC) 02/11/2018   Morbid obesity (HCC) 09/06/2019   Pelvic pain 01/31/2018   Possible pregnancy 09/06/2019   Preventative health care 03/16/2013   Schizo-affective schizophrenia (HCC)    Schizoaffective disorder (HCC) 03/08/2018   Seizure (HCC) 05/20/2019   Severe bipolar I disorder, current or most recent episode depressed (HCC) 03/30/2017   STD exposure 05/13/2015    Syncope 03/03/2017   Vitamin D deficiency 03/10/2019    No past surgical history on file.  Family History  Problem Relation Age of Onset   Asthma Mother    Stroke Father    Heart disease Father    Asthma Father    Hypertension Father        pulmonary hypertension   Heart disease Maternal Uncle    Hypertension Maternal Grandmother    COPD Maternal Grandmother    Diabetes Maternal Grandfather    Stroke Paternal Grandmother    Diabetes Paternal Grandmother    Diabetes Paternal Grandfather    Leukemia Maternal Uncle     Social History   Socioeconomic History   Marital status: Single    Spouse name: Not on file   Number of children: Not on file   Years of education: Not on file   Highest education level: Not on file  Occupational History   Occupation: Unemployed  Tobacco Use   Smoking status: Never   Smokeless tobacco: Never  Vaping Use   Vaping Use: Former   Quit date: 09/01/2017  Substance and Sexual Activity   Alcohol use: Not Currently    Comment: occasionally   Drug use: Not Currently   Sexual activity: Not Currently    Birth control/protection: Pill  Other Topics Concern   Not on file  Social History Narrative   Lives with mom in an apartment on the second floor.  No children.  Currently not  working.  Education: college. Left handed   Social Determinants of Health   Financial Resource Strain: Not on file  Food Insecurity: Not on file  Transportation Needs: Not on file  Physical Activity: Not on file  Stress: Not on file  Social Connections: Not on file  Intimate Partner Violence: Not on file    Outpatient Medications Prior to Visit  Medication Sig Dispense Refill   cetirizine (ZYRTEC) 10 MG tablet Take 1/2 tablet (5 mg total) by mouth daily. 100 tablet 1   Cholecalciferol 1.25 MG (50000 UT) capsule TAKE 2 CAPSULES BY MOUTH PER WEEK 24 capsule 4   LORazepam (ATIVAN) 1 MG tablet TAKE 1 TABLET BY MOUTH 4 TIMES DAILY 360 tablet 0   paliperidone Palmitate  ER (INVEGA TRINZA) 819 MG/2.63ML SUSY injection Use as directed every 3 months 1 mL 3   propranolol (INDERAL) 20 MG tablet Take 1 tablet by mouth 3 times a day *Please call to schedule next appt* 90 tablet 0   QUEtiapine (SEROQUEL XR) 400 MG 24 hr tablet TAKE 2 TABLETS BY MOUTH EVERY NIGHT AT BEDTIME (Patient taking differently: Take 200 mg by mouth at bedtime.) 60 tablet 11   triamcinolone (KENALOG) 0.1 %      fluticasone (FLONASE) 50 MCG/ACT nasal spray PLACE 2 SPRAYS INTO EACH NOSTRIL EVERY DAY 16 g 1   No facility-administered medications prior to visit.    Allergies  Allergen Reactions   Concerta [Methylphenidate] Other (See Comments)    hallucinations   Terbinafine Other (See Comments)   Trazodone And Nefazodone Other (See Comments)    Pt stated she has nightmares   Clindamycin Hcl Rash   Haldol [Haloperidol] Anxiety    Unable to seat still   Terbinafine Hcl Nausea And Vomiting    Review of Systems  Constitutional:  Positive for malaise/fatigue (new onset (3 days)).  HENT:  Negative for congestion, ear discharge, ear pain, nosebleeds and sinus pain.   Respiratory:  Positive for cough and sputum production. Negative for shortness of breath and wheezing.   Neurological:  Positive for dizziness.      Objective:    Physical Exam Constitutional:      General: She is not in acute distress.    Appearance: Normal appearance. She is not ill-appearing.  HENT:     Head: Normocephalic and atraumatic.     Right Ear: External ear normal.     Left Ear: External ear normal.  Eyes:     Extraocular Movements: Extraocular movements intact.     Pupils: Pupils are equal, round, and reactive to light.  Cardiovascular:     Rate and Rhythm: Normal rate and regular rhythm.     Heart sounds: Normal heart sounds. No murmur heard.   No gallop.  Pulmonary:     Effort: Pulmonary effort is normal. No respiratory distress.     Breath sounds: Normal breath sounds. No wheezing or rales.  Skin:     General: Skin is warm and dry.  Neurological:     Mental Status: She is alert and oriented to person, place, and time.  Psychiatric:        Behavior: Behavior normal.        Judgment: Judgment normal.    BP 120/70 (BP Location: Left Arm, Patient Position: Sitting, Cuff Size: Large)   Pulse (!) 111   Temp 98 F (36.7 C) (Oral)   Resp 18   Ht  (1.626 m)   Wt 236 lb 3.2 oz (107.1 kg)  SpO2 97%   BMI 40.54 kg/m  Wt Readings from Last 3 Encounters:  04/18/21 236 lb 3.2 oz (107.1 kg)  01/06/21 222 lb (100.7 kg)  11/30/20 216 lb (98 kg)    Diabetic Foot Exam - Simple   No data filed    Lab Results  Component Value Date   WBC 5.6 10/06/2020   HGB 13.1 10/06/2020   HCT 39.1 10/06/2020   PLT 293.0 10/06/2020   GLUCOSE 93 10/06/2020   CHOL 196 10/06/2020   TRIG 100.0 10/06/2020   HDL 54.30 10/06/2020   LDLCALC 121 (H) 10/06/2020   ALT 15 10/06/2020   AST 13 10/06/2020   NA 137 10/06/2020   K 4.6 10/06/2020   CL 103 10/06/2020   CREATININE 0.78 10/06/2020   BUN 17 10/06/2020   CO2 28 10/06/2020   TSH 1.33 10/06/2020   HGBA1C 5.0 07/02/2020   MICROALBUR 0.6 08/02/2014    Lab Results  Component Value Date   TSH 1.33 10/06/2020   Lab Results  Component Value Date   WBC 5.6 10/06/2020   HGB 13.1 10/06/2020   HCT 39.1 10/06/2020   MCV 89.1 10/06/2020   PLT 293.0 10/06/2020   Lab Results  Component Value Date   NA 137 10/06/2020   K 4.6 10/06/2020   CO2 28 10/06/2020   GLUCOSE 93 10/06/2020   BUN 17 10/06/2020   CREATININE 0.78 10/06/2020   BILITOT 0.5 10/06/2020   ALKPHOS 75 10/06/2020   AST 13 10/06/2020   ALT 15 10/06/2020   PROT 7.1 10/06/2020   ALBUMIN 4.2 10/06/2020   CALCIUM 9.6 10/06/2020   ANIONGAP 12 07/02/2020   GFR 104.64 10/06/2020   Lab Results  Component Value Date   CHOL 196 10/06/2020   Lab Results  Component Value Date   HDL 54.30 10/06/2020   Lab Results  Component Value Date   LDLCALC 121 (H) 10/06/2020   Lab  Results  Component Value Date   TRIG 100.0 10/06/2020   Lab Results  Component Value Date   CHOLHDL 4 10/06/2020   Lab Results  Component Value Date   HGBA1C 5.0 07/02/2020       Assessment & Plan:   Problem List Items Addressed This Visit       Unprioritized   Allergy    nasonex and zyrtec rto prn        Relevant Medications   mometasone (NASONEX) 50 MCG/ACT nasal spray   Bronchitis - Primary    Most likely viral -- symptoms improving  Flu neg covid pending  Cough med per orders       Relevant Medications   promethazine-dextromethorphan (PROMETHAZINE-DM) 6.25-15 MG/5ML syrup   Other Relevant Orders   Novel Coronavirus, NAA ([facility])   POC Influenza A&B (Binax test) (Completed)     Meds ordered this encounter  Medications   mometasone (NASONEX) 50 MCG/ACT nasal spray    Sig: Place 2 sprays into the nose daily as directed    Dispense:  17 g    Refill:  12   promethazine-dextromethorphan (PROMETHAZINE-DM) 6.25-15 MG/5ML syrup    Sig: Take 5 mLs by mouth 4 (four) times daily as needed.    Dispense:  118 mL    Refill:  0    I, Dr. Seabron Spates, DO., personally preformed the services described in this documentation.  All medical record entries made by the scribe were at my direction and in my presence.  I have reviewed the chart and discharge instructions (if applicable)  and agree that the record reflects my personal performance and is accurate and complete. 04/18/2021  I,[name],acting as a scribe for [name], DO.,have documented all relevant documentation on the behalf of [name], DO,as directed by  [name], DO while in the presence of [name], DO.   [name], DO

## 2021-04-20 LAB — SARS-COV-2, NAA 2 DAY TAT

## 2021-04-20 LAB — NOVEL CORONAVIRUS, NAA: SARS-CoV-2, NAA: NOT DETECTED

## 2021-04-21 ENCOUNTER — Other Ambulatory Visit (HOSPITAL_BASED_OUTPATIENT_CLINIC_OR_DEPARTMENT_OTHER): Payer: Self-pay

## 2021-04-21 ENCOUNTER — Encounter: Payer: Self-pay | Admitting: Family Medicine

## 2021-04-21 ENCOUNTER — Other Ambulatory Visit: Payer: Self-pay | Admitting: Family Medicine

## 2021-04-21 DIAGNOSIS — J209 Acute bronchitis, unspecified: Secondary | ICD-10-CM

## 2021-04-21 DIAGNOSIS — J440 Chronic obstructive pulmonary disease with acute lower respiratory infection: Secondary | ICD-10-CM

## 2021-04-21 MED ORDER — AZITHROMYCIN 250 MG PO TABS
ORAL_TABLET | ORAL | 0 refills | Status: DC
  Filled 2021-04-21: qty 6, 5d supply, fill #0

## 2021-04-26 ENCOUNTER — Encounter: Payer: Self-pay | Admitting: Family Medicine

## 2021-04-28 ENCOUNTER — Telehealth (INDEPENDENT_AMBULATORY_CARE_PROVIDER_SITE_OTHER): Payer: Self-pay | Admitting: Family Medicine

## 2021-04-28 ENCOUNTER — Encounter: Payer: Self-pay | Admitting: Family Medicine

## 2021-04-28 ENCOUNTER — Other Ambulatory Visit: Payer: Self-pay

## 2021-04-28 DIAGNOSIS — U071 COVID-19: Secondary | ICD-10-CM

## 2021-04-28 NOTE — Progress Notes (Signed)
MyChart Video Visit Pt not seen    Virtual Visit via Video Note   This visit type was conducted due to national recommendations for restrictions regarding the COVID-19 Pandemic (e.g. social distancing) in an effort to limit this patient's exposure and mitigate transmission in our community. This patient is at least at moderate risk for complications without adequate follow up. This format is felt to be most appropriate for this patient at this time. Physical exam was limited by quality of the video and audio technology used for the visit. was able to get the patient set up on a video visit.  Patient location: Home Patient and provider in visit Provider location: Office  I discussed the limitations of evaluation and management by telemedicine and the availability of in person appointments. The patient expressed understanding and agreed to proceed.  Visit Date: 04/28/2021  Today's healthcare provider: [name], DO      Subjective:    Patient ID: [name], female    DOB: 04/15/1996, 25 y.o.   MRN: [id number]  No chief complaint on file.   HPI Patient is in today for a video visit.  She is Covid-19 positive  Past Medical History:  Diagnosis Date   Acute vaginitis 05/20/2019   ADD (attention deficit disorder) 04/24/2011   ADHD (attention deficit hyperactivity disorder)    Allergy    Anxiety    Benzodiazepine dependence (HCC) 05/12/2020   Bipolar disorder (HCC)    Bipolar disorder, current episode manic severe with psychotic features (HCC)    Bipolar I disorder (HCC) 05/12/2020   Constipation 04/03/2018   Delirium tremens (HCC) 05/20/2019   Depression    Eczema 12/11/13   Genital herpes simplex 10/24/2017   High risk heterosexual behavior 05/20/2019   MDD (major depressive disorder), recurrent severe, without psychosis (HCC) 02/11/2018   Morbid obesity (HCC) 09/06/2019   Pelvic pain 01/31/2018   Possible pregnancy 09/06/2019   Preventative health care 03/16/2013    Schizo-affective schizophrenia (HCC)    Schizoaffective disorder (HCC) 03/08/2018   Seizure (HCC) 05/20/2019   Severe bipolar I disorder, current or most recent episode depressed (HCC) 03/30/2017   STD exposure 05/13/2015   Syncope 03/03/2017   Vitamin D deficiency 03/10/2019    No past surgical history on file.  Family History  Problem Relation Age of Onset   Asthma Mother    Stroke Father    Heart disease Father    Asthma Father    Hypertension Father        pulmonary hypertension   Heart disease Maternal Uncle    Hypertension Maternal Grandmother    COPD Maternal Grandmother    Diabetes Maternal Grandfather    Stroke Paternal Grandmother    Diabetes Paternal Grandmother    Diabetes Paternal Grandfather    Leukemia Maternal Uncle     Social History   Socioeconomic History   Marital status: Single    Spouse name: Not on file   Number of children: Not on file   Years of education: Not on file   Highest education level: Not on file  Occupational History   Occupation: Unemployed  Tobacco Use   Smoking status: Never   Smokeless tobacco: Never  Vaping Use   Vaping Use: Former   Quit date: 09/01/2017  Substance and Sexual Activity   Alcohol use: Not Currently    Comment: occasionally   Drug use: Not Currently   Sexual activity: Not Currently    Birth control/protection: Pill  Other  Topics Concern   Not on file  Social History Narrative   Lives with mom in an apartment on the second floor.  No children.  Currently not working.  Education: college. Left handed   Social Determinants of Health   Financial Resource Strain: Not on file  Food Insecurity: Not on file  Transportation Needs: Not on file  Physical Activity: Not on file  Stress: Not on file  Social Connections: Not on file  Intimate Partner Violence: Not on file    Outpatient Medications Prior to Visit  Medication Sig Dispense Refill   azithromycin (ZITHROMAX Z-PAK) 250 MG tablet Take 2 tablets by  mouth day 1 then 1 tablet daily days 2-5 6 each 0   cetirizine (ZYRTEC) 10 MG tablet Take 1/2 tablet (5 mg total) by mouth daily. 100 tablet 1   Cholecalciferol 1.25 MG (50000 UT) capsule TAKE 2 CAPSULES BY MOUTH PER WEEK 24 capsule 4   mometasone (NASONEX) 50 MCG/ACT nasal spray Place 2 sprays into the nose daily as directed 17 g 12   paliperidone Palmitate ER (INVEGA TRINZA) 819 MG/2.63ML SUSY injection Use as directed every 3 months 1 mL 3   promethazine-dextromethorphan (PROMETHAZINE-DM) 6.25-15 MG/5ML syrup Take 5 mLs by mouth 4 (four) times daily as needed. 118 mL 0   propranolol (INDERAL) 20 MG tablet Take 1 tablet by mouth 3 times a day *Please call to schedule next appt* 90 tablet 0   QUEtiapine (SEROQUEL XR) 400 MG 24 hr tablet TAKE 2 TABLETS BY MOUTH EVERY NIGHT AT BEDTIME (Patient taking differently: Take 200 mg by mouth at bedtime.) 60 tablet 11   triamcinolone (KENALOG) 0.1 %      No facility-administered medications prior to visit.    Allergies  Allergen Reactions   Concerta [Methylphenidate] Other (See Comments)    hallucinations   Terbinafine Other (See Comments)   Trazodone And Nefazodone Other (See Comments)    Pt stated she has nightmares   Clindamycin Hcl Rash   Haldol [Haloperidol] Anxiety    Unable to seat still   Terbinafine Hcl Nausea And Vomiting    ROS     Objective:    Physical Exam  There were no vitals taken for this visit. Wt Readings from Last 3 Encounters:  04/18/21 236 lb 3.2 oz (107.1 kg)  01/06/21 222 lb (100.7 kg)  11/30/20 216 lb (98 kg)    Diabetic Foot Exam - Simple   No data filed    Lab Results  Component Value Date   WBC 5.6 10/06/2020   HGB 13.1 10/06/2020   HCT 39.1 10/06/2020   PLT 293.0 10/06/2020   GLUCOSE 93 10/06/2020   CHOL 196 10/06/2020   TRIG 100.0 10/06/2020   HDL 54.30 10/06/2020   LDLCALC 121 (H) 10/06/2020   ALT 15 10/06/2020   AST 13 10/06/2020   NA 137 10/06/2020   K 4.6 10/06/2020   CL 103  10/06/2020   CREATININE 0.78 10/06/2020   BUN 17 10/06/2020   CO2 28 10/06/2020   TSH 1.33 10/06/2020   HGBA1C 5.0 07/02/2020   MICROALBUR 0.6 08/02/2014    Lab Results  Component Value Date   TSH 1.33 10/06/2020   Lab Results  Component Value Date   WBC 5.6 10/06/2020   HGB 13.1 10/06/2020   HCT 39.1 10/06/2020   MCV 89.1 10/06/2020   PLT 293.0 10/06/2020   Lab Results  Component Value Date   NA 137 10/06/2020   K 4.6 10/06/2020   CO2  28 10/06/2020   GLUCOSE 93 10/06/2020   BUN 17 10/06/2020   CREATININE 0.78 10/06/2020   BILITOT 0.5 10/06/2020   ALKPHOS 75 10/06/2020   AST 13 10/06/2020   ALT 15 10/06/2020   PROT 7.1 10/06/2020   ALBUMIN 4.2 10/06/2020   CALCIUM 9.6 10/06/2020   ANIONGAP 12 07/02/2020   GFR 104.64 10/06/2020   Lab Results  Component Value Date   CHOL 196 10/06/2020   Lab Results  Component Value Date   HDL 54.30 10/06/2020   Lab Results  Component Value Date   LDLCALC 121 (H) 10/06/2020   Lab Results  Component Value Date   TRIG 100.0 10/06/2020   Lab Results  Component Value Date   CHOLHDL 4 10/06/2020   Lab Results  Component Value Date   HGBA1C 5.0 07/02/2020       Assessment & Plan:   Problem List Items Addressed This Visit   None   @ENCMEDP @  No orders of the defined types were placed in this encounter.   I discussed the assessment and treatment plan with the patient. The patient was provided an opportunity to ask questions and all were answered. The patient agreed with the plan and demonstrated an understanding of the instructions.   The patient was advised to call back or seek an in-person evaluation if the symptoms worsen or if the condition fails to improve as anticipated.  I provided 20 minutes of face-to-face time during this encounter.   I,Zite Okoli,acting as a for Neurosurgeon, DO.,have documented all relevant documentation on the behalf of [employer], DO,as directed by  [name], DO while in the presence of [name], DO.      [name], DO [facility] at [name] [phone] (phone) [phone] (fax)  [facility]

## 2021-05-09 ENCOUNTER — Ambulatory Visit (HOSPITAL_BASED_OUTPATIENT_CLINIC_OR_DEPARTMENT_OTHER)
Admission: RE | Admit: 2021-05-09 | Discharge: 2021-05-09 | Disposition: A | Discharge status: Home / Self Care | Payer: No Typology Code available for payment source | Source: Ambulatory Visit | Attending: Family Medicine | Admitting: Family Medicine

## 2021-05-09 ENCOUNTER — Other Ambulatory Visit: Payer: Self-pay

## 2021-05-09 ENCOUNTER — Telehealth: Payer: Self-pay | Admitting: *Deleted

## 2021-05-09 ENCOUNTER — Ambulatory Visit (INDEPENDENT_AMBULATORY_CARE_PROVIDER_SITE_OTHER): Payer: 59 | Admitting: Family Medicine

## 2021-05-09 ENCOUNTER — Other Ambulatory Visit (HOSPITAL_BASED_OUTPATIENT_CLINIC_OR_DEPARTMENT_OTHER): Payer: Self-pay

## 2021-05-09 ENCOUNTER — Encounter: Payer: Self-pay | Admitting: Family Medicine

## 2021-05-09 VITALS — BP 110/80 | HR 95 | Temp 98.8°F | Resp 18 | Ht 64.0 in | Wt 238.0 lb

## 2021-05-09 DIAGNOSIS — J40 Bronchitis, not specified as acute or chronic: Secondary | ICD-10-CM

## 2021-05-09 DIAGNOSIS — R059 Cough, unspecified: Secondary | ICD-10-CM

## 2021-05-09 DIAGNOSIS — F209 Schizophrenia, unspecified: Secondary | ICD-10-CM | POA: Diagnosis not present

## 2021-05-09 MED ORDER — PREDNISONE 10 MG PO TABS
ORAL_TABLET | ORAL | 0 refills | Status: DC
  Filled 2021-05-09: qty 20, 12d supply, fill #0

## 2021-05-09 MED ORDER — AZITHROMYCIN 250 MG PO TABS
ORAL_TABLET | ORAL | 0 refills | Status: DC
  Filled 2021-05-09: qty 6, 5d supply, fill #0

## 2021-05-09 MED ORDER — PROMETHAZINE-DM 6.25-15 MG/5ML PO SYRP
5.0000 mL | ORAL_SOLUTION | Freq: Four times a day (QID) | ORAL | 0 refills | Status: DC | PRN
  Filled 2021-05-09: qty 118, 6d supply, fill #0

## 2021-05-09 NOTE — Telephone Encounter (Signed)
Danielle from pharmacy called about interaction between [facility] and azithromycin and that it could cause long QT interval.  Advised that patient had taken this on 04/21/21.  They will fill for patient.

## 2021-05-09 NOTE — Patient Instructions (Signed)
Acute Bronchitis, Adult  Acute bronchitis is sudden or acute swelling of the air tubes (bronchi) in the lungs. Acute bronchitis causes these tubes to fill with mucus, whichcan make it hard to breathe. It can also cause coughing or wheezing. In adults, acute bronchitis usually goes away within 2 weeks. A cough caused by bronchitis may last up to 3 weeks. Smoking, allergies, and asthma can make thecondition worse. What are the causes? This condition can be caused by germs and by substances that irritate the lungs, including: Cold and flu viruses. The most common cause of this condition is the virus that causes the common cold. Bacteria. Substances that irritate the lungs, including: Smoke from cigarettes and other forms of tobacco. Dust and pollen. Fumes from chemical products, gases, or burned fuel. Other materials that pollute indoor or outdoor air. Close contact with someone who has acute bronchitis. What increases the risk? The following factors may make you more likely to develop this condition: A weak body's defense system, also called the immune system. A condition that affects your lungs and breathing, such as asthma. What are the signs or symptoms? Common symptoms of this condition include: Lung and breathing problems, such as: Coughing. This may bring up clear, yellow, or green mucus from your lungs (sputum). Wheezing. Having too much mucus in your lungs (chest congestion). Having shortness of breath. A fever. Chills. Aches and pains, including: Tightness in your chest and other body aches. A sore throat. How is this diagnosed? This condition is usually diagnosed based on: Your symptoms and medical history. A physical exam. You may also have other tests, including tests to rule out other conditions, such as pneumonia. These tests include: A test of lung function. Test of a mucus sample to look for the presence of bacteria. Tests to check the oxygen level in your  blood. Blood tests. Chest X-ray. How is this treated? Most cases of acute bronchitis clear up over time without treatment. Your health care provider may recommend: Drinking more fluids. This can thin your mucus, which may improve your breathing. Using a device that gets medicine into your lungs (inhaler) to help improve breathing and control coughing. Using a vaporizer or a humidifier. These are machines that add water to the air to help you breathe better. Taking a medicine for a fever. Taking a medicine that thins mucus and clears congestion (expectorant). Taking a medicine that prevents or stops coughing (cough suppressant). Follow these instructions at home: Activity Get plenty of rest. Return to your normal activities as told by your health care provider. Ask your health care provider what activities are safe for you. Lifestyle  Drink enough fluid to keep your urine pale yellow. Do not drink alcohol. Do not use any products that contain nicotine or tobacco, such as cigarettes, e-cigarettes, and chewing tobacco. If you need help quitting, ask your health care provider. Be aware that: Your bronchitis will get worse if you smoke or breathe in other people's smoke (secondhand smoke). Your lungs will heal faster if you quit smoking.  General instructions Take over-the-counter and prescription medicines only as told by your health care provider. Use an inhaler, vaporizer, or humidifier as told by your health care provider. If you have a sore throat, gargle with a salt-water mixture 3-4 times a day or as needed. To make a salt-water mixture, completely dissolve -1 tsp (3-6 g) of salt in 1 cup (237 mL) of warm water. Take two teaspoons of honey at bedtime to lessen coughing at night.   Keep all follow-up visits as told by your health care provider. This is important. How is this prevented? To lower your risk of getting this condition again: Wash your hands often with soap and water. If  soap and water are not available, use hand sanitizer. Avoid contact with people who have cold symptoms. Try not to touch your mouth, nose, or eyes with your hands. Avoid places where there are fumes from chemicals. Breathing these fumes will make your condition worse. Get the flu shot every year. Contact a health care provider if: Your symptoms do not improve after 2 weeks of treatment. You vomit more than once or twice. You have symptoms of dehydration such as: Dark urine. Dry skin or eyes. Increased thirst. Headaches. Confusion. Muscle cramps. Get help right away if you: Cough up blood. Feel pain in your chest. Have severe shortness of breath. Faint or keep feeling like you are going to faint. Have a severe headache. Have fever or chills that get worse. These symptoms may represent a serious problem that is an emergency. Do not wait to see if the symptoms will go away. Get medical help right away. Call your local emergency services (911 in the [place] Do not drive yourself to the hospital. Summary Acute bronchitis is sudden (acute) inflammation of the air tubes (bronchi) between the windpipe and the lungs. In adults, acute bronchitis usually goes away within 2 weeks, although coughing may last 3 weeks or longer. Take over-the-counter and prescription medicines only as told by your health care provider. Drink enough fluid to keep your urine pale yellow. Contact a health care provider if your symptoms do not improve after 2 weeks of treatment. Get help right away if you cough up blood, faint, or have chest pain or shortness of breath. This information is not intended to replace advice given to you by your health care provider. Make sure you discuss any questions you have with your healthcare provider. Document Revised: 06/29/2020 Document Reviewed: 02/20/2019 Elsevier Patient Education  2022 [employer]  

## 2021-05-09 NOTE — Progress Notes (Signed)
Established Patient Office Visit  Subjective:  Patient ID: [name], female    DOB: October 22, 1995  Age: 25 y.o. MRN: [id number]  CC:  Chief Complaint  Patient presents with   Cough   Follow-up    HPI [name] presents for hx covid 9/13 -------she had cough, no fever, headache and runny nose  The cough has not resolved   Past Medical History:  Diagnosis Date   Acute vaginitis 05/20/2019   ADD (attention deficit disorder) 04/24/2011   ADHD (attention deficit hyperactivity disorder)    Allergy    Anxiety    Benzodiazepine dependence (HCC) 05/12/2020   Bipolar disorder (HCC)    Bipolar disorder, current episode manic severe with psychotic features (HCC)    Bipolar I disorder (HCC) 05/12/2020   Constipation 04/03/2018   Delirium tremens (HCC) 05/20/2019   Depression    Eczema 12/11/13   Genital herpes simplex 10/24/2017   High risk heterosexual behavior 05/20/2019   MDD (major depressive disorder), recurrent severe, without psychosis (HCC) 02/11/2018   Morbid obesity (HCC) 09/06/2019   Pelvic pain 01/31/2018   Possible pregnancy 09/06/2019   Preventative health care 03/16/2013   Schizo-affective schizophrenia (HCC)    Schizoaffective disorder (HCC) 03/08/2018   Seizure (HCC) 05/20/2019   Severe bipolar I disorder, current or most recent episode depressed (HCC) 03/30/2017   STD exposure 05/13/2015   Syncope 03/03/2017   Vitamin D deficiency 03/10/2019    No past surgical history on file.  Family History  Problem Relation Age of Onset   Asthma Mother    Stroke Father    Heart disease Father    Asthma Father    Hypertension Father        pulmonary hypertension   Heart disease Maternal Uncle    Hypertension Maternal Grandmother    COPD Maternal Grandmother    Diabetes Maternal Grandfather    Stroke Paternal Grandmother    Diabetes Paternal Grandmother    Diabetes Paternal Grandfather    Leukemia Maternal Uncle     Social History   Socioeconomic History    Marital status: Single    Spouse name: Not on file   Number of children: Not on file   Years of education: Not on file   Highest education level: Not on file  Occupational History   Occupation: Unemployed  Tobacco Use   Smoking status: Never   Smokeless tobacco: Never  Vaping Use   Vaping Use: Former   Quit date: 09/01/2017  Substance and Sexual Activity   Alcohol use: Not Currently    Comment: occasionally   Drug use: Not Currently   Sexual activity: Not Currently    Birth control/protection: Pill  Other Topics Concern   Not on file  Social History Narrative   Lives with mom in an apartment on the second floor.  No children.  Currently not working.  Education: college. Left handed   Social Determinants of Health   Financial Resource Strain: Not on file  Food Insecurity: Not on file  Transportation Needs: Not on file  Physical Activity: Not on file  Stress: Not on file  Social Connections: Not on file  Intimate Partner Violence: Not on file    Outpatient Medications Prior to Visit  Medication Sig Dispense Refill   cetirizine (ZYRTEC) 10 MG tablet Take 1/2 tablet (5 mg total) by mouth daily. 100 tablet 1   mometasone (NASONEX) 50 MCG/ACT nasal spray Place 2 sprays into the nose daily as directed 17 g  12   paliperidone Palmitate ER (INVEGA TRINZA) 819 MG/2.63ML SUSY injection Use as directed every 3 months 1 mL 3   propranolol (INDERAL) 20 MG tablet Take 1 tablet by mouth 3 times a day *Please call to schedule next appt* 90 tablet 0   QUEtiapine (SEROQUEL XR) 400 MG 24 hr tablet TAKE 2 TABLETS BY MOUTH EVERY NIGHT AT BEDTIME (Patient taking differently: Take 200 mg by mouth at bedtime.) 60 tablet 11   triamcinolone (KENALOG) 0.1 %      promethazine-dextromethorphan (PROMETHAZINE-DM) 6.25-15 MG/5ML syrup Take 5 mLs by mouth 4 (four) times daily as needed. 118 mL 0   azithromycin (ZITHROMAX Z-PAK) 250 MG tablet Take 2 tablets by mouth day 1 then 1 tablet daily days 2-5 (Patient  not taking: Reported on 05/09/2021) 6 each 0   No facility-administered medications prior to visit.    Allergies  Allergen Reactions   Concerta [Methylphenidate] Other (See Comments)    hallucinations   Terbinafine Other (See Comments)   Trazodone And Nefazodone Other (See Comments)    Pt stated she has nightmares   Clindamycin Hcl Rash   Haldol [Haloperidol] Anxiety    Unable to seat still   Terbinafine Hcl Nausea And Vomiting    ROS Review of Systems  Constitutional:  Negative for appetite change, diaphoresis, fatigue and unexpected weight change.  Eyes:  Negative for pain, redness and visual disturbance.  Respiratory:  Positive for cough and wheezing. Negative for chest tightness and shortness of breath.   Cardiovascular:  Negative for chest pain, palpitations and leg swelling.  Endocrine: Negative for cold intolerance, heat intolerance, polydipsia, polyphagia and polyuria.  Genitourinary:  Negative for difficulty urinating, dysuria and frequency.  Neurological:  Negative for dizziness, light-headedness, numbness and headaches.     Objective:    Physical Exam Vitals and nursing note reviewed.  Constitutional:      Appearance: She is well-developed.  HENT:     Head: Normocephalic and atraumatic.  Eyes:     Conjunctiva/sclera: Conjunctivae normal.  Neck:     Thyroid: No thyromegaly.     Vascular: No carotid bruit or JVD.  Cardiovascular:     Rate and Rhythm: Normal rate and regular rhythm.     Heart sounds: Normal heart sounds. No murmur heard. Pulmonary:     Effort: Pulmonary effort is normal. No respiratory distress.     Breath sounds: Wheezing present. No rhonchi or rales.  Chest:     Chest wall: No tenderness.  Musculoskeletal:     Cervical back: Normal range of motion and neck supple.  Neurological:     Mental Status: She is alert and oriented to person, place, and time.    BP 110/80 (BP Location: Left Arm, Patient Position: Sitting, Cuff Size: Large)    Pulse 95   Temp 98.8 F (37.1 C) (Oral)   Resp 18   Ht 5\' 4"  (1.626 m)   Wt 238 lb (108 kg)   SpO2 97%   BMI 40.85 kg/m  Wt Readings from Last 3 Encounters:  05/09/21 238 lb (108 kg)  04/18/21 236 lb 3.2 oz (107.1 kg)  01/06/21 222 lb (100.7 kg)     Health Maintenance Due  Topic Date Due   PAP-Cervical Cytology Screening  11/03/2019   PAP SMEAR-Modifier  11/03/2019   COVID-19 Vaccine (4 - Booster for Pfizer series) 02/22/2021   INFLUENZA VACCINE  03/13/2021    There are no preventive care reminders to display for this patient.  Lab Results  Component Value Date   TSH 1.33 10/06/2020   Lab Results  Component Value Date   WBC 5.6 10/06/2020   HGB 13.1 10/06/2020   HCT 39.1 10/06/2020   MCV 89.1 10/06/2020   PLT 293.0 10/06/2020   Lab Results  Component Value Date   NA 137 10/06/2020   K 4.6 10/06/2020   CO2 28 10/06/2020   GLUCOSE 93 10/06/2020   BUN 17 10/06/2020   CREATININE 0.78 10/06/2020   BILITOT 0.5 10/06/2020   ALKPHOS 75 10/06/2020   AST 13 10/06/2020   ALT 15 10/06/2020   PROT 7.1 10/06/2020   ALBUMIN 4.2 10/06/2020   CALCIUM 9.6 10/06/2020   ANIONGAP 12 07/02/2020   GFR 104.64 10/06/2020   Lab Results  Component Value Date   CHOL 196 10/06/2020   Lab Results  Component Value Date   HDL 54.30 10/06/2020   Lab Results  Component Value Date   LDLCALC 121 (H) 10/06/2020   Lab Results  Component Value Date   TRIG 100.0 10/06/2020   Lab Results  Component Value Date   CHOLHDL 4 10/06/2020   Lab Results  Component Value Date   HGBA1C 5.0 07/02/2020      Assessment & Plan:   Problem List Items Addressed This Visit       Unprioritized   Bronchitis - Primary    S/p covid Tested neg today z pack  pred taper and refill cough med cxr F/u prn      Relevant Medications   azithromycin (ZITHROMAX Z-PAK) 250 MG tablet   predniSONE (DELTASONE) 10 MG tablet   promethazine-dextromethorphan (PROMETHAZINE-DM) 6.25-15 MG/5ML syrup    Other Visit Diagnoses     Cough       Relevant Medications   predniSONE (DELTASONE) 10 MG tablet   Other Relevant Orders   DG Chest 2 View       Meds ordered this encounter  Medications   azithromycin (ZITHROMAX Z-PAK) 250 MG tablet    Sig: As directed    Dispense:  6 each    Refill:  0   predniSONE (DELTASONE) 10 MG tablet    Sig: TAKE 3 TABLETS PO QD FOR 3 DAYS THEN TAKE 2 TABLETS PO QD FOR 3 DAYS THEN TAKE 1 TABLET PO QD FOR 3 DAYS THEN TAKE 1/2 TAB PO QD FOR 3 DAYS    Dispense:  20 tablet    Refill:  0   promethazine-dextromethorphan (PROMETHAZINE-DM) 6.25-15 MG/5ML syrup    Sig: Take 5 mLs by mouth 4 (four) times daily as needed.    Dispense:  118 mL    Refill:  0    Follow-up: Return if symptoms worsen or fail to improve.    [name], DO

## 2021-05-09 NOTE — Assessment & Plan Note (Signed)
S/p covid Tested neg today z pack  pred taper and refill cough med cxr F/u prn

## 2021-05-15 DIAGNOSIS — F209 Schizophrenia, unspecified: Secondary | ICD-10-CM | POA: Diagnosis not present

## 2021-06-08 ENCOUNTER — Ambulatory Visit: Payer: 59 | Admitting: Family Medicine

## 2021-06-13 ENCOUNTER — Other Ambulatory Visit (HOSPITAL_BASED_OUTPATIENT_CLINIC_OR_DEPARTMENT_OTHER): Payer: Self-pay

## 2021-06-13 ENCOUNTER — Telehealth (INDEPENDENT_AMBULATORY_CARE_PROVIDER_SITE_OTHER): Payer: No Typology Code available for payment source | Admitting: Family Medicine

## 2021-06-13 ENCOUNTER — Other Ambulatory Visit: Payer: Self-pay

## 2021-06-13 ENCOUNTER — Encounter: Payer: Self-pay | Admitting: Family Medicine

## 2021-06-13 DIAGNOSIS — K529 Noninfective gastroenteritis and colitis, unspecified: Secondary | ICD-10-CM | POA: Diagnosis not present

## 2021-06-13 DIAGNOSIS — Z3009 Encounter for other general counseling and advice on contraception: Secondary | ICD-10-CM

## 2021-06-13 MED ORDER — NORGESTIMATE-ETH ESTRADIOL 0.25-35 MG-MCG PO TABS
1.0000 | ORAL_TABLET | Freq: Every day | ORAL | 11 refills | Status: DC
  Filled 2021-06-13: qty 28, 28d supply, fill #0
  Filled 2021-07-17: qty 84, 84d supply, fill #1
  Filled 2021-09-21: qty 84, 84d supply, fill #2

## 2021-06-13 NOTE — Assessment & Plan Note (Signed)
rx sent in Pt has not been sexually active but wants to be Pt understands how to take the pill Use condoms as well  rto prn

## 2021-06-13 NOTE — Assessment & Plan Note (Addendum)
N//V/d HAVE Resolved -- advance diet as tolerated pedialyte prn for no more 3 days  rto if symptoms return

## 2021-06-13 NOTE — Progress Notes (Signed)
MyChart Video Visit    Virtual Visit via Video Note   This visit type was conducted due to national recommendations for restrictions regarding the COVID-19 Pandemic (e.g. social distancing) in an effort to limit this patient's exposure and mitigate transmission in our community. This patient is at least at moderate risk for complications without adequate follow up. This format is felt to be most appropriate for this patient at this time. Physical exam was limited by quality of the video and audio technology used for the visit. [name] was able to get the patient set up on a video visit.  Patient location: Home Patient and provider in visit Provider location: Office  I discussed the limitations of evaluation and management by telemedicine and the availability of in person appointments. The patient expressed understanding and agreed to proceed.  Visit Date: 06/13/2021  Today's healthcare provider: [name], DO     Subjective:    Patient ID: [name], female    DOB: Apr 21, 1996, 25 y.o.   MRN: [id number]  Chief Complaint  Patient presents with   Bronchitis         HPI Patient is in today for a video visit.   Stomach virus- She continues having a cough. She also contracted a stomach virus from her younger niece yesterday. She developed decreased appetite, vomiting, diarrhea, and fever all day yesterday. She has no fever today but continues having decreased appetite. She denies having any body aches, coughing, and congestion a this time.  Birth control- She is requesting to take birth control due to wanting to become sexually active. She continues invega injections and does not have menstrual cycles while taking it. She continues taking 400 mg Seroquel 2x daily PO.    Past Medical History:  Diagnosis Date   Acute vaginitis 05/20/2019   ADD (attention deficit disorder) 04/24/2011   ADHD (attention deficit hyperactivity disorder)    Allergy     Anxiety    Benzodiazepine dependence (HCC) 05/12/2020   Bipolar disorder (HCC)    Bipolar disorder, current episode manic severe with psychotic features (HCC)    Bipolar I disorder (HCC) 05/12/2020   Constipation 04/03/2018   Delirium tremens (HCC) 05/20/2019   Depression    Eczema 12/11/13   Genital herpes simplex 10/24/2017   High risk heterosexual behavior 05/20/2019   MDD (major depressive disorder), recurrent severe, without psychosis (HCC) 02/11/2018   Morbid obesity (HCC) 09/06/2019   Pelvic pain 01/31/2018   Possible pregnancy 09/06/2019   Preventative health care 03/16/2013   Schizo-affective schizophrenia (HCC)    Schizoaffective disorder (HCC) 03/08/2018   Seizure (HCC) 05/20/2019   Severe bipolar I disorder, current or most recent episode depressed (HCC) 03/30/2017   STD exposure 05/13/2015   Syncope 03/03/2017   Vitamin D deficiency 03/10/2019    No past surgical history on file.  Family History  Problem Relation Age of Onset   Asthma Mother    Stroke Father    Heart disease Father    Asthma Father    Hypertension Father        pulmonary hypertension   Heart disease Maternal Uncle    Hypertension Maternal Grandmother    COPD Maternal Grandmother    Diabetes Maternal Grandfather    Stroke Paternal Grandmother    Diabetes Paternal Grandmother    Diabetes Paternal Grandfather    Leukemia Maternal Uncle     Social History   Socioeconomic History   Marital status: Single    Spouse  name: Not on file   Number of children: Not on file   Years of education: Not on file   Highest education level: Not on file  Occupational History   Occupation: Unemployed  Tobacco Use   Smoking status: Never   Smokeless tobacco: Never  Vaping Use   Vaping Use: Former   Quit date: 09/01/2017  Substance and Sexual Activity   Alcohol use: Not Currently    Comment: occasionally   Drug use: Not Currently   Sexual activity: Not Currently    Birth control/protection: Pill  Other Topics Concern    Not on file  Social History Narrative   Lives with mom in an apartment on the second floor.  No children.  Currently not working.  Education: college. Left handed   Social Determinants of Health   Financial Resource Strain: Not on file  Food Insecurity: Not on file  Transportation Needs: Not on file  Physical Activity: Not on file  Stress: Not on file  Social Connections: Not on file  Intimate Partner Violence: Not on file    Outpatient Medications Prior to Visit  Medication Sig Dispense Refill   cetirizine (ZYRTEC) 10 MG tablet Take 1/2 tablet (5 mg total) by mouth daily. 100 tablet 1   mometasone (NASONEX) 50 MCG/ACT nasal spray Place 2 sprays into the nose daily as directed 17 g 12   paliperidone Palmitate ER (INVEGA TRINZA) 819 MG/2.63ML SUSY injection Use as directed every 3 months 1 mL 3   propranolol (INDERAL) 20 MG tablet Take 1 tablet by mouth 3 times a day *Please call to schedule next appt* 90 tablet 0   QUEtiapine (SEROQUEL XR) 400 MG 24 hr tablet TAKE 2 TABLETS BY MOUTH EVERY NIGHT AT BEDTIME (Patient taking differently: Take 200 mg by mouth at bedtime.) 60 tablet 11   triamcinolone (KENALOG) 0.1 %      azithromycin (ZITHROMAX Z-PAK) 250 MG tablet Take as directed (Patient not taking: Reported on 06/13/2021) 6 each 0   predniSONE (DELTASONE) 10 MG tablet TAKE 3 TABLETS BY MOUTH ONCE DAILY FOR 3 DAYS THEN TAKE 2 TABLETS FOR 3 DAYS THEN TAKE 1 TABLET FOR 3 DAYS THEN TAKE 1/2 TAB FOR 3 DAYS 20 tablet 0   promethazine-dextromethorphan (PROMETHAZINE-DM) 6.25-15 MG/5ML syrup Take 5 mLs by mouth 4 (four) times daily as needed. 118 mL 0   No facility-administered medications prior to visit.    Allergies  Allergen Reactions   Concerta [Methylphenidate] Other (See Comments)    hallucinations   Terbinafine Other (See Comments)   Trazodone And Nefazodone Other (See Comments)    Pt stated she has nightmares   Clindamycin Hcl Rash   Haldol [Haloperidol] Anxiety    Unable to  seat still   Terbinafine Hcl Nausea And Vomiting    Review of Systems  Constitutional:  Negative for chills, fever and malaise/fatigue.       (+)decreased appetite  HENT:  Negative for congestion and hearing loss.   Eyes:  Negative for discharge.  Respiratory:  Negative for cough, sputum production and shortness of breath.   Cardiovascular:  Negative for chest pain, palpitations and leg swelling.  Gastrointestinal:  Positive for diarrhea. Negative for abdominal pain, blood in stool, constipation, heartburn, nausea and vomiting.  Genitourinary:  Negative for dysuria, frequency, hematuria and urgency.  Musculoskeletal:  Negative for back pain, falls and myalgias (general body aches).  Skin:  Negative for rash.  Neurological:  Negative for dizziness, sensory change, loss of consciousness, weakness and headaches.  Endo/Heme/Allergies:  Negative for environmental allergies. Does not bruise/bleed easily.  Psychiatric/Behavioral:  Negative for depression and suicidal ideas. The patient is not nervous/anxious and does not have insomnia.       Objective:    Physical Exam Vitals and nursing note reviewed.  Constitutional:      General: She is not in acute distress.    Appearance: Normal appearance. She is well-developed. She is not ill-appearing, toxic-appearing or diaphoretic.  HENT:     Head: Normocephalic and atraumatic.  Neck:     Thyroid: No thyromegaly.     Vascular: No carotid bruit or JVD.  Pulmonary:     Effort: Pulmonary effort is normal.  Musculoskeletal:     Cervical back: Normal range of motion and neck supple.  Neurological:     Mental Status: She is alert and oriented to person, place, and time.    There were no vitals taken for this visit. Wt Readings from Last 3 Encounters:  05/09/21 238 lb (108 kg)  04/18/21 236 lb 3.2 oz (107.1 kg)  01/06/21 222 lb (100.7 kg)    Diabetic Foot Exam - Simple   No data filed    Lab Results  Component Value Date   WBC 5.6  10/06/2020   HGB 13.1 10/06/2020   HCT 39.1 10/06/2020   PLT 293.0 10/06/2020   GLUCOSE 93 10/06/2020   CHOL 196 10/06/2020   TRIG 100.0 10/06/2020   HDL 54.30 10/06/2020   LDLCALC 121 (H) 10/06/2020   ALT 15 10/06/2020   AST 13 10/06/2020   NA 137 10/06/2020   K 4.6 10/06/2020   CL 103 10/06/2020   CREATININE 0.78 10/06/2020   BUN 17 10/06/2020   CO2 28 10/06/2020   TSH 1.33 10/06/2020   HGBA1C 5.0 07/02/2020   MICROALBUR 0.6 08/02/2014    Lab Results  Component Value Date   TSH 1.33 10/06/2020   Lab Results  Component Value Date   WBC 5.6 10/06/2020   HGB 13.1 10/06/2020   HCT 39.1 10/06/2020   MCV 89.1 10/06/2020   PLT 293.0 10/06/2020   Lab Results  Component Value Date   NA 137 10/06/2020   K 4.6 10/06/2020   CO2 28 10/06/2020   GLUCOSE 93 10/06/2020   BUN 17 10/06/2020   CREATININE 0.78 10/06/2020   BILITOT 0.5 10/06/2020   ALKPHOS 75 10/06/2020   AST 13 10/06/2020   ALT 15 10/06/2020   PROT 7.1 10/06/2020   ALBUMIN 4.2 10/06/2020   CALCIUM 9.6 10/06/2020   ANIONGAP 12 07/02/2020   GFR 104.64 10/06/2020   Lab Results  Component Value Date   CHOL 196 10/06/2020   Lab Results  Component Value Date   HDL 54.30 10/06/2020   Lab Results  Component Value Date   LDLCALC 121 (H) 10/06/2020   Lab Results  Component Value Date   TRIG 100.0 10/06/2020   Lab Results  Component Value Date   CHOLHDL 4 10/06/2020   Lab Results  Component Value Date   HGBA1C 5.0 07/02/2020       Assessment & Plan:   Problem List Items Addressed This Visit       Unprioritized   Birth control counseling    rx sent in Pt has not been sexually active but wants to be Pt understands how to take the pill Use condoms as well  rto prn       Relevant Medications   norgestimate-ethinyl estradiol ([place] 28) 0.25-35 MG-MCG tablet   Gastroenteritis - Primary  N//V/d HAVE Resolved -- advance diet as tolerated pedialyte prn for no more 3 days  rto if  symptoms return         Meds ordered this encounter  Medications   norgestimate-ethinyl estradiol (SPRINTEC 28) 0.25-35 MG-MCG tablet    Sig: Take 1 tablet by mouth daily.    Dispense:  28 tablet    Refill:  11    I discussed the assessment and treatment plan with the patient. The patient was provided an opportunity to ask questions and all were answered. The patient agreed with the plan and demonstrated an understanding of the instructions.   The patient was advised to call back or seek an in-person evaluation if the symptoms worsen or if the condition fails to improve as anticipated.  I,[name],acting as a Education administrator for [employer], DO.,have documented all relevant documentation on the behalf of Jaclyn Held, DO,as directed by  Jaclyn Held, DO while in the presence of Jaclyn Held, DO.  I provided 20 minutes of face-to-face time during this encounter.   Jaclyn Held, DO [place] [facility] [employer] [facility] [phone] (phone) [phone] (fax)  [place]

## 2021-06-14 ENCOUNTER — Other Ambulatory Visit (HOSPITAL_BASED_OUTPATIENT_CLINIC_OR_DEPARTMENT_OTHER): Payer: Self-pay

## 2021-06-14 ENCOUNTER — Other Ambulatory Visit: Payer: Self-pay

## 2021-06-14 ENCOUNTER — Other Ambulatory Visit: Payer: Self-pay | Admitting: Family Medicine

## 2021-06-14 DIAGNOSIS — J40 Bronchitis, not specified as acute or chronic: Secondary | ICD-10-CM

## 2021-06-14 MED ORDER — VITAMIN D3 1.25 MG (50000 UT) PO CAPS
ORAL_CAPSULE | ORAL | 0 refills | Status: DC
  Filled 2021-06-14: qty 4, 28d supply, fill #0

## 2021-06-17 ENCOUNTER — Other Ambulatory Visit (HOSPITAL_COMMUNITY): Payer: Self-pay

## 2021-06-21 ENCOUNTER — Other Ambulatory Visit (HOSPITAL_BASED_OUTPATIENT_CLINIC_OR_DEPARTMENT_OTHER): Payer: Self-pay

## 2021-06-21 ENCOUNTER — Other Ambulatory Visit: Payer: Self-pay | Admitting: Family Medicine

## 2021-06-21 ENCOUNTER — Telehealth: Payer: Self-pay | Admitting: Family Medicine

## 2021-06-21 MED ORDER — VALACYCLOVIR HCL 1 G PO TABS
1000.0000 mg | ORAL_TABLET | Freq: Two times a day (BID) | ORAL | 0 refills | Status: DC
  Filled 2021-06-21: qty 20, 10d supply, fill #0

## 2021-06-21 NOTE — Telephone Encounter (Signed)
Looks like this was last refilled in 2019. Please advise

## 2021-06-21 NOTE — Telephone Encounter (Signed)
Pt's mom called stating she needs a refill of her daughter's valacyclovir sent to the [facility]. I could not find the medicine on her med list. Please advise.

## 2021-06-23 ENCOUNTER — Encounter: Payer: Self-pay | Admitting: Neurology

## 2021-06-23 ENCOUNTER — Ambulatory Visit (INDEPENDENT_AMBULATORY_CARE_PROVIDER_SITE_OTHER): Payer: No Typology Code available for payment source | Admitting: Neurology

## 2021-06-23 ENCOUNTER — Other Ambulatory Visit: Payer: Self-pay

## 2021-06-23 VITALS — BP 119/88 | HR 76 | Resp 18 | Ht 64.0 in | Wt 240.0 lb

## 2021-06-23 DIAGNOSIS — R569 Unspecified convulsions: Secondary | ICD-10-CM

## 2021-06-23 DIAGNOSIS — R443 Hallucinations, unspecified: Secondary | ICD-10-CM
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
## 2021-06-23 NOTE — Patient Instructions (Signed)
Good to see you are doing well! Continue all your medications. Follow-up as needed, call for any changes.

## 2021-06-26 ENCOUNTER — Emergency Department (HOSPITAL_COMMUNITY): Payer: Medicare Other

## 2021-06-26 ENCOUNTER — Emergency Department (HOSPITAL_COMMUNITY)
Admission: EM | Admit: 2021-06-26 | Discharge: 2021-06-26 | Disposition: A | Discharge status: Home / Self Care | Payer: Medicare Other | Attending: Emergency Medicine | Admitting: Emergency Medicine

## 2021-06-26 DIAGNOSIS — Z79899 Other long term (current) drug therapy: Secondary | ICD-10-CM | POA: Insufficient documentation

## 2021-06-26 DIAGNOSIS — S82831A Other fracture of upper and lower end of right fibula, initial encounter for closed fracture: Secondary | ICD-10-CM | POA: Diagnosis not present

## 2021-06-26 DIAGNOSIS — W108XXA Fall (on) (from) other stairs and steps, initial encounter: Secondary | ICD-10-CM | POA: Insufficient documentation

## 2021-06-26 DIAGNOSIS — S99911A Unspecified injury of right ankle, initial encounter: Secondary | ICD-10-CM | POA: Diagnosis present

## 2021-06-26 MED ORDER — ACETAMINOPHEN 500 MG PO TABS
1000.0000 mg | ORAL_TABLET | Freq: Once | ORAL | Status: AC
  Administered 2021-06-26: 1000 mg via ORAL
  Filled 2021-06-26: qty 2

## 2021-06-26 NOTE — ED Triage Notes (Signed)
Pt here POV d/t right ankle pain. Pt states she tripped down stairs and injured her right ankle. Denies LOC. Denies all other injuries

## 2021-06-26 NOTE — ED Provider Notes (Signed)
[facility] EMERGENCY DEPARTMENT Provider Note   CSN: [id number] Arrival date & time: 06/26/21  1119     History Chief Complaint  Patient presents with   Ankle Pain    [name] is a 25 y.o. female.  She is complaining of right ankle pain after she tripped and fell going down the stairs last night.  Pain is worse with movement and weightbearing.  She denies any other injuries or complaints.  No numbness or weakness.  The history is provided by the patient.  Ankle Pain Location:  Ankle Time since incident:  6 hours Injury: yes   Mechanism of injury: fall   Fall:    Fall occurred:  Down stairs   Entrapped after fall: no   Ankle location:  R ankle Pain details:    Quality:  Aching   Radiates to:  Does not radiate   Severity:  Severe   Onset quality:  Sudden   Timing:  Constant   Progression:  Unchanged Chronicity:  New Foreign body present:  No foreign bodies Prior injury to area:  No Relieved by:  Elevation and rest Worsened by:  Bearing weight Ineffective treatments:  None tried Associated symptoms: no back pain, no decreased ROM, no fever, no neck pain, no numbness and no tingling       Past Medical History:  Diagnosis Date   Acute vaginitis 05/20/2019   ADD (attention deficit disorder) 04/24/2011   ADHD (attention deficit hyperactivity disorder)    Allergy    Anxiety    Benzodiazepine dependence (HCC) 05/12/2020   Bipolar disorder (HCC)    Bipolar disorder, current episode manic severe with psychotic features (HCC)    Bipolar I disorder (HCC) 05/12/2020   Constipation 04/03/2018   Delirium tremens (HCC) 05/20/2019   Depression    Eczema 12/11/13   Genital herpes simplex 10/24/2017   High risk heterosexual behavior 05/20/2019   MDD (major depressive disorder), recurrent severe, without psychosis (HCC) 02/11/2018   Morbid obesity (HCC) 09/06/2019   Pelvic pain 01/31/2018   Possible pregnancy 09/06/2019   Preventative health care 03/16/2013    Schizo-affective schizophrenia (HCC)    Schizoaffective disorder (HCC) 03/08/2018   Seizure (HCC) 05/20/2019   Severe bipolar I disorder, current or most recent episode depressed (HCC) 03/30/2017   STD exposure 05/13/2015   Syncope 03/03/2017   Vitamin D deficiency 03/10/2019    Patient Active Problem List   Diagnosis Date Noted   Gastroenteritis 06/13/2021   Bronchitis 04/18/2021   Seizure disorder (HCC) 10/25/2020   Thrush 10/25/2020   Amenorrhea 10/06/2020   Ear pain, left 10/06/2020   Schizo-affective schizophrenia (HCC)    Allergy    ADHD (attention deficit hyperactivity disorder)    Depression    Bipolar I disorder (HCC) 05/12/2020   Benzodiazepine dependence (HCC) 05/12/2020   Morbid obesity (HCC) 09/06/2019   Possible pregnancy 09/06/2019   Delirium tremens (HCC) 05/20/2019   Seizure (HCC) 05/20/2019   Acute vaginitis 05/20/2019   High risk heterosexual behavior 05/20/2019   Vitamin D deficiency 03/10/2019   Constipation 04/03/2018   Schizoaffective disorder (HCC) 03/08/2018   Bipolar disorder, current episode manic severe with psychotic features (HCC)    MDD (major depressive disorder), recurrent severe, without psychosis (HCC) 02/11/2018   Pelvic pain 01/31/2018   Genital herpes simplex 10/24/2017   Anxiety 08/30/2017   Severe bipolar I disorder, current or most recent episode depressed (HCC) 03/30/2017   Syncope 03/03/2017   STD exposure 05/13/2015   Eczema 12/11/2013  Birth control counseling 03/16/2013   Bipolar disorder (HCC) 12/30/2012   ADD (attention deficit disorder) 04/24/2011    No past surgical history on file.   OB History   No obstetric history on file.     Family History  Problem Relation Age of Onset   Asthma Mother    Stroke Father    Heart disease Father    Asthma Father    Hypertension Father        pulmonary hypertension   Heart disease Maternal Uncle    Hypertension Maternal Grandmother    COPD Maternal Grandmother    Diabetes  Maternal Grandfather    Stroke Paternal Grandmother    Diabetes Paternal Grandmother    Diabetes Paternal Grandfather    Leukemia Maternal Uncle     Social History   Tobacco Use   Smoking status: Never   Smokeless tobacco: Never  Vaping Use   Vaping Use: Former   Quit date: 09/01/2017  Substance Use Topics   Alcohol use: Not Currently    Comment: occasionally   Drug use: Not Currently    Home Medications Prior to Admission medications   Medication Sig Start Date End Date Taking? Authorizing Provider  cetirizine (ZYRTEC) 10 MG tablet Take 1/2 tablet (5 mg total) by mouth daily. 11/29/20   [name], DO  Cholecalciferol (VITAMIN D3) 1.25 MG (50000 UT) CAPS Take 1 capsule by mouth every week 06/14/21     mometasone (NASONEX) 50 MCG/ACT nasal spray Place 2 sprays into the nose daily as directed 04/18/21   Zola Button, Grayling Congress, DO  norgestimate-ethinyl estradiol (SPRINTEC 28) 0.25-35 MG-MCG tablet Take 1 tablet by mouth daily. 06/13/21   Seabron Spates R, DO  paliperidone Palmitate ER (INVEGA TRINZA) 819 MG/2.63ML SUSY injection Use as directed every 3 months 12/27/20     propranolol (INDERAL) 20 MG tablet Take 1 tablet by mouth 3 times a day *Please call to schedule next appt* 04/05/21     QUEtiapine (SEROQUEL XR) 400 MG 24 hr tablet TAKE 2 TABLETS BY MOUTH EVERY NIGHT AT BEDTIME Patient taking differently: Take 200 mg by mouth at bedtime. 07/14/20 07/14/21  Milagros Evener, MD  triamcinolone (KENALOG) 0.1 %  06/30/20   [provider]  valACYclovir (VALTREX) 1000 MG tablet Take 1 tablet (1,000 mg total) by mouth 2 (two) times daily. 06/21/21   [name], DO  metoprolol tartrate (LOPRESSOR) 25 MG tablet 1/2 tab po bid Patient not taking: Reported on 06/08/2020 05/26/20 06/08/20  Seabron Spates R, DO  OLANZapine zydis (ZYPREXA) 20 MG disintegrating tablet Take 1 tablet (20 mg total) by mouth at bedtime. Patient not taking: Reported on 06/08/2020 05/21/20  06/08/20  Karsten Ro, MD  paliperidone (INVEGA) 9 MG 24 hr tablet Take 1 tablet (9 mg total) by mouth at bedtime. Patient not taking: Reported on 06/08/2020 05/21/20 06/08/20  Karsten Ro, MD    Allergies    Concerta [methylphenidate], Terbinafine, Trazodone and nefazodone, Clindamycin hcl, Haldol [haloperidol], and Terbinafine hcl  Review of Systems   Review of Systems  Constitutional:  Negative for fever.  HENT:  Negative for sore throat.   Eyes:  Negative for visual disturbance.  Respiratory:  Negative for shortness of breath.   Cardiovascular:  Negative for chest pain.  Gastrointestinal:  Negative for abdominal pain.  Genitourinary:  Negative for dysuria.  Musculoskeletal:  Positive for gait problem. Negative for back pain and neck pain.  Skin:  Negative for rash.  Neurological:  Negative for headaches.   Physical Exam Updated Vital Signs BP 127/86 (BP Location: Right Arm)   Pulse 93   Temp 98.2 F (36.8 C) (Oral)   Resp 15   LMP 08/08/2020   SpO2 91%   Physical Exam Vitals and nursing note reviewed.  Constitutional:      General: She is not in acute distress.    Appearance: Normal appearance. She is well-developed.  HENT:     Head: Normocephalic and atraumatic.  Eyes:     Conjunctiva/sclera: Conjunctivae normal.  Cardiovascular:     Rate and Rhythm: Normal rate and regular rhythm.     Heart sounds: No murmur heard. Pulmonary:     Effort: Pulmonary effort is normal. No respiratory distress.     Breath sounds: Normal breath sounds.  Abdominal:     Palpations: Abdomen is soft.     Tenderness: There is no abdominal tenderness.  Musculoskeletal:        General: Tenderness present. Normal range of motion.     Cervical back: Neck supple.     Comments: Right ankle no medial malleolar tenderness.  Left distal fibular tenderness about 3 cm above the joint.  No proximal fibular tenderness.  Calcaneus and midfoot nontender.  Distal pulses and sensation motor intact.   Skin:    General: Skin is warm and dry.  Neurological:     General: No focal deficit present.     Mental Status: She is alert.    ED Results / Procedures / Treatments   Labs (all labs ordered are listed, but only abnormal results are displayed) Labs Reviewed - No data to display  EKG None  Radiology DG Ankle Complete Right  Result Date: 06/26/2021 CLINICAL DATA:  Ankle pain after fall. EXAM: RIGHT ANKLE - COMPLETE 3+ VIEW COMPARISON:  None. FINDINGS: There is no bleak fracture of the LATERAL malleolus, associated with minimal displacement. There is moderate soft tissue swelling of the LATERAL and anterior ankle. IMPRESSION: Oblique fracture of the LATERAL malleolus associated with soft tissue swelling. Electronically Signed   By: Norva Pavlov M.D.   On: 06/26/2021 13:27    Procedures Procedures   Medications Ordered in ED Medications  acetaminophen (TYLENOL) tablet 1,000 mg (1,000 mg Oral Given 06/26/21 1242)    ED Course  I have reviewed the triage vital signs and the nursing notes.  Pertinent labs & imaging results that were available during my care of the patient were reviewed by me and considered in my medical decision making (see chart for details).  Clinical Course as of 06/26/21 2250  Mon Jun 26, 2021  1713 Discussed with orthopedics Dr. Susa Simmonds.  He said walking boot weightbearing as tolerated and follow-up in the office for repeat x-rays. [MB]    Clinical Course User Index [MB] Terrilee Files, MD   MDM Rules/Calculators/A&P                          Differential diagnosis includes fracture, dislocation, contusion, sprain.  X-rays interpreted by me as oblique fracture of distal fibula above the tibial plafond. Mortise is symmetric.  Final Clinical Impression(s) / ED Diagnoses Final diagnoses:  Closed fracture of distal end of right fibula, unspecified fracture morphology, initial encounter    Rx / DC Orders ED Discharge Orders     None         Terrilee Files, MD 06/26/21 2251

## 2021-06-26 NOTE — Discharge Instructions (Signed)
You were seen in the emergency department for right ankle pain after a fall.  Your x-rays showed a distal fibular fracture.  Orthopedics Dr. Susa Simmonds is recommending a walking boot and follow-up in the office for repeat x-rays.  You can use Tylenol and ibuprofen for pain.  Ice.  Return to the emergency department if any worsening or concerning symptoms.

## 2021-06-26 NOTE — ED Notes (Signed)
Pt discharged with legal guardian Mom . Explained discharge paper to mom

## 2021-06-26 NOTE — ED Provider Notes (Signed)
Emergency Medicine Provider Triage Evaluation Note  OMELIA MARQUART , a 25 y.o. female  was evaluated in triage.  Pt complains of pain after she tripped and fell last night.  Her pain is made worse with weightbearing, no movements of her ankle.  She has not taken anything for her symptoms.  Denies any other trauma.  Review of Systems  Positive: Ankle pain Negative: Numbness, tingling  Physical Exam  BP 99/77   Pulse (!) 110   Temp 98.2 F (36.8 C) (Oral)   Resp 15   SpO2 97%  Gen:   Awake, no distress   Resp:  Normal effort  MSK:   Moves extremities without difficulty  Other:  Pulses equal and normal in BLE, diffuse swelling over the right ankle  Medical Decision Making  Medically screening exam initiated at 12:39 PM.  Appropriate orders placed.  AVICE FUNCHESS was informed that the remainder of the evaluation will be completed by another provider, this initial triage assessment does not replace that evaluation, and the importance of remaining in the ED until their evaluation is complete.     Jeanella Flattery 06/26/21 1239    Milagros Loll, MD 06/27/21 2241

## 2021-06-28 ENCOUNTER — Ambulatory Visit (INDEPENDENT_AMBULATORY_CARE_PROVIDER_SITE_OTHER): Payer: Medicare Other | Admitting: Orthopedic Surgery

## 2021-06-28 ENCOUNTER — Other Ambulatory Visit: Payer: Self-pay

## 2021-06-28 DIAGNOSIS — S82891A Other fracture of right lower leg, initial encounter for closed fracture: Secondary | ICD-10-CM

## 2021-06-29 ENCOUNTER — Encounter: Payer: Self-pay | Admitting: Orthopedic Surgery

## 2021-06-29 MED ORDER — ASPIRIN 81 MG PO CHEW
81.0000 mg | CHEWABLE_TABLET | Freq: Two times a day (BID) | ORAL | 0 refills | Status: AC
  Filled 2021-06-29: qty 72, 36d supply, fill #0

## 2021-06-29 NOTE — Progress Notes (Signed)
Office Visit Note   Patient: [name]           Date of Birth: 11-28-1995           MRN: [id number] Visit Date: 06/28/2021 Requested by: [address] [place], [place] [address] [place],  [place] [address] PCP: Zola Button, Grayling Congress, DO  Subjective: Chief Complaint  Patient presents with  . Right Ankle - Follow-up    HPI: [name] is a 25 y.o. female who presents to the office complaining of right ankle pain.  Patient states that she tripped down stairs on 06/26/2021 and injured her right ankle.  She was seen in the emergency department where she was diagnosed with lateral malleolus fracture of the right ankle.  She states all of her pain is over the lateral ankle with no medial sided pain.  She is taking Tylenol for pain control.  She is in a walker boot and has been avoiding weightbearing is much as possible but she has had occasionally to put some weight on it.  She denies any previous injury to her ankle.  Denies any foot or knee pain.  Denies any history of diabetes, smoking, kidney disease, DVT/PE in her family or personally.  Most of her medical history is psychiatric with history of bipolar 1 disorder but she states that this is currently controlled with medication.  She is on an oral contraceptive.  She has a history of vitamin D deficiency but she is currently taking 50,000 units of vitamin D weekly.              ROS: All systems reviewed are negative as they relate to the chief complaint within the history of present illness.  Patient denies fevers or chills.  Assessment & Plan: Visit Diagnoses:  1. Closed fracture of right ankle, initial encounter     Plan: Patient is a 25 year old female who presents for evaluation of right ankle injury.  She has sustained a lateral malleolus fracture after falling down stairs.  Radiographs do demonstrate good alignment of the fracture fragment with no disruption of the ankle mortise or any talar dome  lesions/anterior process the calcaneus fracture.  Discussed options available to patient including casting with nonweightbearing versus surgical intervention.  Do think that this has a chance to do well with nonoperative management and patient would like to try this first.  She was placed in a short leg cast that was well-padded.  Prescription given to her for knee scooter.  She will take aspirin 81 mg twice daily to limit risk of DVT/PE.  She was counseled on the signs and symptoms of DVT/PE and she will reach out to the office or go straight to the emergency department if she notices any of the symptoms.  Follow-up with the office in 7 to 10 days for clinical recheck with new radiographs at that time.  She understands that she is to be absolutely nonweightbearing and there is still a chance that the fracture alignment shifts and that would necessitate surgery.  Follow-up in 7 to 10 days.  Follow-Up Instructions: No follow-ups on file.   Orders:  No orders of the defined types were placed in this encounter.  No orders of the defined types were placed in this encounter.     Procedures: No procedures performed   Clinical Data: No additional findings.  Objective: Vital Signs: There were no vitals taken for this visit.  Physical Exam:  Constitutional: Patient appears  well-developed HEENT:  Head: Normocephalic Eyes:EOM are normal Neck: Normal range of motion Cardiovascular: Normal rate Pulmonary/chest: Effort normal Neurologic: Patient is alert Skin: Skin is warm Psychiatric: Patient has normal mood and affect  Ortho Exam: Ortho exam demonstrates right ankle with palpable pedal pulses.  Able to dorsiflex and plantarflex the ankle as well as evert/invert the ankle.  No difficulty with flexing extending her toes of the right foot.  EHL strength 5/5.  No tenderness over the medial malleolus or deltoid ligament.  No tenderness over the Achilles tendon with Achilles tendon palpable and intact  by palpation.  No tenderness over the fifth metatarsal base, Lisfranc complex, rest of the foot.  She does have tenderness and swelling with small amount of ecchymosis noted over the lateral malleolus.  No pain with syndesmotic squeeze.  Syndesmosis seems to be intact by stressing on exam today.  Specialty Comments:  No specialty comments available.  Imaging: No results found.   PMFS History: Patient Active Problem List   Diagnosis Date Noted  . Gastroenteritis 06/13/2021  . Bronchitis 04/18/2021  . Seizure disorder (HCC) 10/25/2020  . Thrush 10/25/2020  . Amenorrhea 10/06/2020  . Ear pain, left 10/06/2020  . Schizo-affective schizophrenia (HCC)   . Allergy   . ADHD (attention deficit hyperactivity disorder)   . Depression   . Bipolar I disorder (HCC) 05/12/2020  . Benzodiazepine dependence (HCC) 05/12/2020  . Morbid obesity (HCC) 09/06/2019  . Possible pregnancy 09/06/2019  . Delirium tremens (HCC) 05/20/2019  . Seizure (HCC) 05/20/2019  . Acute vaginitis 05/20/2019  . High risk heterosexual behavior 05/20/2019  . Vitamin D deficiency 03/10/2019  . Constipation 04/03/2018  . Schizoaffective disorder (HCC) 03/08/2018  . Bipolar disorder, current episode manic severe with psychotic features (HCC)   . MDD (major depressive disorder), recurrent severe, without psychosis (HCC) 02/11/2018  . Pelvic pain 01/31/2018  . Genital herpes simplex 10/24/2017  . Anxiety 08/30/2017  . Severe bipolar I disorder, current or most recent episode depressed (HCC) 03/30/2017  . Syncope 03/03/2017  . STD exposure 05/13/2015  . Eczema 12/11/2013  . Birth control counseling 03/16/2013  . Bipolar disorder (HCC) 12/30/2012  . ADD (attention deficit disorder) 04/24/2011   Past Medical History:  Diagnosis Date  . Acute vaginitis 05/20/2019  . ADD (attention deficit disorder) 04/24/2011  . ADHD (attention deficit hyperactivity disorder)   . Allergy   . Anxiety   . Benzodiazepine dependence (HCC)  05/12/2020  . Bipolar disorder (HCC)   . Bipolar disorder, current episode manic severe with psychotic features (HCC)   . Bipolar I disorder (HCC) 05/12/2020  . Constipation 04/03/2018  . Delirium tremens (HCC) 05/20/2019  . Depression   . Eczema 12/11/13  . Genital herpes simplex 10/24/2017  . High risk heterosexual behavior 05/20/2019  . MDD (major depressive disorder), recurrent severe, without psychosis (HCC) 02/11/2018  . Morbid obesity (HCC) 09/06/2019  . Pelvic pain 01/31/2018  . Possible pregnancy 09/06/2019  . Preventative health care 03/16/2013  . Schizo-affective schizophrenia (HCC)   . Schizoaffective disorder (HCC) 03/08/2018  . Seizure (HCC) 05/20/2019  . Severe bipolar I disorder, current or most recent episode depressed (HCC) 03/30/2017  . STD exposure 05/13/2015  . Syncope 03/03/2017  . Vitamin D deficiency 03/10/2019    Family History  Problem Relation Age of Onset  . Asthma Mother   . Stroke Father   . Heart disease Father   . Asthma Father   . Hypertension Father        pulmonary hypertension  .  Heart disease Maternal Uncle   . Hypertension Maternal Grandmother   . COPD Maternal Grandmother   . Diabetes Maternal Grandfather   . Stroke Paternal Grandmother   . Diabetes Paternal Grandmother   . Diabetes Paternal Grandfather   . Leukemia Maternal Uncle     No past surgical history on file. Social History   Occupational History  . Occupation: Unemployed  Tobacco Use  . Smoking status: Never  . Smokeless tobacco: Never  Vaping Use  . Vaping Use: Former  . Quit date: 09/01/2017  Substance and Sexual Activity  . Alcohol use: Not Currently    Comment: occasionally  . Drug use: Not Currently  . Sexual activity: Not Currently    Birth control/protection: Pill

## 2021-06-30 ENCOUNTER — Other Ambulatory Visit (HOSPITAL_BASED_OUTPATIENT_CLINIC_OR_DEPARTMENT_OTHER): Payer: Self-pay

## 2021-06-30 ENCOUNTER — Encounter: Payer: Self-pay | Admitting: Orthopedic Surgery

## 2021-07-03 ENCOUNTER — Telehealth: Payer: Self-pay | Admitting: Orthopedic Surgery

## 2021-07-03 NOTE — Telephone Encounter (Signed)
Patient's mother Elita Quick called advised patient has Covid-19 (3 days in) [name] asked when can the patient be seen again?   The number to contact [name] is [phone]

## 2021-07-03 NOTE — Telephone Encounter (Signed)
Tried calling. No answer. LMVM advising ideally would like for them to wait until at least 5-7 days from onset of symptoms, preferably closer to 1 week.

## 2021-07-05 ENCOUNTER — Ambulatory Visit: Payer: Medicare Other | Admitting: Orthopedic Surgery

## 2021-07-10 ENCOUNTER — Other Ambulatory Visit (HOSPITAL_BASED_OUTPATIENT_CLINIC_OR_DEPARTMENT_OTHER): Payer: Self-pay

## 2021-07-10 ENCOUNTER — Telehealth: Payer: Self-pay | Admitting: Orthopedic Surgery

## 2021-07-10 NOTE — Telephone Encounter (Signed)
Pts mother Elita Quick called stating the pt saw Dr.Dean in the hospital for her ankle and he originally worked her in for an appt on 07/05/21 to have her cast checked but the pt got sick and they had to cancel. [name] is wanting to be worked back into the schedule for this week and states Dr.Dean didn't want the pt to be in the cast for more than a week.  [phone]

## 2021-07-10 NOTE — Telephone Encounter (Signed)
Contacted patient's mother and she has been scheduled to see Dr. August Saucer on Wednesday 07/12/2021 at 3:00PM. Patient's mother had no further questions or concerns to discuss.

## 2021-07-12 ENCOUNTER — Ambulatory Visit (INDEPENDENT_AMBULATORY_CARE_PROVIDER_SITE_OTHER): Payer: Medicare Other | Admitting: Orthopedic Surgery

## 2021-07-12 ENCOUNTER — Ambulatory Visit (INDEPENDENT_AMBULATORY_CARE_PROVIDER_SITE_OTHER): Payer: Medicare Other

## 2021-07-12 ENCOUNTER — Other Ambulatory Visit: Payer: Self-pay

## 2021-07-12 DIAGNOSIS — S82891A Other fracture of right lower leg, initial encounter for closed fracture: Secondary | ICD-10-CM | POA: Diagnosis not present

## 2021-07-13 ENCOUNTER — Encounter: Payer: Self-pay | Admitting: Orthopedic Surgery

## 2021-07-13 NOTE — Progress Notes (Signed)
Post-fracture visit Note   Patient: [name]           Date of Birth: 01-08-1996           MRN: [id number] Visit Date: 07/12/2021 PCP: [name], DO   Assessment & Plan:  Chief Complaint:  Chief Complaint  Patient presents with   Right Ankle - Fracture, Follow-up   Visit Diagnoses:  1. Closed fracture of right ankle, initial encounter     Plan: Patient is a 25 year old female who presents for repeat evaluation of right ankle fracture.  Date of injury was 06/26/2021.  She reports today that she has been compliant with wearing the cast and not putting any weight on her injured extremity.  Pain is well controlled.  Taking aspirin twice per day to prevent DVT/PE.  Denies any chest pain, shortness of breath, calf pain.  On exam she has intact sensation to her toes, palpable DP pulse, intact extension and flexion of her toes.  There is no calf tenderness of the proximal calf that is palpable.  Radiographs of the right ankle demonstrate about 0.5 to 1 mm of shortening but no change in angulation of the fracture.  Plan to have patient return on Wednesday and we will remove cast and check x-rays out of the cast.  For now, we can continue with plan for nonoperative management but if there is any significant change on next Wednesday's radiographs, have to consider operative intervention.  Patient understands and agrees with plan.  Discussed this with the patient's sister as well.  Follow-up in 1 week.  Follow-Up Instructions: No follow-ups on file.   Orders:  Orders Placed This Encounter  Procedures   XR Ankle Complete Right   No orders of the defined types were placed in this encounter.   Imaging: No results found.  PMFS History: Patient Active Problem List   Diagnosis Date Noted   Gastroenteritis 06/13/2021   Bronchitis 04/18/2021   Seizure disorder (HCC) 10/25/2020   Thrush 10/25/2020   Amenorrhea 10/06/2020   Ear pain, left 10/06/2020   Schizo-affective  schizophrenia (HCC)    Allergy    ADHD (attention deficit hyperactivity disorder)    Depression    Bipolar I disorder (HCC) 05/12/2020   Benzodiazepine dependence (HCC) 05/12/2020   Morbid obesity (HCC) 09/06/2019   Possible pregnancy 09/06/2019   Delirium tremens (HCC) 05/20/2019   Seizure (HCC) 05/20/2019   Acute vaginitis 05/20/2019   High risk heterosexual behavior 05/20/2019   Vitamin D deficiency 03/10/2019   Constipation 04/03/2018   Schizoaffective disorder (HCC) 03/08/2018   Bipolar disorder, current episode manic severe with psychotic features (HCC)    MDD (major depressive disorder), recurrent severe, without psychosis (HCC) 02/11/2018   Pelvic pain 01/31/2018   Genital herpes simplex 10/24/2017   Anxiety 08/30/2017   Severe bipolar I disorder, current or most recent episode depressed (HCC) 03/30/2017   Syncope 03/03/2017   STD exposure 05/13/2015   Eczema 12/11/2013   Birth control counseling 03/16/2013   Bipolar disorder (HCC) 12/30/2012   ADD (attention deficit disorder) 04/24/2011   Past Medical History:  Diagnosis Date   Acute vaginitis 05/20/2019   ADD (attention deficit disorder) 04/24/2011   ADHD (attention deficit hyperactivity disorder)    Allergy    Anxiety    Benzodiazepine dependence (HCC) 05/12/2020   Bipolar disorder (HCC)    Bipolar disorder, current episode manic severe with psychotic features (HCC)    Bipolar I disorder (HCC) 05/12/2020   Constipation 04/03/2018  Delirium tremens (HCC) 05/20/2019   Depression    Eczema 12/11/13   Genital herpes simplex 10/24/2017   High risk heterosexual behavior 05/20/2019   MDD (major depressive disorder), recurrent severe, without psychosis (HCC) 02/11/2018   Morbid obesity (HCC) 09/06/2019   Pelvic pain 01/31/2018   Possible pregnancy 09/06/2019   Preventative health care 03/16/2013   Schizo-affective schizophrenia (HCC)    Schizoaffective disorder (HCC) 03/08/2018   Seizure (HCC) 05/20/2019   Severe bipolar I  disorder, current or most recent episode depressed (HCC) 03/30/2017   STD exposure 05/13/2015   Syncope 03/03/2017   Vitamin D deficiency 03/10/2019    Family History  Problem Relation Age of Onset   Asthma Mother    Stroke Father    Heart disease Father    Asthma Father    Hypertension Father        pulmonary hypertension   Heart disease Maternal Uncle    Hypertension Maternal Grandmother    COPD Maternal Grandmother    Diabetes Maternal Grandfather    Stroke Paternal Grandmother    Diabetes Paternal Grandmother    Diabetes Paternal Grandfather    Leukemia Maternal Uncle     No past surgical history on file. Social History   Occupational History   Occupation: Unemployed  Tobacco Use   Smoking status: Never   Smokeless tobacco: Never  Vaping Use   Vaping Use: Former   Quit date: 09/01/2017  Substance and Sexual Activity   Alcohol use: Not Currently    Comment: occasionally   Drug use: Not Currently   Sexual activity: Not Currently    Birth control/protection: Pill

## 2021-07-17 ENCOUNTER — Other Ambulatory Visit (HOSPITAL_BASED_OUTPATIENT_CLINIC_OR_DEPARTMENT_OTHER): Payer: Self-pay

## 2021-07-17 ENCOUNTER — Other Ambulatory Visit: Payer: Self-pay

## 2021-07-17 MED ORDER — QUETIAPINE FUMARATE ER 400 MG PO TB24
ORAL_TABLET | ORAL | 0 refills | Status: DC
  Filled 2021-07-17: qty 60, 30d supply, fill #0

## 2021-07-18 ENCOUNTER — Ambulatory Visit (INDEPENDENT_AMBULATORY_CARE_PROVIDER_SITE_OTHER): Payer: Medicare Other | Admitting: Family Medicine

## 2021-07-18 ENCOUNTER — Other Ambulatory Visit (HOSPITAL_COMMUNITY)
Admission: RE | Admit: 2021-07-18 | Discharge: 2021-07-18 | Disposition: A | Discharge status: Home / Self Care | Payer: Medicare Other | Source: Ambulatory Visit | Attending: Family Medicine | Admitting: Family Medicine

## 2021-07-18 ENCOUNTER — Other Ambulatory Visit (HOSPITAL_BASED_OUTPATIENT_CLINIC_OR_DEPARTMENT_OTHER): Payer: Self-pay

## 2021-07-18 ENCOUNTER — Encounter: Payer: Self-pay | Admitting: Family Medicine

## 2021-07-18 VITALS — BP 100/80 | HR 110 | Temp 98.9°F | Resp 20 | Ht 64.0 in

## 2021-07-18 DIAGNOSIS — Z7251 High risk heterosexual behavior: Secondary | ICD-10-CM

## 2021-07-18 DIAGNOSIS — F332 Major depressive disorder, recurrent severe without psychotic features: Secondary | ICD-10-CM

## 2021-07-18 DIAGNOSIS — R8290 Unspecified abnormal findings in urine: Secondary | ICD-10-CM

## 2021-07-18 DIAGNOSIS — Z309 Encounter for contraceptive management, unspecified: Secondary | ICD-10-CM | POA: Diagnosis not present

## 2021-07-18 DIAGNOSIS — Z114 Encounter for screening for human immunodeficiency virus [HIV]: Secondary | ICD-10-CM

## 2021-07-18 LAB — POC URINALSYSI DIPSTICK (AUTOMATED)
Glucose, UA: NEGATIVE
Nitrite, UA: NEGATIVE
Protein, UA: POSITIVE — AB
Spec Grav, UA: 1.025 (ref 1.010–1.025)
Urobilinogen, UA: 0.2 E.U./dL
pH, UA: 6 (ref 5.0–8.0)

## 2021-07-18 LAB — POCT URINE PREGNANCY: Preg Test, Ur: NEGATIVE

## 2021-07-18 MED ORDER — QUETIAPINE FUMARATE ER 400 MG PO TB24
800.0000 mg | ORAL_TABLET | Freq: Every day | ORAL | 11 refills | Status: DC
  Filled 2021-07-18: qty 60, 30d supply, fill #0

## 2021-07-18 MED ORDER — NORGESTIMATE-ETH ESTRADIOL 0.25-35 MG-MCG PO TABS
1.0000 | ORAL_TABLET | Freq: Every day | ORAL | 3 refills | Status: DC
  Filled 2021-07-18: qty 84, 84d supply, fill #0

## 2021-07-18 NOTE — Assessment & Plan Note (Signed)
Per psych She needs a new counselor  List of names and numbers given to the pt

## 2021-07-18 NOTE — Assessment & Plan Note (Signed)
Per psych 

## 2021-07-18 NOTE — Progress Notes (Signed)
Subjective:   By signing my name below, I, [name], attest that this documentation has been prepared under the direction and in the presence of Dr. Seabron Spates, DO. 07/18/2021   Patient ID: [name], female    DOB: Jun 01, 1996, 25 y.o.   MRN: [id number]  Chief Complaint  Patient presents with   sexual active    HPI Patient is in today for a office visit.  She complains of having vaginal odor and thinks her pH is off. She also changed her diet and thinks this may have contributed.  She is also requesting an STD screening and pregnancy test due to having unprotected sex.  She complains of coughing and thinks she may have developed bronchitis. She is coughing up clear mucous.    Past Medical History:  Diagnosis Date   Acute vaginitis 05/20/2019   ADD (attention deficit disorder) 04/24/2011   ADHD (attention deficit hyperactivity disorder)    Allergy    Anxiety    Benzodiazepine dependence (HCC) 05/12/2020   Bipolar disorder (HCC)    Bipolar disorder, current episode manic severe with psychotic features (HCC)    Bipolar I disorder (HCC) 05/12/2020   Constipation 04/03/2018   Delirium tremens (HCC) 05/20/2019   Depression    Eczema 12/11/13   Genital herpes simplex 10/24/2017   High risk heterosexual behavior 05/20/2019   MDD (major depressive disorder), recurrent severe, without psychosis (HCC) 02/11/2018   Morbid obesity (HCC) 09/06/2019   Pelvic pain 01/31/2018   Possible pregnancy 09/06/2019   Preventative health care 03/16/2013   Schizo-affective schizophrenia (HCC)    Schizoaffective disorder (HCC) 03/08/2018   Seizure (HCC) 05/20/2019   Severe bipolar I disorder, current or most recent episode depressed (HCC) 03/30/2017   STD exposure 05/13/2015   Syncope 03/03/2017   Vitamin D deficiency 03/10/2019    No past surgical history on file.  Family History  Problem Relation Age of Onset   Asthma Mother    Stroke Father    Heart disease Father    Asthma Father     Hypertension Father        pulmonary hypertension   Heart disease Maternal Uncle    Hypertension Maternal Grandmother    COPD Maternal Grandmother    Diabetes Maternal Grandfather    Stroke Paternal Grandmother    Diabetes Paternal Grandmother    Diabetes Paternal Grandfather    Leukemia Maternal Uncle     Social History   Socioeconomic History   Marital status: Single    Spouse name: Not on file   Number of children: Not on file   Years of education: Not on file   Highest education level: Not on file  Occupational History   Occupation: Unemployed  Tobacco Use   Smoking status: Never   Smokeless tobacco: Never  Vaping Use   Vaping Use: Former   Quit date: 09/01/2017  Substance and Sexual Activity   Alcohol use: Not Currently    Comment: occasionally   Drug use: Not Currently   Sexual activity: Not Currently    Birth control/protection: Pill  Other Topics Concern   Not on file  Social History Narrative   Lives with mom in an apartment on the second floor.  No children.  Currently not working.  Education: college. Left handed   Social Determinants of Health   Financial Resource Strain: Not on file  Food Insecurity: Not on file  Transportation Needs: Not on file  Physical Activity: Not on file  Stress: Not on  file  Social Connections: Not on file  Intimate Partner Violence: Not on file    Outpatient Medications Prior to Visit  Medication Sig Dispense Refill   aspirin (ASPIRIN CHILDRENS) 81 MG chewable tablet Chew 1 tablet (81 mg total) by mouth 2 (two) times daily. 72 tablet 0   cetirizine (ZYRTEC) 10 MG tablet Take 1/2 tablet (5 mg total) by mouth daily. 100 tablet 1   Cholecalciferol (VITAMIN D3) 1.25 MG (50000 UT) CAPS Take 1 capsule by mouth every week 4 capsule 0   mometasone (NASONEX) 50 MCG/ACT nasal spray Place 2 sprays into the nose daily as directed 17 g 12   norgestimate-ethinyl estradiol (SPRINTEC 28) 0.25-35 MG-MCG tablet Take 1 tablet by mouth daily.  28 tablet 11   paliperidone Palmitate ER (INVEGA TRINZA) 819 MG/2.63ML SUSY injection Use as directed every 3 months 1 mL 3   propranolol (INDERAL) 20 MG tablet Take 1 tablet by mouth 3 times a day *Please call to schedule next appt* 90 tablet 0   QUEtiapine (SEROQUEL XR) 400 MG 24 hr tablet Take 1&1/2 to 2 tablets by mouth at bedtime 60 tablet 0   QUEtiapine (SEROQUEL XR) 400 MG 24 hr tablet Take 2 tablets by mouth every night at bedtime 60 tablet 11   triamcinolone (KENALOG) 0.1 %      valACYclovir (VALTREX) 1000 MG tablet Take 1 tablet (1,000 mg total) by mouth 2 (two) times daily. 20 tablet 0   QUEtiapine (SEROQUEL XR) 400 MG 24 hr tablet TAKE 2 TABLETS BY MOUTH EVERY NIGHT AT BEDTIME (Patient taking differently: Take 200 mg by mouth at bedtime.) 60 tablet 11   No facility-administered medications prior to visit.    Allergies  Allergen Reactions   Concerta [Methylphenidate] Other (See Comments)    hallucinations   Terbinafine Other (See Comments)   Trazodone And Nefazodone Other (See Comments)    Pt stated she has nightmares   Clindamycin Hcl Rash   Haldol [Haloperidol] Anxiety    Unable to seat still   Terbinafine Hcl Nausea And Vomiting    Review of Systems  Constitutional:  Negative for fever and malaise/fatigue.  HENT:  Negative for congestion.   Eyes:  Negative for blurred vision.  Respiratory:  Positive for cough and sputum production (clear). Negative for shortness of breath.   Cardiovascular:  Negative for chest pain, palpitations and leg swelling.  Gastrointestinal:  Negative for abdominal pain, blood in stool and nausea.  Genitourinary:  Negative for dysuria and frequency.       (+)vaginal odor  Musculoskeletal:  Negative for falls.  Skin:  Negative for rash.  Neurological:  Negative for dizziness, loss of consciousness and headaches.  Endo/Heme/Allergies:  Negative for environmental allergies.  Psychiatric/Behavioral:  Negative for depression. The patient is not  nervous/anxious.       Objective:    Physical Exam Vitals and nursing note reviewed.  Constitutional:      Appearance: She is well-developed.  HENT:     Head: Normocephalic and atraumatic.  Eyes:     Conjunctiva/sclera: Conjunctivae normal.  Neck:     Thyroid: No thyromegaly.     Vascular: No carotid bruit or JVD.  Cardiovascular:     Rate and Rhythm: Normal rate and regular rhythm.     Heart sounds: Normal heart sounds. No murmur heard. Pulmonary:     Effort: Pulmonary effort is normal. No respiratory distress.     Breath sounds: Normal breath sounds. No wheezing or rales.  Chest:  Chest wall: No tenderness.  Musculoskeletal:     Cervical back: Normal range of motion and neck supple.  Neurological:     Mental Status: She is alert and oriented to person, place, and time.    BP 100/80 (BP Location: Right Arm, Patient Position: Sitting, Cuff Size: Large)   Pulse (!) 110   Temp 98.9 F (37.2 C) (Oral)   Resp 20   Ht 5\' 4"  (1.626 m)   LMP 07/18/2021   SpO2 97%   BMI 41.20 kg/m  Wt Readings from Last 3 Encounters:  06/23/21 240 lb (108.9 kg)  05/09/21 238 lb (108 kg)  04/18/21 236 lb 3.2 oz (107.1 kg)    Diabetic Foot Exam - Simple   No data filed    Lab Results  Component Value Date   WBC 5.6 10/06/2020   HGB 13.1 10/06/2020   HCT 39.1 10/06/2020   PLT 293.0 10/06/2020   GLUCOSE 93 10/06/2020   CHOL 196 10/06/2020   TRIG 100.0 10/06/2020   HDL 54.30 10/06/2020   LDLCALC 121 (H) 10/06/2020   ALT 15 10/06/2020   AST 13 10/06/2020   NA 137 10/06/2020   K 4.6 10/06/2020   CL 103 10/06/2020   CREATININE 0.78 10/06/2020   BUN 17 10/06/2020   CO2 28 10/06/2020   TSH 1.33 10/06/2020   HGBA1C 5.0 07/02/2020   MICROALBUR 0.6 08/02/2014    Lab Results  Component Value Date   TSH 1.33 10/06/2020   Lab Results  Component Value Date   WBC 5.6 10/06/2020   HGB 13.1 10/06/2020   HCT 39.1 10/06/2020   MCV 89.1 10/06/2020   PLT 293.0 10/06/2020   Lab  Results  Component Value Date   NA 137 10/06/2020   K 4.6 10/06/2020   CO2 28 10/06/2020   GLUCOSE 93 10/06/2020   BUN 17 10/06/2020   CREATININE 0.78 10/06/2020   BILITOT 0.5 10/06/2020   ALKPHOS 75 10/06/2020   AST 13 10/06/2020   ALT 15 10/06/2020   PROT 7.1 10/06/2020   ALBUMIN 4.2 10/06/2020   CALCIUM 9.6 10/06/2020   ANIONGAP 12 07/02/2020   GFR 104.64 10/06/2020   Lab Results  Component Value Date   CHOL 196 10/06/2020   Lab Results  Component Value Date   HDL 54.30 10/06/2020   Lab Results  Component Value Date   LDLCALC 121 (H) 10/06/2020   Lab Results  Component Value Date   TRIG 100.0 10/06/2020   Lab Results  Component Value Date   CHOLHDL 4 10/06/2020   Lab Results  Component Value Date   HGBA1C 5.0 07/02/2020       Assessment & Plan:   Problem List Items Addressed This Visit       Unprioritized   Encounter for contraceptive management   Relevant Medications   norgestimate-ethinyl estradiol (SPRINTEC 28) 0.25-35 MG-MCG tablet   Other Relevant Orders   HIV Antibody (routine testing w rflx)   RPR   High risk sexual behavior - Primary    preg neg genprobe done by self swab Check labs   Start bcp       Relevant Orders   POCT Urinalysis Dipstick (Automated) (Completed)   POCT urine pregnancy (Completed)   HIV Antibody (routine testing w rflx)   RPR   Cervicovaginal ancillary only( [facility])   MDD (major depressive disorder), recurrent severe, without psychosis (HCC)    Per psych She needs a new counselor  List of names and numbers given to  the pt      Other Visit Diagnoses     Screening for human immunodeficiency virus       Relevant Orders   HIV Antibody (routine testing w rflx)   Abnormal urine       Relevant Orders   Urine Culture        Meds ordered this encounter  Medications   norgestimate-ethinyl estradiol (SPRINTEC 28) 0.25-35 MG-MCG tablet    Sig: Take 1 tablet by mouth daily.    Dispense:  84 tablet     Refill:  3    I, Dr. Seabron Spates, DO, personally preformed the services described in this documentation.  All medical record entries made by the scribe were at my direction and in my presence.  I have reviewed the chart and discharge instructions (if applicable) and agree that the record reflects my personal performance and is accurate and complete. 07/18/2021   I,[name],acting as a scribe for [name], DO.,have documented all relevant documentation on the behalf of [name], DO,as directed by  [name], DO while in the presence of [name], DO.   [name], DO

## 2021-07-18 NOTE — Patient Instructions (Signed)
Preventing Sexually Transmitted Infections, Adult  Sexually transmitted infections (STIs) are diseases that are spread from person to person (are contagious). They are spread, or transmitted, through bodily fluids exchanged during sex or sexual contact. These bodily fluids include saliva, semen, blood, vaginal mucus, and urine. STIs are very common among people of all ages.  Some common STIs include:  Herpes.  Hepatitis B.  Chlamydia.  Gonorrhea.  Syphilis.  HPV (human papillomavirus).  HIV, also called the human immunodeficiency virus. This is the virus that can cause AIDS (acquired immunodeficiency syndrome).  Often, people who have these STIs do not have symptoms. Even without symptoms, these infections can be spread from person to person and require treatment.  How can these conditions affect me?  STIs can be treated, and many STIs can be cured. However, some STIs cannot be cured and will affect you for the rest of your life.  Certain STIs may:  Require you to take medicine for the rest of your life.  Affect your ability to have children (your fertility).  Increase your risk for developing another STI or certain serious health conditions. These may include:  Cervical cancer.  Head and neck cancer.  Pelvic inflammatory disease (PID), in women.  Organ damage or damage to other parts of your body, if the infection spreads.  Cause problems during pregnancy and may be transmitted to the baby during the pregnancy or childbirth.  What can increase my risk?  You may have an increased risk for developing an STI if:  You have unprotected sex. Sex includes oral, vaginal, or anal sex.  You have more than one sex partner.  You have a sex partner who has multiple sex partners.  You have sex with someone who has an STI.  You have an STI or you had an STI before.  You inject drugs or have a sex partner or partners who inject drugs.  What actions can I take to prevent STIs?  The only way to completely prevent STIs is not to have  sex of any kind. This is called practicing abstinence. If you are sexually active, you can protect yourself and others by taking these actions to lower your risk of getting an STI:  Lifestyle  Avoid mixing alcohol, drugs, and sex. Alcohol and drug use can affect your ability to make good decisions and can lead to risky sexual behaviors.  Medicines  Ask your health care provider about taking pre-exposure prophylaxis (PrEP) to prevent HIV infection.  General information    Stay up to date on vaccinations. Certain vaccines can lower your risk of getting certain STIs, such as:  Hepatitis A and hepatitis B vaccines. You may have been vaccinated as a young child, but you will likely need a booster shot as a teen or young adult.  HPV (human papillomavirus) vaccine.  Have only one sex partner (be monogamous) or limit the number of sex partners you have.  Use methods that prevent the exchange of body fluids between partners (barrier protection) correctly every time you have sex. Barrier protection can be used during oral, vaginal, or anal sex. Commonly used barrier methods include:  Female condom.  Female condom.  Dental dam.  Use a new condom for every sex act from start to finish.  Get tested for STIs. Have your partners get tested, too.  If you test positive for an STI, follow recommendations from your health care provider about treatment and make sure your sex partners are tested and treated.  Birth control   pills, injections, implants, and intrauterine devices (IUDs) do not protect against STIs. To prevent both STIs and pregnancy, always use a condom with another form of birth control.  Some STIs, such as herpes, are spread through skin-to-skin contact. A condom may not protect you from getting such STIs. Avoid all sexual contact if you or your partners have herpes and there is an active flare with open sores.  Where to find more information  Learn more about STIs from:  Centers for Disease Control and Prevention:  More  information about specific STIs: cdc.gov/std  Places to get sexual health counseling and treatment for free or at a low cost: gettested.cdc.gov  [place] Department of Health and Human Services: www.womenshealth.gov  Summary  Sexually transmitted infections (STIs) can spread through exchanging bodily fluids during sexual contact. Fluids include saliva, semen, blood, vaginal mucus, and urine.  You may have an increased risk for developing an STI if you have unprotected sex. Sex includes oral, vaginal, or anal sex.  If you do have sex, limit your number of sex partners and use barrier protection every time you have sex.  This information is not intended to replace advice given to you by your health care provider. Make sure you discuss any questions you have with your health care provider.  Document Revised: 09/14/2019 Document Reviewed: 09/14/2019  Elsevier Patient Education  2022 [employer] Inc[employer]

## 2021-07-18 NOTE — Assessment & Plan Note (Signed)
preg neg genprobe done by self swab Check labs   Start bcp

## 2021-07-19 ENCOUNTER — Other Ambulatory Visit: Payer: Self-pay

## 2021-07-19 ENCOUNTER — Other Ambulatory Visit (HOSPITAL_COMMUNITY): Payer: Self-pay

## 2021-07-19 ENCOUNTER — Ambulatory Visit (INDEPENDENT_AMBULATORY_CARE_PROVIDER_SITE_OTHER): Payer: No Typology Code available for payment source

## 2021-07-19 ENCOUNTER — Other Ambulatory Visit (HOSPITAL_BASED_OUTPATIENT_CLINIC_OR_DEPARTMENT_OTHER): Payer: Self-pay

## 2021-07-19 ENCOUNTER — Ambulatory Visit (INDEPENDENT_AMBULATORY_CARE_PROVIDER_SITE_OTHER): Payer: Medicare Other | Admitting: Orthopedic Surgery

## 2021-07-19 DIAGNOSIS — S82891A Other fracture of right lower leg, initial encounter for closed fracture: Secondary | ICD-10-CM | POA: Diagnosis not present

## 2021-07-19 LAB — URINE CULTURE
MICRO NUMBER:: 12720374
SPECIMEN QUALITY:: ADEQUATE

## 2021-07-19 LAB — HIV ANTIBODY (ROUTINE TESTING W REFLEX): HIV 1&2 Ab, 4th Generation: NONREACTIVE

## 2021-07-19 LAB — RPR: RPR Ser Ql: NONREACTIVE

## 2021-07-20 ENCOUNTER — Other Ambulatory Visit (HOSPITAL_COMMUNITY): Payer: Self-pay

## 2021-07-20 LAB — CERVICOVAGINAL ANCILLARY ONLY
Bacterial Vaginitis (gardnerella): NEGATIVE
Candida Glabrata: NEGATIVE
Candida Vaginitis: POSITIVE — AB
Chlamydia: NEGATIVE
Comment: NEGATIVE
Comment: NEGATIVE
Comment: NEGATIVE
Comment: NEGATIVE
Comment: NEGATIVE
Comment: NORMAL
Neisseria Gonorrhea: NEGATIVE
Trichomonas: NEGATIVE

## 2021-07-20 MED ORDER — CHOLECALCIFEROL 1.25 MG (50000 UT) PO CAPS
50000.0000 [IU] | ORAL_CAPSULE | ORAL | 0 refills | Status: DC
  Filled 2021-07-20: qty 4, 28d supply, fill #0

## 2021-07-21 ENCOUNTER — Other Ambulatory Visit: Payer: Self-pay

## 2021-07-21 ENCOUNTER — Other Ambulatory Visit (HOSPITAL_BASED_OUTPATIENT_CLINIC_OR_DEPARTMENT_OTHER): Payer: Self-pay

## 2021-07-21 MED ORDER — FLUCONAZOLE 150 MG PO TABS
150.0000 mg | ORAL_TABLET | Freq: Once | ORAL | 0 refills | Status: AC
  Filled 2021-07-21: qty 2, 2d supply, fill #0

## 2021-07-24 ENCOUNTER — Other Ambulatory Visit: Payer: Self-pay

## 2021-07-24 ENCOUNTER — Other Ambulatory Visit (HOSPITAL_BASED_OUTPATIENT_CLINIC_OR_DEPARTMENT_OTHER): Payer: Self-pay

## 2021-07-24 ENCOUNTER — Ambulatory Visit (HOSPITAL_COMMUNITY): Payer: Medicare Other

## 2021-07-24 ENCOUNTER — Telehealth: Payer: Self-pay | Admitting: Surgical

## 2021-07-24 ENCOUNTER — Telehealth: Payer: Self-pay | Admitting: Orthopedic Surgery

## 2021-07-24 DIAGNOSIS — S82891A Other fracture of right lower leg, initial encounter for closed fracture: Secondary | ICD-10-CM

## 2021-07-24 NOTE — Telephone Encounter (Signed)
I have talked with [name] is arranging for patient and will reach out to patient.

## 2021-07-24 NOTE — Telephone Encounter (Signed)
Pt is scheduled for tomorrow morning at 9am at [facility] vascular [facility]. Mom is aware of appt

## 2021-07-24 NOTE — Telephone Encounter (Signed)
Pt's mother Elita Quick is asking for the call back about daughter's condition. She is asking for DR. Dean or PA Nadine to call her at [phone].

## 2021-07-24 NOTE — Telephone Encounter (Signed)
Patient's mother [name] called asked if when will the Ultra sound be scheduled for her daughter? [name] said patient's right thigh and calf is warm to the touch. [name] said the thigh and calf are swollen. The number to contact [name] is [phone]

## 2021-07-25 ENCOUNTER — Other Ambulatory Visit: Payer: Self-pay

## 2021-07-25 ENCOUNTER — Ambulatory Visit (HOSPITAL_COMMUNITY)
Admission: RE | Admit: 2021-07-25 | Discharge: 2021-07-25 | Disposition: A | Discharge status: Home / Self Care | Payer: No Typology Code available for payment source | Source: Ambulatory Visit | Attending: Orthopedic Surgery | Admitting: Orthopedic Surgery

## 2021-07-25 DIAGNOSIS — S82891A Other fracture of right lower leg, initial encounter for closed fracture: Secondary | ICD-10-CM | POA: Diagnosis present

## 2021-07-25 NOTE — Progress Notes (Signed)
Lower extremity venous has been completed.   Preliminary results in CV Proc.   [name] [name] 07/25/2021 9:22 AM

## 2021-07-26 ENCOUNTER — Encounter: Payer: Self-pay | Admitting: Orthopedic Surgery

## 2021-07-26 NOTE — Progress Notes (Signed)
Post-Op Visit Note   Patient: [name]           Date of Birth: Nov 09, 1995           MRN: [id number] Visit Date: 07/19/2021 PCP: [name], DO   Assessment & Plan:  Chief Complaint:  Chief Complaint  Patient presents with   Right Ankle - Fracture, Follow-up    DOI:06/26/21   Visit Diagnoses:  1. Closed fracture of right ankle, initial encounter     Plan: Patient presents now status post right ankle fracture.  She is 4 weeks out from injury.On exam she has mild calf tenderness.  Ankle range of motion is improving.  Medial lateral translation slightly increased right versus left.  Radiographs do show some callus formation at the superior aspect of the fracture with no shortening of the fibula.  Plan at this time is ultrasound to rule out DVT based on calf pain and swelling.  That study is negative at the time of this dictation.  Continue nonweightbearing but work on ankle range of motion exercises.  Okay to be in the fracture boot.  Plan for 2-week return with fluoroscopic examination of the ankle at that time to try to determine whether or not she needs CT scan to evaluate for delayed union.  She does have a history of vitamin D deficiency and she has supplementation which she was prescribed but it is unclear whether she is actually taking the vitamin D.  Follow-Up Instructions: No follow-ups on file.   Orders:  Orders Placed This Encounter  Procedures   XR Ankle Complete Right   No orders of the defined types were placed in this encounter.   Imaging: VAS [name] LOWER EXTREMITY VENOUS (DVT)  Result Date: 07/25/2021  Lower Venous DVT Study Patient Name:  [name]  Date of Exam:   07/25/2021 Medical Rec #: [id number]          Accession #:    [id number] Date of Birth: 1995-08-16          Patient Gender: F Patient Age:   25 years Exam Location:  [facility] Procedure:      VAS [name] LOWER EXTREMITY VENOUS (DVT) Referring Phys: Rise Paganini  --------------------------------------------------------------------------------  Indications: Pain.  Comparison Study: no prior Performing Technologist: [place] Ponder RVS  Examination Guidelines: A complete evaluation includes B-mode imaging, spectral Doppler, color Doppler, and power Doppler as needed of all accessible portions of each vessel. Bilateral testing is considered an integral part of a complete examination. Limited examinations for reoccurring indications may be performed as noted. The reflux portion of the exam is performed with the patient in reverse Trendelenburg.  +---------+---------------+---------+-----------+----------+--------------+  RIGHT     Compressibility Phasicity Spontaneity Properties Thrombus Aging  +---------+---------------+---------+-----------+----------+--------------+  CFV       Full            Yes       Yes                                    +---------+---------------+---------+-----------+----------+--------------+  SFJ       Full                                                             +---------+---------------+---------+-----------+----------+--------------+  FV Prox   Full                                                             +---------+---------------+---------+-----------+----------+--------------+  FV Mid    Full                                                             +---------+---------------+---------+-----------+----------+--------------+  FV Distal Full                                                             +---------+---------------+---------+-----------+----------+--------------+  PFV       Full                                                             +---------+---------------+---------+-----------+----------+--------------+  POP       Full            Yes       Yes                                    +---------+---------------+---------+-----------+----------+--------------+  PTV       Full                                                              +---------+---------------+---------+-----------+----------+--------------+  PERO      Full                                                             +---------+---------------+---------+-----------+----------+--------------+   +----+---------------+---------+-----------+----------+--------------+  LEFT Compressibility Phasicity Spontaneity Properties Thrombus Aging  +----+---------------+---------+-----------+----------+--------------+  CFV  Full            Yes       Yes                                    +----+---------------+---------+-----------+----------+--------------+     Summary: RIGHT: - There is no evidence of deep vein thrombosis in the lower extremity.  - No cystic structure found in the popliteal fossa.  LEFT: - No evidence of common femoral vein obstruction.  *See table(s) above for measurements and observations. Electronically signed by Waverly Ferrari MD on 07/25/2021  at 10:15:21 AM.    Final     PMFS History: Patient Active Problem List   Diagnosis Date Noted   Gastroenteritis 06/13/2021   Bronchitis 04/18/2021   Seizure disorder (HCC) 10/25/2020   Thrush 10/25/2020   Amenorrhea 10/06/2020   Ear pain, left 10/06/2020   Schizo-affective schizophrenia (HCC)    Allergy    ADHD (attention deficit hyperactivity disorder)    Depression    Bipolar I disorder (HCC) 05/12/2020   Benzodiazepine dependence (HCC) 05/12/2020   Morbid obesity (HCC) 09/06/2019   Possible pregnancy 09/06/2019   Delirium tremens (HCC) 05/20/2019   Seizure (HCC) 05/20/2019   Acute vaginitis 05/20/2019   High risk sexual behavior 05/20/2019   Vitamin D deficiency 03/10/2019   Constipation 04/03/2018   Schizoaffective disorder (HCC) 03/08/2018   Bipolar disorder, current episode manic severe with psychotic features (HCC)    MDD (major depressive disorder), recurrent severe, without psychosis (HCC) 02/11/2018   Pelvic pain 01/31/2018   Genital herpes simplex 10/24/2017   Anxiety 08/30/2017    Severe bipolar I disorder, current or most recent episode depressed (HCC) 03/30/2017   Syncope 03/03/2017   STD exposure 05/13/2015   Eczema 12/11/2013   Encounter for contraceptive management 03/16/2013   Bipolar disorder (HCC) 12/30/2012   ADD (attention deficit disorder) 04/24/2011   Past Medical History:  Diagnosis Date   Acute vaginitis 05/20/2019   ADD (attention deficit disorder) 04/24/2011   ADHD (attention deficit hyperactivity disorder)    Allergy    Anxiety    Benzodiazepine dependence (HCC) 05/12/2020   Bipolar disorder (HCC)    Bipolar disorder, current episode manic severe with psychotic features (HCC)    Bipolar I disorder (HCC) 05/12/2020   Constipation 04/03/2018   Delirium tremens (HCC) 05/20/2019   Depression    Eczema 12/11/13   Genital herpes simplex 10/24/2017   High risk heterosexual behavior 05/20/2019   MDD (major depressive disorder), recurrent severe, without psychosis (HCC) 02/11/2018   Morbid obesity (HCC) 09/06/2019   Pelvic pain 01/31/2018   Possible pregnancy 09/06/2019   Preventative health care 03/16/2013   Schizo-affective schizophrenia (HCC)    Schizoaffective disorder (HCC) 03/08/2018   Seizure (HCC) 05/20/2019   Severe bipolar I disorder, current or most recent episode depressed (HCC) 03/30/2017   STD exposure 05/13/2015   Syncope 03/03/2017   Vitamin D deficiency 03/10/2019    Family History  Problem Relation Age of Onset   Asthma Mother    Stroke Father    Heart disease Father    Asthma Father    Hypertension Father        pulmonary hypertension   Heart disease Maternal Uncle    Hypertension Maternal Grandmother    COPD Maternal Grandmother    Diabetes Maternal Grandfather    Stroke Paternal Grandmother    Diabetes Paternal Grandmother    Diabetes Paternal Grandfather    Leukemia Maternal Uncle     No past surgical history on file. Social History   Occupational History   Occupation: Unemployed  Tobacco Use   Smoking status: Never    Smokeless tobacco: Never  Vaping Use   Vaping Use: Former   Quit date: 09/01/2017  Substance and Sexual Activity   Alcohol use: Not Currently    Comment: occasionally   Drug use: Not Currently   Sexual activity: Not Currently    Birth control/protection: Pill

## 2021-08-01 ENCOUNTER — Other Ambulatory Visit (HOSPITAL_BASED_OUTPATIENT_CLINIC_OR_DEPARTMENT_OTHER): Payer: Self-pay

## 2021-08-01 MED ORDER — QUETIAPINE FUMARATE 100 MG PO TABS
100.0000 mg | ORAL_TABLET | Freq: Every day | ORAL | 0 refills | Status: DC
  Filled 2021-08-01 – 2021-08-02 (×2): qty 30, 30d supply, fill #0

## 2021-08-02 ENCOUNTER — Other Ambulatory Visit (HOSPITAL_COMMUNITY): Payer: Self-pay

## 2021-08-02 ENCOUNTER — Ambulatory Visit (INDEPENDENT_AMBULATORY_CARE_PROVIDER_SITE_OTHER): Payer: Medicare Other | Admitting: Orthopedic Surgery

## 2021-08-02 ENCOUNTER — Other Ambulatory Visit: Payer: Self-pay

## 2021-08-02 ENCOUNTER — Other Ambulatory Visit (HOSPITAL_BASED_OUTPATIENT_CLINIC_OR_DEPARTMENT_OTHER): Payer: Self-pay

## 2021-08-02 DIAGNOSIS — S82891A Other fracture of right lower leg, initial encounter for closed fracture: Secondary | ICD-10-CM

## 2021-08-02 MED ORDER — LORAZEPAM 1 MG PO TABS
1.0000 mg | ORAL_TABLET | Freq: Four times a day (QID) | ORAL | 0 refills | Status: DC
  Filled 2021-08-02: qty 120, 30d supply, fill #0
  Filled 2021-10-03: qty 120, 30d supply, fill #1
  Filled 2021-11-23: qty 120, 30d supply, fill #2

## 2021-08-03 ENCOUNTER — Ambulatory Visit (HOSPITAL_BASED_OUTPATIENT_CLINIC_OR_DEPARTMENT_OTHER)
Admission: RE | Admit: 2021-08-03 | Discharge: 2021-08-03 | Disposition: A | Discharge status: Home / Self Care | Payer: Medicare Other | Source: Ambulatory Visit | Attending: Family | Admitting: Family

## 2021-08-03 ENCOUNTER — Other Ambulatory Visit (HOSPITAL_COMMUNITY): Payer: Self-pay

## 2021-08-03 ENCOUNTER — Ambulatory Visit (INDEPENDENT_AMBULATORY_CARE_PROVIDER_SITE_OTHER): Payer: No Typology Code available for payment source | Admitting: Family

## 2021-08-03 VITALS — BP 100/60 | HR 91 | Temp 98.1°F | Ht 64.0 in | Wt 239.0 lb

## 2021-08-03 DIAGNOSIS — R053 Chronic cough: Secondary | ICD-10-CM | POA: Insufficient documentation

## 2021-08-03 MED ORDER — AMOXICILLIN-POT CLAVULANATE 875-125 MG PO TABS
1.0000 | ORAL_TABLET | Freq: Two times a day (BID) | ORAL | 0 refills | Status: AC
  Filled 2021-08-03: qty 20, 10d supply, fill #0

## 2021-08-03 MED ORDER — BENZONATATE 100 MG PO CAPS
100.0000 mg | ORAL_CAPSULE | Freq: Three times a day (TID) | ORAL | 0 refills | Status: DC | PRN
  Filled 2021-08-03: qty 20, 7d supply, fill #0

## 2021-08-03 NOTE — Progress Notes (Signed)
VANNAH NADAL is a 25 y.o. female with the following history as recorded in [employer]:  Patient Active Problem List   Diagnosis Date Noted   Gastroenteritis 06/13/2021   Bronchitis 04/18/2021   Seizure disorder (HCC) 10/25/2020   Thrush 10/25/2020   Amenorrhea 10/06/2020   Ear pain, left 10/06/2020   Schizo-affective schizophrenia (HCC)    Allergy    ADHD (attention deficit hyperactivity disorder)    Depression    Bipolar I disorder (HCC) 05/12/2020   Benzodiazepine dependence (HCC) 05/12/2020   Morbid obesity (HCC) 09/06/2019   Possible pregnancy 09/06/2019   Delirium tremens (HCC) 05/20/2019   Seizure (HCC) 05/20/2019   Acute vaginitis 05/20/2019   High risk sexual behavior 05/20/2019   Vitamin D deficiency 03/10/2019   Constipation 04/03/2018   Schizoaffective disorder (HCC) 03/08/2018   Bipolar disorder, current episode manic severe with psychotic features (HCC)    MDD (major depressive disorder), recurrent severe, without psychosis (HCC) 02/11/2018   Pelvic pain 01/31/2018   Genital herpes simplex 10/24/2017   Anxiety 08/30/2017   Severe bipolar I disorder, current or most recent episode depressed (HCC) 03/30/2017   Syncope 03/03/2017   STD exposure 05/13/2015   Eczema 12/11/2013   Encounter for contraceptive management 03/16/2013   Bipolar disorder (HCC) 12/30/2012   ADD (attention deficit disorder) 04/24/2011    Current Outpatient Medications  Medication Sig Dispense Refill   amoxicillin-clavulanate (AUGMENTIN) 875-125 MG tablet Take 1 tablet by mouth 2 (two) times daily for 10 days. 20 tablet 0   aspirin EC 81 MG tablet Take 81 mg by mouth 2 (two) times daily. Swallow whole.     benzonatate (TESSALON) 100 MG capsule Take 1 capsule (100 mg total) by mouth 3 (three) times daily as needed. 20 capsule 0   cetirizine (ZYRTEC) 10 MG tablet Take 1/2 tablet (5 mg total) by mouth daily. 100 tablet 1   Cholecalciferol 1.25 MG (50000 UT) capsule Take 1 capsule (50,000  Units total) by mouth once a week. 4 capsule 0   LORazepam (ATIVAN) 1 MG tablet TAKE 1 TABLET BY MOUTH 4 TIMES DAILY. 360 tablet 0   norgestimate-ethinyl estradiol (SPRINTEC 28) 0.25-35 MG-MCG tablet Take 1 tablet by mouth daily. 28 tablet 11   paliperidone Palmitate ER (INVEGA TRINZA) 819 MG/2.63ML SUSY injection Use as directed every 3 months 1 mL 3   propranolol (INDERAL) 20 MG tablet Take 1 tablet by mouth 3 times a day *Please call to schedule next appt* 90 tablet 0   QUEtiapine (SEROQUEL) 100 MG tablet Take 1 tablet (100 mg total) by mouth at bedtime. 30 tablet 0   triamcinolone (KENALOG) 0.1 %      valACYclovir (VALTREX) 1000 MG tablet Take 1 tablet (1,000 mg total) by mouth 2 (two) times daily. 20 tablet 0   mometasone (NASONEX) 50 MCG/ACT nasal spray Place 2 sprays into the nose daily as directed (Patient not taking: Reported on 08/03/2021) 17 g 12   QUEtiapine (SEROQUEL XR) 400 MG 24 hr tablet TAKE 2 TABLETS BY MOUTH EVERY NIGHT AT BEDTIME (Patient taking differently: Take 200 mg by mouth at bedtime.) 60 tablet 11   QUEtiapine (SEROQUEL XR) 400 MG 24 hr tablet Take 1&1/2 to 2 tablets by mouth at bedtime (Patient not taking: Reported on 08/03/2021) 60 tablet 0   QUEtiapine (SEROQUEL XR) 400 MG 24 hr tablet Take 2 tablets by mouth every night at bedtime (Patient not taking: Reported on 08/03/2021) 60 tablet 11   No current facility-administered medications for this visit.  Allergies: Concerta [methylphenidate], Terbinafine, Trazodone and nefazodone, Clindamycin hcl, Haldol [haloperidol], and Terbinafine hcl  Past Medical History:  Diagnosis Date   Acute vaginitis 05/20/2019   ADD (attention deficit disorder) 04/24/2011   ADHD (attention deficit hyperactivity disorder)    Allergy    Anxiety    Benzodiazepine dependence (HCC) 05/12/2020   Bipolar disorder (HCC)    Bipolar disorder, current episode manic severe with psychotic features (HCC)    Bipolar I disorder (HCC) 05/12/2020    Constipation 04/03/2018   Delirium tremens (HCC) 05/20/2019   Depression    Eczema 12/11/13   Genital herpes simplex 10/24/2017   High risk heterosexual behavior 05/20/2019   MDD (major depressive disorder), recurrent severe, without psychosis (HCC) 02/11/2018   Morbid obesity (HCC) 09/06/2019   Pelvic pain 01/31/2018   Possible pregnancy 09/06/2019   Preventative health care 03/16/2013   Schizo-affective schizophrenia (HCC)    Schizoaffective disorder (HCC) 03/08/2018   Seizure (HCC) 05/20/2019   Severe bipolar I disorder, current or most recent episode depressed (HCC) 03/30/2017   STD exposure 05/13/2015   Syncope 03/03/2017   Vitamin D deficiency 03/10/2019    No past surgical history on file.  Family History  Problem Relation Age of Onset   Asthma Mother    Stroke Father    Heart disease Father    Asthma Father    Hypertension Father        pulmonary hypertension   Heart disease Maternal Uncle    Hypertension Maternal Grandmother    COPD Maternal Grandmother    Diabetes Maternal Grandfather    Stroke Paternal Grandmother    Diabetes Paternal Grandmother    Diabetes Paternal Grandfather    Leukemia Maternal Uncle     Social History   Tobacco Use   Smoking status: Never   Smokeless tobacco: Never  Substance Use Topics   Alcohol use: Not Currently    Comment: occasionally    Subjective:  Patient was diagnosed with COVID and bronchitis at the end of September; was treated with Z-pak and prednisone and felt that symptoms resolved completely; However, in past 5-6 weeks, she feels that the cough has returned; denies any fever, SOB or wheezing; feels that cough is worse at night and describes as a "deep cough." Denies any burping or belching; no history of asthma, allergies or GERD;  Does feel that the cough started before her ankle fracture- was recently evaluated for possible DVT which was negative;  She did have a normal CXR at the end of September;     Objective:  Vitals:    08/03/21 1043  BP: 100/60  Pulse: 91  Temp: 98.1 F (36.7 C)  TempSrc: Oral  SpO2: 97%  Weight: 239 lb (108.4 kg)  Height: 5\' 4"  (1.626 m)    General: Well developed, well nourished, in no acute distress  Skin : Warm and dry.  Head: Normocephalic and atraumatic  Eyes: Sclera and conjunctiva clear; pupils round and reactive to light; extraocular movements intact  Ears: External normal; canals clear; tympanic membranes normal  Oropharynx: Pink, supple. No suspicious lesions  Neck: Supple without thyromegaly, adenopathy  Lungs: Respirations unlabored; clear to auscultation bilaterally without wheeze, rales, rhonchi  CVS exam: normal rate and regular rhythm.  Neurologic: Alert and oriented; speech intact; face symmetrical; moves all extremities well; CNII-XII intact without focal deficit   Assessment:  1. Persistent cough for 3 weeks or longer     Plan:  Physical exam is reassuring; GERD needs to be considered but  will go ahead and treat for possible infection since some post-nasal drainage is noted; repeat CXR today; patient understands to start using her Nasonex; If symptoms persist or do not respond, to consider PPI and/or referral to pulmonology;  This visit occurred during the SARS-CoV-2 public health emergency.  Safety protocols were in place, including screening questions prior to the visit, additional usage of staff PPE, and extensive cleaning of exam room while observing appropriate contact time as indicated for disinfecting solutions.    No follow-ups on file.  Orders Placed This Encounter  Procedures   DG Chest 2 View    Standing Status:   Future    Number of Occurrences:   1    Standing Expiration Date:   08/03/2022    Order Specific Question:   Reason for Exam (SYMPTOM  OR DIAGNOSIS REQUIRED)    Answer:   cough x 5 weeks    Order Specific Question:   Is patient pregnant?    Answer:   Yes    Order Specific Question:   Preferred imaging location?    Answer:    [facility]    Requested Prescriptions   Signed Prescriptions Disp Refills   amoxicillin-clavulanate (AUGMENTIN) 875-125 MG tablet 20 tablet 0    Sig: Take 1 tablet by mouth 2 (two) times daily for 10 days.   benzonatate (TESSALON) 100 MG capsule 20 capsule 0    Sig: Take 1 capsule (100 mg total) by mouth 3 (three) times daily as needed.

## 2021-08-03 NOTE — Patient Instructions (Signed)
Please start using your Nasonex nasal spray as we discussed. I would like you to get another CXR today due to the length of time that your symptoms have been present.  I am going to put you Augmentin to take for 10 days and add Tessalon Perles to help break the cough cycle.

## 2021-08-07 ENCOUNTER — Encounter: Payer: Self-pay | Admitting: Orthopedic Surgery

## 2021-08-07 NOTE — Progress Notes (Signed)
Post-Op Visit Note   Patient: [name]           Date of Birth: 21-Jul-1996           MRN: [id number] Visit Date: 08/02/2021 PCP: [name], DO   Assessment & Plan:  Chief Complaint:  Chief Complaint  Patient presents with   Right Ankle - Fracture, Follow-up   Visit Diagnoses:  1. Closed fracture of right ankle, initial encounter    Plan: Patient presents for follow-up of right ankle fracture.  Date of injury 06/26/2021.  She has been nonweightbearing in a fracture boot.  On exam minimal tenderness in left swelling around the lateral malleolus and present last clinic visit.  Fluoroscopic exam demonstrates stable mortise.  Some callus formation is present at the superior aspect of the fracture.  Overall displacement/separation of the oblique fracture is about 2-1/2 mm.  Plan at this time is weightbearing as tolerated in fracture boot for 2 weeks followed by weightbearing as tolerated out of the fracture boot.  Her healing has been slightly delayed due to vitamin D deficiency but that is currently being supplemented.  Follow-up with [name] in 4 weeks for clinical recheck and possible radiographs if she has any residual pain remaining.  Follow-Up Instructions: Return in about 4 weeks (around 08/30/2021).   Orders:  No orders of the defined types were placed in this encounter.  No orders of the defined types were placed in this encounter.   Imaging: No results found.  PMFS History: Patient Active Problem List   Diagnosis Date Noted   Gastroenteritis 06/13/2021   Bronchitis 04/18/2021   Seizure disorder (HCC) 10/25/2020   Thrush 10/25/2020   Amenorrhea 10/06/2020   Ear pain, left 10/06/2020   Schizo-affective schizophrenia (HCC)    Allergy    ADHD (attention deficit hyperactivity disorder)    Depression    Bipolar I disorder (HCC) 05/12/2020   Benzodiazepine dependence (HCC) 05/12/2020   Morbid obesity (HCC) 09/06/2019   Possible pregnancy 09/06/2019    Delirium tremens (HCC) 05/20/2019   Seizure (HCC) 05/20/2019   Acute vaginitis 05/20/2019   High risk sexual behavior 05/20/2019   Vitamin D deficiency 03/10/2019   Constipation 04/03/2018   Schizoaffective disorder (HCC) 03/08/2018   Bipolar disorder, current episode manic severe with psychotic features (HCC)    MDD (major depressive disorder), recurrent severe, without psychosis (HCC) 02/11/2018   Pelvic pain 01/31/2018   Genital herpes simplex 10/24/2017   Anxiety 08/30/2017   Severe bipolar I disorder, current or most recent episode depressed (HCC) 03/30/2017   Syncope 03/03/2017   STD exposure 05/13/2015   Eczema 12/11/2013   Encounter for contraceptive management 03/16/2013   Bipolar disorder (HCC) 12/30/2012   ADD (attention deficit disorder) 04/24/2011   Past Medical History:  Diagnosis Date   Acute vaginitis 05/20/2019   ADD (attention deficit disorder) 04/24/2011   ADHD (attention deficit hyperactivity disorder)    Allergy    Anxiety    Benzodiazepine dependence (HCC) 05/12/2020   Bipolar disorder (HCC)    Bipolar disorder, current episode manic severe with psychotic features (HCC)    Bipolar I disorder (HCC) 05/12/2020   Constipation 04/03/2018   Delirium tremens (HCC) 05/20/2019   Depression    Eczema 12/11/13   Genital herpes simplex 10/24/2017   High risk heterosexual behavior 05/20/2019   MDD (major depressive disorder), recurrent severe, without psychosis (HCC) 02/11/2018   Morbid obesity (HCC) 09/06/2019   Pelvic pain 01/31/2018   Possible pregnancy 09/06/2019  Preventative health care 03/16/2013   Schizo-affective schizophrenia (HCC)    Schizoaffective disorder (HCC) 03/08/2018   Seizure (HCC) 05/20/2019   Severe bipolar I disorder, current or most recent episode depressed (HCC) 03/30/2017   STD exposure 05/13/2015   Syncope 03/03/2017   Vitamin D deficiency 03/10/2019    Family History  Problem Relation Age of Onset   Asthma Mother    Stroke Father    Heart disease  Father    Asthma Father    Hypertension Father        pulmonary hypertension   Heart disease Maternal Uncle    Hypertension Maternal Grandmother    COPD Maternal Grandmother    Diabetes Maternal Grandfather    Stroke Paternal Grandmother    Diabetes Paternal Grandmother    Diabetes Paternal Grandfather    Leukemia Maternal Uncle     History reviewed. No pertinent surgical history. Social History   Occupational History   Occupation: Unemployed  Tobacco Use   Smoking status: Never   Smokeless tobacco: Never  Vaping Use   Vaping Use: Former   Quit date: 09/01/2017  Substance and Sexual Activity   Alcohol use: Not Currently    Comment: occasionally   Drug use: Not Currently   Sexual activity: Not Currently    Birth control/protection: Pill

## 2021-08-07 NOTE — Progress Notes (Signed)
Post-Op Visit Note   Patient: [name]           Date of Birth: [birth year]           MRN: [id number] Visit Date: 08/02/2021 PCP: [name], DO   Assessment & Plan:  Chief Complaint:  Chief Complaint  Patient presents with   Right Ankle - Fracture, Follow-up   Visit Diagnoses:  1. Closed fracture of right ankle, initial encounter     Plan: Patient presents for follow-up of right ankle pain.  Date of injury 06/26/2021.  She had a right ankle lateral malleolus fracture at that time.  Also noted to have restarted taking her vitamin D for known vitamin D deficiency.  Pain has improved since last clinic visit.  On exam minimal tenderness on the right lateral malleolus.  Fluoroscopic examination demonstrates stable mortise.  Also appears to be a slight amount of callus formation superiorly with no further fracture displacement.  Overall the displacement or separation within this oblique fracture is about to 2-1/2 mm.  Plan at this time is weightbearing as tolerated in fracture boot for 2 weeks followed by weightbearing as tolerated out of the fracture boot.  Come back in 4 weeks for clinical recheck.  Although her healing has been delayed I think with the necessary supplementation of vitamin D she should heal this fracture without  Follow-Up Instructions: Return in about 4 weeks (around 08/30/2021).   Orders:  No orders of the defined types were placed in this encounter.  No orders of the defined types were placed in this encounter.   Imaging: No results found.  PMFS History: Patient Active Problem List   Diagnosis Date Noted   Gastroenteritis 06/13/2021   Bronchitis 04/18/2021   Seizure disorder (HCC) 10/25/2020   Thrush 10/25/2020   Amenorrhea 10/06/2020   Ear pain, left 10/06/2020   Schizo-affective schizophrenia (HCC)    Allergy    ADHD (attention deficit hyperactivity disorder)    Depression    Bipolar I disorder (HCC) 05/12/2020   Benzodiazepine  dependence (HCC) 05/12/2020   Morbid obesity (HCC) 09/06/2019   Possible pregnancy 09/06/2019   Delirium tremens (HCC) 05/20/2019   Seizure (HCC) 05/20/2019   Acute vaginitis 05/20/2019   High risk sexual behavior 05/20/2019   Vitamin D deficiency 03/10/2019   Constipation 04/03/2018   Schizoaffective disorder (HCC) 03/08/2018   Bipolar disorder, current episode manic severe with psychotic features (HCC)    MDD (major depressive disorder), recurrent severe, without psychosis (HCC) 02/11/2018   Pelvic pain 01/31/2018   Genital herpes simplex 10/24/2017   Anxiety 08/30/2017   Severe bipolar I disorder, current or most recent episode depressed (HCC) 03/30/2017   Syncope 03/03/2017   STD exposure 05/13/2015   Eczema 12/11/2013   Encounter for contraceptive management 03/16/2013   Bipolar disorder (HCC) 12/30/2012   ADD (attention deficit disorder) 04/24/2011   Past Medical History:  Diagnosis Date   Acute vaginitis 05/20/2019   ADD (attention deficit disorder) 04/24/2011   ADHD (attention deficit hyperactivity disorder)    Allergy    Anxiety    Benzodiazepine dependence (HCC) 05/12/2020   Bipolar disorder (HCC)    Bipolar disorder, current episode manic severe with psychotic features (HCC)    Bipolar I disorder (HCC) 05/12/2020   Constipation 04/03/2018   Delirium tremens (HCC) 05/20/2019   Depression    Eczema 12/11/13   Genital herpes simplex 10/24/2017   High risk heterosexual behavior 05/20/2019   MDD (major depressive disorder), recurrent severe,  without psychosis (HCC) 02/11/2018   Morbid obesity (HCC) 09/06/2019   Pelvic pain 01/31/2018   Possible pregnancy 09/06/2019   Preventative health care 03/16/2013   Schizo-affective schizophrenia (HCC)    Schizoaffective disorder (HCC) 03/08/2018   Seizure (HCC) 05/20/2019   Severe bipolar I disorder, current or most recent episode depressed (HCC) 03/30/2017   STD exposure 05/13/2015   Syncope 03/03/2017   Vitamin D deficiency 03/10/2019     Family History  Problem Relation Age of Onset   Asthma Mother    Stroke Father    Heart disease Father    Asthma Father    Hypertension Father        pulmonary hypertension   Heart disease Maternal Uncle    Hypertension Maternal Grandmother    COPD Maternal Grandmother    Diabetes Maternal Grandfather    Stroke Paternal Grandmother    Diabetes Paternal Grandmother    Diabetes Paternal Grandfather    Leukemia Maternal Uncle     History reviewed. No pertinent surgical history. Social History   Occupational History   Occupation: Unemployed  Tobacco Use   Smoking status: Never   Smokeless tobacco: Never  Vaping Use   Vaping Use: Former   Quit date: 09/01/2017  Substance and Sexual Activity   Alcohol use: Not Currently    Comment: occasionally   Drug use: Not Currently   Sexual activity: Not Currently    Birth control/protection: Pill

## 2021-08-14 ENCOUNTER — Other Ambulatory Visit (HOSPITAL_COMMUNITY): Payer: Self-pay

## 2021-08-15 ENCOUNTER — Other Ambulatory Visit (HOSPITAL_COMMUNITY): Payer: Self-pay

## 2021-08-15 MED ORDER — CHOLECALCIFEROL 1.25 MG (50000 UT) PO CAPS
ORAL_CAPSULE | ORAL | 2 refills | Status: DC
  Filled 2021-08-15: qty 4, 28d supply, fill #0

## 2021-08-16 ENCOUNTER — Other Ambulatory Visit: Payer: Self-pay | Admitting: Family

## 2021-08-16 ENCOUNTER — Other Ambulatory Visit (HOSPITAL_COMMUNITY): Payer: Self-pay

## 2021-08-16 MED ORDER — BENZONATATE 100 MG PO CAPS
100.0000 mg | ORAL_CAPSULE | Freq: Three times a day (TID) | ORAL | 0 refills | Status: DC | PRN
  Filled 2021-08-16: qty 20, 7d supply, fill #0

## 2021-08-17 ENCOUNTER — Encounter: Payer: Self-pay | Admitting: Orthopedic Surgery

## 2021-08-17 ENCOUNTER — Ambulatory Visit (INDEPENDENT_AMBULATORY_CARE_PROVIDER_SITE_OTHER): Payer: Medicare Other | Admitting: Surgical

## 2021-08-17 ENCOUNTER — Ambulatory Visit (INDEPENDENT_AMBULATORY_CARE_PROVIDER_SITE_OTHER): Payer: No Typology Code available for payment source

## 2021-08-17 ENCOUNTER — Other Ambulatory Visit: Payer: Self-pay

## 2021-08-17 DIAGNOSIS — S82891A Other fracture of right lower leg, initial encounter for closed fracture: Secondary | ICD-10-CM | POA: Diagnosis not present

## 2021-08-17 NOTE — Progress Notes (Signed)
Post-Op Visit Note   Patient: [name]           Date of Birth: 1995-09-04           MRN: [id number] Visit Date: 08/17/2021 PCP: Ann Held, DO   Assessment & Plan:  Chief Complaint:  Chief Complaint  Patient presents with   Right Ankle - Follow-up, Fracture   Visit Diagnoses:  1. Closed fracture of right ankle, initial encounter     Plan: Patient is a 26 year old female who presents for repeat evaluation of right ankle fracture.  Date of injury was 06/26/2021.  She has been ambulating full weightbearing in the fracture boot for the last several weeks.  She does walk without the boot at times.  Taking aspirin twice daily for DVT prophylaxis.  Denies any complaint of chest pain, shortness of breath, calf pain.  Not taking any other medications for pain control.  She feels no pain with ambulation.  Denies any significant swelling that she has noticed.  No mechanical symptoms in the ankle.  No instability.  On exam, patient has no tenderness over the fracture site.  No calf tenderness.  Negative Homans' sign.  About 10 degrees of passive dorsiflexion of the right ankle.  Intact active dorsiflexion and plantarflexion.  Palpable DP pulse rated 2+  Plan is to transition from fracture boot to regular shoe with follow-up in 2 weeks for clinical recheck with new radiographs at the time.  Radiographs today of the right ankle demonstrate increased callus formation compared with prior x-rays with no change in the position of the fracture.  Ankle mortise is well-maintained.  She will contact the office with any concerns in the next 2 weeks if she notices increased pain with ambulation.  Also recommended she take vitamin D 2000 units daily now that she has stopped her 50,000 unit supplement.  Follow-Up Instructions: No follow-ups on file.   Orders:  Orders Placed This Encounter  Procedures   XR Ankle Complete Right   No orders of the defined types were placed in this  encounter.   Imaging: No results found.  PMFS History: Patient Active Problem List   Diagnosis Date Noted   Gastroenteritis 06/13/2021   Bronchitis 04/18/2021   Seizure disorder ([place]) 10/25/2020   Thrush 10/25/2020   Amenorrhea 10/06/2020   Ear pain, left 10/06/2020   Schizo-affective schizophrenia ([place])    Allergy    ADHD (attention deficit hyperactivity disorder)    Depression    Bipolar I disorder ([place]) 05/12/2020   Benzodiazepine dependence ([place]) 05/12/2020   Morbid obesity ([place]) 09/06/2019   Possible pregnancy 09/06/2019   Delirium tremens ([place]) 05/20/2019   Seizure ([place]) 05/20/2019   Acute vaginitis 05/20/2019   High risk sexual behavior 05/20/2019   Vitamin D deficiency 03/10/2019   Constipation 04/03/2018   Schizoaffective disorder ([place]) 03/08/2018   Bipolar disorder, current episode manic severe with psychotic features ([place])    MDD (major depressive disorder), recurrent severe, without psychosis ([place]) 02/11/2018   Pelvic pain 01/31/2018   Genital herpes simplex 10/24/2017   Anxiety 08/30/2017   Severe bipolar I disorder, current or most recent episode depressed ([place]) 03/30/2017   Syncope 03/03/2017   STD exposure 05/13/2015   Eczema 12/11/2013   Encounter for contraceptive management 03/16/2013   Bipolar disorder ([place]) 12/30/2012   ADD (attention deficit disorder) 04/24/2011   Past Medical History:  Diagnosis Date   Acute vaginitis 05/20/2019   ADD (attention deficit disorder) 04/24/2011   ADHD (attention  deficit hyperactivity disorder)    Allergy    Anxiety    Benzodiazepine dependence ([place]) 05/12/2020   Bipolar disorder ([place])    Bipolar disorder, current episode manic severe with psychotic features ([place])    Bipolar I disorder ([place]) 05/12/2020   Constipation 04/03/2018   Delirium tremens ([place]) 05/20/2019   Depression    Eczema 12/11/13   Genital herpes simplex 10/24/2017   High risk heterosexual behavior 05/20/2019   MDD (major depressive disorder),  recurrent severe, without psychosis ([place]) 02/11/2018   Morbid obesity ([place]) 09/06/2019   Pelvic pain 01/31/2018   Possible pregnancy 09/06/2019   Preventative health care 03/16/2013   Schizo-affective schizophrenia ([place])    Schizoaffective disorder ([place]) 03/08/2018   Seizure ([place]) 05/20/2019   Severe bipolar I disorder, current or most recent episode depressed ([place]) 03/30/2017   STD exposure 05/13/2015   Syncope 03/03/2017   Vitamin D deficiency 03/10/2019    Family History  Problem Relation Age of Onset   Asthma Mother    Stroke Father    Heart disease Father    Asthma Father    Hypertension Father        pulmonary hypertension   Heart disease Maternal Uncle    Hypertension Maternal Grandmother    COPD Maternal Grandmother    Diabetes Maternal Grandfather    Stroke Paternal Grandmother    Diabetes Paternal Grandmother    Diabetes Paternal Grandfather    Leukemia Maternal Uncle     No past surgical history on file. Social History   Occupational History   Occupation: Unemployed  Tobacco Use   Smoking status: Never   Smokeless tobacco: Never  Vaping Use   Vaping Use: Former   Quit date: 09/01/2017  Substance and Sexual Activity   Alcohol use: Not Currently    Comment: occasionally   Drug use: Not Currently   Sexual activity: Not Currently    Birth control/protection: Pill

## 2021-08-21 ENCOUNTER — Other Ambulatory Visit (HOSPITAL_BASED_OUTPATIENT_CLINIC_OR_DEPARTMENT_OTHER): Payer: Self-pay

## 2021-08-21 ENCOUNTER — Other Ambulatory Visit: Payer: Self-pay | Admitting: Family Medicine

## 2021-08-21 DIAGNOSIS — T7840XA Allergy, unspecified, initial encounter: Secondary | ICD-10-CM

## 2021-08-22 ENCOUNTER — Other Ambulatory Visit: Payer: Self-pay | Admitting: Family Medicine

## 2021-08-22 MED ORDER — IPRATROPIUM BROMIDE 0.03 % NA SOLN
2.0000 | Freq: Two times a day (BID) | NASAL | 12 refills | Status: DC
  Filled 2021-08-22: qty 30, 75d supply, fill #0
  Filled 2021-08-31: qty 30, 80d supply, fill #0

## 2021-08-22 NOTE — Telephone Encounter (Signed)
Mometasone (Nasonex) not covered, requesting ipratropium bromide nasal spray or astelin. Please advise.

## 2021-08-23 ENCOUNTER — Other Ambulatory Visit (HOSPITAL_BASED_OUTPATIENT_CLINIC_OR_DEPARTMENT_OTHER): Payer: Self-pay

## 2021-08-23 ENCOUNTER — Other Ambulatory Visit (HOSPITAL_COMMUNITY): Payer: Self-pay

## 2021-08-23 NOTE — Telephone Encounter (Signed)
Rx changed to ipratropium nasal spray.

## 2021-08-31 ENCOUNTER — Telehealth: Payer: Self-pay | Admitting: Cardiology

## 2021-08-31 ENCOUNTER — Other Ambulatory Visit (HOSPITAL_BASED_OUTPATIENT_CLINIC_OR_DEPARTMENT_OTHER): Payer: Self-pay

## 2021-08-31 ENCOUNTER — Other Ambulatory Visit (HOSPITAL_COMMUNITY): Payer: Self-pay

## 2021-08-31 MED ORDER — PROPRANOLOL HCL 20 MG PO TABS
20.0000 mg | ORAL_TABLET | Freq: Three times a day (TID) | ORAL | 0 refills | Status: DC
  Filled 2021-08-31 – 2021-09-01 (×2): qty 90, 30d supply, fill #0

## 2021-08-31 NOTE — Telephone Encounter (Signed)
Refill sent as requested. 

## 2021-08-31 NOTE — Telephone Encounter (Signed)
° ° °*  STAT* If patient is at the pharmacy, call can be transferred to refill team.   1. Which medications need to be refilled? (please list name of each medication and dose if known) propranolol (INDERAL) 20 MG tablet  2. Which pharmacy/location (including street and city if local pharmacy) is medication to be sent to? [facility]  3. Do they need a 30 day or 90 day supply? 90 days  Pt is out of meds needs refill today

## 2021-09-01 ENCOUNTER — Ambulatory Visit (INDEPENDENT_AMBULATORY_CARE_PROVIDER_SITE_OTHER): Payer: No Typology Code available for payment source

## 2021-09-01 ENCOUNTER — Ambulatory Visit (INDEPENDENT_AMBULATORY_CARE_PROVIDER_SITE_OTHER): Payer: No Typology Code available for payment source | Admitting: Orthopedic Surgery

## 2021-09-01 ENCOUNTER — Other Ambulatory Visit (HOSPITAL_BASED_OUTPATIENT_CLINIC_OR_DEPARTMENT_OTHER): Payer: Self-pay

## 2021-09-01 ENCOUNTER — Other Ambulatory Visit (HOSPITAL_COMMUNITY): Payer: Self-pay

## 2021-09-01 ENCOUNTER — Other Ambulatory Visit: Payer: Self-pay

## 2021-09-01 DIAGNOSIS — S82891A Other fracture of right lower leg, initial encounter for closed fracture: Secondary | ICD-10-CM

## 2021-09-03 ENCOUNTER — Encounter: Payer: Self-pay | Admitting: Orthopedic Surgery

## 2021-09-03 NOTE — Progress Notes (Signed)
Post-Op Visit Note   Patient: [name]           Date of Birth: 24-Nov-1995           MRN: [id number] Visit Date: 09/01/2021 PCP: Ann Held, DO   Assessment & Plan:  Chief Complaint:  Chief Complaint  Patient presents with   Right Ankle - Follow-up    right ankle fracture DOI: 06/26/2021   Visit Diagnoses:  1. Closed fracture of right ankle, initial encounter     Plan: Patient presents now 2 months out right ankle lateral malleolus fracture.  She is been weightbearing with regular shoe.  She has very little pain.  Stairs are okay for her.  She wants to do in-house workouts.  On examination she has excellent ankle dorsiflexion plantarflexion strength and range of motion on the right versus left.  No calf tenderness negative Homans.  No real lateral sided malleolar tenderness or medial malleolar tenderness present.  About 2 mm more medial to lateral laxity on the right compared to the left but with good endpoint and no evidence of syndesmotic instability.  Plan at this time is continue weightbearing as tolerated.  I do not want her doing any jumping but other recreational activities encouraged.  6-week return for final check.  I do not think we will need radiographs at that time but it is more clinical recheck and if she is having any symptoms then we can consider radiographs then.  Follow-Up Instructions: Return in about 6 weeks (around 10/13/2021).   Orders:  Orders Placed This Encounter  Procedures   XR Ankle Complete Right   No orders of the defined types were placed in this encounter.   Imaging: No results found.  PMFS History: Patient Active Problem List   Diagnosis Date Noted   Gastroenteritis 06/13/2021   Bronchitis 04/18/2021   Seizure disorder ([place]) 10/25/2020   Thrush 10/25/2020   Amenorrhea 10/06/2020   Ear pain, left 10/06/2020   Schizo-affective schizophrenia ([place])    Allergy    ADHD (attention deficit hyperactivity disorder)     Depression    Bipolar I disorder ([place]) 05/12/2020   Benzodiazepine dependence ([place]) 05/12/2020   Morbid obesity ([place]) 09/06/2019   Possible pregnancy 09/06/2019   Delirium tremens ([place]) 05/20/2019   Seizure ([place]) 05/20/2019   Acute vaginitis 05/20/2019   High risk sexual behavior 05/20/2019   Vitamin D deficiency 03/10/2019   Constipation 04/03/2018   Schizoaffective disorder ([place]) 03/08/2018   Bipolar disorder, current episode manic severe with psychotic features ([place])    MDD (major depressive disorder), recurrent severe, without psychosis ([place]) 02/11/2018   Pelvic pain 01/31/2018   Genital herpes simplex 10/24/2017   Anxiety 08/30/2017   Severe bipolar I disorder, current or most recent episode depressed ([place]) 03/30/2017   Syncope 03/03/2017   STD exposure 05/13/2015   Eczema 12/11/2013   Encounter for contraceptive management 03/16/2013   Bipolar disorder ([place]) 12/30/2012   ADD (attention deficit disorder) 04/24/2011   Past Medical History:  Diagnosis Date   Acute vaginitis 05/20/2019   ADD (attention deficit disorder) 04/24/2011   ADHD (attention deficit hyperactivity disorder)    Allergy    Anxiety    Benzodiazepine dependence ([place]) 05/12/2020   Bipolar disorder ([place])    Bipolar disorder, current episode manic severe with psychotic features ([place])    Bipolar I disorder ([place]) 05/12/2020   Constipation 04/03/2018   Delirium tremens ([place]) 05/20/2019   Depression    Eczema 12/11/13  Genital herpes simplex 10/24/2017   High risk heterosexual behavior 05/20/2019   MDD (major depressive disorder), recurrent severe, without psychosis ([place]) 02/11/2018   Morbid obesity ([place]) 09/06/2019   Pelvic pain 01/31/2018   Possible pregnancy 09/06/2019   Preventative health care 03/16/2013   Schizo-affective schizophrenia ([place])    Schizoaffective disorder ([place]) 03/08/2018   Seizure ([place]) 05/20/2019   Severe bipolar I disorder, current or most recent episode depressed ([place]) 03/30/2017   STD exposure  05/13/2015   Syncope 03/03/2017   Vitamin D deficiency 03/10/2019    Family History  Problem Relation Age of Onset   Asthma Mother    Stroke Father    Heart disease Father    Asthma Father    Hypertension Father        pulmonary hypertension   Heart disease Maternal Uncle    Hypertension Maternal Grandmother    COPD Maternal Grandmother    Diabetes Maternal Grandfather    Stroke Paternal Grandmother    Diabetes Paternal Grandmother    Diabetes Paternal Grandfather    Leukemia Maternal Uncle     History reviewed. No pertinent surgical history. Social History   Occupational History   Occupation: Unemployed  Tobacco Use   Smoking status: Never   Smokeless tobacco: Never  Vaping Use   Vaping Use: Former   Quit date: 09/01/2017  Substance and Sexual Activity   Alcohol use: Not Currently    Comment: occasionally   Drug use: Not Currently   Sexual activity: Not Currently    Birth control/protection: Pill

## 2021-09-19 ENCOUNTER — Other Ambulatory Visit (HOSPITAL_COMMUNITY): Payer: Self-pay

## 2021-09-19 MED ORDER — INVEGA TRINZA 819 MG/2.63ML IM SUSY
PREFILLED_SYRINGE | INTRAMUSCULAR | 0 refills | Status: DC
  Filled 2021-09-19: qty 2.63, 84d supply, fill #0

## 2021-09-20 ENCOUNTER — Other Ambulatory Visit (HOSPITAL_COMMUNITY): Payer: Self-pay

## 2021-09-21 ENCOUNTER — Other Ambulatory Visit (HOSPITAL_COMMUNITY): Payer: Self-pay

## 2021-09-22 ENCOUNTER — Ambulatory Visit: Payer: No Typology Code available for payment source | Admitting: Cardiology

## 2021-09-25 ENCOUNTER — Other Ambulatory Visit (HOSPITAL_COMMUNITY): Payer: Self-pay

## 2021-09-27 ENCOUNTER — Other Ambulatory Visit (HOSPITAL_BASED_OUTPATIENT_CLINIC_OR_DEPARTMENT_OTHER): Payer: Self-pay

## 2021-10-03 ENCOUNTER — Other Ambulatory Visit (HOSPITAL_COMMUNITY): Payer: Self-pay

## 2021-10-10 ENCOUNTER — Other Ambulatory Visit (HOSPITAL_COMMUNITY)
Admission: RE | Admit: 2021-10-10 | Discharge: 2021-10-10 | Disposition: A | Discharge status: Home / Self Care | Payer: No Typology Code available for payment source | Source: Ambulatory Visit | Attending: Family Medicine | Admitting: Family Medicine

## 2021-10-10 ENCOUNTER — Ambulatory Visit (INDEPENDENT_AMBULATORY_CARE_PROVIDER_SITE_OTHER): Payer: No Typology Code available for payment source | Admitting: Family Medicine

## 2021-10-10 ENCOUNTER — Encounter: Payer: Self-pay | Admitting: Family Medicine

## 2021-10-10 ENCOUNTER — Other Ambulatory Visit (HOSPITAL_BASED_OUTPATIENT_CLINIC_OR_DEPARTMENT_OTHER): Payer: Self-pay

## 2021-10-10 VITALS — BP 112/70 | HR 102 | Temp 98.7°F | Resp 18 | Ht 64.0 in | Wt 219.0 lb

## 2021-10-10 DIAGNOSIS — N898 Other specified noninflammatory disorders of vagina: Secondary | ICD-10-CM | POA: Diagnosis present

## 2021-10-10 DIAGNOSIS — Z1159 Encounter for screening for other viral diseases: Secondary | ICD-10-CM | POA: Diagnosis not present

## 2021-10-10 DIAGNOSIS — F319 Bipolar disorder, unspecified: Secondary | ICD-10-CM

## 2021-10-10 DIAGNOSIS — Z7251 High risk heterosexual behavior: Secondary | ICD-10-CM | POA: Diagnosis not present

## 2021-10-10 DIAGNOSIS — R8290 Unspecified abnormal findings in urine: Secondary | ICD-10-CM

## 2021-10-10 LAB — POC URINALSYSI DIPSTICK (AUTOMATED)
Blood, UA: NEGATIVE
Glucose, UA: NEGATIVE
Leukocytes, UA: NEGATIVE
Nitrite, UA: NEGATIVE
Protein, UA: POSITIVE — AB
Spec Grav, UA: >=1.03 — AB (ref 1.010–1.025)
Urobilinogen, UA: 0.2 E.U./dL
pH, UA: 6 (ref 5.0–8.0)

## 2021-10-10 LAB — POCT URINE PREGNANCY: Preg Test, Ur: NEGATIVE

## 2021-10-10 MED ORDER — METRONIDAZOLE 0.75 % VA GEL
1.0000 | Freq: Every day | VAGINAL | 0 refills | Status: AC
  Filled 2021-10-10: qty 70, 5d supply, fill #0

## 2021-10-10 MED ORDER — VALACYCLOVIR HCL 1 G PO TABS
1000.0000 mg | ORAL_TABLET | Freq: Every day | ORAL | 5 refills | Status: AC
  Filled 2021-10-10: qty 30, 30d supply, fill #0

## 2021-10-10 NOTE — Patient Instructions (Signed)

## 2021-10-10 NOTE — Progress Notes (Signed)
Established Patient Office Visit  Subjective:  Patient ID: [name], female    DOB: [birth year]  Age: 26 y.o. MRN: [id number]  CC:  Chief Complaint  Patient presents with   Vaginal Discharge    Sxs started around 02/19. Pt states using Monistat 3 days. It did help some.     HPI [name] presents for vaginal d/c-- she used monistat 2/17-19--- with no relief.    + fishy odor , and clear d/c  She is sexually active and is using a condom.     Past Medical History:  Diagnosis Date   Acute vaginitis 05/20/2019   ADD (attention deficit disorder) 04/24/2011   ADHD (attention deficit hyperactivity disorder)    Allergy    Anxiety    Benzodiazepine dependence (HCC) 05/12/2020   Bipolar disorder (HCC)    Bipolar disorder, current episode manic severe with psychotic features (HCC)    Bipolar I disorder (HCC) 05/12/2020   Constipation 04/03/2018   Delirium tremens (HCC) 05/20/2019   Depression    Eczema 12/11/13   Genital herpes simplex 10/24/2017   High risk heterosexual behavior 05/20/2019   MDD (major depressive disorder), recurrent severe, without psychosis (HCC) 02/11/2018   Morbid obesity (HCC) 09/06/2019   Pelvic pain 01/31/2018   Possible pregnancy 09/06/2019   Preventative health care 03/16/2013   Schizo-affective schizophrenia (HCC)    Schizoaffective disorder (HCC) 03/08/2018   Seizure (HCC) 05/20/2019   Severe bipolar I disorder, current or most recent episode depressed (HCC) 03/30/2017   STD exposure 05/13/2015   Syncope 03/03/2017   Vitamin D deficiency 03/10/2019    No past surgical history on file.  Family History  Problem Relation Age of Onset   Asthma Mother    Stroke Father    Heart disease Father    Asthma Father    Hypertension Father        pulmonary hypertension   Heart disease Maternal Uncle    Hypertension Maternal Grandmother    COPD Maternal Grandmother    Diabetes Maternal Grandfather    Stroke Paternal Grandmother    Diabetes Paternal  Grandmother    Diabetes Paternal Grandfather    Leukemia Maternal Uncle     Social History   Socioeconomic History   Marital status: Single    Spouse name: Not on file   Number of children: Not on file   Years of education: Not on file   Highest education level: Not on file  Occupational History   Occupation: Unemployed  Tobacco Use   Smoking status: Never   Smokeless tobacco: Never  Vaping Use   Vaping Use: Former   Quit date: 09/01/2017  Substance and Sexual Activity   Alcohol use: Not Currently    Comment: occasionally   Drug use: Not Currently   Sexual activity: Not Currently    Birth control/protection: Pill  Other Topics Concern   Not on file  Social History Narrative   Lives with mom in an apartment on the second floor.  No children.  Currently not working.  Education: college. Left handed   Social Determinants of Health   Financial Resource Strain: Not on file  Food Insecurity: Not on file  Transportation Needs: Not on file  Physical Activity: Not on file  Stress: Not on file  Social Connections: Not on file  Intimate Partner Violence: Not on file    Outpatient Medications Prior to Visit  Medication Sig Dispense Refill   aspirin EC 81 MG tablet Take 81 mg  by mouth 2 (two) times daily. Swallow whole.     cetirizine (ZYRTEC) 10 MG tablet Take 1/2 tablet (5 mg total) by mouth daily. 100 tablet 1   LORazepam (ATIVAN) 1 MG tablet TAKE 1 TABLET BY MOUTH 4 TIMES DAILY. 360 tablet 0   paliperidone Palmitate ER (INVEGA TRINZA) 819 MG/2.63ML SUSY injection Use as directed every 3 months 1 mL 3   paliperidone Palmitate ER (INVEGA TRINZA) 819 MG/2.63ML SUSY injection USE AS DIRECTED EVERY 3 MONTHS 2.63 mL 0   propranolol (INDERAL) 20 MG tablet Take 1 tablet (20 mg total) by mouth 3 (three) times daily. Must keep scheduled appointment for future refills 90 tablet 0   QUEtiapine (SEROQUEL) 100 MG tablet Take 1 tablet (100 mg total) by mouth at bedtime. 30 tablet 0    triamcinolone (KENALOG) 0.1 %      valACYclovir (VALTREX) 1000 MG tablet Take 1 tablet (1,000 mg total) by mouth 2 (two) times daily. 20 tablet 0   mometasone (NASONEX) 50 MCG/ACT nasal spray Place 2 sprays into the nose daily as directed (Patient not taking: Reported on 08/03/2021) 17 g 12   QUEtiapine (SEROQUEL XR) 400 MG 24 hr tablet TAKE 2 TABLETS BY MOUTH EVERY NIGHT AT BEDTIME (Patient taking differently: Take 200 mg by mouth at bedtime.) 60 tablet 11   benzonatate (TESSALON) 100 MG capsule Take 1 capsule (100 mg total) by mouth 3 (three) times daily as needed. (Patient not taking: Reported on 10/10/2021) 20 capsule 0   Cholecalciferol 1.25 MG (50000 UT) capsule Take 1 capsule by mouth once a week. (Patient not taking: Reported on 10/10/2021) 4 capsule 2   ipratropium (ATROVENT) 0.03 % nasal spray Place 2 sprays into both nostrils every 12 (twelve) hours. (Patient not taking: Reported on 10/10/2021) 30 mL 12   norgestimate-ethinyl estradiol (SPRINTEC 28) 0.25-35 MG-MCG tablet Take 1 tablet by mouth daily. (Patient not taking: Reported on 10/10/2021) 28 tablet 11   QUEtiapine (SEROQUEL XR) 400 MG 24 hr tablet Take 1&1/2 to 2 tablets by mouth at bedtime (Patient not taking: Reported on 08/03/2021) 60 tablet 0   QUEtiapine (SEROQUEL XR) 400 MG 24 hr tablet Take 2 tablets by mouth every night at bedtime (Patient not taking: Reported on 08/03/2021) 60 tablet 11   No facility-administered medications prior to visit.    Allergies  Allergen Reactions   Concerta [Methylphenidate] Other (See Comments)    hallucinations   Terbinafine Other (See Comments)   Trazodone And Nefazodone Other (See Comments)    Pt stated she has nightmares   Clindamycin Hcl Rash   Haldol [Haloperidol] Anxiety    Unable to seat still   Terbinafine Hcl Nausea And Vomiting    ROS Review of Systems  Constitutional:  Negative for appetite change, diaphoresis, fatigue and unexpected weight change.  Eyes:  Negative for pain,  redness and visual disturbance.  Respiratory:  Negative for cough, chest tightness, shortness of breath and wheezing.   Cardiovascular:  Negative for chest pain, palpitations and leg swelling.  Endocrine: Negative for cold intolerance, heat intolerance, polydipsia, polyphagia and polyuria.  Genitourinary:  Positive for vaginal discharge. Negative for decreased urine volume, difficulty urinating, dysuria, frequency, pelvic pain, urgency, vaginal bleeding and vaginal pain.  Neurological:  Negative for dizziness, light-headedness, numbness and headaches.     Objective:    Physical Exam Vitals and nursing note reviewed.  Constitutional:      Appearance: She is well-developed.  HENT:     Head: Normocephalic and atraumatic.  Eyes:  Conjunctiva/sclera: Conjunctivae normal.  Neck:     Thyroid: No thyromegaly.     Vascular: No carotid bruit or JVD.  Cardiovascular:     Rate and Rhythm: Normal rate and regular rhythm.     Heart sounds: Normal heart sounds. No murmur heard. Pulmonary:     Effort: Pulmonary effort is normal. No respiratory distress.     Breath sounds: Normal breath sounds. No wheezing or rales.  Chest:     Chest wall: No tenderness.  Abdominal:     Tenderness: There is no abdominal tenderness.  Genitourinary:    Comments: Self swab collected  Musculoskeletal:     Cervical back: Normal range of motion and neck supple.  Neurological:     Mental Status: She is alert and oriented to person, place, and time.    BP 112/70 (BP Location: Left Arm, Patient Position: Sitting, Cuff Size: Normal)    Pulse (!) 102    Temp 98.7 F (37.1 C) (Oral)    Resp 18    Ht 5\' 4"  (1.626 m)    Wt 219 lb (99.3 kg)    SpO2 96%    BMI 37.59 kg/m  Wt Readings from Last 3 Encounters:  10/10/21 219 lb (99.3 kg)  08/03/21 239 lb (108.4 kg)  06/23/21 240 lb (108.9 kg)     Health Maintenance Due  Topic Date Due   PAP-Cervical Cytology Screening  11/03/2019   PAP SMEAR-Modifier  11/03/2019    COVID-19 Vaccine (4 - Booster for Pfizer series) 01/25/2021   INFLUENZA VACCINE  03/13/2021    There are no preventive care reminders to display for this patient.  Lab Results  Component Value Date   TSH 1.33 10/06/2020   Lab Results  Component Value Date   WBC 5.6 10/06/2020   HGB 13.1 10/06/2020   HCT 39.1 10/06/2020   MCV 89.1 10/06/2020   PLT 293.0 10/06/2020   Lab Results  Component Value Date   NA 137 10/06/2020   K 4.6 10/06/2020   CO2 28 10/06/2020   GLUCOSE 93 10/06/2020   BUN 17 10/06/2020   CREATININE 0.78 10/06/2020   BILITOT 0.5 10/06/2020   ALKPHOS 75 10/06/2020   AST 13 10/06/2020   ALT 15 10/06/2020   PROT 7.1 10/06/2020   ALBUMIN 4.2 10/06/2020   CALCIUM 9.6 10/06/2020   ANIONGAP 12 07/02/2020   GFR 104.64 10/06/2020   Lab Results  Component Value Date   CHOL 196 10/06/2020   Lab Results  Component Value Date   HDL 54.30 10/06/2020   Lab Results  Component Value Date   LDLCALC 121 (H) 10/06/2020   Lab Results  Component Value Date   TRIG 100.0 10/06/2020   Lab Results  Component Value Date   CHOLHDL 4 10/06/2020   Lab Results  Component Value Date   HGBA1C 5.0 07/02/2020      Assessment & Plan:   Problem List Items Addressed This Visit       Unprioritized   High risk sexual behavior   Relevant Orders   HIV antibody (with reflex)   RPR   Hepatitis, Acute   POCT Urinalysis Dipstick (Automated) (Completed)   POCT urine pregnancy (Completed)   Other Visit Diagnoses     Vaginal discharge    -  Primary   Relevant Medications   metroNIDAZOLE (METROGEL VAGINAL) 0.75 % vaginal gel   Other Relevant Orders   Cervicovaginal ancillary only( [facility])   POCT Urinalysis Dipstick (Automated) (Completed)   POCT urine  pregnancy (Completed)   Herpes simplex virus (HSV) type I or type II DNA not detected by PCR       Relevant Medications   valACYclovir (VALTREX) 1000 MG tablet   Bipolar depression (HCC)       Relevant Orders    Ambulatory referral to Psychology   Abnormal urine findings       Relevant Orders   Urine Culture       Meds ordered this encounter  Medications   valACYclovir (VALTREX) 1000 MG tablet    Sig: Take 1 tablet (1,000 mg total) by mouth daily.    Dispense:  30 tablet    Refill:  5   metroNIDAZOLE (METROGEL VAGINAL) 0.75 % vaginal gel    Sig: Insert 1 Applicatorful vaginally at bedtime for 5 days.    Dispense:  70 g    Refill:  0    Follow-up: Return if symptoms worsen or fail to improve.    Donato SchultzYvonne R Lowne Chase, DO

## 2021-10-11 LAB — RPR: RPR Ser Ql: NONREACTIVE

## 2021-10-11 LAB — HEPATITIS PANEL, ACUTE
Hep A IgM: NONREACTIVE
Hep B C IgM: NONREACTIVE
Hepatitis B Surface Ag: NONREACTIVE
Hepatitis C Ab: NONREACTIVE
SIGNAL TO CUT-OFF: <0.02 (ref <1.00)

## 2021-10-11 LAB — URINE CULTURE
MICRO NUMBER:: 13067792
SPECIMEN QUALITY:: ADEQUATE

## 2021-10-11 LAB — HIV ANTIBODY (ROUTINE TESTING W REFLEX): HIV 1&2 Ab, 4th Generation: NONREACTIVE

## 2021-10-12 ENCOUNTER — Other Ambulatory Visit: Payer: Self-pay

## 2021-10-12 ENCOUNTER — Ambulatory Visit (INDEPENDENT_AMBULATORY_CARE_PROVIDER_SITE_OTHER): Payer: No Typology Code available for payment source | Admitting: Surgical

## 2021-10-12 DIAGNOSIS — S82891A Other fracture of right lower leg, initial encounter for closed fracture: Secondary | ICD-10-CM | POA: Diagnosis not present

## 2021-10-12 LAB — CERVICOVAGINAL ANCILLARY ONLY
Bacterial Vaginitis (gardnerella): NEGATIVE
Candida Glabrata: NEGATIVE
Candida Vaginitis: NEGATIVE
Chlamydia: NEGATIVE
Comment: NEGATIVE
Comment: NEGATIVE
Comment: NEGATIVE
Comment: NEGATIVE
Comment: NEGATIVE
Comment: NORMAL
Neisseria Gonorrhea: NEGATIVE
Trichomonas: NEGATIVE

## 2021-10-13 ENCOUNTER — Encounter: Payer: Self-pay | Admitting: Orthopedic Surgery

## 2021-10-13 NOTE — Progress Notes (Signed)
? ?Office Visit Note ?  ?Patient: [name]           ?Date of Birth: Aug 10, 1996           ?MRN: [id number] ?Visit Date: 10/12/2021 ?Requested by: Zola Button, Myrene Buddy R, DO ?[address] ?[address] ?[place],  [place] [address] ?PCP: [name], DO ? ?Subjective: ?Chief Complaint  ?Patient presents with  ? Right Ankle - Follow-up  ? ? ?HPI: [name] is a 26 y.o. female who presents to the office for reevaluation of right ankle fracture.  She has been having nonoperative treatment for right ankle fracture that was sustained on 06/26/2021.  She is ambulating full weightbearing in a regular shoe without any significant pain.  She has not returned to working out where mostly she wants to do some [employer] and squats.  Denies any mechanical symptoms in her ankle or any ankle instability.  Does not have to take any medications for pain control..   ?             ?ROS: All systems reviewed are negative as they relate to the chief complaint within the history of present illness.  Patient denies fevers or chills. ? ?Assessment & Plan: ?Visit Diagnoses:  ?1. Closed fracture of right ankle, initial encounter   ? ? ?Plan: Patient is a 26 year old female who presents for repeat evaluation of right ankle fracture that was sustained on 06/26/2021.  Overall doing well.  No tenderness on exam today.  She ambulates without antalgia.  Does not have to take any medications for pain.  No mechanical symptoms or instability.  Plan is to have patient follow-up with the office as needed.  She may work back into her normal workout regimen over the next month.  If she notices any increase in pain or any other complaints, she will return to the office.  Follow-up as needed. ? ?Follow-Up Instructions: No follow-ups on file.  ? ?Orders:  ?No orders of the defined types were placed in this encounter. ? ?No orders of the defined types were placed in this encounter. ? ? ? ? Procedures: ?No procedures performed ? ? ?Clinical  Data: ?No additional findings. ? ?Objective: ?Vital Signs: There were no vitals taken for this visit. ? ?Physical Exam:  ?Constitutional: Patient appears well-developed ?HEENT:  ?Head: Normocephalic ?Eyes:EOM are normal ?Neck: Normal range of motion ?Cardiovascular: Normal rate ?Pulmonary/chest: Effort normal ?Neurologic: Patient is alert ?Skin: Skin is warm ?Psychiatric: Patient has normal mood and affect ? ?Ortho Exam: Ortho exam demonstrates right ankle with 2+ DP pulse.  Intact ankle dorsiflexion and plantarflexion and inversion/eversion strength rated 5/5.  She has no tenderness over the medial or lateral malleoli.  She dorsiflexes to 15 degrees with the knee flexed.  No calf tenderness.  Negative Homans' sign.  No significant swelling or ecchymosis noted. ? ?Specialty Comments:  ?No specialty comments available. ? ?Imaging: ?No results found. ? ? ?PMFS History: ?Patient Active Problem List  ? Diagnosis Date Noted  ? Gastroenteritis 06/13/2021  ? Bronchitis 04/18/2021  ? Seizure disorder (HCC) 10/25/2020  ? Thrush 10/25/2020  ? Amenorrhea 10/06/2020  ? Ear pain, left 10/06/2020  ? Schizo-affective schizophrenia (HCC)   ? Allergy   ? ADHD (attention deficit hyperactivity disorder)   ? Depression   ? Bipolar I disorder (HCC) 05/12/2020  ? Benzodiazepine dependence (HCC) 05/12/2020  ? Morbid obesity (HCC) 09/06/2019  ? Possible pregnancy 09/06/2019  ? Delirium tremens (HCC) 05/20/2019  ? Seizure (  HCC) 05/20/2019  ? Acute vaginitis 05/20/2019  ? High risk sexual behavior 05/20/2019  ? Vitamin D deficiency 03/10/2019  ? Constipation 04/03/2018  ? Schizoaffective disorder (HCC) 03/08/2018  ? Bipolar disorder, current episode manic severe with psychotic features (HCC)   ? MDD (major depressive disorder), recurrent severe, without psychosis (HCC) 02/11/2018  ? Pelvic pain 01/31/2018  ? Genital herpes simplex 10/24/2017  ? Anxiety 08/30/2017  ? Severe bipolar I disorder, current or most recent episode depressed (HCC)  03/30/2017  ? Syncope 03/03/2017  ? STD exposure 05/13/2015  ? Eczema 12/11/2013  ? Encounter for contraceptive management 03/16/2013  ? Bipolar disorder (HCC) 12/30/2012  ? ADD (attention deficit disorder) 04/24/2011  ? ?Past Medical History:  ?Diagnosis Date  ? Acute vaginitis 05/20/2019  ? ADD (attention deficit disorder) 04/24/2011  ? ADHD (attention deficit hyperactivity disorder)   ? Allergy   ? Anxiety   ? Benzodiazepine dependence (HCC) 05/12/2020  ? Bipolar disorder (HCC)   ? Bipolar disorder, current episode manic severe with psychotic features (HCC)   ? Bipolar I disorder (HCC) 05/12/2020  ? Constipation 04/03/2018  ? Delirium tremens (HCC) 05/20/2019  ? Depression   ? Eczema 12/11/13  ? Genital herpes simplex 10/24/2017  ? High risk heterosexual behavior 05/20/2019  ? MDD (major depressive disorder), recurrent severe, without psychosis (HCC) 02/11/2018  ? Morbid obesity (HCC) 09/06/2019  ? Pelvic pain 01/31/2018  ? Possible pregnancy 09/06/2019  ? Preventative health care 03/16/2013  ? Schizo-affective schizophrenia (HCC)   ? Schizoaffective disorder (HCC) 03/08/2018  ? Seizure (HCC) 05/20/2019  ? Severe bipolar I disorder, current or most recent episode depressed (HCC) 03/30/2017  ? STD exposure 05/13/2015  ? Syncope 03/03/2017  ? Vitamin D deficiency 03/10/2019  ?  ?Family History  ?Problem Relation Age of Onset  ? Asthma Mother   ? Stroke Father   ? Heart disease Father   ? Asthma Father   ? Hypertension Father   ?     pulmonary hypertension  ? Heart disease Maternal Uncle   ? Hypertension Maternal Grandmother   ? COPD Maternal Grandmother   ? Diabetes Maternal Grandfather   ? Stroke Paternal Grandmother   ? Diabetes Paternal Grandmother   ? Diabetes Paternal Grandfather   ? Leukemia Maternal Uncle   ?  ?No past surgical history on file. ?Social History  ? ?Occupational History  ? Occupation: Unemployed  ?Tobacco Use  ? Smoking status: Never  ? Smokeless tobacco: Never  ?Vaping Use  ? Vaping Use: Former  ? Quit date:  09/01/2017  ?Substance and Sexual Activity  ? Alcohol use: Not Currently  ?  Comment: occasionally  ? Drug use: Not Currently  ? Sexual activity: Not Currently  ?  Birth control/protection: Pill  ? ? ? ? ?  ?

## 2021-10-17 ENCOUNTER — Ambulatory Visit: Payer: No Typology Code available for payment source | Admitting: Cardiology

## 2021-10-19 ENCOUNTER — Other Ambulatory Visit (HOSPITAL_COMMUNITY): Payer: Self-pay

## 2021-10-19 ENCOUNTER — Other Ambulatory Visit: Payer: Self-pay | Admitting: Family Medicine

## 2021-10-19 ENCOUNTER — Ambulatory Visit: Payer: No Typology Code available for payment source | Admitting: Cardiology

## 2021-10-19 DIAGNOSIS — J302 Other seasonal allergic rhinitis: Secondary | ICD-10-CM

## 2021-10-19 DIAGNOSIS — T7840XA Allergy, unspecified, initial encounter: Secondary | ICD-10-CM

## 2021-10-20 ENCOUNTER — Other Ambulatory Visit (HOSPITAL_COMMUNITY): Payer: Self-pay

## 2021-10-20 MED ORDER — AZELASTINE HCL 0.1 % NA SOLN
2.0000 | Freq: Every day | NASAL | 12 refills | Status: DC | PRN
  Filled 2021-10-20: qty 30, 50d supply, fill #0
  Filled 2021-11-24 – 2022-03-23 (×2): qty 30, 50d supply, fill #1

## 2021-10-25 ENCOUNTER — Other Ambulatory Visit (HOSPITAL_COMMUNITY): Payer: Self-pay

## 2021-10-25 ENCOUNTER — Encounter: Payer: Self-pay | Admitting: Cardiology

## 2021-11-16 ENCOUNTER — Ambulatory Visit (INDEPENDENT_AMBULATORY_CARE_PROVIDER_SITE_OTHER): Payer: No Typology Code available for payment source | Admitting: Family Medicine

## 2021-11-16 ENCOUNTER — Encounter: Payer: Self-pay | Admitting: Family Medicine

## 2021-11-16 ENCOUNTER — Other Ambulatory Visit (HOSPITAL_BASED_OUTPATIENT_CLINIC_OR_DEPARTMENT_OTHER): Payer: Self-pay

## 2021-11-16 VITALS — BP 104/70 | HR 101 | Temp 98.6°F | Resp 18 | Ht 64.0 in | Wt 221.4 lb

## 2021-11-16 DIAGNOSIS — R110 Nausea: Secondary | ICD-10-CM

## 2021-11-16 LAB — POC URINALSYSI DIPSTICK (AUTOMATED)
Bilirubin, UA: NEGATIVE
Blood, UA: NEGATIVE
Glucose, UA: NEGATIVE
Ketones, UA: NEGATIVE
Leukocytes, UA: NEGATIVE
Nitrite, UA: NEGATIVE
Protein, UA: NEGATIVE
Spec Grav, UA: 1.025 (ref 1.010–1.025)
Urobilinogen, UA: 0.2 E.U./dL
pH, UA: 6 (ref 5.0–8.0)

## 2021-11-16 LAB — POCT URINE PREGNANCY: Preg Test, Ur: NEGATIVE

## 2021-11-16 MED ORDER — NORGESTIMATE-ETH ESTRADIOL 0.25-35 MG-MCG PO TABS: 1.0000 | ORAL_TABLET | Freq: Every day | ORAL | 11 refills | Status: DC

## 2021-11-16 MED ORDER — PROPRANOLOL HCL 20 MG PO TABS: ORAL_TABLET | ORAL | 0 refills | Status: DC

## 2021-11-16 MED ORDER — FAMOTIDINE 20 MG PO TABS
20.0000 mg | ORAL_TABLET | Freq: Two times a day (BID) | ORAL | 2 refills | Status: DC
  Filled 2021-11-16: qty 60, 30d supply, fill #0

## 2021-11-16 NOTE — Patient Instructions (Signed)
Nausea, Adult ?Nausea is the feeling of having an upset stomach or that you are about to vomit. Nausea on its own is not usually a serious concern, but it may be an early sign of a more serious medical problem. As nausea gets worse, it can lead to vomiting. If vomiting develops, or if you are not able to drink enough fluids, you are at risk of becoming dehydrated. ?Dehydration can make you tired and thirsty, cause you to have a dry mouth, and decrease how often you urinate. Older adults and people with other diseases or a weak disease-fighting system (immune system) are at higher risk for dehydration. The main goals of treating your nausea are: ?To relieve your nausea. ?To limit repeated nausea episodes. ?To prevent vomiting and dehydration. ?Follow these instructions at home: ?Watch your symptoms for any changes. Tell your health care provider about them. ?Eating and drinking ?  ?Take an oral rehydration solution (ORS). This is a drink that is sold at pharmacies and retail stores. ?Drink clear fluids slowly and in small amounts as you are able. Clear fluids include water, ice chips, low-calorie sports drinks, and fruit juice that has water added (diluted fruit juice). ?Eat bland, easy-to-digest foods in small amounts as you are able. These foods include bananas, applesauce, rice, lean meats, toast, and crackers. ?Avoid drinking fluids that contain a lot of sugar or caffeine, such as energy drinks, sports drinks, and soda. ?Avoid alcohol. ?Avoid spicy or fatty foods. ?General instructions ?Take over-the-counter and prescription medicines only as told by your health care provider. ?Rest at home while you recover. ?Drink enough fluid to keep your urine pale yellow. ?Breathe slowly and deeply when you feel nauseous. ?Avoid smelling things that have strong odors. ?Wash your hands often using soap and water for at least 20 seconds. If soap and water are not available, use hand sanitizer. ?Make sure that everyone in your  household washes their hands well and often. ?Keep all follow-up visits. This is important. ?Contact a health care provider if: ?Your nausea gets worse. ?Your nausea does not go away after two days. ?You vomit multiple times. ?You cannot drink fluids without vomiting. ?You have any of the following: ?New symptoms. ?A fever. ?A headache. ?Muscle cramps. ?A rash. ?Pain while urinating. ?You feel light-headed or dizzy. ?Get help right away if: ?You have pain in your chest, neck, arm, or jaw. ?You feel extremely weak or you faint. ?You have vomit that is bright red or looks like coffee grounds. ?You have bloody or black stools (feces) or stools that look like tar. ?You have a severe headache, a stiff neck, or both. ?You have severe pain, cramping, or bloating in your abdomen. ?You have difficulty breathing or are breathing very quickly. ?Your heart is beating very quickly. ?Your skin feels cold and clammy. ?You feel confused. ?You have signs of dehydration, such as: ?Dark urine, very little urine, or no urine. ?Cracked lips. ?Dry mouth. ?Sunken eyes. ?Sleepiness. ?Weakness. ?These symptoms may be an emergency. Get help right away. Call 911. ?Do not wait to see if the symptoms will go away. ?Do not drive yourself to the hospital. ?Summary ?Nausea is the feeling that you have an upset stomach or that you are about to vomit. Nausea on its own is not usually a serious concern, but it may be an early sign of a more serious medical problem. ?If vomiting develops, or if you are not able to drink enough fluids, you are at risk of becoming dehydrated. ?Follow   recommendations for eating and drinking and take over-the-counter and prescription medicines only as told by your health care provider. ?Contact a health care provider right away if your symptoms worsen or you have new symptoms. ?Keep all follow-up visits. This is important. ?This information is not intended to replace advice given to you by your health care provider. Make  sure you discuss any questions you have with your health care provider. ?Document Revised: 02/03/2021 Document Reviewed: 02/03/2021 ?Elsevier Patient Education ? 2022 [employer] ? ?

## 2021-11-16 NOTE — Progress Notes (Signed)
? ?Subjective:  ? ?By signing my name below, I, [name], attest that this documentation has been prepared under the direction and in the presence of Jaclyn Schultz, DO. 11/16/2021 ?  ? ? Patient ID: [name], female    DOB: 04-17-1996, 26 y.o.   MRN: [id number] ? ?Chief Complaint  ?Patient presents with  ? GI Problem  ?  Pt states on Monday she sneezed or cough up a tape worm. Pt states it looked like poop on her arm. Pt states having nausea  ? ? ?GI Problem ?The primary symptoms include nausea. Primary symptoms do not include dysuria.  ?Patient is in today for a office visit. She is present with her mother during this visit.  ? ?She reports coughing up a "tapeworm". She also notes smelling feces around the time of coughing up the "tapeworm". She reports eating an avocado sushi roll last week. She also has nausea and frequently burping. She denies having any burning, frequency.  ?She does not know if she is pregnant. She reports using a condom during intercourse.  ? ? ?Past Medical History:  ?Diagnosis Date  ? Acute vaginitis 05/20/2019  ? ADD (attention deficit disorder) 04/24/2011  ? ADHD (attention deficit hyperactivity disorder)   ? Allergy   ? Anxiety   ? Benzodiazepine dependence (HCC) 05/12/2020  ? Bipolar disorder (HCC)   ? Bipolar disorder, current episode manic severe with psychotic features (HCC)   ? Bipolar I disorder (HCC) 05/12/2020  ? Constipation 04/03/2018  ? Delirium tremens (HCC) 05/20/2019  ? Depression   ? Eczema 12/11/13  ? Genital herpes simplex 10/24/2017  ? High risk heterosexual behavior 05/20/2019  ? MDD (major depressive disorder), recurrent severe, without psychosis (HCC) 02/11/2018  ? Morbid obesity (HCC) 09/06/2019  ? Pelvic pain 01/31/2018  ? Possible pregnancy 09/06/2019  ? Preventative health care 03/16/2013  ? Schizo-affective schizophrenia (HCC)   ? Schizoaffective disorder (HCC) 03/08/2018  ? Seizure (HCC) 05/20/2019  ? Severe bipolar I disorder, current or most recent episode  depressed (HCC) 03/30/2017  ? STD exposure 05/13/2015  ? Syncope 03/03/2017  ? Vitamin D deficiency 03/10/2019  ? ? ?No past surgical history on file. ? ?Family History  ?Problem Relation Age of Onset  ? Asthma Mother   ? Stroke Father   ? Heart disease Father   ? Asthma Father   ? Hypertension Father   ?     pulmonary hypertension  ? Heart disease Maternal Uncle   ? Hypertension Maternal Grandmother   ? COPD Maternal Grandmother   ? Diabetes Maternal Grandfather   ? Stroke Paternal Grandmother   ? Diabetes Paternal Grandmother   ? Diabetes Paternal Grandfather   ? Leukemia Maternal Uncle   ? ? ?Social History  ? ?Socioeconomic History  ? Marital status: Single  ?  Spouse name: Not on file  ? Number of children: Not on file  ? Years of education: Not on file  ? Highest education level: Not on file  ?Occupational History  ? Occupation: Unemployed  ?Tobacco Use  ? Smoking status: Never  ? Smokeless tobacco: Never  ?Vaping Use  ? Vaping Use: Former  ? Quit date: 09/01/2017  ?Substance and Sexual Activity  ? Alcohol use: Not Currently  ?  Comment: occasionally  ? Drug use: Not Currently  ? Sexual activity: Not Currently  ?  Birth control/protection: Pill  ?Other Topics Concern  ? Not on file  ?Social History Narrative  ? Lives with mom in  an apartment on the second floor.  No children.  Currently not working.  Education: college. Left handed  ? ?Social Determinants of Health  ? ?Financial Resource Strain: Not on file  ?Food Insecurity: Not on file  ?Transportation Needs: Not on file  ?Physical Activity: Not on file  ?Stress: Not on file  ?Social Connections: Not on file  ?Intimate Partner Violence: Not on file  ? ? ?Outpatient Medications Prior to Visit  ?Medication Sig Dispense Refill  ? azelastine (ASTELIN) 0.1 % nasal spray Place 2 sprays into both nostrils daily as needed for rhinitis or allergies as directed 30 mL 12  ? cetirizine (ZYRTEC) 10 MG tablet Take 1/2 tablet (5 mg total) by mouth daily. 100 tablet 1  ?  LORazepam (ATIVAN) 1 MG tablet TAKE 1 TABLET BY MOUTH 4 TIMES DAILY. 360 tablet 0  ? paliperidone Palmitate ER (INVEGA TRINZA) 819 MG/2.63ML SUSY injection USE AS DIRECTED EVERY 3 MONTHS 2.63 mL 0  ? QUEtiapine (SEROQUEL) 100 MG tablet Take 1 tablet (100 mg total) by mouth at bedtime. 30 tablet 0  ? triamcinolone (KENALOG) 0.1 %     ? valACYclovir (VALTREX) 1000 MG tablet Take 1 tablet (1,000 mg total) by mouth daily. 30 tablet 5  ? aspirin EC 81 MG tablet Take 81 mg by mouth 2 (two) times daily. Swallow whole.    ? paliperidone Palmitate ER (INVEGA TRINZA) 819 MG/2.63ML SUSY injection Use as directed every 3 months 1 mL 3  ? propranolol (INDERAL) 20 MG tablet Take 1 tablet (20 mg total) by mouth 3 (three) times daily. Must keep scheduled appointment for future refills 90 tablet 0  ? QUEtiapine (SEROQUEL XR) 400 MG 24 hr tablet TAKE 2 TABLETS BY MOUTH EVERY NIGHT AT BEDTIME (Patient taking differently: Take 200 mg by mouth at bedtime.) 60 tablet 11  ? ?No facility-administered medications prior to visit.  ? ? ?Allergies  ?Allergen Reactions  ? Concerta [Methylphenidate] Other (See Comments)  ?  hallucinations  ? Terbinafine Other (See Comments)  ? Trazodone And Nefazodone Other (See Comments)  ?  Pt stated she has nightmares  ? Clindamycin Hcl Rash  ? Haldol [Haloperidol] Anxiety  ?  Unable to seat still  ? Terbinafine Hcl Nausea And Vomiting  ? ? ?Review of Systems  ?Gastrointestinal:  Positive for nausea.  ?     (+)frequent belching  ?Genitourinary:  Negative for dysuria and frequency.  ? ?   ?Objective:  ?  ?Physical Exam ?Constitutional:   ?   General: She is not in acute distress. ?   Appearance: Normal appearance. She is not ill-appearing.  ?HENT:  ?   Head: Normocephalic and atraumatic.  ?   Right Ear: External ear normal.  ?   Left Ear: External ear normal.  ?Eyes:  ?   Extraocular Movements: Extraocular movements intact.  ?   Pupils: Pupils are equal, round, and reactive to light.  ?Cardiovascular:  ?    Rate and Rhythm: Normal rate and regular rhythm.  ?   Heart sounds: Normal heart sounds. No murmur heard. ?  No gallop.  ?Pulmonary:  ?   Effort: Pulmonary effort is normal. No respiratory distress.  ?   Breath sounds: Normal breath sounds. No wheezing or rales.  ?Skin: ?   General: Skin is warm and dry.  ?Neurological:  ?   Mental Status: She is alert and oriented to person, place, and time.  ?Psychiatric:     ?   Judgment: Judgment normal.  ? ? ?  BP 104/70 (BP Location: Right Arm, Patient Position: Sitting, Cuff Size: Large)   Pulse (!) 101   Temp 98.6 ?F (37 ?C) (Oral)   Resp 18   Ht 5\' 4"  (1.626 m)   Wt 221 lb 6.4 oz (100.4 kg)   SpO2 97%   BMI 38.00 kg/m?  ?Wt Readings from Last 3 Encounters:  ?11/24/21 225 lb (102.1 kg)  ?11/16/21 221 lb 6.4 oz (100.4 kg)  ?10/10/21 219 lb (99.3 kg)  ? ? ?Diabetic Foot Exam - Simple   ?No data filed ?  ? ?Lab Results  ?Component Value Date  ? WBC 6.0 11/16/2021  ? HGB 12.8 11/16/2021  ? HCT 38.6 11/16/2021  ? PLT 325 11/16/2021  ? GLUCOSE 92 11/16/2021  ? CHOL 196 10/06/2020  ? TRIG 100.0 10/06/2020  ? HDL 54.30 10/06/2020  ? LDLCALC 121 (H) 10/06/2020  ? ALT 10 11/16/2021  ? AST 11 11/16/2021  ? NA 140 11/16/2021  ? K 4.4 11/16/2021  ? CL 108 11/16/2021  ? CREATININE 0.65 11/16/2021  ? BUN 11 11/16/2021  ? CO2 23 11/16/2021  ? TSH 1.33 10/06/2020  ? HGBA1C 5.0 07/02/2020  ? MICROALBUR 0.6 08/02/2014  ? ? ?Lab Results  ?Component Value Date  ? TSH 1.33 10/06/2020  ? ?Lab Results  ?Component Value Date  ? WBC 6.0 11/16/2021  ? HGB 12.8 11/16/2021  ? HCT 38.6 11/16/2021  ? MCV 89.8 11/16/2021  ? PLT 325 11/16/2021  ? ?Lab Results  ?Component Value Date  ? NA 140 11/16/2021  ? K 4.4 11/16/2021  ? CO2 23 11/16/2021  ? GLUCOSE 92 11/16/2021  ? BUN 11 11/16/2021  ? CREATININE 0.65 11/16/2021  ? BILITOT 0.4 11/16/2021  ? ALKPHOS 75 10/06/2020  ? AST 11 11/16/2021  ? ALT 10 11/16/2021  ? PROT 6.8 11/16/2021  ? ALBUMIN 4.2 10/06/2020  ? CALCIUM 9.3 11/16/2021  ? ANIONGAP 12  07/02/2020  ? GFR 104.64 10/06/2020  ? ?Lab Results  ?Component Value Date  ? CHOL 196 10/06/2020  ? ?Lab Results  ?Component Value Date  ? HDL 54.30 10/06/2020  ? ?Lab Results  ?Component Value Date  ? LDLCALC 121 (H) 02/

## 2021-11-17 LAB — COMPREHENSIVE METABOLIC PANEL
AG Ratio: 1.3 (calc) (ref 1.0–2.5)
ALT: 10 U/L (ref 6–29)
AST: 11 U/L (ref 10–30)
Albumin: 3.9 g/dL (ref 3.6–5.1)
Alkaline phosphatase (APISO): 62 U/L (ref 31–125)
BUN: 11 mg/dL (ref 7–25)
CO2: 23 mmol/L (ref 20–32)
Calcium: 9.3 mg/dL (ref 8.6–10.2)
Chloride: 108 mmol/L (ref 98–110)
Creat: 0.65 mg/dL (ref 0.50–0.96)
Globulin: 2.9 g/dL (calc) (ref 1.9–3.7)
Glucose, Bld: 92 mg/dL (ref 65–99)
Potassium: 4.4 mmol/L (ref 3.5–5.3)
Sodium: 140 mmol/L (ref 135–146)
Total Bilirubin: 0.4 mg/dL (ref 0.2–1.2)
Total Protein: 6.8 g/dL (ref 6.1–8.1)

## 2021-11-17 LAB — CBC WITH DIFFERENTIAL/PLATELET
Absolute Monocytes: 624 cells/uL (ref 200–950)
Basophils Absolute: 30 cells/uL (ref 0–200)
Basophils Relative: 0.5 %
Eosinophils Absolute: 162 cells/uL (ref 15–500)
Eosinophils Relative: 2.7 %
HCT: 38.6 % (ref 35.0–45.0)
Hemoglobin: 12.8 g/dL (ref 11.7–15.5)
Lymphs Abs: 2610 cells/uL (ref 850–3900)
MCH: 29.8 pg (ref 27.0–33.0)
MCHC: 33.2 g/dL (ref 32.0–36.0)
MCV: 89.8 fL (ref 80.0–100.0)
MPV: 9.9 fL (ref 7.5–12.5)
Monocytes Relative: 10.4 %
Neutro Abs: 2574 cells/uL (ref 1500–7800)
Neutrophils Relative %: 42.9 %
Platelets: 325 10*3/uL (ref 140–400)
RBC: 4.3 10*6/uL (ref 3.80–5.10)
RDW: 13.2 % (ref 11.0–15.0)
Total Lymphocyte: 43.5 %
WBC: 6 10*3/uL (ref 3.8–10.8)

## 2021-11-20 LAB — H. PYLORI BREATH TEST: H. pylori Breath Test: NOT DETECTED

## 2021-11-23 ENCOUNTER — Other Ambulatory Visit (HOSPITAL_COMMUNITY): Payer: Self-pay

## 2021-11-24 ENCOUNTER — Other Ambulatory Visit (HOSPITAL_COMMUNITY)
Admission: RE | Admit: 2021-11-24 | Discharge: 2021-11-24 | Disposition: A | Discharge status: Home / Self Care | Payer: No Typology Code available for payment source | Source: Ambulatory Visit | Attending: Family Medicine | Admitting: Family Medicine

## 2021-11-24 ENCOUNTER — Other Ambulatory Visit (HOSPITAL_BASED_OUTPATIENT_CLINIC_OR_DEPARTMENT_OTHER): Payer: Self-pay

## 2021-11-24 ENCOUNTER — Encounter: Payer: Self-pay | Admitting: Family Medicine

## 2021-11-24 ENCOUNTER — Ambulatory Visit (INDEPENDENT_AMBULATORY_CARE_PROVIDER_SITE_OTHER): Payer: No Typology Code available for payment source | Admitting: Family Medicine

## 2021-11-24 VITALS — BP 118/76 | HR 109 | Temp 99.0°F | Resp 18 | Ht 64.0 in | Wt 225.0 lb

## 2021-11-24 DIAGNOSIS — N898 Other specified noninflammatory disorders of vagina: Secondary | ICD-10-CM | POA: Diagnosis present

## 2021-11-24 DIAGNOSIS — J40 Bronchitis, not specified as acute or chronic: Secondary | ICD-10-CM | POA: Diagnosis not present

## 2021-11-24 MED ORDER — AZITHROMYCIN 250 MG PO TABS
ORAL_TABLET | ORAL | 0 refills | Status: DC
  Filled 2021-11-24: qty 6, 5d supply, fill #0

## 2021-11-24 NOTE — Patient Instructions (Signed)
Acute Bronchitis, Adult ? ?Acute bronchitis is sudden inflammation of the main airways (bronchi) that come off the windpipe (trachea) in the lungs. The swelling causes the airways to get smaller and make more mucus than normal. This can make it hard to breathe and can cause coughing or noisy breathing (wheezing). ?Acute bronchitis may last several weeks. The cough may last longer. Allergies, asthma, and exposure to smoke may make the condition worse. ?What are the causes? ?This condition can be caused by germs and by substances that irritate the lungs, including: ?Cold and flu viruses. The most common cause of this condition is the virus that causes the common cold. ?Bacteria. This is less common. ?Breathing in substances that irritate the lungs, including: ?Smoke from cigarettes and other forms of tobacco. ?Dust and pollen. ?Fumes from household cleaning products, gases, or burned fuel. ?Indoor or outdoor air pollution. ?What increases the risk? ?The following factors may make you more likely to develop this condition: ?A weak body's defense system, also called the immune system. ?A condition that affects your lungs and breathing, such as asthma. ?What are the signs or symptoms? ?Common symptoms of this condition include: ?Coughing. This may bring up clear, yellow, or green mucus from your lungs (sputum). ?Wheezing. ?Runny or stuffy nose. ?Having too much mucus in your lungs (chest congestion). ?Shortness of breath. ?Aches and pains, including sore throat or chest. ?How is this diagnosed? ?This condition is usually diagnosed based on: ?Your symptoms and medical history. ?A physical exam. ?You may also have other tests, including tests to rule out other conditions, such as pneumonia. These tests include: ?A test of lung function. ?Test of a mucus sample to look for the presence of bacteria. ?Tests to check the oxygen level in your blood. ?Blood tests. ?Chest X-ray. ?How is this treated? ?Most cases of acute  bronchitis clear up over time without treatment. Your health care provider may recommend: ?Drinking more fluids to help thin your mucus so it is easier to cough up. ?Taking inhaled medicine (inhaler) to improve air flow in and out of your lungs. ?Using a vaporizer or a humidifier. These are machines that add water to the air to help you breathe better. ?Taking a medicine that thins mucus and clears congestion (expectorant). ?Taking a medicine that prevents or stops coughing (cough suppressant). ?It is notcommon to take an antibiotic medicine for this condition. ?Follow these instructions at home: ? ?Take over-the-counter and prescription medicines only as told by your health care provider. ?Use an inhaler, vaporizer, or humidifier as told by your health care provider. ?Take two teaspoons (10 mL) of honey at bedtime to lessen coughing at night. ?Drink enough fluid to keep your urine pale yellow. ?Do not use any products that contain nicotine or tobacco. These products include cigarettes, chewing tobacco, and vaping devices, such as e-cigarettes. If you need help quitting, ask your health care provider. ?Get plenty of rest. ?Return to your normal activities as told by your health care provider. Ask your health care provider what activities are safe for you. ?Keep all follow-up visits. This is important. ?How is this prevented? ?To lower your risk of getting this condition again: ?Wash your hands often with soap and water for at least 20 seconds. If soap and water are not available, use hand sanitizer. ?Avoid contact with people who have cold symptoms. ?Try not to touch your mouth, nose, or eyes with your hands. ?Avoid breathing in smoke or chemical fumes. Breathing smoke or chemical fumes will make your   condition worse. ?Get the flu shot every year. ?Contact a health care provider if: ?Your symptoms do not improve after 2 weeks. ?You have trouble coughing up the mucus. ?Your cough keeps you awake at night. ?You have a  fever. ?Get help right away if you: ?Cough up blood. ?Feel pain in your chest. ?Have severe shortness of breath. ?Faint or keep feeling like you are going to faint. ?Have a severe headache. ?Have a fever or chills that get worse. ?These symptoms may represent a serious problem that is an emergency. Do not wait to see if the symptoms will go away. Get medical help right away. Call your local emergency services (911 in the [place] Do not drive yourself to the hospital. ?Summary ?Acute bronchitis is inflammation of the main airways (bronchi) that come off the windpipe (trachea) in the lungs. The swelling causes the airways to get smaller and make more mucus than normal. ?Drinking more fluids can help thin your mucus so it is easier to cough up. ?Take over-the-counter and prescription medicines only as told by your health care provider. ?Do not use any products that contain nicotine or tobacco. These products include cigarettes, chewing tobacco, and vaping devices, such as e-cigarettes. If you need help quitting, ask your health care provider. ?Contact a health care provider if your symptoms do not improve after 2 weeks. ?This information is not intended to replace advice given to you by your health care provider. Make sure you discuss any questions you have with your health care provider. ?Document Revised: 11/30/2020 Document Reviewed: 11/30/2020 ?Elsevier Patient Education ? 2023 [employer]vier Inc[employer] ? ?

## 2021-11-24 NOTE — Assessment & Plan Note (Signed)
Self swab done ?Results pending ?

## 2021-11-24 NOTE — Progress Notes (Addendum)
? ?Subjective:  ? ?By signing my name below, I, Zite Okoli, attest that this documentation has been prepared under the direction and in the presence of  Ann Held, DO. 11/24/2021 ? ? Patient ID: [name], female    DOB: 08-15-1995, 26 y.o.   MRN: [id number] ? ?Chief Complaint  ?Patient presents with  ? Cough  ?  Pt states sxs started a couple of days after her last appt. Pt states productive cough and congestion. Negative COVID test. Pt concerned because of asthma.   ? ? ?HPI ?Patient is in today for an office visit. ? ?She reports she has had a productive cough and sore throat and has been coughing up mucus for the past week. Denies congestion, wheezing, dyspnea, fevers and chest tightness. The cough occurs mostly at night. Her mum is concerned because last night, she coughed up mucus and chunks of food. The cough does not keep her awake at night. She has been drinking tea and coffee and taking OTC medication which relieves the cough. ? ?She reports she has been having vaginal discharge. She used valtrex but it made her horny and did not help. Denies UTI symptoms. She has not been having sex recently but is sexually active and lives with her boyfriend.  ? ?Past Medical History:  ?Diagnosis Date  ? Acute vaginitis 05/20/2019  ? ADD (attention deficit disorder) 04/24/2011  ? ADHD (attention deficit hyperactivity disorder)   ? Allergy   ? Anxiety   ? Benzodiazepine dependence ([place]) 05/12/2020  ? Bipolar disorder ([place])   ? Bipolar disorder, current episode manic severe with psychotic features ([place])   ? Bipolar I disorder ([place]) 05/12/2020  ? Constipation 04/03/2018  ? Delirium tremens ([place]) 05/20/2019  ? Depression   ? Eczema 12/11/13  ? Genital herpes simplex 10/24/2017  ? High risk heterosexual behavior 05/20/2019  ? MDD (major depressive disorder), recurrent severe, without psychosis ([place]) 02/11/2018  ? Morbid obesity ([place]) 09/06/2019  ? Pelvic pain 01/31/2018  ? Possible pregnancy 09/06/2019  ? Preventative  health care 03/16/2013  ? Schizo-affective schizophrenia ([place])   ? Schizoaffective disorder ([place]) 03/08/2018  ? Seizure ([place]) 05/20/2019  ? Severe bipolar I disorder, current or most recent episode depressed ([place]) 03/30/2017  ? STD exposure 05/13/2015  ? Syncope 03/03/2017  ? Vitamin D deficiency 03/10/2019  ? ? ?No past surgical history on file. ? ?Family History  ?Problem Relation Age of Onset  ? Asthma Mother   ? Stroke Father   ? Heart disease Father   ? Asthma Father   ? Hypertension Father   ?     pulmonary hypertension  ? Heart disease Maternal Uncle   ? Hypertension Maternal Grandmother   ? COPD Maternal Grandmother   ? Diabetes Maternal Grandfather   ? Stroke Paternal Grandmother   ? Diabetes Paternal Grandmother   ? Diabetes Paternal Grandfather   ? Leukemia Maternal Uncle   ? ? ?Social History  ? ?Socioeconomic History  ? Marital status: Single  ?  Spouse name: Not on file  ? Number of children: Not on file  ? Years of education: Not on file  ? Highest education level: Not on file  ?Occupational History  ? Occupation: Unemployed  ?Tobacco Use  ? Smoking status: Never  ? Smokeless tobacco: Never  ?Vaping Use  ? Vaping Use: Former  ? Quit date: 09/01/2017  ?Substance and Sexual Activity  ? Alcohol use: Not Currently  ?  Comment: occasionally  ? Drug use:  Not Currently  ? Sexual activity: Not Currently  ?  Birth control/protection: Pill  ?Other Topics Concern  ? Not on file  ?Social History Narrative  ? Lives with mom in an apartment on the second floor.  No children.  Currently not working.  Education: college. Left handed  ? ?Social Determinants of Health  ? ?Financial Resource Strain: Not on file  ?Food Insecurity: Not on file  ?Transportation Needs: Not on file  ?Physical Activity: Not on file  ?Stress: Not on file  ?Social Connections: Not on file  ?Intimate Partner Violence: Not on file  ? ? ?Outpatient Medications Prior to Visit  ?Medication Sig Dispense Refill  ? azelastine (ASTELIN) 0.1 % nasal spray Place 2  sprays into both nostrils daily as needed for rhinitis or allergies as directed 30 mL 12  ? cetirizine (ZYRTEC) 10 MG tablet Take 1/2 tablet (5 mg total) by mouth daily. 100 tablet 1  ? famotidine (PEPCID) 20 MG tablet Take 1 tablet (20 mg total) by mouth 2 (two) times daily. 60 tablet 2  ? LORazepam (ATIVAN) 1 MG tablet TAKE 1 TABLET BY MOUTH 4 TIMES DAILY. 360 tablet 0  ? norgestimate-ethinyl estradiol (VYLIBRA) 0.25-35 MG-MCG tablet Take 1 tablet by mouth daily. 28 tablet 11  ? paliperidone Palmitate ER (INVEGA TRINZA) 819 MG/2.63ML SUSY injection USE AS DIRECTED EVERY 3 MONTHS 2.63 mL 0  ? propranolol (INDERAL) 20 MG tablet 1 po qd 90 tablet 0  ? QUEtiapine (SEROQUEL) 100 MG tablet Take 1 tablet (100 mg total) by mouth at bedtime. 30 tablet 0  ? triamcinolone (KENALOG) 0.1 %     ? valACYclovir (VALTREX) 1000 MG tablet Take 1 tablet (1,000 mg total) by mouth daily. 30 tablet 5  ? ?No facility-administered medications prior to visit.  ? ? ?Allergies  ?Allergen Reactions  ? Concerta [Methylphenidate] Other (See Comments)  ?  hallucinations  ? Terbinafine Other (See Comments)  ? Trazodone And Nefazodone Other (See Comments)  ?  Pt stated she has nightmares  ? Clindamycin Hcl Rash  ? Haldol [Haloperidol] Anxiety  ?  Unable to seat still  ? Terbinafine Hcl Nausea And Vomiting  ? ? ?Review of Systems  ?Constitutional:  Negative for fever.  ?HENT:  Positive for sore throat. Negative for congestion, ear pain, hearing loss and sinus pain.   ?Eyes:  Negative for blurred vision and pain.  ?Respiratory:  Positive for cough and sputum production. Negative for shortness of breath and wheezing.   ?Cardiovascular:  Negative for chest pain and palpitations.  ?Gastrointestinal:  Negative for blood in stool, constipation, diarrhea, nausea and vomiting.  ?Genitourinary:  Negative for dysuria, frequency, hematuria and urgency.  ?     (+) vaginal discharge   ?Musculoskeletal:  Negative for back pain, falls and myalgias.   ?Neurological:  Negative for dizziness, sensory change, loss of consciousness, weakness and headaches.  ?Endo/Heme/Allergies:  Negative for environmental allergies. Does not bruise/bleed easily.  ?Psychiatric/Behavioral:  Negative for depression and suicidal ideas. The patient is not nervous/anxious and does not have insomnia.   ? ?   ?Objective:  ?  ?Physical Exam ?Constitutional:   ?   General: She is not in acute distress. ?   Appearance: Normal appearance. She is not ill-appearing.  ?HENT:  ?   Head: Normocephalic and atraumatic.  ?   Right Ear: External ear normal.  ?   Left Ear: External ear normal.  ?Eyes:  ?   Extraocular Movements: Extraocular movements intact.  ?  Pupils: Pupils are equal, round, and reactive to light.  ?Cardiovascular:  ?   Rate and Rhythm: Normal rate and regular rhythm.  ?   Pulses: Normal pulses.  ?   Heart sounds: Normal heart sounds. No murmur heard. ?  No gallop.  ?Pulmonary:  ?   Effort: Pulmonary effort is normal. No respiratory distress.  ?   Breath sounds: Normal breath sounds. No wheezing, rhonchi or rales.  ?Abdominal:  ?   General: Bowel sounds are normal. There is no distension.  ?   Palpations: Abdomen is soft. There is no mass.  ?   Tenderness: There is no abdominal tenderness. There is no guarding or rebound.  ?   Hernia: No hernia is present.  ?Musculoskeletal:  ?   Cervical back: Normal range of motion and neck supple.  ?Lymphadenopathy:  ?   Cervical: No cervical adenopathy.  ?Skin: ?   General: Skin is warm and dry.  ?Neurological:  ?   Mental Status: She is alert and oriented to person, place, and time.  ?Psychiatric:     ?   Behavior: Behavior normal.  ? ? ?BP 118/76 (BP Location: Left Arm, Patient Position: Sitting, Cuff Size: Large)   Pulse (!) 109   Temp 99 ?F (37.2 ?C) (Oral)   Resp 18   Ht 5\' 4"  (1.626 m)   Wt 225 lb (102.1 kg)   SpO2 98%   BMI 38.62 kg/m?  ?Wt Readings from Last 3 Encounters:  ?11/24/21 225 lb (102.1 kg)  ?11/16/21 221 lb 6.4 oz  (100.4 kg)  ?10/10/21 219 lb (99.3 kg)  ? ? ?Diabetic Foot Exam - Simple   ?No data filed ?  ? ?Lab Results  ?Component Value Date  ? WBC 6.0 11/16/2021  ? HGB 12.8 11/16/2021  ? HCT 38.6 11/16/2021  ? PLT 325 11/16/2021  ? GLUC

## 2021-11-24 NOTE — Assessment & Plan Note (Signed)
abx per orders  ?Cough med ?F/u prn  ?

## 2021-11-25 ENCOUNTER — Other Ambulatory Visit (HOSPITAL_COMMUNITY): Payer: Self-pay

## 2021-11-25 MED ORDER — BUPROPION HCL ER (XL) 150 MG PO TB24
ORAL_TABLET | ORAL | 0 refills | Status: DC
  Filled 2021-11-25 – 2022-02-16 (×2): qty 90, 90d supply, fill #0

## 2021-11-25 MED ORDER — BUPROPION HCL ER (XL) 150 MG PO TB24
ORAL_TABLET | ORAL | 0 refills | Status: DC
  Filled 2021-11-25: qty 90, 90d supply, fill #0

## 2021-11-27 LAB — CERVICOVAGINAL ANCILLARY ONLY
Bacterial Vaginitis (gardnerella): NEGATIVE
Candida Glabrata: NEGATIVE
Candida Vaginitis: NEGATIVE
Chlamydia: NEGATIVE
Comment: NEGATIVE
Comment: NEGATIVE
Comment: NEGATIVE
Comment: NEGATIVE
Comment: NEGATIVE
Comment: NORMAL
Neisseria Gonorrhea: NEGATIVE
Trichomonas: NEGATIVE

## 2021-11-28 ENCOUNTER — Other Ambulatory Visit (HOSPITAL_BASED_OUTPATIENT_CLINIC_OR_DEPARTMENT_OTHER): Payer: Self-pay

## 2021-11-28 MED ORDER — INVEGA HAFYERA 1560 MG/5ML IM SUSY
PREFILLED_SYRINGE | INTRAMUSCULAR | 1 refills | Status: DC
  Filled 2021-11-28: qty 1, 180d supply, fill #0
  Filled 2021-11-28: qty 5, 120d supply, fill #0

## 2021-11-29 ENCOUNTER — Other Ambulatory Visit (HOSPITAL_BASED_OUTPATIENT_CLINIC_OR_DEPARTMENT_OTHER): Payer: Self-pay

## 2021-11-30 ENCOUNTER — Other Ambulatory Visit: Payer: Self-pay

## 2021-11-30 ENCOUNTER — Other Ambulatory Visit (HOSPITAL_BASED_OUTPATIENT_CLINIC_OR_DEPARTMENT_OTHER): Payer: Self-pay

## 2021-11-30 ENCOUNTER — Other Ambulatory Visit (HOSPITAL_COMMUNITY): Payer: Self-pay

## 2021-11-30 ENCOUNTER — Telehealth: Payer: Self-pay | Admitting: Cardiology

## 2021-11-30 MED ORDER — PROPRANOLOL HCL 20 MG PO TABS: ORAL_TABLET | ORAL | 0 refills | Status: DC

## 2021-11-30 NOTE — Telephone Encounter (Signed)
?*  STAT* If patient is at the pharmacy, call can be transferred to refill team. ? ? ?1. Which medications need to be refilled? (please list name of each medication and dose if known) propranolol (INDERAL) 20 MG tablet ? ?2. Which pharmacy/location (including street and city if local pharmacy) is medication to be sent to? [facility] ? ?3. Do they need a 30 day or 90 day supply?  30 day ? ?Patient is out of meds.  ?

## 2021-11-30 NOTE — Telephone Encounter (Signed)
Called patient to advised medication refill sent to pharmacy. ?

## 2021-12-04 ENCOUNTER — Other Ambulatory Visit (HOSPITAL_BASED_OUTPATIENT_CLINIC_OR_DEPARTMENT_OTHER): Payer: Self-pay

## 2021-12-04 ENCOUNTER — Other Ambulatory Visit: Payer: Self-pay | Admitting: Cardiology

## 2021-12-05 ENCOUNTER — Other Ambulatory Visit (HOSPITAL_BASED_OUTPATIENT_CLINIC_OR_DEPARTMENT_OTHER): Payer: Self-pay

## 2021-12-07 ENCOUNTER — Other Ambulatory Visit (HOSPITAL_COMMUNITY): Payer: Self-pay

## 2021-12-07 MED ORDER — PROPRANOLOL HCL 20 MG PO TABS
ORAL_TABLET | ORAL | 0 refills | Status: DC
  Filled 2021-12-07: qty 90, 30d supply, fill #0

## 2021-12-07 NOTE — Telephone Encounter (Signed)
Refill done as requested ./cy 

## 2021-12-07 NOTE — Addendum Note (Signed)
Addended by: Scherrie Bateman E on: 12/07/2021 11:26 AM ? ? Modules accepted: Orders ? ?

## 2021-12-07 NOTE — Telephone Encounter (Signed)
Patient's mother called.  Please send script to pharmacy.  Please sent it to [facility].   ?

## 2021-12-15 ENCOUNTER — Other Ambulatory Visit (HOSPITAL_BASED_OUTPATIENT_CLINIC_OR_DEPARTMENT_OTHER): Payer: Self-pay

## 2021-12-15 ENCOUNTER — Other Ambulatory Visit (HOSPITAL_COMMUNITY): Payer: Self-pay

## 2021-12-15 ENCOUNTER — Encounter: Payer: Self-pay | Admitting: Cardiology

## 2021-12-15 ENCOUNTER — Ambulatory Visit (INDEPENDENT_AMBULATORY_CARE_PROVIDER_SITE_OTHER): Payer: No Typology Code available for payment source | Admitting: Cardiology

## 2021-12-15 VITALS — BP 120/78 | HR 88 | Ht 64.0 in | Wt 221.6 lb

## 2021-12-15 DIAGNOSIS — R000 Tachycardia, unspecified: Secondary | ICD-10-CM

## 2021-12-15 DIAGNOSIS — R002 Palpitations: Secondary | ICD-10-CM | POA: Diagnosis not present

## 2021-12-15 DIAGNOSIS — I4711 Inappropriate sinus tachycardia, so stated: Secondary | ICD-10-CM

## 2021-12-15 MED ORDER — PROPRANOLOL HCL 10 MG PO TABS
5.0000 mg | ORAL_TABLET | Freq: Every day | ORAL | 3 refills | Status: DC
  Filled 2021-12-15: qty 45, 90d supply, fill #0
  Filled 2022-02-16: qty 45, 90d supply, fill #1

## 2021-12-15 MED ORDER — PROPRANOLOL HCL 10 MG PO TABS
10.0000 mg | ORAL_TABLET | Freq: Every day | ORAL | 3 refills | Status: DC
  Filled 2021-12-15: qty 90, 90d supply, fill #0

## 2021-12-15 MED ORDER — PROPRANOLOL HCL 10 MG PO TABS
5.0000 mg | ORAL_TABLET | Freq: Every day | ORAL | 3 refills | Status: DC
  Filled 2021-12-15: qty 45, 90d supply, fill #0

## 2021-12-15 NOTE — Patient Instructions (Addendum)
Medication Instructions:  ?Your physician has recommended you make the following change in your medication:  ?DECREASE: Propanolol 5 mg (half tablet) once daily ?*If you need a refill on your cardiac medications before your next appointment, please call your pharmacy* ? ? ?Lab Work: ?None ?If you have labs (blood work) drawn today and your tests are completely normal, you will receive your results only by: ?MyChart Message (if you have MyChart) OR ?A paper copy in the mail ?If you have any lab test that is abnormal or we need to change your treatment, we will call you to review the results. ? ? ?Testing/Procedures: ?None ? ? ?Follow-Up: ?At [facility], you and your health needs are our priority.  As part of our continuing mission to provide you with exceptional heart care, we have created designated Provider Care Teams.  These Care Teams include your primary Cardiologist (physician) and Advanced Practice Providers (APPs -  Physician Assistants and Nurse Practitioners) who all work together to provide you with the care you need, when you need it. ? ?We recommend signing up for the patient portal called "MyChart".  Sign up information is provided on this After Visit Summary.  MyChart is used to connect with patients for Virtual Visits (Telemedicine).  Patients are able to view lab/test results, encounter notes, upcoming appointments, etc.  Non-urgent messages can be sent to your provider as well.   ?To learn more about what you can do with MyChart, go to [email or web address].   ? ?Your next appointment:   ?1 year(s) ? ?The format for your next appointment:   ?In Person ? ?Provider:   ?Thomasene Ripple, DO   ? ? ?Other Instructions ? ? ?Important Information About Sugar ? ? ? ? ?  ?

## 2021-12-15 NOTE — Progress Notes (Signed)
?Cardiology Office Note:   ? ?Date:  12/15/2021  ? ?ID:  [name], DOB 1995/09/28, MRN [id number] ? ?PCP:  [name], DO  ?Cardiologist:  Thomasene Ripple, DO  ?Electrophysiologist:  None  ? ?Referring MD: Zola Button, Grayling Congress, *  ? ?" I am doing much better" ? ?History of Present Illness:   ? ?[name] is a 26 y.o. female with a hx of anxiety, bipolar disorder who is here today for follow-up visit.  The patient is with her her mother who is her primary caretaker.  ? ?Her last visit was August 31, 2020 I kept her on propanolol.  Since I saw the patient she had been managing with the propanolol.  But recently her mom tells me that her blood pressure has been lower on the propanolol so they had held it. ? ? ?Past Medical History:  ?Diagnosis Date  ? Acute vaginitis 05/20/2019  ? ADD (attention deficit disorder) 04/24/2011  ? ADHD (attention deficit hyperactivity disorder)   ? Allergy   ? Anxiety   ? Benzodiazepine dependence (HCC) 05/12/2020  ? Bipolar disorder (HCC)   ? Bipolar disorder, current episode manic severe with psychotic features (HCC)   ? Bipolar I disorder (HCC) 05/12/2020  ? Constipation 04/03/2018  ? Delirium tremens (HCC) 05/20/2019  ? Depression   ? Eczema 12/11/13  ? Genital herpes simplex 10/24/2017  ? High risk heterosexual behavior 05/20/2019  ? MDD (major depressive disorder), recurrent severe, without psychosis (HCC) 02/11/2018  ? Morbid obesity (HCC) 09/06/2019  ? Pelvic pain 01/31/2018  ? Possible pregnancy 09/06/2019  ? Preventative health care 03/16/2013  ? Schizo-affective schizophrenia (HCC)   ? Schizoaffective disorder (HCC) 03/08/2018  ? Seizure (HCC) 05/20/2019  ? Severe bipolar I disorder, current or most recent episode depressed (HCC) 03/30/2017  ? STD exposure 05/13/2015  ? Syncope 03/03/2017  ? Vitamin D deficiency 03/10/2019  ? ? ?No past surgical history on file. ? ?Current Medications: ?Current Meds  ?Medication Sig  ? azelastine (ASTELIN) 0.1 % nasal spray Place 2 sprays into  both nostrils daily as needed for rhinitis or allergies as directed  ? buPROPion (WELLBUTRIN XL) 150 MG 24 hr tablet Take 1 tablet by mouth each morning  ? buPROPion (WELLBUTRIN XL) 150 MG 24 hr tablet Take 1 tablet by mouth each morning  ? cetirizine (ZYRTEC) 10 MG tablet Take 1/2 tablet (5 mg total) by mouth daily.  ? famotidine (PEPCID) 20 MG tablet Take 1 tablet (20 mg total) by mouth 2 (two) times daily. (Patient taking differently: Take 20 mg by mouth as needed.)  ? LORazepam (ATIVAN) 1 MG tablet TAKE 1 TABLET BY MOUTH 4 TIMES DAILY. (Patient taking differently: Take 1 mg by mouth as needed.)  ? norgestimate-ethinyl estradiol (VYLIBRA) 0.25-35 MG-MCG tablet Take 1 tablet by mouth daily.  ? Paliperidone Palmitate ER (INVEGA HAFYERA) 1560 MG/5ML SUSY Inject 1 syringe intramuscularly every 6 months  ? paliperidone Palmitate ER (INVEGA TRINZA) 819 MG/2.63ML SUSY injection USE AS DIRECTED EVERY 3 MONTHS  ? QUEtiapine (SEROQUEL) 100 MG tablet Take 1 tablet (100 mg total) by mouth at bedtime.  ? triamcinolone (KENALOG) 0.1 %   ? valACYclovir (VALTREX) 1000 MG tablet Take 1 tablet (1,000 mg total) by mouth daily. (Patient taking differently: Take 1,000 mg by mouth as needed.)  ? [DISCONTINUED] propranolol (INDERAL) 10 MG tablet Take 1 tablet (10 mg total) by mouth daily.  ?  ? ?Allergies:   Concerta [methylphenidate], Terbinafine, Trazodone and nefazodone, Clindamycin  hcl, Haldol [haloperidol], and Terbinafine hcl  ? ?Social History  ? ?Socioeconomic History  ? Marital status: Single  ?  Spouse name: Not on file  ? Number of children: Not on file  ? Years of education: Not on file  ? Highest education level: Not on file  ?Occupational History  ? Occupation: Unemployed  ?Tobacco Use  ? Smoking status: Never  ? Smokeless tobacco: Never  ?Vaping Use  ? Vaping Use: Former  ? Quit date: 09/01/2017  ?Substance and Sexual Activity  ? Alcohol use: Not Currently  ?  Comment: occasionally  ? Drug use: Not Currently  ? Sexual  activity: Not Currently  ?  Birth control/protection: Pill  ?Other Topics Concern  ? Not on file  ?Social History Narrative  ? Lives with mom in an apartment on the second floor.  No children.  Currently not working.  Education: college. Left handed  ? ?Social Determinants of Health  ? ?Financial Resource Strain: Not on file  ?Food Insecurity: Not on file  ?Transportation Needs: Not on file  ?Physical Activity: Not on file  ?Stress: Not on file  ?Social Connections: Not on file  ?  ? ?Family History: ?The patient's family history includes Asthma in her father and mother; COPD in her maternal grandmother; Diabetes in her maternal grandfather, paternal grandfather, and paternal grandmother; Heart disease in her father and maternal uncle; Hypertension in her father and maternal grandmother; Leukemia in her maternal uncle; Stroke in her father and paternal grandmother. ? ?ROS:   ?Review of Systems  ?Constitution: Negative for decreased appetite, fever and weight gain.  ?HENT: Negative for congestion, ear discharge, hoarse voice and sore throat.   ?Eyes: Negative for discharge, redness, vision loss in right eye and visual halos.  ?Cardiovascular: Negative for chest pain, dyspnea on exertion, leg swelling, orthopnea and palpitations.  ?Respiratory: Negative for cough, hemoptysis, shortness of breath and snoring.   ?Endocrine: Negative for heat intolerance and polyphagia.  ?Hematologic/Lymphatic: Negative for bleeding problem. Does not bruise/bleed easily.  ?Skin: Negative for flushing, nail changes, rash and suspicious lesions.  ?Musculoskeletal: Negative for arthritis, joint pain, muscle cramps, myalgias, neck pain and stiffness.  ?Gastrointestinal: Negative for abdominal pain, bowel incontinence, diarrhea and excessive appetite.  ?Genitourinary: Negative for decreased libido, genital sores and incomplete emptying.  ?Neurological: Negative for brief paralysis, focal weakness, headaches and loss of balance.   ?Psychiatric/Behavioral: Negative for altered mental status, depression and suicidal ideas.  ?Allergic/Immunologic: Negative for HIV exposure and persistent infections.  ? ? ?EKGs/Labs/Other Studies Reviewed:   ? ?The following studies were reviewed today: ? ? ?EKG:  The ekg ordered today demonstrates sinus rhythm ? ?Recent Labs: ?11/16/2021: ALT 10; BUN 11; Creat 0.65; Hemoglobin 12.8; Platelets 325; Potassium 4.4; Sodium 140  ?Recent Lipid Panel ?   ?Component Value Date/Time  ? CHOL 196 10/06/2020 1128  ? TRIG 100.0 10/06/2020 1128  ? HDL 54.30 10/06/2020 1128  ? CHOLHDL 4 10/06/2020 1128  ? VLDL 20.0 10/06/2020 1128  ? LDLCALC 121 (H) 10/06/2020 1128  ? ? ?Physical Exam:   ? ?VS:  BP 120/78   Pulse 88   Ht 5\' 4"  (1.626 m)   Wt 221 lb 9.6 oz (100.5 kg)   LMP 12/04/2021   SpO2 98%   BMI 38.04 kg/m?    ? ?Wt Readings from Last 3 Encounters:  ?12/15/21 221 lb 9.6 oz (100.5 kg)  ?11/24/21 225 lb (102.1 kg)  ?11/16/21 221 lb 6.4 oz (100.4 kg)  ?  ? ?  GEN: Well nourished, well developed in no acute distress ?HEENT: Normal ?NECK: No JVD; No carotid bruits ?LYMPHATICS: No lymphadenopathy ?CARDIAC: S1S2 noted,RRR, no murmurs, rubs, gallops ?RESPIRATORY:  Clear to auscultation without rales, wheezing or rhonchi  ?ABDOMEN: Soft, non-tender, non-distended, +bowel sounds, no guarding. ?EXTREMITIES: No edema, No cyanosis, no clubbing ?MUSCULOSKELETAL:  No deformity  ?SKIN: Warm and dry ?NEUROLOGIC:  Alert and oriented x 3, non-focal ?PSYCHIATRIC:  Normal affect, good insight ? ?ASSESSMENT:   ? ?1. Palpitations   ?2. Inappropriate sinus tachycardia   ?3. Morbid obesity (HCC)   ? ?PLAN:   ? ?Because she is having low blood pressure with the propanolol 20 mg of blood and decrease her medicine to 10 mg daily she will start by splitting this in half and then full dose if her bp can tolerate. ? ?The patient understands the need to lose weight with diet and exercise. We have discussed specific strategies for this. ? ?The patient  is in agreement with the above plan. The patient left the office in stable condition.  The patient will follow up in 1 year. ? ? ?Medication Adjustments/Labs and Tests Ordered: ?Current medicines are reviewed at length with

## 2021-12-18 ENCOUNTER — Ambulatory Visit (HOSPITAL_COMMUNITY)
Admission: RE | Admit: 2021-12-18 | Discharge: 2021-12-18 | Disposition: A | Discharge status: Home / Self Care | Payer: Medicare Other | Attending: Psychiatry | Admitting: Psychiatry

## 2021-12-19 NOTE — H&P (Signed)
Behavioral Health Medical Screening Exam ? ?[name] is a 26 y.o. female with a history of schizoaffective disorder and anxiety who presents to [facility] voluntarily as a walk-in with her mother Liam Bahn), who is her legal guardian. Patient reports that she took #6 (10 mg) propanolol on Saturday as a suicide attempt. Patient states that she did not tell her mother until this morning. Patient's mother reports that she has secured the patient's medications. Patient denies current suicidal ideations.Patient states that she feels alone at times. She states that she thinks getting out of the house for therapy and other activities would be helpful. However, she states that when she hears others talking in public she feels they may be talking about her and this prevents her from going out.  ? ?Patient reports that she is followed by Dr. Toy Care for medication management. States that she does not have a therapist since her therapist retired. Patient's mother reports that the patient missed a dose of her Invega injection in November. She currently receives the 3 month injection and states that the plan is for the patient to start the Invega 6 month injection in June/July.  ? ?On evaluation patient is alert and oriented x 4. She is very pleasant and cooperative. She is well groomed. Speech is clear and coherent. She smiles appropriately throughout the assessment. Mood is euthymic and affect is congruent with mood. Thought process is coherent and thought content is logical. Denies current auditory and visual hallucinations. No indication that patient is responding to internal stimuli. Patient reports that voices attempt to convince her that she is a singer and/or celebrity. She states that voices recently told that her that she is J. Cole's new girlfriend. Patient displays a great amount of insight into the hallucinations and her disorder. She denies current suicidal ideations. She denies homicidal ideations.  ? ? ? ?Total  Time spent with patient: 45 minutes ? ?Psychiatric Specialty Exam: ? ?Presentation  ?General Appearance: Appropriate for Environment; Well Groomed ? ?Eye Contact:Good ? ?Speech:Clear and Coherent; Normal Rate ? ?Speech Volume:Normal ? ?Handedness:Right ? ? ?Mood and Affect  ?Mood:Euthymic ? ?Affect:Congruent ? ? ?Thought Process  ?Thought Processes:Coherent; Goal Directed; Linear ? ?Descriptions of Associations:Intact ? ?Orientation:Full (Time, Place and Person) ? ?Thought Content:Logical ? ?History of Schizophrenia/Schizoaffective disorder:Yes ? ?Duration of Psychotic Symptoms:Greater than six months ? ?Hallucinations:Hallucinations: Auditory ? ?Ideas of Reference:None ? ?Suicidal Thoughts:Suicidal Thoughts: No ? ?Homicidal Thoughts:Homicidal Thoughts: No ? ? ?Sensorium  ?Memory:Immediate Good; Recent Good; Remote Good ? ?Judgment:Fair ? ?Insight:Fair ? ? ?Executive Functions  ?Concentration:Good ? ?Attention Span:Good ? ?Recall:Good ? ?Fund of [place] ? ?Language:Good ? ? ?Psychomotor Activity  ?Psychomotor Activity:Psychomotor Activity: Normal ? ? ?Assets  ?Assets:Communication Skills; Desire for Improvement; Financial Resources/Insurance; Housing; Physical Health; Social Support ? ? ?Sleep  ?Sleep:Sleep: Good ? ? ? ?Physical Exam: ?Physical Exam ?Constitutional:   ?   General: She is not in acute distress. ?   Appearance: She is not ill-appearing, toxic-appearing or diaphoretic.  ?HENT:  ?   Right Ear: External ear normal.  ?   Left Ear: External ear normal.  ?Eyes:  ?   General:     ?   Right eye: No discharge.     ?   Left eye: No discharge.  ?   Pupils: Pupils are equal, round, and reactive to light.  ?Cardiovascular:  ?   Rate and Rhythm: Normal rate.  ?Pulmonary:  ?   Effort: Pulmonary effort is normal. No respiratory distress.  ?Musculoskeletal:     ?  General: Normal range of motion.  ?   Cervical back: Normal range of motion.  ?Neurological:  ?   Mental Status: She is alert and oriented to  person, place, and time.  ?Psychiatric:     ?   Mood and Affect: Mood is not anxious or depressed.     ?   Behavior: Behavior is cooperative.     ?   Thought Content: Thought content is not paranoid or delusional. Thought content does not include homicidal or suicidal ideation.  ? ?Review of Systems  ?Constitutional:  Negative for chills, diaphoresis, fever, malaise/fatigue and weight loss.  ?HENT:  Negative for congestion.   ?Respiratory:  Negative for cough and shortness of breath.   ?Cardiovascular:  Negative for chest pain and palpitations.  ?Gastrointestinal:  Negative for diarrhea, nausea and vomiting.  ?Neurological:  Negative for dizziness and seizures.  ?Psychiatric/Behavioral:  Positive for depression, hallucinations and suicidal ideas. Negative for memory loss and substance abuse. The patient is nervous/anxious. The patient does not have insomnia.   ?All other systems reviewed and are negative. ? ?Blood pressure 112/71, pulse 82, temperature 97.7 ?F (36.5 ?C), temperature source Oral, last menstrual period 12/04/2021, SpO2 98 %. There is no height or weight on file to calculate BMI. ? ?Musculoskeletal: ?Strength & Muscle Tone: within normal limits ?Gait & Station: normal ?Patient leans: N/A ? ? ?Recommendations: ? ?Based on my evaluation the patient does not appear to have an emergency medical condition. ? ?Considering that the patient took the propanolol approximately 48 hours prior and did not take an amount exceeding the recommended dose, I do not feel that the patient needs medical clearance. ? ?Disposition: No evidence of imminent risk to self or others at present.   ?Supportive therapy provided about ongoing stressors. ?Discussed crisis plan, support from social network, calling 911, coming to the Emergency Department, and calling Suicide Hotline. ?Patient declines inpatient admission. Patient does not meet IVC criteria. Patient's mother who is a nurse feels safe with the patient returning home.  Patient's mother reports that medications have been secured.    ? ?Patient expresses interest in IOP. Beverely Low with [facility] has sent referral information to [facility].  ? ?Discussed methods to reduce the risk of self-injury or suicide attempts: Frequent conversations regarding unsafe thoughts. Remove all significant sharps. Remove all firearms. Remove all medications, including over-the-counter meds. Consider lockbox for medications and having a responsible person dispense medications until patient has strengthened coping skills. Room checks for sharps or other harmful objects. Secure all chemical substances that can be ingested or inhaled.  ? ? ? ?Rozetta Nunnery, NP ?12/19/2021, 1:13 AM ? ?
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
## 2021-12-20 ENCOUNTER — Telehealth (HOSPITAL_COMMUNITY): Payer: Self-pay | Admitting: Psychiatry

## 2021-12-20 NOTE — Telephone Encounter (Signed)
D:  Patient's mother called re: patient getting into a group program.  Case manager was put on speakerphone so pt could be involved in conversation.  A:  Oriented pt but after reading the assessment from TTS, pt needs a higher level of care than MH-IOP at this time. ?According to pt's mother, pt is requesting a group that meets in-person.  Reiterated to pt and her mother that both PHP and MH-IOP are virtual at this time.  A:  Provided pt and her mother with [facility] information.  Inform PHP. ?

## 2021-12-23 ENCOUNTER — Other Ambulatory Visit: Payer: Self-pay | Admitting: Family Medicine

## 2021-12-23 ENCOUNTER — Other Ambulatory Visit (HOSPITAL_COMMUNITY): Payer: Self-pay

## 2021-12-23 DIAGNOSIS — J302 Other seasonal allergic rhinitis: Secondary | ICD-10-CM

## 2021-12-23 DIAGNOSIS — Z3009 Encounter for other general counseling and advice on contraception: Secondary | ICD-10-CM

## 2021-12-25 ENCOUNTER — Telehealth (HOSPITAL_COMMUNITY): Payer: Self-pay | Admitting: Professional

## 2021-12-25 ENCOUNTER — Other Ambulatory Visit (HOSPITAL_COMMUNITY): Payer: Self-pay

## 2021-12-25 MED ORDER — NORGESTIMATE-ETH ESTRADIOL 0.25-35 MG-MCG PO TABS
1.0000 | ORAL_TABLET | Freq: Every day | ORAL | 11 refills | Status: DC
  Filled 2021-12-25: qty 28, 28d supply, fill #0

## 2021-12-25 MED ORDER — CETIRIZINE HCL 10 MG PO TABS
5.0000 mg | ORAL_TABLET | Freq: Every day | ORAL | 1 refills | Status: DC
  Filled 2021-12-25: qty 100, 200d supply, fill #0

## 2021-12-27 NOTE — Telephone Encounter (Signed)
Cln rtn call. Mother reports pt went to [facility] last week due to [facility] and attempt via taking 6 pills of her medication. Mother has guardianship of pt (paperwork in Epic- scanned 06/08/20). Cln orients. Cln explains confidentiality. Email to be sent to [email or web address]

## 2021-12-28 ENCOUNTER — Other Ambulatory Visit (HOSPITAL_COMMUNITY): Payer: Medicare Other | Attending: Psychiatry | Admitting: Licensed Clinical Social Worker

## 2021-12-28 ENCOUNTER — Encounter (HOSPITAL_COMMUNITY): Payer: Self-pay

## 2021-12-28 DIAGNOSIS — R4589 Other symptoms and signs involving emotional state: Secondary | ICD-10-CM | POA: Insufficient documentation

## 2021-12-28 DIAGNOSIS — F909 Attention-deficit hyperactivity disorder, unspecified type: Secondary | ICD-10-CM | POA: Insufficient documentation

## 2021-12-28 DIAGNOSIS — F3189 Other bipolar disorder: Secondary | ICD-10-CM | POA: Insufficient documentation

## 2021-12-28 DIAGNOSIS — Z7389 Other problems related to life management difficulty: Secondary | ICD-10-CM | POA: Insufficient documentation

## 2021-12-28 DIAGNOSIS — F250 Schizoaffective disorder, bipolar type: Secondary | ICD-10-CM | POA: Insufficient documentation
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
## 2021-12-28 NOTE — Plan of Care (Signed)
  Problem: Depression CCP Problem  1 Learn and Apply Coping Skills to Decrease Depressive Symptoms   Goal: LTG: [name] WILL SCORE LESS THAN 10 ON THE PATIENT HEALTH QUESTIONNAIRE (PHQ-9) Outcome: Not Applicable Goal: STG: [name] WILL ATTEND AT LEAST 80% OF SCHEDULED PHP SESSIONS Outcome: Not Applicable Goal: STG: [name] WILL ATTEND AT LEAST 80% OF SCHEDULED GROUP PSYCHOTHERAPY SESSIONS Outcome: Not Applicable Goal: STG: [name] WILL COMPLETE AT LEAST 80% OF ASSIGNED HOMEWORK Outcome: Not Applicable Goal: STG: Reduce overall depression score by a minimum of 25% on the Patient Health Questionnaire (PHQ-9) or the Montgomery-Asberg Depression Rating Scale (MADRS) Outcome: Not Applicable Goal: STG: [name] WILL IDENTIFY AT LEAST 3 COGNITIVE PATTERNS AND BELIEFS THAT SUPPORT DEPRESSION Outcome: Not Applicable   

## 2021-12-28 NOTE — Plan of Care (Signed)
  Problem: Depression CCP Problem  1 Learn and Apply Coping Skills to Decrease Depressive Symptoms   Goal: LTG: [name] WILL SCORE LESS THAN 10 ON THE PATIENT HEALTH QUESTIONNAIRE (PHQ-9) Outcome: Not Applicable Goal: STG: [name] WILL ATTEND AT LEAST 80% OF SCHEDULED PHP SESSIONS Outcome: Not Applicable Goal: STG: [name] WILL ATTEND AT LEAST 80% OF SCHEDULED GROUP PSYCHOTHERAPY SESSIONS Outcome: Not Applicable Goal: STG: [name] WILL COMPLETE AT LEAST 80% OF ASSIGNED HOMEWORK Outcome: Not Applicable Goal: STG: Reduce overall depression score by a minimum of 25% on the Patient Health Questionnaire (PHQ-9) or the Montgomery-Asberg Depression Rating Scale (MADRS) Outcome: Not Applicable Goal: STG: [name] WILL IDENTIFY AT LEAST 3 COGNITIVE PATTERNS AND BELIEFS THAT SUPPORT DEPRESSION Outcome: Not Applicable

## 2022-01-01 ENCOUNTER — Other Ambulatory Visit (HOSPITAL_BASED_OUTPATIENT_CLINIC_OR_DEPARTMENT_OTHER): Payer: Self-pay

## 2022-01-02 ENCOUNTER — Other Ambulatory Visit (HOSPITAL_COMMUNITY): Payer: Medicare Other

## 2022-01-02 ENCOUNTER — Ambulatory Visit (HOSPITAL_COMMUNITY): Payer: Medicare Other

## 2022-01-03 ENCOUNTER — Other Ambulatory Visit (HOSPITAL_COMMUNITY): Payer: Medicare Other

## 2022-01-03 ENCOUNTER — Ambulatory Visit (HOSPITAL_COMMUNITY): Payer: Medicare Other

## 2022-01-04 ENCOUNTER — Other Ambulatory Visit (HOSPITAL_COMMUNITY): Payer: Medicare Other | Admitting: Licensed Clinical Social Worker

## 2022-01-04 ENCOUNTER — Encounter (HOSPITAL_COMMUNITY): Payer: Self-pay

## 2022-01-04 ENCOUNTER — Other Ambulatory Visit (HOSPITAL_COMMUNITY): Payer: Medicare Other

## 2022-01-04 DIAGNOSIS — F250 Schizoaffective disorder, bipolar type: Secondary | ICD-10-CM | POA: Diagnosis not present

## 2022-01-04 DIAGNOSIS — Z7389 Other problems related to life management difficulty: Secondary | ICD-10-CM | POA: Diagnosis not present

## 2022-01-04 DIAGNOSIS — R4589 Other symptoms and signs involving emotional state: Secondary | ICD-10-CM | POA: Diagnosis present

## 2022-01-04 DIAGNOSIS — F909 Attention-deficit hyperactivity disorder, unspecified type: Secondary | ICD-10-CM | POA: Diagnosis not present

## 2022-01-04 DIAGNOSIS — F3189 Other bipolar disorder: Secondary | ICD-10-CM | POA: Diagnosis not present

## 2022-01-04 NOTE — Therapy (Signed)
[facility] [address] [place], [place], [address] Phone: [phone]   Fax:  [phone]  Occupational Therapy Evaluation Virtual Visit via Video Note  I connected with [name] Nicolosi on 01/04/22 at  8:00 AM EDT by a video enabled telemedicine application and verified that I am speaking with the correct person using two identifiers.  Location: Patient: home Provider: office   I discussed the limitations of evaluation and management by telemedicine and the availability of in person appointments. The patient expressed understanding and agreed to proceed.    The patient was advised to call back or seek an in-person evaluation if the symptoms worsen or if the condition fails to improve as anticipated.  I provided 85 minutes of non-face-to-face time during this encounter.   Patient Details  Name: [name] Rue MRN: [id number] Date of Birth: Nov 09, 1995 No data recorded  Encounter Date: 01/04/2022   OT End of Session - 01/04/22 2049     Visit Number 1    Number of Visits 20    Date for OT Re-Evaluation 02/03/22    Authorization Type MEDICARE PART A AND B    Authorization Time Period working on OT benefits / will update as able    OT Start Time 0915   [phone]: 30 minutes EVAL   OT Stop Time 1255   [id number]: 55 minutes Tx PHP group   OT Time Calculation (min) 220 min    Activity Tolerance Patient tolerated treatment well    Behavior During Therapy [facility] for tasks assessed/performed             Past Medical History:  Diagnosis Date   Acute vaginitis 05/20/2019   ADD (attention deficit disorder) 04/24/2011   ADHD (attention deficit hyperactivity disorder)    Allergy    Anxiety    Benzodiazepine dependence (HCC) 05/12/2020   Bipolar disorder (HCC)    Bipolar disorder, current episode manic severe with psychotic features (HCC)    Bipolar I disorder (HCC) 05/12/2020   Constipation 04/03/2018   Delirium tremens  (HCC) 05/20/2019   Depression    Eczema 12/11/13   Genital herpes simplex 10/24/2017   High risk heterosexual behavior 05/20/2019   MDD (major depressive disorder), recurrent severe, without psychosis (HCC) 02/11/2018   Morbid obesity (HCC) 09/06/2019   Pelvic pain 01/31/2018   Possible pregnancy 09/06/2019   Preventative health care 03/16/2013   Schizo-affective schizophrenia (HCC)    Schizoaffective disorder (HCC) 03/08/2018   Seizure (HCC) 05/20/2019   Severe bipolar I disorder, current or most recent episode depressed (HCC) 03/30/2017   STD exposure 05/13/2015   Syncope 03/03/2017   Vitamin D deficiency 03/10/2019    History reviewed. No pertinent surgical history.  There were no vitals filed for this visit.   Subjective Assessment - 01/04/22 2042     Subjective  "I am not working right now but i want to make tha a goal for the future"    Pertinent History BPD, ADD, MDD, Schizoafffctive disorder, delirium tremens, ADHD    Patient Stated Goals improve psychosocial skills, goal setting, coping skills, time mgmt, socialization skills    Currently in Pain? No/denies    Pain Score 0-No pain    Multiple Pain Sites No              OT Assessment  Diagnosis: ADD, BPD, MDD, ADHD, Schizoaffective, Manic BPD Past medical history/referral information: defer to H&P Living situation: w/ mother / extended family (guardian) ADLs: pt is  independent for all bADLs and [place] however often reports neglecting Kyle d/t Sx's of depression and the lack of motivation associated w/ this Dx Work: NA Leisure: music / singing / writing music / arts and crafts / TV / movie watching Social support: fair and intact w/ guardianship Struggles: iADLs  OT goal: improve psychosocial skills and b/iADL skills  OCAIRS Mental Health Interview Summary of Client Scores:  Facilitates participation in occupation Allows participation in occupation Inhibits participation in occupation Restricts participation in occupation Comments:   Roles  X     Habits   X    Personal Causation  X     Values  X     Interests       Skills   X    Short-Term Goals  X     Long-term Goals X      Interpretation of Past Experiences X      Physical Environment  X     Social Environment  X     Readiness for Change X        Need for Occupational Therapy:      3 Need for minimal intervention/consultative participation          Assessment:  Patient demonstrates behavior that ALLOWS participation in occupation.  Patient will benefit from occupational therapy intervention in order to improve time management, financial management, stress management, job readiness skills, social skills, and health management skills in preparation to return to full time community living and to be a productive community member.    Plan:  Patient will participate in skilled occupational therapy sessions individually or in a group setting to improve coping skills, psychosocial skills, and emotional skills required to return to prior level of function. Treatment will be 4-5 times per week for 4 weeks.    Group Session:  S: "Looking forward to using the things I learn in this group to improve my life at home and with others"  O: The patient, a 26 year old female diagnosed with major depression, BPD, ADHA, Schizoaffective Dx, reported during the telehealth session struggling to perform basic self-care tasks, including getting out of bed in the morning. The occupational therapist provided education on the impact of depression on self-care and discussed ten specific strategies to improve self-care. The therapist explained how using a combination of proprioception and gravity can help the patient to move and overcome difficulties with getting out of bed in the morning. The therapist demonstrated how placing one leg off the bed while in supine can provide proprioception feedback and promote movement to further facilitate movement to the edge of the bed and ultimately sitting  up in bed. The therapist discussed the importance of gradual exposure, starting with small movements and gradually increasing the intensity of the movements, to help the patient overcome their difficulties. The therapist also discussed the use of other strategies, such as using a morning routine, breaking down tasks into smaller steps, and positive self-talk, to increase motivation and make the task of getting out of bed more manageable.  A: The patient demonstrated understanding of the OT specific strategies discussed to improve self-care, including the use of proprioception and gravity, and expressed interest in implementing them. The occupational therapist will continue to assess progress in implementing these strategies and provide additional support as needed in future telehealth sessions. The therapist will also continue to address the impact of depression on self-care and explore any additional barriers that may be hindering the patient's ability to engage in self-care activities, including  getting out of bed in the morning.  P: Continue to attend PHP OT group sessions 5x week for 4 weeks to promote daily structure, social engagement, and opportunities to develop and utilize adaptive strategies to maximize functional performance in preparation for safe transition and integration back into school, work, and the community. Plan to address topic of ADLs and Depression in next OT group session.                     OT Education - 01/04/22 2045     Education Details OT Role / ADL and Depression (1/2) during Tx    Methods Explanation;Verbal cues;Handout    Comprehension Need further instruction              OT Short Term Goals - 01/04/22 2101       OT SHORT TERM GOAL #1   Title Pt will be independent w/ psychosocial skills in order to reengage in community re-entry w/ independence upon DC from this OT PHP program.    Time 4    Period Weeks    Status New    Target Date  02/03/22      OT SHORT TERM GOAL #2   Title pt to demonstrate independence w/ goal creation and routine building skills to improve b/iADLs and community re-entry at time of discharge from this PHP OT program.    Time 4    Period Weeks    Status New    Target Date 02/03/22      OT SHORT TERM GOAL #3   Title pt to demonstrate independence in time mgmt skills to improve b/i ADLs w/ success at the time of her discharge from this OT PHP program    Time 4    Period Weeks    Status New    Target Date 02/03/22                      Plan - 01/04/22 2052     Clinical Impression Statement Pt presents via webex for initial assessment and is agreeable to evaluation and assessment process w/ appropriate affect and mood t/out session. pt responded appropriately and what appears to be accurately to OTs OCAIRS Initial Assessment and later participated in her initial OT PHP group where the topic covered ADLs and Depression. Pt looks forward to participation and appears to have stable support at home and currently has guardianship / she appears to possess a fair amount of insight into her own MH struggles and limitations.    OT Occupational Profile and History Problem Focused Assessment - Including review of records relating to presenting problem    Occupational performance deficits (Please refer to evaluation for details): IADL's;Rest and Sleep;Education;Work;Leisure;Social Participation    Psychosocial Skills Habits;Routines and Behaviors;Interpersonal Interaction;Coping Strategies    Rehab Potential Good    Clinical Decision Making Limited treatment options, no task modification necessary    Comorbidities Affecting Occupational Performance: None    Modification or Assistance to Complete Evaluation  No modification of tasks or assist necessary to complete eval    OT Frequency 5x / week    OT Duration 4 weeks    OT Treatment/Interventions Psychosocial skills training;Coping strategies  training;Patient/family education    Plan OT plans to address psychosocial deficits that directly interfer w/ pt's abiity to engage in her daily b/iADLs to a degree of dysfunction and disorder    Consulted and Agree with Plan of Care Patient  Patient will benefit from skilled therapeutic intervention in order to improve the following deficits and impairments:       Psychosocial Skills: Habits, Routines and Behaviors, Interpersonal Interaction, Coping Strategies   Visit Diagnosis: Difficulty coping  Schizoaffective disorder, bipolar type [facility])    Problem List Patient Active Problem List   Diagnosis Date Noted   Vaginal discharge 11/24/2021   Gastroenteritis 06/13/2021   Bronchitis 04/18/2021   Seizure disorder (HCC) 10/25/2020   Thrush 10/25/2020   Amenorrhea 10/06/2020   Ear pain, left 10/06/2020   Schizo-affective schizophrenia (HCC)    Allergy    ADHD (attention deficit hyperactivity disorder)    Depression    Bipolar I disorder (HCC) 05/12/2020   Benzodiazepine dependence (HCC) 05/12/2020   Morbid obesity (HCC) 09/06/2019   Possible pregnancy 09/06/2019   Delirium tremens (HCC) 05/20/2019   Seizure (HCC) 05/20/2019   Acute vaginitis 05/20/2019   High risk sexual behavior 05/20/2019   Vitamin D deficiency 03/10/2019   Constipation 04/03/2018   Schizoaffective disorder (HCC) 03/08/2018   Bipolar disorder, current episode manic severe with psychotic features (HCC)    MDD (major depressive disorder), recurrent severe, without psychosis (HCC) 02/11/2018   Pelvic pain 01/31/2018   Genital herpes simplex 10/24/2017   Anxiety 08/30/2017   Severe bipolar I disorder, current or most recent episode depressed (HCC) 03/30/2017   Syncope 03/03/2017   STD exposure 05/13/2015   Eczema 12/11/2013   Encounter for contraceptive management 03/16/2013   Bipolar disorder (HCC) 12/30/2012   ADD (attention deficit disorder) 04/24/2011    Ted Mcalpine,  OT 01/04/2022, 9:06 PM  Kerrin Champagne, OT   [facility] HOSPITALIZATION PROGRAM [address] [place], [place], [address] Phone: [phone]   Fax:  [phone]  Name: LARISA LANIUS MRN: [id number] Date of Birth: 11-Nov-1995

## 2022-01-04 NOTE — Progress Notes (Signed)
Spoke with patient via Webex video call, used 2 identifiers to correctly identify patient. States that she went to [facility] for an evaluation after she took an overdose of Propanolol on a Saturday but did not tell her mother until Monday. This is her first time in PHP. She has been staying in her home, not working but feels guilty for not having a plan for her life. Hears voices that tell her grandiose things. 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 Has passive suicidal thoughts at times because the voices start to get to her. No plan or intent, says she wants to live. Feels paranoid at times like there are cameras in her fire alarms so she speaks with a low voice. Denies visual hallucinations. Denies HI. Denies SI today. On scale 1-10 as 10 being worst she rates depression at 7 and anxiety at 3. PHQ9=12. Groups are going well for her and she is taking notes. No issues or complaints.

## 2022-01-05 ENCOUNTER — Other Ambulatory Visit (HOSPITAL_COMMUNITY): Payer: Medicare Other

## 2022-01-05 ENCOUNTER — Other Ambulatory Visit (HOSPITAL_COMMUNITY): Payer: Medicare Other | Admitting: Licensed Clinical Social Worker

## 2022-01-05 DIAGNOSIS — F250 Schizoaffective disorder, bipolar type: Secondary | ICD-10-CM | POA: Diagnosis not present

## 2022-01-05 DIAGNOSIS — R4589 Other symptoms and signs involving emotional state: Secondary | ICD-10-CM

## 2022-01-07 ENCOUNTER — Encounter (HOSPITAL_COMMUNITY): Payer: Self-pay | Admitting: Family

## 2022-01-07 NOTE — Progress Notes (Signed)
Virtual Visit via Video Note  I connected with [name] on 01/07/22 at  9:00 AM EDT by a video enabled telemedicine application and verified that I am speaking with the correct person using two identifiers.  Location: Patient: home Provider: office   I discussed the limitations of evaluation and management by telemedicine and the availability of in person appointments. The patient expressed understanding and agreed to proceed.    I discussed the assessment and treatment plan with the patient. The patient was provided an opportunity to ask questions and all were answered. The patient agreed with the plan and demonstrated an understanding of the instructions.   The patient was advised to call back or seek an in-person evaluation if the symptoms worsen or if the condition fails to improve as anticipated.  I provided 15 minutes of non-face-to-face time during this encounter.   Oneta Rack, NP    Behavioral Health Partial Program Assessment Note  Date: 01/07/2022 Name: [name] MRN: [id number]  Chief Complaint:  worsening depression  Subjective: [name] reported " I was feeling suicidal."    [id number] [name] is a 26 y.o. African American female presents with suicidal ideation with plan and reported attempt. Stated feeling depressed about her unemployment status. Reported previous inpatient admission in 2018. Denied illicit drug use or substance abuse. She agrees with information provided in the below discharge assessment.   Patient was enrolled in partial psychiatric program on 01/07/22.  Primary complaints include: depression worse, feeling depressed, and poor concentration.  Onset of symptoms was gradual with gradually worsening course since that time. Psychosocial Stressors include the following: family.    Per admission assessment note: "[name] is a 26 y.o. female with a history of schizoaffective disorder and anxiety who presents to [facility]  voluntarily as a walk-in with her mother [name]), who is her legal guardian. Patient reports that she took #6 (10 mg) propanolol on Saturday as a suicide attempt. Patient states that she did not tell her mother until this morning. Patient's mother reports that she has secured the patient's medications. Patient denies current suicidal ideations.Patient states that she feels alone at times. She states that she thinks getting out of the house for therapy and other activities would be helpful. However, she states that when she hears others talking in public she feels they may be talking about her and this prevents her from going out. Patient reports that she is followed by Dr. [name] for medication management. States that she does not have a therapist since her therapist retired. Patient's mother reports that the patient missed a dose of her Invega injection in November. She currently receives the 3 month injection and states that the plan is for the patient to start the Invega 6 month injection in June/July"  I have reviewed the following documentation dated 01/05/2022: past psychiatric history and past medical history  Complaints of Pain: nonear Past Psychiatric History:  Past psychiatric hospitalizations  and Past medication trials   Currently in treatment with .  Substance Abuse History: none Use of Alcohol: denied Use of Caffeine: denies use Use of over the counter:   No past surgical history on file.  Past Medical History:  Diagnosis Date   Acute vaginitis 05/20/2019   ADD (attention deficit disorder) 04/24/2011   ADHD (attention deficit hyperactivity disorder)    Allergy    Anxiety    Benzodiazepine dependence (HCC) 05/12/2020   Bipolar disorder (HCC)    Bipolar disorder, current episode manic severe  with psychotic features (HCC)    Bipolar I disorder (HCC) 05/12/2020   Constipation 04/03/2018   Delirium tremens (HCC) 05/20/2019   Depression    Eczema 12/11/13   Genital herpes simplex  10/24/2017   High risk heterosexual behavior 05/20/2019   MDD (major depressive disorder), recurrent severe, without psychosis (HCC) 02/11/2018   Morbid obesity (HCC) 09/06/2019   Pelvic pain 01/31/2018   Possible pregnancy 09/06/2019   Preventative health care 03/16/2013   Schizo-affective schizophrenia (HCC)    Schizoaffective disorder (HCC) 03/08/2018   Seizure (HCC) 05/20/2019   Severe bipolar I disorder, current or most recent episode depressed (HCC) 03/30/2017   STD exposure 05/13/2015   Syncope 03/03/2017   Vitamin D deficiency 03/10/2019   Outpatient Encounter Medications as of 01/05/2022  Medication Sig   azelastine (ASTELIN) 0.1 % nasal spray Place 2 sprays into both nostrils daily as needed for rhinitis or allergies as directed   buPROPion (WELLBUTRIN XL) 150 MG 24 hr tablet Take 1 tablet by mouth each morning   buPROPion (WELLBUTRIN XL) 150 MG 24 hr tablet Take 1 tablet by mouth each morning   cetirizine (ZYRTEC) 10 MG tablet Take 1/2 tablet (5 mg total) by mouth daily.   famotidine (PEPCID) 20 MG tablet Take 1 tablet (20 mg total) by mouth 2 (two) times daily. (Patient taking differently: Take 20 mg by mouth as needed.)   LORazepam (ATIVAN) 1 MG tablet TAKE 1 TABLET BY MOUTH 4 TIMES DAILY. (Patient taking differently: Take 1 mg by mouth as needed.)   norgestimate-ethinyl estradiol (VYLIBRA) 0.25-35 MG-MCG tablet Take 1 tablet by mouth daily.   norgestimate-ethinyl estradiol (VYLIBRA) 0.25-35 MG-MCG tablet Take 1 tablet by mouth daily.   Paliperidone Palmitate ER (INVEGA HAFYERA) 1560 MG/5ML SUSY Inject 1 syringe intramuscularly every 6 months (Patient not taking: Reported on 01/04/2022)   paliperidone Palmitate ER (INVEGA TRINZA) 819 MG/2.63ML SUSY injection USE AS DIRECTED EVERY 3 MONTHS   propranolol (INDERAL) 10 MG tablet Take 1/2 tablet (5 mg total) by mouth daily.   QUEtiapine (SEROQUEL) 100 MG tablet Take 1 tablet (100 mg total) by mouth at bedtime.   triamcinolone (KENALOG) 0.1 %     valACYclovir (VALTREX) 1000 MG tablet Take 1 tablet (1,000 mg total) by mouth daily. (Patient taking differently: Take 1,000 mg by mouth as needed.)   No facility-administered encounter medications on file as of 01/05/2022.   Allergies  Allergen Reactions   Concerta [Methylphenidate] Other (See Comments)    hallucinations   Terbinafine Other (See Comments)   Trazodone And Nefazodone Other (See Comments)    Pt stated she has nightmares   Clindamycin Hcl Rash   Haldol [Haloperidol] Anxiety    Unable to seat still   Terbinafine Hcl Nausea And Vomiting    Social History   Tobacco Use   Smoking status: Never   Smokeless tobacco: Never  Substance Use Topics   Alcohol use: Not Currently    Comment: occasionally   Functioning Relationships: good support system Education: Other  Family History  Problem Relation Age of Onset   Asthma Mother    Depression Father    Stroke Father    Heart disease Father    Asthma Father    Hypertension Father        pulmonary hypertension   Heart disease Maternal Uncle    Leukemia Maternal Uncle    Diabetes Maternal Grandfather    Hypertension Maternal Grandmother    COPD Maternal Grandmother    Diabetes Paternal Grandfather  Stroke Paternal Grandmother    Diabetes Paternal Grandmother      Review of Systems Constitutional: negative  Objective:  There were no vitals filed for this visit.  Physical Exam:   Mental Status Exam: Appearance:  Well groomed Psychomotor::  Within Normal Limits Attention span and concentration: Normal Behavior: calm and cooperative Speech:  normal pitch and normal volume Mood:  depressed Affect:  normal Thought Process:  Coherent Thought Content:  Logical Orientation:  person, place, and time/date Cognition:  grossly intact Insight:  Intact Judgment:  Intact Estimate of Intelligence: Average Fund of knowledge: Aware of current events Memory: Recent and remote intact Abnormal movements: None Gait  and station: Normal  Assessment:  Diagnosis: Schizoaffective disorder, bipolar type (HCC) [F25.0] 1. Schizoaffective disorder, bipolar type (HCC)   2. Difficulty coping     Indications for admission: inpatient care required if not in partial hospital program  Plan:  Orders placed for occupational therapy patient enrolled in Partial Hospitalization Program, patient's current medications are to be continued, a comprehensive treatment plan will be developed, and side effects of medications have been reviewed with patient  Collaboration of Care: Psychiatrist AEB Evelene CroonKaur  Patient/Guardian was advised Release of Information must be obtained prior to any record release in order to collaborate their care with an outside provider. Patient/Guardian was advised if they have not already done so to contact the registration department to sign all necessary forms in order for us to release information regarding their care.   Consent: Patient/Guardian gives verbal consent for treatment and assignment of benefits for services provided during this visit. Patient/Guardian expressed understanding and agreed to proceed.   Treatment options and alternatives reviewed with patient and patient understands the above plan.  Comments: Treatment plan was reviewed and agreed upon by NP T.Melvyn NethLewis and patient [name] need for group services    Oneta Rackanika N Chau Savell, NP

## 2022-01-08 ENCOUNTER — Ambulatory Visit (HOSPITAL_COMMUNITY): Payer: Medicare Other

## 2022-01-08 ENCOUNTER — Other Ambulatory Visit (HOSPITAL_COMMUNITY): Payer: Medicare Other

## 2022-01-09 ENCOUNTER — Other Ambulatory Visit (HOSPITAL_COMMUNITY): Payer: Medicare Other

## 2022-01-09 ENCOUNTER — Encounter: Payer: Self-pay | Admitting: Family Medicine

## 2022-01-09 ENCOUNTER — Encounter (HOSPITAL_COMMUNITY): Payer: Self-pay

## 2022-01-09 ENCOUNTER — Other Ambulatory Visit (HOSPITAL_COMMUNITY)
Admission: RE | Admit: 2022-01-09 | Discharge: 2022-01-09 | Disposition: A | Discharge status: Home / Self Care | Payer: No Typology Code available for payment source | Source: Ambulatory Visit | Attending: Family Medicine | Admitting: Family Medicine

## 2022-01-09 ENCOUNTER — Other Ambulatory Visit (HOSPITAL_BASED_OUTPATIENT_CLINIC_OR_DEPARTMENT_OTHER): Payer: Self-pay

## 2022-01-09 ENCOUNTER — Encounter (HOSPITAL_COMMUNITY): Payer: Self-pay | Admitting: Family

## 2022-01-09 ENCOUNTER — Ambulatory Visit (INDEPENDENT_AMBULATORY_CARE_PROVIDER_SITE_OTHER): Payer: No Typology Code available for payment source | Admitting: Family Medicine

## 2022-01-09 ENCOUNTER — Other Ambulatory Visit (HOSPITAL_COMMUNITY): Payer: Medicare Other | Admitting: Licensed Clinical Social Worker

## 2022-01-09 VITALS — BP 110/60 | HR 115 | Temp 98.3°F | Resp 18 | Ht 64.0 in | Wt 226.8 lb

## 2022-01-09 DIAGNOSIS — R8290 Unspecified abnormal findings in urine: Secondary | ICD-10-CM

## 2022-01-09 DIAGNOSIS — F250 Schizoaffective disorder, bipolar type: Secondary | ICD-10-CM

## 2022-01-09 DIAGNOSIS — N760 Acute vaginitis: Secondary | ICD-10-CM

## 2022-01-09 DIAGNOSIS — N898 Other specified noninflammatory disorders of vagina: Secondary | ICD-10-CM | POA: Diagnosis not present

## 2022-01-09 DIAGNOSIS — R4589 Other symptoms and signs involving emotional state: Secondary | ICD-10-CM

## 2022-01-09 LAB — POC URINALSYSI DIPSTICK (AUTOMATED)
Bilirubin, UA: NEGATIVE
Blood, UA: NEGATIVE
Glucose, UA: NEGATIVE
Ketones, UA: NEGATIVE
Leukocytes, UA: NEGATIVE
Nitrite, UA: NEGATIVE
Protein, UA: NEGATIVE
Spec Grav, UA: 1.025 (ref 1.010–1.025)
Urobilinogen, UA: 0.2 E.U./dL
pH, UA: 6 (ref 5.0–8.0)

## 2022-01-09 MED ORDER — FLUCONAZOLE 150 MG PO TABS
ORAL_TABLET | ORAL | 0 refills | Status: DC
  Filled 2022-01-09: qty 2, 3d supply, fill #0

## 2022-01-09 NOTE — Progress Notes (Signed)
Virtual Visit via Video Note  I connected with [name] on 01/09/22 at  9:00 AM EDT by a video enabled telemedicine application and verified that I am speaking with the correct person using two identifiers.  Location: Patient: Home Provider: Office   I discussed the limitations of evaluation and management by telemedicine and the availability of in person appointments. The patient expressed understanding and agreed to proceed.     I discussed the assessment and treatment plan with the patient. The patient was provided an opportunity to ask questions and all were answered. The patient agreed with the plan and demonstrated an understanding of the instructions.   The patient was advised to call back or seek an in-person evaluation if the symptoms worsen or if the condition fails to improve as anticipated.  I provided 15 minutes of non-face-to-face time during this encounter.   Jaclyn Rackanika N Jeriko Kowalke, NP   [facility] OP Progress Note  01/09/2022 10:56 AM [name]  MRN:  [id number]  Chief Complaint: [name] reported " I am doing okay, I always hear voice but it's getting better."   HPI: [name] was seen and evaluated via WebEx.  She is denying suicidal or homicidal ideations.  Denies auditory visual hallucinations.  Reports she fell asleep during group due to feeling sedated by Ativan.  However states this medication helps her anxiety and mood.    She rates her depression 5 out of 10 with 10 being the worst.  States her anxiety was at a 8 and is currently at a 6 or 7 with medication assistance.  She reports uneventful weekend.  States she watched lifetime movies with her family.  Reports a good appetite.  States she is resting well throughout the night.  Support, encouragement and  reassurance was provided.    Visit Diagnosis:    ICD-10-CM   1. Schizoaffective disorder, bipolar type (HCC)  F25.0     2. Difficulty coping  R45.89       Past Psychiatric History:    Past Medical History:  Past Medical History:  Diagnosis Date   Acute vaginitis 05/20/2019   ADD (attention deficit disorder) 04/24/2011   ADHD (attention deficit hyperactivity disorder)    Allergy    Anxiety    Benzodiazepine dependence (HCC) 05/12/2020   Bipolar disorder (HCC)    Bipolar disorder, current episode manic severe with psychotic features (HCC)    Bipolar I disorder (HCC) 05/12/2020   Constipation 04/03/2018   Delirium tremens (HCC) 05/20/2019   Depression    Eczema 12/11/13   Genital herpes simplex 10/24/2017   High risk heterosexual behavior 05/20/2019   MDD (major depressive disorder), recurrent severe, without psychosis (HCC) 02/11/2018   Morbid obesity (HCC) 09/06/2019   Pelvic pain 01/31/2018   Possible pregnancy 09/06/2019   Preventative health care 03/16/2013   Schizo-affective schizophrenia (HCC)    Schizoaffective disorder (HCC) 03/08/2018   Seizure (HCC) 05/20/2019   Severe bipolar I disorder, current or most recent episode depressed (HCC) 03/30/2017   STD exposure 05/13/2015   Syncope 03/03/2017   Vitamin D deficiency 03/10/2019   No past surgical history on file.  Family Psychiatric History:   Family History:  Family History  Problem Relation Age of Onset   Asthma Mother    Depression Father    Stroke Father    Heart disease Father    Asthma Father    Hypertension Father        pulmonary hypertension   Heart disease Maternal Uncle  Leukemia Maternal Uncle    Diabetes Maternal Grandfather    Hypertension Maternal Grandmother    COPD Maternal Grandmother    Diabetes Paternal Grandfather    Stroke Paternal Grandmother    Diabetes Paternal Grandmother     Social History:  Social History   Socioeconomic History   Marital status: Single    Spouse name: Not on file   Number of children: 0   Years of education: Not on file   Highest education level: Associate degree: academic program  Occupational History   Occupation: Unemployed  Tobacco Use    Smoking status: Never   Smokeless tobacco: Never  Vaping Use   Vaping Use: Former   Quit date: 09/01/2017  Substance and Sexual Activity   Alcohol use: Not Currently    Comment: occasionally   Drug use: Not Currently   Sexual activity: Not Currently    Birth control/protection: Pill  Other Topics Concern   Not on file  Social History Narrative   Lives with mom in an apartment on the second floor.  No children.  Currently not working.  Education: college. Left handed   Social Determinants of Health   Financial Resource Strain: Not on file  Food Insecurity: Not on file  Transportation Needs: Not on file  Physical Activity: Not on file  Stress: Not on file  Social Connections: Not on file    Allergies:  Allergies  Allergen Reactions   Concerta [Methylphenidate] Other (See Comments)    hallucinations   Terbinafine Other (See Comments)   Trazodone And Nefazodone Other (See Comments)    Pt stated she has nightmares   Clindamycin Hcl Rash   Haldol [Haloperidol] Anxiety    Unable to seat still   Terbinafine Hcl Nausea And Vomiting    Metabolic Disorder Labs: Lab Results  Component Value Date   HGBA1C 5.0 07/02/2020   MPG 96.8 07/02/2020   MPG 102.54 05/12/2020   Lab Results  Component Value Date   PROLACTIN 309.0 (H) 07/02/2020   Lab Results  Component Value Date   CHOL 196 10/06/2020   TRIG 100.0 10/06/2020   HDL 54.30 10/06/2020   CHOLHDL 4 10/06/2020   VLDL 20.0 10/06/2020   LDLCALC 121 (H) 10/06/2020   LDLCALC 118 (H) 07/02/2020   Lab Results  Component Value Date   TSH 1.33 10/06/2020   TSH 1.913 07/02/2020    Therapeutic Level Labs: No results found for: LITHIUM Lab Results  Component Value Date   VALPROATE 21 (L) 07/02/2020   VALPROATE 62 05/21/2020   No components found for:  CBMZ  Current Medications: Current Outpatient Medications  Medication Sig Dispense Refill   azelastine (ASTELIN) 0.1 % nasal spray Place 2 sprays into both nostrils  daily as needed for rhinitis or allergies as directed 30 mL 12   buPROPion (WELLBUTRIN XL) 150 MG 24 hr tablet Take 1 tablet by mouth each morning 90 tablet 0   buPROPion (WELLBUTRIN XL) 150 MG 24 hr tablet Take 1 tablet by mouth each morning 90 tablet 0   cetirizine (ZYRTEC) 10 MG tablet Take 1/2 tablet (5 mg total) by mouth daily. 100 tablet 1   famotidine (PEPCID) 20 MG tablet Take 1 tablet (20 mg total) by mouth 2 (two) times daily. (Patient taking differently: Take 20 mg by mouth as needed.) 60 tablet 2   LORazepam (ATIVAN) 1 MG tablet TAKE 1 TABLET BY MOUTH 4 TIMES DAILY. (Patient taking differently: Take 1 mg by mouth as needed.) 360 tablet 0  norgestimate-ethinyl estradiol (VYLIBRA) 0.25-35 MG-MCG tablet Take 1 tablet by mouth daily. 28 tablet 11   norgestimate-ethinyl estradiol (VYLIBRA) 0.25-35 MG-MCG tablet Take 1 tablet by mouth daily. 28 tablet 11   Paliperidone Palmitate ER (INVEGA HAFYERA) 1560 MG/5ML SUSY Inject 1 syringe intramuscularly every 6 months (Patient not taking: Reported on 01/04/2022) 5 mL 1   paliperidone Palmitate ER (INVEGA TRINZA) 819 MG/2.63ML SUSY injection USE AS DIRECTED EVERY 3 MONTHS 2.63 mL 0   propranolol (INDERAL) 10 MG tablet Take 1/2 tablet (5 mg total) by mouth daily. 45 tablet 3   QUEtiapine (SEROQUEL) 100 MG tablet Take 1 tablet (100 mg total) by mouth at bedtime. 30 tablet 0   triamcinolone (KENALOG) 0.1 %      valACYclovir (VALTREX) 1000 MG tablet Take 1 tablet (1,000 mg total) by mouth daily. (Patient taking differently: Take 1,000 mg by mouth as needed.) 30 tablet 5   No current facility-administered medications for this visit.     Musculoskeletal: Strength & Muscle Tone: within normal limits Gait & Station: normal Patient leans: N/A  Psychiatric Specialty Exam: Review of Systems  HENT: Negative.    Eyes: Negative.   Respiratory: Negative.    Psychiatric/Behavioral:  Positive for hallucinations. Negative for suicidal ideas. The patient is  nervous/anxious.   All other systems reviewed and are negative.  There were no vitals taken for this visit.There is no height or weight on file to calculate BMI.  General Appearance: Casual  Eye Contact:  Good  Speech:  Clear and Coherent  Volume:  Normal  Mood:  Anxious and Depressed  Affect:  Congruent  Thought Process:  Coherent  Orientation:  Full (Time, Place, and Person)  Thought Content: Logical   Suicidal Thoughts:  No  Homicidal Thoughts:  No  Memory:  Immediate;   Good Recent;   Good  Judgement:  Good  Insight:  Good  Psychomotor Activity:  Normal  Concentration:  Concentration: Good  Recall:  Good  Fund of Knowledge: Good  Language: Good  Akathisia:  No  Handed:  Right  AIMS (if indicated): done  Assets:  Communication Skills Desire for Improvement Resilience Social Support  ADL's:  Intact  Cognition: WNL  Sleep:  Good   Screenings: AIMS    Flowsheet Row [facility] (Dis[facility]) from OP [facility]Dis[facility]) [facility]  AIMS Total Score 0 0 0 0      AUDIT    Flowsheet Row [facility]) [facility] Ad[facility] (Discharged) from OP Visit [facility] ([facility]  Alcohol Use Disorder Identification Test Final Score (AUDIT) 0 Mini-Mental    Flowsheet Row Office Visit from 04/18/2018 in [facility]  Total Score (max 30 points ) 28      PHQ2-9    Flowsheet Row Counselor  [facility] Counselor from 12/28/2021 [facility] Office Visit from 08/03/2021 in [facility] Office Visit from 09/30/2018 in [facility] Office Visit  from 11/02/2016 in [facility]  PHQ-2 Total Score 2 3 0 0 0  PHQ-9 Total Score 12 16 -- -- --      Advertising copywriter from 01/04/2022 [facility] Counselor from 12/28/2021 [facility] ED from 06/26/2021 in [facility] EMERGENCY DEPARTMENT  C-SSRS RISK CATEGORY High Risk Error: Q3, 4, or 5 should not be populated when Q2 is No Error: Question 6 not populated        Assessment and Plan:   Continue partial hospitalization programming Continue medications as indicated  Collaboration of Care: Collaboration of Care: Psychiatrist AEB Rupinder Recruitment consultant for medication management  Patient/Guardian was advised Release of Information must be obtained prior to any record release in order to collaborate their care with an outside provider. Patient/Guardian was advised if they have not already done so to contact the registration department to sign all necessary forms in order for [name] to release information regarding their care.   Consent: Patient/Guardian gives verbal consent for treatment and assignment of benefits for services provided during this visit. Patient/Guardian expressed understanding and agreed to proceed.    Jaclyn Rack, NP 01/09/2022, 10:56 AM

## 2022-01-09 NOTE — Progress Notes (Signed)
Spoke with patient via Webex video call, used 2 identifiers to correctly identify patient. States that groups are going good. Has more energy and feels good. Continues to hears voices when she lays down or things get quiet but she expects to always hear them. They are annoying but nothing negative or commanding. On scale 1-10 as 10 being worst she rates depression at 1 and anxiety at 2. Has been taking notes in groups and feels it is helping her. Denies SI/HI or AV hallucinations. No issues or complaints.

## 2022-01-09 NOTE — Assessment & Plan Note (Signed)
Self swab done  ua normal  Diflucan sent in due to pt already taking flagyl  F/u prn

## 2022-01-09 NOTE — Therapy (Signed)
[facility] PARTIAL HOSPITALIZATION PROGRAM [address] [place], [place], [address] Phone: [phone]   Fax:  [phone]  Occupational Therapy Treatment Virtual Visit via Video Note  I connected with [name] on 01/09/22 at  8:00 AM EDT by a video enabled telemedicine application and verified that I am speaking with the correct person using two identifiers.  Location: Patient: home Provider: office   I discussed the limitations of evaluation and management by telemedicine and the availability of in person appointments. The patient expressed understanding and agreed to proceed.    The patient was advised to call back or seek an in-person evaluation if the symptoms worsen or if the condition fails to improve as anticipated.  I provided 60 minutes of non-face-to-face time during this encounter.   Patient Details  Name: [name] MRN: [id number] Date of Birth: Dec 02, 1995 No data recorded  Encounter Date: 01/09/2022   OT End of Session - 01/09/22 1225     Visit Number 2    Number of Visits 20    Date for OT Re-Evaluation 02/03/22    Authorization Type MEDICARE PART A AND B    Authorization Time Period working on OT benefits / will update as able    OT Start Time 1100    OT Stop Time 1200    OT Time Calculation (min) 60 min             Past Medical History:  Diagnosis Date   Acute vaginitis 05/20/2019   ADD (attention deficit disorder) 04/24/2011   ADHD (attention deficit hyperactivity disorder)    Allergy    Anxiety    Benzodiazepine dependence (HCC) 05/12/2020   Bipolar disorder (HCC)    Bipolar disorder, current episode manic severe with psychotic features (HCC)    Bipolar I disorder (HCC) 05/12/2020   Constipation 04/03/2018   Delirium tremens (HCC) 05/20/2019   Depression    Eczema 12/11/13   Genital herpes simplex 10/24/2017   High risk heterosexual behavior 05/20/2019   MDD (major depressive disorder), recurrent  severe, without psychosis (HCC) 02/11/2018   Morbid obesity (HCC) 09/06/2019   Pelvic pain 01/31/2018   Possible pregnancy 09/06/2019   Preventative health care 03/16/2013   Schizo-affective schizophrenia (HCC)    Schizoaffective disorder (HCC) 03/08/2018   Seizure (HCC) 05/20/2019   Severe bipolar I disorder, current or most recent episode depressed (HCC) 03/30/2017   STD exposure 05/13/2015   Syncope 03/03/2017   Vitamin D deficiency 03/10/2019    History reviewed. No pertinent surgical history.  There were no vitals filed for this visit.   Subjective Assessment - 01/09/22 1225     Currently in Pain? No/denies    Pain Score 0-No pain    Multiple Pain Sites No               Group Session:  S: "Doing better, trying to apply a few of the new strategies I've learned here"  O: : The patient, a 26 year old female reported during the telehealth session struggling to perform basic self-care tasks, including bathing, dressing, and taking medication. The occupational therapist provided education on the impact of depression on self-care and discussed specific strategies to improve self-care. The therapist explained how using a shower chair or bath bench could help the patient conserve energy during showering, and how to obtain and use such equipment.   The therapist also provided a sample bedtime routine, including taking a warm bath, reading a book, or practicing relaxation techniques,  and discussed how this routine could improve the patient's overall sleep hygiene. Additionally, the therapist demonstrated how to use a pill organizer and discussed the importance of wearing comfortable clothing.   The OT recommend pt participate in a Self Care Contract to better help her prioritize her [place] areas she most wants to address in the following weeks.The therapist also discussed breaking down tasks into smaller steps to make them more manageable and increase motivation, and provided examples of how to do  so. The therapist provided education on using positive self-talk to encourage the patient's ability to engage in self-care activities, and recommended listening to calming or uplifting music and using scented products such as candles or essential oils to promote relaxation.   Finally, the therapist provided education on mindfulness techniques such as deep breathing, meditation, or yoga, and discussed the importance of establishing social support, including exploring options for support groups or counseling.  A: The patient demonstrated an overall understanding of the strategies discussed to improve self-care and expressed interest in implementing them. The occupational therapist will continue to assess progress in implementing these strategies and provide additional support as needed in future telehealth sessions. The therapist will also continue to address the impact of depression on self-care and explore any additional barriers that may be hindering the patient's ability to engage in self-care activities.  P: Continue to attend PHP OT group sessions 5x week for 4 weeks to promote daily structure, social engagement, and opportunities to develop and utilize adaptive strategies to maximize functional performance in preparation for safe transition and integration back into school, work, and the community. Plan to address topic of tbd in next OT group session.                    OT Education - 01/09/22 1225     Education Details ADL and Depression 2/2    Person(s) Educated Patient    Methods Explanation;Verbal cues;Handout    Comprehension Need further instruction              OT Short Term Goals - 01/04/22 2101       OT SHORT TERM GOAL #1   Title Pt will be independent w/ psychosocial skills in order to reengage in community re-entry w/ independence upon DC from this OT PHP program.    Time 4    Period Weeks    Status New    Target Date 02/03/22      OT SHORT TERM GOAL  #2   Title pt to demonstrate independence w/ goal creation and routine building skills to improve b/iADLs and community re-entry at time of discharge from this PHP OT program.    Time 4    Period Weeks    Status New    Target Date 02/03/22      OT SHORT TERM GOAL #3   Title pt to demonstrate independence in time mgmt skills to improve b/i ADLs w/ success at the time of her discharge from this OT PHP program    Time 4    Period Weeks    Status New    Target Date 02/03/22                      Plan - 01/09/22 1226     Psychosocial Skills Habits;Routines and Behaviors;Interpersonal Interaction;Coping Strategies             Patient will benefit from skilled therapeutic intervention in order to improve the following deficits  and impairments:       Psychosocial Skills: Habits, Routines and Behaviors, Interpersonal Interaction, Coping Strategies   Visit Diagnosis: Difficulty coping    Problem List Patient Active Problem List   Diagnosis Date Noted   Vaginal discharge 11/24/2021   Gastroenteritis 06/13/2021   Bronchitis 04/18/2021   Seizure disorder (HCC) 10/25/2020   Thrush 10/25/2020   Amenorrhea 10/06/2020   Ear pain, left 10/06/2020   Schizo-affective schizophrenia (HCC)    Allergy    ADHD (attention deficit hyperactivity disorder)    Depression    Bipolar I disorder (HCC) 05/12/2020   Benzodiazepine dependence (HCC) 05/12/2020   Morbid obesity (HCC) 09/06/2019   Possible pregnancy 09/06/2019   Delirium tremens (HCC) 05/20/2019   Seizure (HCC) 05/20/2019   Acute vaginitis 05/20/2019   High risk sexual behavior 05/20/2019   Vitamin D deficiency 03/10/2019   Constipation 04/03/2018   Schizoaffective disorder (HCC) 03/08/2018   Bipolar disorder, current episode manic severe with psychotic features (HCC)    MDD (major depressive disorder), recurrent severe, without psychosis (HCC) 02/11/2018   Pelvic pain 01/31/2018   Genital herpes simplex  10/24/2017   Anxiety 08/30/2017   Severe bipolar I disorder, current or most recent episode depressed (HCC) 03/30/2017   Syncope 03/03/2017   STD exposure 05/13/2015   Eczema 12/11/2013   Encounter for contraceptive management 03/16/2013   Bipolar disorder (HCC) 12/30/2012   ADD (attention deficit disorder) 04/24/2011    Ted Mcalpine, OT 01/09/2022, 12:26 PM  Kerrin Champagne, OT   [facility] HOSPITALIZATION PROGRAM [address] [place], [place], [address] Phone: [phone]   Fax:  [phone]  Name: [name] MRN: [id number] Date of Birth: [birth year]

## 2022-01-09 NOTE — Progress Notes (Signed)
Subjective:   By signing my name below, I, [name], attest that this documentation has been prepared under the direction and in the presence of Ann Held, DO. 01/09/2022    Patient ID: [name], female    DOB: Nov 10, 1995, 26 y.o.   MRN: [id number]  Chief Complaint  Patient presents with   Vaginal Discharge    Pt states sxs started last Sunday, almost week, Pt states vaginal odor and strong urine smell.    Vaginal Discharge The patient's primary symptoms include vaginal discharge. Pertinent negatives include no back pain, fever, headaches, rash or vomiting.  Patient is in today for a office visit.   She complains of yellow vaginal discharge with a strong odor for the past 2 weeks. She reports her vaginal discharge started out a thick but later became yellow and had a odor. She denies having any pain. She is interested in taking diflucan to manage her symptoms. She has tried metronidazole to manage her symptoms but found no improvement in her symptoms.    Past Medical History:  Diagnosis Date   Acute vaginitis 05/20/2019   ADD (attention deficit disorder) 04/24/2011   ADHD (attention deficit hyperactivity disorder)    Allergy    Anxiety    Benzodiazepine dependence ([place]) 05/12/2020   Bipolar disorder ([place])    Bipolar disorder, current episode manic severe with psychotic features ([place])    Bipolar I disorder ([place]) 05/12/2020   Constipation 04/03/2018   Delirium tremens ([place]) 05/20/2019   Depression    Eczema 12/11/13   Genital herpes simplex 10/24/2017   High risk heterosexual behavior 05/20/2019   MDD (major depressive disorder), recurrent severe, without psychosis ([place]) 02/11/2018   Morbid obesity ([place]) 09/06/2019   Pelvic pain 01/31/2018   Possible pregnancy 09/06/2019   Preventative health care 03/16/2013   Schizo-affective schizophrenia ([place])    Schizoaffective disorder ([place]) 03/08/2018   Seizure ([place]) 05/20/2019   Severe bipolar I disorder, current or most  recent episode depressed ([place]) 03/30/2017   STD exposure 05/13/2015   Syncope 03/03/2017   Vitamin D deficiency 03/10/2019    No past surgical history on file.  Family History  Problem Relation Age of Onset   Asthma Mother    Depression Father    Stroke Father    Heart disease Father    Asthma Father    Hypertension Father        pulmonary hypertension   Heart disease Maternal Uncle    Leukemia Maternal Uncle    Diabetes Maternal Grandfather    Hypertension Maternal Grandmother    COPD Maternal Grandmother    Diabetes Paternal Grandfather    Stroke Paternal Grandmother    Diabetes Paternal Grandmother     Social History   Socioeconomic History   Marital status: Single    Spouse name: Not on file   Number of children: 0   Years of education: Not on file   Highest education level: Associate degree: academic program  Occupational History   Occupation: Unemployed  Tobacco Use   Smoking status: Never   Smokeless tobacco: Never  Vaping Use   Vaping Use: Former   Quit date: 09/01/2017  Substance and Sexual Activity   Alcohol use: Not Currently    Comment: occasionally   Drug use: Not Currently   Sexual activity: Not Currently    Birth control/protection: Pill  Other Topics Concern   Not on file  Social History Narrative   Lives with mom in an apartment on  the second floor.  No children.  Currently not working.  Education: college. Left handed   Social Determinants of Health   Financial Resource Strain: Not on file  Food Insecurity: Not on file  Transportation Needs: Not on file  Physical Activity: Not on file  Stress: Not on file  Social Connections: Not on file  Intimate Partner Violence: Not on file    Outpatient Medications Prior to Visit  Medication Sig Dispense Refill   azelastine (ASTELIN) 0.1 % nasal spray Place 2 sprays into both nostrils daily as needed for rhinitis or allergies as directed 30 mL 12   buPROPion (WELLBUTRIN XL) 150 MG 24 hr tablet Take  1 tablet by mouth each morning 90 tablet 0   buPROPion (WELLBUTRIN XL) 150 MG 24 hr tablet Take 1 tablet by mouth each morning 90 tablet 0   cetirizine (ZYRTEC) 10 MG tablet Take 1/2 tablet (5 mg total) by mouth daily. 100 tablet 1   famotidine (PEPCID) 20 MG tablet Take 1 tablet (20 mg total) by mouth 2 (two) times daily. (Patient taking differently: Take 20 mg by mouth as needed.) 60 tablet 2   LORazepam (ATIVAN) 1 MG tablet TAKE 1 TABLET BY MOUTH 4 TIMES DAILY. (Patient taking differently: Take 1 mg by mouth as needed.) 360 tablet 0   Paliperidone Palmitate ER (INVEGA HAFYERA) 1560 MG/5ML SUSY Inject 1 syringe intramuscularly every 6 months 5 mL 1   paliperidone Palmitate ER (INVEGA TRINZA) 819 MG/2.63ML SUSY injection USE AS DIRECTED EVERY 3 MONTHS 2.63 mL 0   propranolol (INDERAL) 10 MG tablet Take 1/2 tablet (5 mg total) by mouth daily. 45 tablet 3   QUEtiapine (SEROQUEL) 100 MG tablet Take 1 tablet (100 mg total) by mouth at bedtime. 30 tablet 0   triamcinolone (KENALOG) 0.1 %      valACYclovir (VALTREX) 1000 MG tablet Take 1 tablet (1,000 mg total) by mouth daily. (Patient taking differently: Take 1,000 mg by mouth as needed.) 30 tablet 5   norgestimate-ethinyl estradiol (VYLIBRA) 0.25-35 MG-MCG tablet Take 1 tablet by mouth daily. (Patient not taking: Reported on 01/09/2022) 28 tablet 11   norgestimate-ethinyl estradiol (VYLIBRA) 0.25-35 MG-MCG tablet Take 1 tablet by mouth daily. (Patient not taking: Reported on 01/09/2022) 28 tablet 11   No facility-administered medications prior to visit.    Allergies  Allergen Reactions   Concerta [Methylphenidate] Other (See Comments)    hallucinations   Terbinafine Other (See Comments)   Trazodone And Nefazodone Other (See Comments)    Pt stated she has nightmares   Clindamycin Hcl Rash   Haldol [Haloperidol] Anxiety    Unable to seat still   Terbinafine Hcl Nausea And Vomiting    Review of Systems  Constitutional:  Negative for fever and  malaise/fatigue.  HENT:  Negative for congestion.   Eyes:  Negative for blurred vision.  Respiratory:  Negative for cough and shortness of breath.   Cardiovascular:  Negative for chest pain, palpitations and leg swelling.  Gastrointestinal:  Negative for vomiting.  Genitourinary:  Positive for vaginal discharge.       (+)yellow vaginal discharge with strong odor  Musculoskeletal:  Negative for back pain.  Skin:  Negative for rash.  Neurological:  Negative for loss of consciousness and headaches.      Objective:    Physical Exam Vitals and nursing note reviewed.  Constitutional:      General: She is not in acute distress.    Appearance: Normal appearance. She is not ill-appearing.  HENT:  Head: Normocephalic and atraumatic.     Right Ear: External ear normal.     Left Ear: External ear normal.  Eyes:     Extraocular Movements: Extraocular movements intact.     Pupils: Pupils are equal, round, and reactive to light.  Skin:    General: Skin is warm and dry.  Neurological:     Mental Status: She is alert and oriented to person, place, and time.  Psychiatric:        Judgment: Judgment normal.    BP 110/60 (BP Location: Left Arm, Patient Position: Sitting, Cuff Size: Large)   Pulse (!) 115   Temp 98.3 F (36.8 C) (Oral)   Resp 18   Ht 5\' 4"  (1.626 m)   Wt 226 lb 12.8 oz (102.9 kg)   SpO2 98%   BMI 38.93 kg/m  Wt Readings from Last 3 Encounters:  01/09/22 226 lb 12.8 oz (102.9 kg)  12/15/21 221 lb 9.6 oz (100.5 kg)  11/24/21 225 lb (102.1 kg)    Diabetic Foot Exam - Simple   No data filed    Lab Results  Component Value Date   WBC 6.0 11/16/2021   HGB 12.8 11/16/2021   HCT 38.6 11/16/2021   PLT 325 11/16/2021   GLUCOSE 92 11/16/2021   CHOL 196 10/06/2020   TRIG 100.0 10/06/2020   HDL 54.30 10/06/2020   LDLCALC 121 (H) 10/06/2020   ALT 10 11/16/2021   AST 11 11/16/2021   NA 140 11/16/2021   K 4.4 11/16/2021   CL 108 11/16/2021   CREATININE 0.65  11/16/2021   BUN 11 11/16/2021   CO2 23 11/16/2021   TSH 1.33 10/06/2020   HGBA1C 5.0 07/02/2020   MICROALBUR 0.6 08/02/2014    Lab Results  Component Value Date   TSH 1.33 10/06/2020   Lab Results  Component Value Date   WBC 6.0 11/16/2021   HGB 12.8 11/16/2021   HCT 38.6 11/16/2021   MCV 89.8 11/16/2021   PLT 325 11/16/2021   Lab Results  Component Value Date   NA 140 11/16/2021   K 4.4 11/16/2021   CO2 23 11/16/2021   GLUCOSE 92 11/16/2021   BUN 11 11/16/2021   CREATININE 0.65 11/16/2021   BILITOT 0.4 11/16/2021   ALKPHOS 75 10/06/2020   AST 11 11/16/2021   ALT 10 11/16/2021   PROT 6.8 11/16/2021   ALBUMIN 4.2 10/06/2020   CALCIUM 9.3 11/16/2021   ANIONGAP 12 07/02/2020   GFR 104.64 10/06/2020   Lab Results  Component Value Date   CHOL 196 10/06/2020   Lab Results  Component Value Date   HDL 54.30 10/06/2020   Lab Results  Component Value Date   LDLCALC 121 (H) 10/06/2020   Lab Results  Component Value Date   TRIG 100.0 10/06/2020   Lab Results  Component Value Date   CHOLHDL 4 10/06/2020   Lab Results  Component Value Date   HGBA1C 5.0 07/02/2020       Assessment & Plan:   Problem List Items Addressed This Visit       Unprioritized   Vaginal discharge - Primary   Relevant Medications   fluconazole (DIFLUCAN) 150 MG tablet   Other Relevant Orders   Cervicovaginal ancillary only( [place])   Acute vaginitis    Self swab done  ua normal  Diflucan sent in due to pt already taking flagyl  F/u prn        Other Visit Diagnoses     Abnormal  urine odor       Relevant Orders   POCT Urinalysis Dipstick (Automated) (Completed)        Meds ordered this encounter  Medications   fluconazole (DIFLUCAN) 150 MG tablet    Sig: Take 1 tablet by mouth for 1 dose, may repeat in 3 days if needed    Dispense:  2 tablet    Refill:  0    I, Ann Held, DO, personally preformed the services described in this documentation.   All medical record entries made by the scribe were at my direction and in my presence.  I have reviewed the chart and discharge instructions (if applicable) and agree that the record reflects my personal performance and is accurate and complete. 01/09/2022   I,[name],acting as a scribe for Ann Held, DO.,have documented all relevant documentation on the behalf of Ann Held, DO,as directed by  Ann Held, DO while in the presence of Ann Held, DO.   Ann Held, DO

## 2022-01-09 NOTE — Patient Instructions (Signed)

## 2022-01-10 ENCOUNTER — Other Ambulatory Visit (HOSPITAL_COMMUNITY): Payer: Medicare Other | Admitting: Licensed Clinical Social Worker

## 2022-01-10 ENCOUNTER — Other Ambulatory Visit (HOSPITAL_COMMUNITY): Payer: Medicare Other

## 2022-01-10 ENCOUNTER — Encounter (HOSPITAL_COMMUNITY): Payer: Self-pay

## 2022-01-10 DIAGNOSIS — F250 Schizoaffective disorder, bipolar type: Secondary | ICD-10-CM

## 2022-01-10 DIAGNOSIS — R4589 Other symptoms and signs involving emotional state: Secondary | ICD-10-CM

## 2022-01-10 NOTE — Therapy (Signed)
[facility] [address] [place], [place], [address] Phone: [phone]   Fax:  [phone]  Occupational Therapy Treatment Virtual Visit via Video Note  I connected with [name] on 01/10/22 at  8:00 AM EDT by a video enabled telemedicine application and verified that I am speaking with the correct person using two identifiers.  Location: Patient: home Provider: office   I discussed the limitations of evaluation and management by telemedicine and the availability of in person appointments. The patient expressed understanding and agreed to proceed.    The patient was advised to call back or seek an in-person evaluation if the symptoms worsen or if the condition fails to improve as anticipated.  I provided 50 minutes of non-face-to-face time during this encounter.   Patient Details  Name: [name] MRN: [id number] Date of Birth: November 12, 1995 No data recorded  Encounter Date: 01/10/2022   OT End of Session - 01/10/22 1356     Visit Number 3    Number of Visits 20    Date for OT Re-Evaluation 02/03/22    Authorization Type MEDICARE PART A AND B    Authorization Time Period working on OT benefits / will update as able    OT Start Time 1200    OT Stop Time 1250    OT Time Calculation (min) 50 min             Past Medical History:  Diagnosis Date   Acute vaginitis 05/20/2019   ADD (attention deficit disorder) 04/24/2011   ADHD (attention deficit hyperactivity disorder)    Allergy    Anxiety    Benzodiazepine dependence ([facility]) 05/12/2020   Bipolar disorder ([facility])    Bipolar disorder, current episode manic severe with psychotic features ([facility])    Bipolar I disorder ([facility]) 05/12/2020   Constipation 04/03/2018   Delirium tremens ([facility]) 05/20/2019   Depression    Eczema 12/11/13   Genital herpes simplex 10/24/2017   High risk heterosexual behavior 05/20/2019   MDD (major depressive disorder), recurrent  severe, without psychosis ([facility]) 02/11/2018   Morbid obesity ([facility]) 09/06/2019   Pelvic pain 01/31/2018   Possible pregnancy 09/06/2019   Preventative health care 03/16/2013   Schizo-affective schizophrenia ([facility])    Schizoaffective disorder ([facility]) 03/08/2018   Seizure ([facility]CC) 05/20/2019   Severe bipolar I disorder, current or most recent episode depressed ([facility]) 03/30/2017   STD exposure 05/13/2015   Syncope 03/03/2017   Vitamin D deficiency 03/10/2019    History reviewed. No pertinent surgical history.  There were no vitals filed for this visit.   Subjective Assessment - 01/10/22 1356     Currently in Pain? No/denies    Pain Score 0-No pain    Multiple Pain Sites No              Group Session:  S: I can think of a time when I was treated unfairly and I can also think of a time where I showed someone empathy and the feelings I remember from both.   O: The therapy session was led by an occupational therapist via telehealth with eight patients in attendance. The focus of the session was on exploring the power of reciprocity and fairness to improve relationships and overall mental health and wellbeing. At the beginning of the session, the OT provided an introduction to the topic and defined the session's objectives. The participants then engaged in a discussion about their past experiences with the concept of reciprocity  and how these experiences have influenced their lives and relationships, both positively and negatively. Throughout the session, the OT emphasized the importance of reciprocal relationships and how this concept can serve as a guide for future engagements with relationships and relationship development.  A: The patient actively participated in the therapy session on the power of reciprocity and fairness to improve relationships and overall mental health and wellbeing. They shared their past experiences with the concept of reciprocity and demonstrated an understanding of its importance  in relationships. The patient engaged in meaningful discussion with the other participants and appeared to grasp the objectives of the session. The occupational therapist was able to provide guidance and support as needed, and the patient demonstrated a willingness to apply what was discussed to their own life and relationships.  P: Continue to attend PHP OT group sessions 5x week for 4 weeks to promote daily structure, social engagement, and opportunities to develop and utilize adaptive strategies to maximize functional performance in preparation for safe transition and integration back into school, work, and the community. Plan to address topic of The Power of Reciprocity 2/2 in next OT group session.                     OT Education - 01/10/22 1356     Education Details The Power of Reciprocity 1/2    Person(s) Educated Patient    Methods Explanation;Verbal cues;Handout    Comprehension Need further instruction              OT Short Term Goals - 01/04/22 2101       OT SHORT TERM GOAL #1   Title Pt will be independent w/ psychosocial skills in order to reengage in community re-entry w/ independence upon DC from this OT PHP program.    Time 4    Period Weeks    Status New    Target Date 02/03/22      OT SHORT TERM GOAL #2   Title pt to demonstrate independence w/ goal creation and routine building skills to improve b/iADLs and community re-entry at time of discharge from this PHP OT program.    Time 4    Period Weeks    Status New    Target Date 02/03/22      OT SHORT TERM GOAL #3   Title pt to demonstrate independence in time mgmt skills to improve b/i ADLs w/ success at the time of her discharge from this OT PHP program    Time 4    Period Weeks    Status New    Target Date 02/03/22                      Plan - 01/10/22 1357     Psychosocial Skills Habits;Routines and Behaviors;Interpersonal Interaction;Coping Strategies              Patient will benefit from skilled therapeutic intervention in order to improve the following deficits and impairments:       Psychosocial Skills: Habits, Routines and Behaviors, Interpersonal Interaction, Coping Strategies   Visit Diagnosis: Difficulty coping    Problem List Patient Active Problem List   Diagnosis Date Noted   Vaginal discharge 11/24/2021   Gastroenteritis 06/13/2021   Bronchitis 04/18/2021   Seizure disorder (HCC) 10/25/2020   Thrush 10/25/2020   Amenorrhea 10/06/2020   Ear pain, left 10/06/2020   Schizo-affective schizophrenia ([facility])    Allergy    ADHD (attention deficit hyperactivity disorder)  Depression    Bipolar I disorder ([facility]CC) 05/12/2020   Benzodiazepine dependence ([facility]) 05/12/2020   Morbid obesity ([facility]) 09/06/2019   Possible pregnancy 09/06/2019   Delirium tremens ([facility]CC) 05/20/2019   Seizure (HCC) 05/20/2019   Acute vaginitis 05/20/2019   High risk sexual behavior 05/20/2019   Vitamin D deficiency 03/10/2019   Constipation 04/03/2018   Schizoaffective disorder ([facility]CC) 03/08/2018   Bipolar disorder, current episode manic severe with psychotic features ([facility])    MDD (major depressive disorder), recurrent severe, without psychosis ([facility]CC) 02/11/2018   Pelvic pain 01/31/2018   Genital herpes simplex 10/24/2017   Anxiety 08/30/2017   Severe bipolar I disorder, current or most recent episode depressed ([facility]) 03/30/2017   Syncope 03/03/2017   STD exposure 05/13/2015   Eczema 12/11/2013   Encounter for contraceptive management 03/16/2013   Bipolar disorder (HCC) 12/30/2012   ADD (attention deficit disorder) 04/24/2011    Ted Mcalpine, OT 01/10/2022, 1:57 PM  [facility] PARTIAL HOSPITALIZATION PROGRAM [address] [place], [place], [address] Phone: [phone]   Fax:  [phone]  Name: [name] MRN: [id number] Date of Birth: February 14, 1996

## 2022-01-11 ENCOUNTER — Encounter (HOSPITAL_COMMUNITY): Payer: Self-pay

## 2022-01-11 ENCOUNTER — Other Ambulatory Visit (HOSPITAL_COMMUNITY): Payer: Medicare Other | Attending: Psychiatry | Admitting: Licensed Clinical Social Worker

## 2022-01-11 ENCOUNTER — Other Ambulatory Visit (HOSPITAL_COMMUNITY): Payer: Medicare Other

## 2022-01-11 DIAGNOSIS — Z9151 Personal history of suicidal behavior: Secondary | ICD-10-CM | POA: Diagnosis not present

## 2022-01-11 DIAGNOSIS — R4589 Other symptoms and signs involving emotional state: Secondary | ICD-10-CM | POA: Insufficient documentation

## 2022-01-11 DIAGNOSIS — Z79899 Other long term (current) drug therapy: Secondary | ICD-10-CM | POA: Diagnosis not present

## 2022-01-11 DIAGNOSIS — R45851 Suicidal ideations: Secondary | ICD-10-CM | POA: Diagnosis not present

## 2022-01-11 DIAGNOSIS — F250 Schizoaffective disorder, bipolar type: Secondary | ICD-10-CM | POA: Insufficient documentation

## 2022-01-11 DIAGNOSIS — Z560 Unemployment, unspecified: Secondary | ICD-10-CM | POA: Diagnosis not present

## 2022-01-11 LAB — CERVICOVAGINAL ANCILLARY ONLY
Bacterial Vaginitis (gardnerella): NEGATIVE
Candida Glabrata: NEGATIVE
Candida Vaginitis: NEGATIVE
Chlamydia: NEGATIVE
Comment: NEGATIVE
Comment: NEGATIVE
Comment: NEGATIVE
Comment: NEGATIVE
Comment: NEGATIVE
Comment: NORMAL
Neisseria Gonorrhea: NEGATIVE
Trichomonas: NEGATIVE

## 2022-01-11 NOTE — Therapy (Signed)
[facility], [place], [address] Phone: [phone]   Fax:  [phone]  Occupational Therapy Treatment  Virtual Visit via Video Note  I connected with [name] on 01/11/22 at  8:00 AM EDT by a video enabled telemedicine application and verified that I am speaking with the correct person using two identifiers.  Location: Patient: home Provider: office   I discussed the limitations of evaluation and management by telemedicine and the availability of in person appointments. The patient expressed understanding and agreed to proceed.    The patient was advised to call back or seek an in-person evaluation if the symptoms worsen or if the condition fails to improve as anticipated.  I provided 60 minutes of non-face-to-face time during this encounter.   Patient Details  Name: [name] MRN: [id number] Date of Birth: July 03, 1996 No data recorded  Encounter Date: 01/11/2022   OT End of Session - 01/11/22 1311     Visit Number 4    Number of Visits 20    Date for OT Re-Evaluation 02/03/22    Authorization Type MEDICARE PART A AND B    Authorization Time Period Ind Deduct MET  Fam Deduct MET   Ind OOP 3000-1242.21 met  FAM OOP [phone] met    OT Start Time 1200    OT Stop Time 1300    OT Time Calculation (min) 60 min    Activity Tolerance Patient tolerated treatment well    Behavior During Therapy [facility] for tasks assessed/performed             Past Medical History:  Diagnosis Date   Acute vaginitis 05/20/2019   ADD (attention deficit disorder) 04/24/2011   ADHD (attention deficit hyperactivity disorder)    Allergy    Anxiety    Benzodiazepine dependence ([place]) 05/12/2020   Bipolar disorder ([place])    Bipolar disorder, current episode manic severe with psychotic features ([place])    Bipolar I disorder ([place]) 05/12/2020   Constipation 04/03/2018   Delirium tremens ([place]) 05/20/2019    Depression    Eczema 12/11/13   Genital herpes simplex 10/24/2017   High risk heterosexual behavior 05/20/2019   MDD (major depressive disorder), recurrent severe, without psychosis ([place]) 02/11/2018   Morbid obesity ([place]) 09/06/2019   Pelvic pain 01/31/2018   Possible pregnancy 09/06/2019   Preventative health care 03/16/2013   Schizo-affective schizophrenia ([place])    Schizoaffective disorder ([place]) 03/08/2018   Seizure ([place]) 05/20/2019   Severe bipolar I disorder, current or most recent episode depressed ([place]) 03/30/2017   STD exposure 05/13/2015   Syncope 03/03/2017   Vitamin D deficiency 03/10/2019    History reviewed. No pertinent surgical history.  There were no vitals filed for this visit.   Subjective Assessment - 01/11/22 1311     Currently in Pain? No/denies    Pain Score 0-No pain    Multiple Pain Sites No              Group Session:  S: "Doing better today, getting a better understanding of how reciprocity can improve my life"  O: The therapy session was led by an occupational therapist via telehealth with seven patients in attendance. The focus of the session was on exploring the power of reciprocity and fairness to improve relationships and overall mental health and wellbeing. At the beginning of the session, the OT provided an introduction to the topic and defined the session's objectives. The participants then engaged in  a discussion about their past experiences with the concept of reciprocity and how these experiences have influenced their lives and relationships, both positively and negatively. Throughout the session, the OT emphasized the importance of reciprocal relationships and how this concept can serve as a guide for future engagements with relationships and relationship development.  A: The patient actively participated in the therapy session on the power of reciprocity and fairness to improve relationships and overall mental health and wellbeing. They shared their past  experiences with the concept of reciprocity and demonstrated an understanding of its importance in relationships. The patient engaged in meaningful discussion with the other participants and appeared to grasp the objectives of the session. The occupational therapist was able to provide guidance and support as needed, and the patient demonstrated a willingness to apply what was discussed to their own life and relationships.  P: Continue to attend PHP OT group sessions 5x week for 4 weeks to promote daily structure, social engagement, and opportunities to develop and utilize adaptive strategies to maximize functional performance in preparation for safe transition and integration back into school, work, and the community. Plan to address topic of tbd in next OT group session.                     OT Education - 01/11/22 1311     Education Details The Power of Reciprocity 2/2    Person(s) Educated Patient    Methods Explanation;Verbal cues;Handout    Comprehension Need further instruction              OT Short Term Goals - 01/04/22 2101       OT SHORT TERM GOAL #1   Title Pt will be independent w/ psychosocial skills in order to reengage in community re-entry w/ independence upon DC from this OT PHP program.    Time 4    Period Weeks    Status New    Target Date 02/03/22      OT SHORT TERM GOAL #2   Title pt to demonstrate independence w/ goal creation and routine building skills to improve b/iADLs and community re-entry at time of discharge from this PHP OT program.    Time 4    Period Weeks    Status New    Target Date 02/03/22      OT SHORT TERM GOAL #3   Title pt to demonstrate independence in time mgmt skills to improve b/i ADLs w/ success at the time of her discharge from this OT PHP program    Time 4    Period Weeks    Status New    Target Date 02/03/22                      Plan - 01/11/22 1312     Psychosocial Skills Habits;Routines and  Behaviors;Interpersonal Interaction;Coping Strategies             Patient will benefit from skilled therapeutic intervention in order to improve the following deficits and impairments:       Psychosocial Skills: Habits, Routines and Behaviors, Interpersonal Interaction, Coping Strategies   Visit Diagnosis: Difficulty coping    Problem List Patient Active Problem List   Diagnosis Date Noted   Vaginal discharge 11/24/2021   Gastroenteritis 06/13/2021   Bronchitis 04/18/2021   Seizure disorder ([place]) 10/25/2020   Thrush 10/25/2020   Amenorrhea 10/06/2020   Ear pain, left 10/06/2020   Schizo-affective schizophrenia ([place])    Allergy  ADHD (attention deficit hyperactivity disorder)    Depression    Bipolar I disorder ([place]) 05/12/2020   Benzodiazepine dependence ([place]) 05/12/2020   Morbid obesity ([place]) 09/06/2019   Possible pregnancy 09/06/2019   Delirium tremens ([place]) 05/20/2019   Seizure ([place]) 05/20/2019   Acute vaginitis 05/20/2019   High risk sexual behavior 05/20/2019   Vitamin D deficiency 03/10/2019   Constipation 04/03/2018   Schizoaffective disorder ([place]) 03/08/2018   Bipolar disorder, current episode manic severe with psychotic features ([place])    MDD (major depressive disorder), recurrent severe, without psychosis ([place]) 02/11/2018   Pelvic pain 01/31/2018   Genital herpes simplex 10/24/2017   Anxiety 08/30/2017   Severe bipolar I disorder, current or most recent episode depressed ([place]) 03/30/2017   Syncope 03/03/2017   STD exposure 05/13/2015   Eczema 12/11/2013   Encounter for contraceptive management 03/16/2013   Bipolar disorder ([place]) 12/30/2012   ADD (attention deficit disorder) 04/24/2011    Brantley Stage, OT 01/11/2022, 1:13 PM  [facility], [place], [place], [address] Phone: [phone]   Fax:  [phone]  Name: [name] MRN:  [id number] Date of Birth: Sep 28, 1995

## 2022-01-12 ENCOUNTER — Other Ambulatory Visit (HOSPITAL_COMMUNITY): Payer: Medicare Other | Admitting: Licensed Clinical Social Worker

## 2022-01-12 ENCOUNTER — Other Ambulatory Visit (HOSPITAL_COMMUNITY): Payer: Medicare Other

## 2022-01-12 ENCOUNTER — Encounter (HOSPITAL_COMMUNITY): Payer: Self-pay

## 2022-01-12 DIAGNOSIS — F250 Schizoaffective disorder, bipolar type: Secondary | ICD-10-CM | POA: Diagnosis not present

## 2022-01-12 DIAGNOSIS — R4589 Other symptoms and signs involving emotional state: Secondary | ICD-10-CM

## 2022-01-12 NOTE — Therapy (Addendum)
[facility] PARTIAL HOSPITALIZATION PROGRAM [address] [place], [place], [address] Phone: [phone]   Fax:  [phone]  Occupational Therapy Treatment  Virtual Visit via Video Note  I connected with [name] on 01/12/22 at  8:00 AM EDT by a video enabled telemedicine application and verified that I am speaking with the correct person using two identifiers.  Location: Patient: home Provider: office   I discussed the limitations of evaluation and management by telemedicine and the availability of in person appointments. The patient expressed understanding and agreed to proceed.    The patient was advised to call back or seek an in-person evaluation if the symptoms worsen or if the condition fails to improve as anticipated.  I provided 55 minutes of non-face-to-face time during this encounter.   Patient Details  Name: [name] MRN: [id number] Date of Birth: 1995/11/17 No data recorded  Encounter Date: 01/12/2022   OT End of Session - 01/12/22 1823     Visit Number 5    Number of Visits 20    Date for OT Re-Evaluation 02/03/22    Authorization Type MEDICARE PART A AND B    Authorization Time Period Ind Deduct MET  Fam Deduct MET   Ind OOP 3000-1242.21 met  FAM OOP 3653-1139.49 met    OT Start Time 1200    OT Stop Time 1255    OT Time Calculation (min) 55 min             Past Medical History:  Diagnosis Date   Acute vaginitis 05/20/2019   ADD (attention deficit disorder) 04/24/2011   ADHD (attention deficit hyperactivity disorder)    Allergy    Anxiety    Benzodiazepine dependence (HCC) 05/12/2020   Bipolar disorder (H[facility])    Bipolar disorder, current episode manic severe with psychotic features ([facility]CC)    Bipolar I disorder (HCC) 05/12/2020   Constipation 04/03/2018   Delirium tremens ([facility]) 05/20/2019   Depression    Eczema 12/11/13   Genital herpes simplex 10/24/2017   High risk heterosexual behavior 05/20/2019   MDD  (major depressive disorder), recurrent severe, without psychosis (HCC) 02/11/2018   Morbid obesity (HCC) 09/06/2019   Pelvic pain 01/31/2018   Possible pregnancy 09/06/2019   Preventative health care 03/16/2013   Schizo-affective schizophrenia (HCC)    Schizoaffective disorder (HCC) 03/08/2018   Seizure (HCC) 05/20/2019   Severe bipolar I disorder, current or most recent episode depressed (HCC) 03/30/2017   STD exposure 05/13/2015   Syncope 03/03/2017   Vitamin D deficiency 03/10/2019    History reviewed. No pertinent surgical history.  There were no vitals filed for this visit.   Subjective Assessment - 01/12/22 1823     Currently in Pain? No/denies    Pain Score 0-No pain    Multiple Pain Sites No              Group Session:  S: "I have trouble sleeping and it does cause problems with my memory and ability to concentrate on things t/out the day"  O: During the group therapy session, the occupational therapist discussed the impact of sleep disturbances on daily activities and overall health and wellbeing.     The OT also reviewed various types of sleep disorders, including insomnia, sleep apnea, restless leg syndrome, and narcolepsy, and their associated symptoms. Strategies for managing and treating sleep disturbances were also discussed, such as establishing a consistent sleep routine, avoiding stimulants before bedtime, and engaging in relaxation techniques.  Today's group also included information on how sleep disturbances can cause fatigue, mood changes, cognitive impairment, and physical health problems, and emphasizes the importance of seeking prompt treatment to maintain overall health and wellbeing.   A: Based on today's performance, patient would continue to benefit from cont'd PHP OT group therapy intervention in order to address the deficits that impact and inhibit independence in community re-entry and psychosocial dynamics / skills.  P: Continue to attend PHP OT group  sessions 5x week for 4 weeks to promote daily structure, social engagement, and opportunities to develop and utilize adaptive strategies to maximize functional performance in preparation for safe transition and integration back into school, work, and the community. Plan to address topic of Sleep Hygiene in next OT group session.                     OT Education - 01/12/22 1823     Education Details Sleep Hygiene              OT Short Term Goals - 01/04/22 2101       OT SHORT TERM GOAL #1   Title Pt will be independent w/ psychosocial skills in order to reengage in community re-entry w/ independence upon DC from this OT PHP program.    Time 4    Period Weeks    Status New    Target Date 02/03/22      OT SHORT TERM GOAL #2   Title pt to demonstrate independence w/ goal creation and routine building skills to improve b/iADLs and community re-entry at time of discharge from this PHP OT program.    Time 4    Period Weeks    Status New    Target Date 02/03/22      OT SHORT TERM GOAL #3   Title pt to demonstrate independence in time mgmt skills to improve b/i ADLs w/ success at the time of her discharge from this OT PHP program    Time 4    Period Weeks    Status New    Target Date 02/03/22                      Plan - 01/12/22 1824     Psychosocial Skills Habits;Routines and Behaviors;Interpersonal Interaction;Coping Strategies             Patient will benefit from skilled therapeutic intervention in order to improve the following deficits and impairments:       Psychosocial Skills: Habits, Routines and Behaviors, Interpersonal Interaction, Coping Strategies   Visit Diagnosis: Difficulty coping    Problem List Patient Active Problem List   Diagnosis Date Noted   Vaginal discharge 11/24/2021   Gastroenteritis 06/13/2021   Bronchitis 04/18/2021   Seizure disorder ([place]) 10/25/2020   Thrush 10/25/2020   Amenorrhea 10/06/2020   Ear  pain, left 10/06/2020   Schizo-affective schizophrenia ([place])    Allergy    ADHD (attention deficit hyperactivity disorder)    Depression    Bipolar I disorder ([place]) 05/12/2020   Benzodiazepine dependence ([place]) 05/12/2020   Morbid obesity ([place]) 09/06/2019   Possible pregnancy 09/06/2019   Delirium tremens ([place]) 05/20/2019   Seizure ([place]) 05/20/2019   Acute vaginitis 05/20/2019   High risk sexual behavior 05/20/2019   Vitamin D deficiency 03/10/2019   Constipation 04/03/2018   Schizoaffective disorder ([place]) 03/08/2018   Bipolar disorder, current episode manic severe with psychotic features ([place])    MDD (major depressive disorder), recurrent  severe, without psychosis ([place]) 02/11/2018   Pelvic pain 01/31/2018   Genital herpes simplex 10/24/2017   Anxiety 08/30/2017   Severe bipolar I disorder, current or most recent episode depressed ([place]) 03/30/2017   Syncope 03/03/2017   STD exposure 05/13/2015   Eczema 12/11/2013   Encounter for contraceptive management 03/16/2013   Bipolar disorder ([place]) 12/30/2012   ADD (attention deficit disorder) 04/24/2011    Brantley Stage, OT 01/12/2022, 6:24 PM  [facility], [place], [place], [address] Phone: [phone]   Fax:  [phone]  Name: [name] MRN: [id number] Date of Birth: [birth year]

## 2022-01-15 ENCOUNTER — Other Ambulatory Visit (HOSPITAL_COMMUNITY): Payer: Medicare Other

## 2022-01-15 ENCOUNTER — Ambulatory Visit (HOSPITAL_COMMUNITY): Payer: Medicare Other

## 2022-01-16 ENCOUNTER — Other Ambulatory Visit (HOSPITAL_COMMUNITY): Payer: Medicare Other

## 2022-01-16 ENCOUNTER — Ambulatory Visit (HOSPITAL_COMMUNITY): Payer: Medicare Other

## 2022-01-17 ENCOUNTER — Other Ambulatory Visit (HOSPITAL_COMMUNITY): Payer: Medicare Other

## 2022-01-17 ENCOUNTER — Other Ambulatory Visit (HOSPITAL_COMMUNITY): Payer: Medicare Other | Admitting: Licensed Clinical Social Worker

## 2022-01-17 DIAGNOSIS — F250 Schizoaffective disorder, bipolar type: Secondary | ICD-10-CM | POA: Diagnosis not present

## 2022-01-17 NOTE — Progress Notes (Signed)
Spoke with patient via Webex video call, used 2 identifiers to correctly identify patient. States that she is having a bad day thinking about all her stress. She is unemployed, no hot water at her house, and may need to move in with family. She is unable to get a job until they complete their move, she also lost her license and car due to her mental illness. Hears voices that are encouraging but they are starting to annoy her. Groups are going good and she is glad that it gives her something to do for several hours a day. Passive SI. Was having thoughts this morning and her mother gave her medications and locked them away. She has no plan or intent just feeling sad about her current situation. Denies HI. Denies visual hallucinations. On scale 1-10 as 10 being worst she rates depression at 6 and anxiety at 0. No side effects from medication. No issues or complaints.

## 2022-01-18 ENCOUNTER — Other Ambulatory Visit (HOSPITAL_COMMUNITY): Payer: Medicare Other | Admitting: Licensed Clinical Social Worker

## 2022-01-18 ENCOUNTER — Other Ambulatory Visit (HOSPITAL_COMMUNITY): Payer: Medicare Other

## 2022-01-18 ENCOUNTER — Encounter (HOSPITAL_COMMUNITY): Payer: Self-pay

## 2022-01-18 DIAGNOSIS — R4589 Other symptoms and signs involving emotional state: Secondary | ICD-10-CM

## 2022-01-18 DIAGNOSIS — F250 Schizoaffective disorder, bipolar type: Secondary | ICD-10-CM

## 2022-01-18 NOTE — Therapy (Signed)
[place], [place], [address] Phone: [phone]   Fax:  [phone]  Occupational Therapy Treatment Virtual Visit via Video Note  I connected with [name] on 01/18/22 at  8:00 AM EDT by a video enabled telemedicine application and verified that I am speaking with the correct person using two identifiers.  Location: Patient: home Provider: office   I discussed the limitations of evaluation and management by telemedicine and the availability of in person appointments. The patient expressed understanding and agreed to proceed.    The patient was advised to call back or seek an in-person evaluation if the symptoms worsen or if the condition fails to improve as anticipated.  I provided 55 minutes of non-face-to-face time during this encounter.   Patient Details  Name: [name] MRN: [id number] Date of Birth: 02-02-1996 No data recorded  Encounter Date: 01/18/2022   OT End of Session - 01/18/22 1354     Visit Number 6    Number of Visits 20    Date for OT Re-Evaluation 02/03/22    Authorization Type MEDICARE PART A AND B    Authorization Time Period Ind Deduct MET  Fam Deduct MET   Ind OOP 3000-1242.21 met  FAM OOP 5056-9794.80 met    OT Start Time 1200    OT Stop Time 1255    OT Time Calculation (min) 55 min             Past Medical History:  Diagnosis Date   Acute vaginitis 05/20/2019   ADD (attention deficit disorder) 04/24/2011   ADHD (attention deficit hyperactivity disorder)    Allergy    Anxiety    Benzodiazepine dependence ([place]) 05/12/2020   Bipolar disorder ([place])    Bipolar disorder, current episode manic severe with psychotic features ([place])    Bipolar I disorder ([place]) 05/12/2020   Constipation 04/03/2018   Delirium tremens ([place]) 05/20/2019   Depression    Eczema 12/11/13   Genital herpes simplex 10/24/2017   High risk heterosexual behavior 05/20/2019   MDD (major  depressive disorder), recurrent severe, without psychosis ([place]) 02/11/2018   Morbid obesity ([place]) 09/06/2019   Pelvic pain 01/31/2018   Possible pregnancy 09/06/2019   Preventative health care 03/16/2013   Schizo-affective schizophrenia ([place])    Schizoaffective disorder ([place]) 03/08/2018   Seizure ([place]) 05/20/2019   Severe bipolar I disorder, current or most recent episode depressed ([place]) 03/30/2017   STD exposure 05/13/2015   Syncope 03/03/2017   Vitamin D deficiency 03/10/2019    History reviewed. No pertinent surgical history.  There were no vitals filed for this visit.   Subjective Assessment - 01/18/22 1354     Currently in Pain? No/denies    Pain Score 0-No pain             Group Session:  S: "Doing good today"  O: During the group therapy session on Stepping Out of Your Comfort Zone, patients actively engaged in discussions and activities focused on understanding the concept of the comfort zone and its impact on personal growth. The session aimed to provide patients with practical strategies and techniques for expanding their comfort zones, fostering a supportive and collaborative environment.  Participants openly shared their experiences with depression and anxiety, expressing their desire for personal growth and a willingness to overcome the limitations imposed by their comfort zones. Interactive activities and group discussions allowed patients to explore misconceptions surrounding the comfort zone and the potential for  growth beyond it. Inspirational quotes from historical figures were shared to inspire and motivate patients in their journey of stepping outside their comfort zones.  The session facilitated a safe space for patients to voice their thoughts, concerns, and aspirations related to stepping beyond their comfort zones. Therapeutic interventions, such as gradual exposure, setting achievable goals, seeking support, and embracing challenges, were explored. The group fostered  open communication, empathy, and collaboration among patients, creating an environment conducive to personal growth and self-discovery.  Moving forward, future group sessions will continue to delve into practical strategies and techniques, incorporating activities like role-playing, creative expression, and mindfulness exercises. Patients will be encouraged to set individual goals, track their progress, and share their achievements and challenges in subsequent sessions. Additional resources, such as articles, books, or worksheets, will be provided to further support patients' understanding and application of stepping outside their comfort zones.  A: During the virtual group therapy session on Stepping Out of Your Comfort Zone, the patient exhibited minimal participation and contribution to the discussions. They appeared less willing to share personal experiences and express thoughts or concerns related to the topic. The patient's level of engagement seemed passive, as they listened more than actively participated. Their limited interaction in the session indicated a potential reluctance or discomfort in stepping outside their comfort zone. Additional support and encouragement may be necessary to facilitate their active engagement in future sessions and to foster a stronger sense of involvement within the group dynamic.  P: Continue to attend PHP OT group sessions 5x week for 4 weeks to promote daily structure, social engagement, and opportunities to develop and utilize adaptive strategies to maximize functional performance in preparation for safe transition and integration back into school, work, and the community. Plan to address topic of tbd in next OT group session.                      OT Education - 01/18/22 1354     Education Details Embracing Growth: Stepping Beyond Your Comfort Zone              OT Short Term Goals - 01/04/22 2101       OT SHORT TERM GOAL #1   Title  Pt will be independent w/ psychosocial skills in order to reengage in community re-entry w/ independence upon DC from this OT PHP program.    Time 4    Period Weeks    Status New    Target Date 02/03/22      OT SHORT TERM GOAL #2   Title pt to demonstrate independence w/ goal creation and routine building skills to improve b/iADLs and community re-entry at time of discharge from this PHP OT program.    Time 4    Period Weeks    Status New    Target Date 02/03/22      OT SHORT TERM GOAL #3   Title pt to demonstrate independence in time mgmt skills to improve b/i ADLs w/ success at the time of her discharge from this OT PHP program    Time 4    Period Weeks    Status New    Target Date 02/03/22                      Plan - 01/18/22 1354     Psychosocial Skills Habits;Routines and Behaviors;Interpersonal Interaction;Coping Strategies             Patient will benefit from skilled therapeutic  intervention in order to improve the following deficits and impairments:       Psychosocial Skills: Habits, Routines and Behaviors, Interpersonal Interaction, Coping Strategies   Visit Diagnosis: Difficulty coping    Problem List Patient Active Problem List   Diagnosis Date Noted   Vaginal discharge 11/24/2021   Gastroenteritis 06/13/2021   Bronchitis 04/18/2021   Seizure disorder ([place]) 10/25/2020   Thrush 10/25/2020   Amenorrhea 10/06/2020   Ear pain, left 10/06/2020   Schizo-affective schizophrenia ([place])    Allergy    ADHD (attention deficit hyperactivity disorder)    Depression    Bipolar I disorder ([place]) 05/12/2020   Benzodiazepine dependence ([place]) 05/12/2020   Morbid obesity ([place]) 09/06/2019   Possible pregnancy 09/06/2019   Delirium tremens ([place]) 05/20/2019   Seizure ([place]) 05/20/2019   Acute vaginitis 05/20/2019   High risk sexual behavior 05/20/2019   Vitamin D deficiency 03/10/2019   Constipation 04/03/2018   Schizoaffective disorder ([place])  03/08/2018   Bipolar disorder, current episode manic severe with psychotic features ([place])    MDD (major depressive disorder), recurrent severe, without psychosis ([place]) 02/11/2018   Pelvic pain 01/31/2018   Genital herpes simplex 10/24/2017   Anxiety 08/30/2017   Severe bipolar I disorder, current or most recent episode depressed ([place]) 03/30/2017   Syncope 03/03/2017   STD exposure 05/13/2015   Eczema 12/11/2013   Encounter for contraceptive management 03/16/2013   Bipolar disorder ([place]) 12/30/2012   ADD (attention deficit disorder) 04/24/2011    Brantley Stage, OT 01/18/2022, 1:55 PM  [facility], [place], [place], [address] Phone: [phone]   Fax:  [phone]  Name: [name] MRN: [id number] Date of Birth: Jan 01, 1996

## 2022-01-19 ENCOUNTER — Other Ambulatory Visit (HOSPITAL_COMMUNITY): Payer: Medicare Other | Admitting: Licensed Clinical Social Worker

## 2022-01-19 ENCOUNTER — Encounter (HOSPITAL_COMMUNITY): Payer: Self-pay

## 2022-01-19 ENCOUNTER — Other Ambulatory Visit (HOSPITAL_COMMUNITY): Payer: Medicare Other

## 2022-01-19 ENCOUNTER — Encounter (HOSPITAL_COMMUNITY): Payer: Self-pay | Admitting: Family

## 2022-01-19 DIAGNOSIS — R4589 Other symptoms and signs involving emotional state: Secondary | ICD-10-CM

## 2022-01-19 DIAGNOSIS — F250 Schizoaffective disorder, bipolar type: Secondary | ICD-10-CM

## 2022-01-19 NOTE — Progress Notes (Signed)
Virtual Visit via Video Note  I connected with [name] on 01/19/22 at  9:00 AM EDT by a video enabled telemedicine application and verified that I am speaking with the correct person using two identifiers.  Location: Patient: home Provider: Office   I discussed the limitations of evaluation and management by telemedicine and the availability of in person appointments. The patient expressed understanding and agreed to proceed.   I discussed the assessment and treatment plan with the patient. The patient was provided an opportunity to ask questions and all were answered. The patient agreed with the plan and demonstrated an understanding of the instructions.   The patient was advised to call back or seek an in-person evaluation if the symptoms worsen or if the condition fails to improve as anticipated.  I provided 15 minutes of non-face-to-face time during this encounter.   Derrill Center, NP   [facility] Partial hospitalization outpatient Program Discharge Summary  [name] [id number]  Admission date: 01/07/2022 Discharge date: 01/23/2022  Reason for admission: :[name] is a 26 y.o. African American female presents with suicidal ideation with plan and reported attempt. Stated feeling depressed about her unemployment status. Reported previous inpatient admission in 2018. Denied illicit drug use or substance abuse. She agrees with information provided in the below discharge assessment.     Progress in Program Toward Treatment Goals: Progressing patient attended and participated within the group session with active and engaged participation.  Denying suicidal or homicidal ideations.  Denies auditory visual hallucinations.  States her goal is to continue focusing on her health.  States she has been medication compliant and tolerating medications well.  Progress (rationale): Patient to stepdown to intensive outpatient program  Collaboration of Care:  Medication Management AEB continue Wellbutrin and Seroquel  Patient/Guardian was advised Release of Information must be obtained prior to any record release in order to collaborate their care with an outside provider. Patient/Guardian was advised if they have not already done so to contact the registration department to sign all necessary forms in order for [name] to release information regarding their care.   Consent: Patient/Guardian gives verbal consent for treatment and assignment of benefits for services provided during this visit. Patient/Guardian expressed understanding and agreed to proceed.   Derrill Center, NP 01/19/2022

## 2022-01-19 NOTE — Therapy (Signed)
[place], [place], [address] Phone: [phone]   Fax:  [phone]  Occupational Therapy Treatment Virtual Visit via Video Note  I connected with [name] on 01/19/22 at  8:00 AM EDT by a video enabled telemedicine application and verified that I am speaking with the correct person using two identifiers.  Location: Patient: home Provider: office   I discussed the limitations of evaluation and management by telemedicine and the availability of in person appointments. The patient expressed understanding and agreed to proceed.    The patient was advised to call back or seek an in-person evaluation if the symptoms worsen or if the condition fails to improve as anticipated.  I provided 55 minutes of non-face-to-face time during this encounter.   Patient Details  Name: [name] MRN: [id number] Date of Birth: 10-17-1995 No data recorded  Encounter Date: 01/19/2022   OT End of Session - 01/19/22 1405     Visit Number 7    Number of Visits 20    Date for OT Re-Evaluation 02/03/22    Authorization Type MEDICARE PART A AND B    Authorization Time Period Ind Deduct MET  Fam Deduct MET   Ind OOP 3000-1242.21 met  FAM OOP 6962-9528.41 met    OT Start Time 1200    OT Stop Time 1255    OT Time Calculation (min) 55 min             Past Medical History:  Diagnosis Date   Acute vaginitis 05/20/2019   ADD (attention deficit disorder) 04/24/2011   ADHD (attention deficit hyperactivity disorder)    Allergy    Anxiety    Benzodiazepine dependence ([place]) 05/12/2020   Bipolar disorder ([place])    Bipolar disorder, current episode manic severe with psychotic features ([place])    Bipolar I disorder ([place]) 05/12/2020   Constipation 04/03/2018   Delirium tremens ([place]) 05/20/2019   Depression    Eczema 12/11/13   Genital herpes simplex 10/24/2017   High risk heterosexual behavior 05/20/2019   MDD (major  depressive disorder), recurrent severe, without psychosis ([place]) 02/11/2018   Morbid obesity ([place]) 09/06/2019   Pelvic pain 01/31/2018   Possible pregnancy 09/06/2019   Preventative health care 03/16/2013   Schizo-affective schizophrenia ([place])    Schizoaffective disorder ([place]) 03/08/2018   Seizure ([place]) 05/20/2019   Severe bipolar I disorder, current or most recent episode depressed ([place]) 03/30/2017   STD exposure 05/13/2015   Syncope 03/03/2017   Vitamin D deficiency 03/10/2019    History reviewed. No pertinent surgical history.  There were no vitals filed for this visit.   Subjective Assessment - 01/19/22 1404     Currently in Pain? No/denies    Pain Score 0-No pain              Group Session:  S: "I will use some of these techniques to improve my ability to take up reading again, because I do miss it"  O: The objective of the group therapy session was to explore the concept of working memory and its relationship with learning, specifically tailored to individuals experiencing mental health challenges. The session aimed to provide psychoeducation on the importance of intact working memory for attention, comprehension, and retention of information. Participants discussed common short-term memory problems they face in their lives, including forgetting names, misplacing items, and struggling with memorization. They recognized the impact of depression, anxiety, and other mental health conditions on their working  memory functioning.  The group facilitator guided the participants in brainstorming creative strategies to overcome these memory challenges within their individual capabilities. The group emphasized self-help approaches that could be implemented independently, taking into consideration the participants' mental health limitations. The session concluded with a focus on the importance of self-care, seeking professional help when needed, and the potential benefits of incorporating cognitive  training exercises to enhance working memory.  Overall, the objective of the group therapy session was to provide a supportive and informative environment for participants to explore the topic of working memory, understand its relevance to their learning experiences, and equip them with practical strategies to mitigate memory-related difficulties in their daily lives.  A: During the telehealth session, the patient actively participated in the discussion on working memory and its impact on learning. They demonstrated a high level of interest and curiosity, actively sharing their experiences and challenges related to memory difficulties. The patient expressed frustration and a genuine desire to understand the interrelation between working memory, their mental health conditions (such as depression and anxiety), and their learning abilities.  The patient showed a strong willingness to learn and implement practical strategies to enhance their working memory within the limitations of their mental health condition. They actively contributed to the brainstorming session, providing creative ideas and actively seeking guidance from the group and the therapist. Their proactive approach and active involvement in the session indicate a strong motivation to improve their cognitive functioning and overall well-being.  P: Continue to attend PHP OT group sessions 5x week for 4 weeks to promote daily structure, social engagement, and opportunities to develop and utilize adaptive strategies to maximize functional performance in preparation for safe transition and integration back into school, work, and the community. Plan to address topic of The Significance of Working Memory and its Impact on Adult Learning 2/2 in next OT group session.                     OT Education - 01/19/22 1404     Education Details The Significance of Working Memory and its Impact on Adult Learning 1/2              OT  Short Term Goals - 01/04/22 2101       OT SHORT TERM GOAL #1   Title Pt will be independent w/ psychosocial skills in order to reengage in community re-entry w/ independence upon DC from this OT PHP program.    Time 4    Period Weeks    Status New    Target Date 02/03/22      OT SHORT TERM GOAL #2   Title pt to demonstrate independence w/ goal creation and routine building skills to improve b/iADLs and community re-entry at time of discharge from this PHP OT program.    Time 4    Period Weeks    Status New    Target Date 02/03/22      OT SHORT TERM GOAL #3   Title pt to demonstrate independence in time mgmt skills to improve b/i ADLs w/ success at the time of her discharge from this OT PHP program    Time 4    Period Weeks    Status New    Target Date 02/03/22                      Plan - 01/19/22 1405     Psychosocial Skills Habits;Routines and Behaviors;Interpersonal Interaction;Coping Strategies  Patient will benefit from skilled therapeutic intervention in order to improve the following deficits and impairments:       Psychosocial Skills: Habits, Routines and Behaviors, Interpersonal Interaction, Coping Strategies   Visit Diagnosis: Difficulty coping    Problem List Patient Active Problem List   Diagnosis Date Noted   Vaginal discharge 11/24/2021   Gastroenteritis 06/13/2021   Bronchitis 04/18/2021   Seizure disorder ([place]) 10/25/2020   Thrush 10/25/2020   Amenorrhea 10/06/2020   Ear pain, left 10/06/2020   Schizo-affective schizophrenia ([place])    Allergy    ADHD (attention deficit hyperactivity disorder)    Depression    Bipolar I disorder ([place]) 05/12/2020   Benzodiazepine dependence ([place]) 05/12/2020   Morbid obesity ([place]) 09/06/2019   Possible pregnancy 09/06/2019   Delirium tremens ([place]) 05/20/2019   Seizure ([place]) 05/20/2019   Acute vaginitis 05/20/2019   High risk sexual behavior 05/20/2019   Vitamin D deficiency  03/10/2019   Constipation 04/03/2018   Schizoaffective disorder ([place]) 03/08/2018   Bipolar disorder, current episode manic severe with psychotic features ([place])    MDD (major depressive disorder), recurrent severe, without psychosis ([place]) 02/11/2018   Pelvic pain 01/31/2018   Genital herpes simplex 10/24/2017   Anxiety 08/30/2017   Severe bipolar I disorder, current or most recent episode depressed ([place]) 03/30/2017   Syncope 03/03/2017   STD exposure 05/13/2015   Eczema 12/11/2013   Encounter for contraceptive management 03/16/2013   Bipolar disorder ([place]) 12/30/2012   ADD (attention deficit disorder) 04/24/2011    Brantley Stage, OT 01/19/2022, 2:05 PM  Cornell Barman, OT   [facility] HOSPITALIZATION PROGRAM [place], [place], [address] Phone: [phone]   Fax:  [phone]  Name: GLENDORA CLOUATRE MRN: [id number] Date of Birth: 25-Dec-1995

## 2022-01-22 ENCOUNTER — Encounter (HOSPITAL_COMMUNITY): Payer: Self-pay

## 2022-01-22 ENCOUNTER — Other Ambulatory Visit (HOSPITAL_COMMUNITY): Payer: Medicare Other

## 2022-01-22 ENCOUNTER — Other Ambulatory Visit (HOSPITAL_COMMUNITY): Payer: Medicare Other | Admitting: Licensed Clinical Social Worker

## 2022-01-22 DIAGNOSIS — R4589 Other symptoms and signs involving emotional state: Secondary | ICD-10-CM

## 2022-01-22 DIAGNOSIS — F250 Schizoaffective disorder, bipolar type: Secondary | ICD-10-CM | POA: Diagnosis not present

## 2022-01-22 NOTE — Progress Notes (Signed)
Spoke with patient via Webex video call, used 2 identifiers to correctly identify patient.States that groups are going good. States she was given Xyzal from her PCP for allergies and it has helped her voices to go become less. She is better able to focus and does not feel paranoid. On scale 1-10 as 10 being worst she rates depression at 8 and anxiety at 0. Denies SI/HI or AV hallucinations. Continues to hear voices but they have became less. No commands. No visual hallucinations. No issues or complaints. No side effects from medications.

## 2022-01-22 NOTE — Therapy (Signed)
[place], [place], [address] Phone: [phone]   Fax:  [phone]  Occupational Therapy Treatment Virtual Visit via Video Note  I connected with [name] on 01/22/22 at  8:00 AM EDT by a video enabled telemedicine application and verified that I am speaking with the correct person using two identifiers.  Location: Patient: home Provider: office   I discussed the limitations of evaluation and management by telemedicine and the availability of in person appointments. The patient expressed understanding and agreed to proceed.    The patient was advised to call back or seek an in-person evaluation if the symptoms worsen or if the condition fails to improve as anticipated.  I provided 60 minutes of non-face-to-face time during this encounter.   Patient Details  Name: [name] MRN: [id number] Date of Birth: 1996/04/28 No data recorded  Encounter Date: 01/22/2022   OT End of Session - 01/22/22 1336     Visit Number 8    Number of Visits 20    Date for OT Re-Evaluation 02/03/22    Authorization Type MEDICARE PART A AND B    Authorization Time Period Ind Deduct MET  Fam Deduct MET   Ind OOP 3000-1242.21 met  FAM OOP [phone] met    OT Start Time 66    OT Stop Time 1200    OT Time Calculation (min) 60 min             Past Medical History:  Diagnosis Date   Acute vaginitis 05/20/2019   ADD (attention deficit disorder) 04/24/2011   ADHD (attention deficit hyperactivity disorder)    Allergy    Anxiety    Benzodiazepine dependence ([place]) 05/12/2020   Bipolar disorder ([place])    Bipolar disorder, current episode manic severe with psychotic features ([place])    Bipolar I disorder ([place]) 05/12/2020   Constipation 04/03/2018   Delirium tremens ([place]) 05/20/2019   Depression    Eczema 12/11/13   Genital herpes simplex 10/24/2017   High risk heterosexual behavior 05/20/2019   MDD (major  depressive disorder), recurrent severe, without psychosis ([place]) 02/11/2018   Morbid obesity ([place]) 09/06/2019   Pelvic pain 01/31/2018   Possible pregnancy 09/06/2019   Preventative health care 03/16/2013   Schizo-affective schizophrenia ([place])    Schizoaffective disorder ([place]) 03/08/2018   Seizure ([place]) 05/20/2019   Severe bipolar I disorder, current or most recent episode depressed ([place]) 03/30/2017   STD exposure 05/13/2015   Syncope 03/03/2017   Vitamin D deficiency 03/10/2019    History reviewed. No pertinent surgical history.  There were no vitals filed for this visit.   Subjective Assessment - 01/22/22 1335     Currently in Pain? No/denies    Pain Score 0-No pain               Group Session:  S: feeling better today. I didn't read anything over the weekend really.  O: The objective of the group therapy session was to explore the concept of working memory and its relationship with learning, specifically tailored to individuals experiencing mental health challenges. The session aimed to provide psychoeducation on the importance of intact working memory for attention, comprehension, and retention of information. Participants discussed common short-term memory problems they face in their lives, including forgetting names, misplacing items, and struggling with memorization. They recognized the impact of depression, anxiety, and other mental health conditions on their working memory functioning.  The group facilitator guided the participants  in brainstorming creative strategies to overcome these memory challenges within their individual capabilities. The group emphasized self-help approaches that could be implemented independently, taking into consideration the participants' mental health limitations. The session concluded with a focus on the importance of self-care, seeking professional help when needed, and the potential benefits of incorporating cognitive training exercises to enhance  working memory.  Overall, the objective of the group therapy session was to provide a supportive and informative environment for participants to explore the topic of working memory, understand its relevance to their learning experiences, and equip them with practical strategies to mitigate memory-related difficulties in their daily lives.   A: During the telehealth session, the patient actively participated in the discussion on working memory and its impact on learning. They demonstrated a high level of interest and curiosity, actively sharing their experiences and challenges related to memory difficulties. The patient expressed frustration and a genuine desire to understand the interrelation between working memory, their mental health conditions (such as depression and anxiety), and their learning abilities.  The patient showed a strong willingness to learn and implement practical strategies to enhance their working memory within the limitations of their mental health condition. They actively contributed to the brainstorming session, providing creative ideas and actively seeking guidance from the group and the therapist. Their proactive approach and active involvement in the session indicate a strong motivation to improve their cognitive functioning and overall well-being.  P: Continue to attend PHP OT group sessions 5x week for 4 weeks to promote daily structure, social engagement, and opportunities to develop and utilize adaptive strategies to maximize functional performance in preparation for safe transition and integration back into school, work, and the community. Plan to address topic of tbd in next OT group session.                    OT Education - 01/22/22 1336     Education Details The Significance of Working Memory and its Impact on Adult Learning 2/2              OT Short Term Goals - 01/04/22 2101       OT SHORT TERM GOAL #1   Title Pt will be independent w/  psychosocial skills in order to reengage in community re-entry w/ independence upon DC from this OT PHP program.    Time 4    Period Weeks    Status New    Target Date 02/03/22      OT SHORT TERM GOAL #2   Title pt to demonstrate independence w/ goal creation and routine building skills to improve b/iADLs and community re-entry at time of discharge from this PHP OT program.    Time 4    Period Weeks    Status New    Target Date 02/03/22      OT SHORT TERM GOAL #3   Title pt to demonstrate independence in time mgmt skills to improve b/i ADLs w/ success at the time of her discharge from this OT PHP program    Time 4    Period Weeks    Status New    Target Date 02/03/22                      Plan - 01/22/22 1336     Psychosocial Skills Habits;Routines and Behaviors;Interpersonal Interaction;Coping Strategies             Patient will benefit from skilled therapeutic intervention in order to improve  the following deficits and impairments:       Psychosocial Skills: Habits, Routines and Behaviors, Interpersonal Interaction, Coping Strategies   Visit Diagnosis: Difficulty coping    Problem List Patient Active Problem List   Diagnosis Date Noted   Vaginal discharge 11/24/2021   Gastroenteritis 06/13/2021   Bronchitis 04/18/2021   Seizure disorder ([place]) 10/25/2020   Thrush 10/25/2020   Amenorrhea 10/06/2020   Ear pain, left 10/06/2020   Schizo-affective schizophrenia ([place])    Allergy    ADHD (attention deficit hyperactivity disorder)    Depression    Bipolar I disorder ([place]) 05/12/2020   Benzodiazepine dependence ([place]) 05/12/2020   Morbid obesity ([place]) 09/06/2019   Possible pregnancy 09/06/2019   Delirium tremens ([place]) 05/20/2019   Seizure ([place]) 05/20/2019   Acute vaginitis 05/20/2019   High risk sexual behavior 05/20/2019   Vitamin D deficiency 03/10/2019   Constipation 04/03/2018   Schizoaffective disorder ([place]) 03/08/2018   Bipolar disorder,  current episode manic severe with psychotic features ([place])    MDD (major depressive disorder), recurrent severe, without psychosis ([place]) 02/11/2018   Pelvic pain 01/31/2018   Genital herpes simplex 10/24/2017   Anxiety 08/30/2017   Severe bipolar I disorder, current or most recent episode depressed ([place]) 03/30/2017   Syncope 03/03/2017   STD exposure 05/13/2015   Eczema 12/11/2013   Encounter for contraceptive management 03/16/2013   Bipolar disorder ([place]) 12/30/2012   ADD (attention deficit disorder) 04/24/2011    Brantley Stage, OT 01/22/2022, 1:37 PM  Cornell Barman, OT   [facility]PITALIZ[facility] [place], [place], [address] Phone: [phone]   Fax:  [phone]  Name: [name] MRN: [id number] Date of Birth: 10/22/1995

## 2022-01-23 ENCOUNTER — Encounter (HOSPITAL_COMMUNITY): Payer: Self-pay

## 2022-01-23 ENCOUNTER — Other Ambulatory Visit (HOSPITAL_COMMUNITY): Payer: Medicare Other

## 2022-01-23 ENCOUNTER — Other Ambulatory Visit (HOSPITAL_COMMUNITY): Payer: Medicare Other | Admitting: Licensed Clinical Social Worker

## 2022-01-23 DIAGNOSIS — F250 Schizoaffective disorder, bipolar type: Secondary | ICD-10-CM | POA: Diagnosis not present

## 2022-01-23 DIAGNOSIS — R4589 Other symptoms and signs involving emotional state: Secondary | ICD-10-CM

## 2022-01-23 NOTE — Therapy (Signed)
[place], [place], [address] Phone: [phone]   Fax:  [phone]  Occupational Therapy Treatment Virtual Visit via Video Note  I connected with [name] on 01/23/22 at  8:00 AM EDT by a video enabled telemedicine application and verified that I am speaking with the correct person using two identifiers.  Location: Patient: home Provider: office   I discussed the limitations of evaluation and management by telemedicine and the availability of in person appointments. The patient expressed understanding and agreed to proceed.    The patient was advised to call back or seek an in-person evaluation if the symptoms worsen or if the condition fails to improve as anticipated.  I provided 55 minutes of non-face-to-face time during this encounter.   Patient Details  Name: [name] MRN: [id number] Date of Birth: 11-Oct-1995 No data recorded  Encounter Date: 01/23/2022   OT End of Session - 01/23/22 1341     Visit Number 9    Number of Visits 20    Date for OT Re-Evaluation 02/03/22    Authorization Type MEDICARE PART A AND B    Authorization Time Period Ind Deduct MET  Fam Deduct MET   Ind OOP 3000-1242.21 met  FAM OOP 3790-2409.73 met    OT Start Time 1200    OT Stop Time 1255    OT Time Calculation (min) 55 min             Past Medical History:  Diagnosis Date   Acute vaginitis 05/20/2019   ADD (attention deficit disorder) 04/24/2011   ADHD (attention deficit hyperactivity disorder)    Allergy    Anxiety    Benzodiazepine dependence ([place]) 05/12/2020   Bipolar disorder ([place])    Bipolar disorder, current episode manic severe with psychotic features ([place])    Bipolar I disorder ([place]) 05/12/2020   Constipation 04/03/2018   Delirium tremens ([place]) 05/20/2019   Depression    Eczema 12/11/13   Genital herpes simplex 10/24/2017   High risk heterosexual behavior 05/20/2019   MDD (major  depressive disorder), recurrent severe, without psychosis ([place]) 02/11/2018   Morbid obesity ([place]) 09/06/2019   Pelvic pain 01/31/2018   Possible pregnancy 09/06/2019   Preventative health care 03/16/2013   Schizo-affective schizophrenia ([place])    Schizoaffective disorder ([place]) 03/08/2018   Seizure ([place]) 05/20/2019   Severe bipolar I disorder, current or most recent episode depressed ([place]) 03/30/2017   STD exposure 05/13/2015   Syncope 03/03/2017   Vitamin D deficiency 03/10/2019    History reviewed. No pertinent surgical history.  There were no vitals filed for this visit.   Subjective Assessment - 01/23/22 1340     Currently in Pain? No/denies    Pain Score 0-No pain             Group Session:  S: Feeling better today / working on my time boundaries skills   O: During the group session conducted via telehealth, participants engaged in a comprehensive discussion on the topic of boundaries, specifically focusing on adults who suffer from depression, anxiety, and other mental health problems. The session began with an introduction to the concept of boundaries and their importance in maintaining emotional well-being. The facilitator explained that boundaries are guidelines that individuals establish to protect themselves from emotional, physical, and time-related stressors.  The group explored the three main types of boundaries: physical, emotional, and time sensitive. Physical boundaries refer to personal space and the physical limits  individuals set for themselves. Emotional boundaries involve recognizing and respecting one's own feelings and those of others, ensuring that emotions are not disregarded or invalidated. Time-sensitive boundaries revolve around managing and allocating time effectively to avoid feeling overwhelmed or stretched too thin.  The participants also discussed the benefits of setting boundaries and how to assert them in different situations. They learned that boundaries  could enhance self-esteem, reduce stress, improve relationships, and promote self-care. Techniques for asserting boundaries were shared, such as clear communication, assertiveness, and practicing self-awareness. The overall objective of the session was to equip participants with the knowledge and skills to set and maintain healthy boundaries, ultimately enhancing their emotional well-being and daily functioning in various areas of life.   A: During the session, Patient actively participated in the discussion on boundaries, demonstrating a high level of engagement. They displayed a strong understanding of the concept and effectively contributed to the group's exploration of physical, emotional, and time-sensitive boundaries. Patient actively shared personal experiences related to setting boundaries and expressed a willingness to learn and implement new strategies. They demonstrated excellent comprehension of the benefits of establishing boundaries and showed enthusiasm in acquiring the skills needed to assert boundaries effectively. Patient actively listened to others' perspectives, asked insightful questions, and offered constructive feedback to fellow group members. Overall, Patient's engagement in the session showcased a proactive approach towards improving their emotional well-being through the practice of healthy boundaries.  P: Continue to attend PHP OT group sessions 5x week for 4 weeks to promote daily structure, social engagement, and opportunities to develop and utilize adaptive strategies to maximize functional performance in preparation for safe transition and integration back into school, work, and the community. Plan to address topic of Boundaries for Improved Activities of Daily Living 2/2 in next OT group session.                      OT Education - 01/23/22 1340     Education Details Boundaries for Improved Activities of Daily Living 1/2              OT Short Term  Goals - 01/04/22 2101       OT SHORT TERM GOAL #1   Title Pt will be independent w/ psychosocial skills in order to reengage in community re-entry w/ independence upon DC from this OT PHP program.    Time 4    Period Weeks    Status New    Target Date 02/03/22      OT SHORT TERM GOAL #2   Title pt to demonstrate independence w/ goal creation and routine building skills to improve b/iADLs and community re-entry at time of discharge from this PHP OT program.    Time 4    Period Weeks    Status New    Target Date 02/03/22      OT SHORT TERM GOAL #3   Title pt to demonstrate independence in time mgmt skills to improve b/i ADLs w/ success at the time of her discharge from this OT PHP program    Time 4    Period Weeks    Status New    Target Date 02/03/22                      Plan - 01/23/22 1341     Psychosocial Skills Habits;Routines and Behaviors;Interpersonal Interaction;Coping Strategies             Patient will benefit from skilled therapeutic intervention  in order to improve the following deficits and impairments:       Psychosocial Skills: Habits, Routines and Behaviors, Interpersonal Interaction, Coping Strategies   Visit Diagnosis: Difficulty coping    Problem List Patient Active Problem List   Diagnosis Date Noted   Vaginal discharge 11/24/2021   Gastroenteritis 06/13/2021   Bronchitis 04/18/2021   Seizure disorder ([place]) 10/25/2020   Thrush 10/25/2020   Amenorrhea 10/06/2020   Ear pain, left 10/06/2020   Schizo-affective schizophrenia ([place])    Allergy    ADHD (attention deficit hyperactivity disorder)    Depression    Bipolar I disorder ([place]) 05/12/2020   Benzodiazepine dependence ([place]) 05/12/2020   Morbid obesity ([place]) 09/06/2019   Possible pregnancy 09/06/2019   Delirium tremens ([place]) 05/20/2019   Seizure ([place]) 05/20/2019   Acute vaginitis 05/20/2019   High risk sexual behavior 05/20/2019   Vitamin D deficiency 03/10/2019    Constipation 04/03/2018   Schizoaffective disorder ([place]) 03/08/2018   Bipolar disorder, current episode manic severe with psychotic features ([place])    MDD (major depressive disorder), recurrent severe, without psychosis ([place]) 02/11/2018   Pelvic pain 01/31/2018   Genital herpes simplex 10/24/2017   Anxiety 08/30/2017   Severe bipolar I disorder, current or most recent episode depressed ([place]) 03/30/2017   Syncope 03/03/2017   STD exposure 05/13/2015   Eczema 12/11/2013   Encounter for contraceptive management 03/16/2013   Bipolar disorder ([place]) 12/30/2012   ADD (attention deficit disorder) 04/24/2011    Brantley Stage, OT 01/23/2022, 1:41 PM Cornell Barman, [place], [place], [address] Phone: [phone]   Fax:  [phone]  Name: [name] MRN: [id number] Date of Birth: 30-Mar-1996

## 2022-01-24 ENCOUNTER — Other Ambulatory Visit (HOSPITAL_COMMUNITY): Payer: Medicare Other

## 2022-01-24 ENCOUNTER — Ambulatory Visit (HOSPITAL_COMMUNITY): Payer: Medicare Other

## 2022-01-25 ENCOUNTER — Other Ambulatory Visit (HOSPITAL_COMMUNITY): Payer: Medicare Other | Admitting: Licensed Clinical Social Worker

## 2022-01-25 ENCOUNTER — Encounter (HOSPITAL_COMMUNITY): Payer: Self-pay

## 2022-01-25 ENCOUNTER — Other Ambulatory Visit (HOSPITAL_COMMUNITY): Payer: Medicare Other

## 2022-01-25 DIAGNOSIS — F250 Schizoaffective disorder, bipolar type: Secondary | ICD-10-CM | POA: Diagnosis not present

## 2022-01-25 DIAGNOSIS — R4589 Other symptoms and signs involving emotional state: Secondary | ICD-10-CM

## 2022-01-25 NOTE — Therapy (Signed)
[place], [place], [address] Phone: [phone]   Fax:  [phone]  Occupational Therapy Treatment Virtual Visit via Video Note  I connected with CIDNEY KIRKWOOD on 01/25/22 at  8:00 AM EDT by a video enabled telemedicine application and verified that I am speaking with the correct person using two identifiers.  Location: Patient: home Provider: office   I discussed the limitations of evaluation and management by telemedicine and the availability of in person appointments. The patient expressed understanding and agreed to proceed.    The patient was advised to call back or seek an in-person evaluation if the symptoms worsen or if the condition fails to improve as anticipated.  I provided 60 minutes of non-face-to-face time during this encounter.   Patient Details  Name: [name] MRN: [id number] Date of Birth: 06-Nov-1995 No data recorded  Encounter Date: 01/25/2022   OT End of Session - 01/25/22 1247     Visit Number 10    Number of Visits 20    Date for OT Re-Evaluation 02/03/22    Authorization Type MEDICARE PART A AND B    Authorization Time Period Ind Deduct MET  Fam Deduct MET   Ind OOP 3000-1242.21 met  FAM OOP [phone] met    OT Start Time 79    OT Stop Time 1200    OT Time Calculation (min) 60 min             Past Medical History:  Diagnosis Date   Acute vaginitis 05/20/2019   ADD (attention deficit disorder) 04/24/2011   ADHD (attention deficit hyperactivity disorder)    Allergy    Anxiety    Benzodiazepine dependence ([place]) 05/12/2020   Bipolar disorder ([place])    Bipolar disorder, current episode manic severe with psychotic features ([place])    Bipolar I disorder ([place]) 05/12/2020   Constipation 04/03/2018   Delirium tremens ([place]) 05/20/2019   Depression    Eczema 12/11/13   Genital herpes simplex 10/24/2017   High risk heterosexual behavior 05/20/2019   MDD  (major depressive disorder), recurrent severe, without psychosis ([place]) 02/11/2018   Morbid obesity ([place]) 09/06/2019   Pelvic pain 01/31/2018   Possible pregnancy 09/06/2019   Preventative health care 03/16/2013   Schizo-affective schizophrenia ([place])    Schizoaffective disorder ([place]) 03/08/2018   Seizure ([place]) 05/20/2019   Severe bipolar I disorder, current or most recent episode depressed ([place]) 03/30/2017   STD exposure 05/13/2015   Syncope 03/03/2017   Vitamin D deficiency 03/10/2019    History reviewed. No pertinent surgical history.  There were no vitals filed for this visit.   Subjective Assessment - 01/25/22 1247     Currently in Pain? No/denies    Pain Score 0-No pain               Group Session:  S: "I'm feeling a little better today"  O: The patient, a 26 year old female diagnosed with major depression, BPD, ADD, Schizo-Affective, ADHD reported during the telehealth session struggling to perform basic self-care tasks, including getting out of bed in the morning. The occupational therapist provided education on the impact of depression on self-care and discussed ten specific strategies to improve self-care. The therapist explained how using a combination of proprioception and gravity can help the patient to move and overcome difficulties with getting out of bed in the morning. The therapist demonstrated how placing one leg off the bed while in supine can provide proprioception feedback  and promote movement to further facilitate movement to the edge of the bed and ultimately sitting up in bed. The therapist discussed the importance of gradual exposure, starting with small movements and gradually increasing the intensity of the movements, to help the patient overcome their difficulties. The therapist also discussed the use of other strategies, such as using a morning routine, breaking down tasks into smaller steps, and positive self-talk, to increase motivation and make the task of getting  out of bed more manageable.  A: The patient was overall passively active and engaged today, however continues to demonstrated a fair understanding of the specific strategies discussed to improve self-care, including the use of prioritizing, operating w/in the limitations in which you find yourself in for any given day and even the concept proprioception and gravity technique, and expressed interest in implementing them. The occupational therapist will continue to assess progress in implementing these strategies and provide additional support as needed in future telehealth sessions. The therapist will also continue to address the impact of depression on self-care and explore any additional barriers that may be hindering the patient's ability to engage in self-care activities, including getting out of bed in the morning.  P: Continue to attend PHP OT group sessions 5x week for 4 weeks to promote daily structure, social engagement, and opportunities to develop and utilize adaptive strategies to maximize functional performance in preparation for safe transition and integration back into school, work, and the community. Plan to address topic of Self Care and Depression 2/2 in next OT group session.                    OT Education - 01/25/22 1247     Education Details Self Care and Depression 1/2              OT Short Term Goals - 01/04/22 2101       OT SHORT TERM GOAL #1   Title Pt will be independent w/ psychosocial skills in order to reengage in community re-entry w/ independence upon DC from this OT PHP program.    Time 4    Period Weeks    Status New    Target Date 02/03/22      OT SHORT TERM GOAL #2   Title pt to demonstrate independence w/ goal creation and routine building skills to improve b/iADLs and community re-entry at time of discharge from this PHP OT program.    Time 4    Period Weeks    Status New    Target Date 02/03/22      OT SHORT TERM GOAL #3   Title  pt to demonstrate independence in time mgmt skills to improve b/i ADLs w/ success at the time of her discharge from this OT PHP program    Time 4    Period Weeks    Status New    Target Date 02/03/22                      Plan - 01/25/22 1247     Psychosocial Skills Habits;Routines and Behaviors;Interpersonal Interaction;Coping Strategies             Patient will benefit from skilled therapeutic intervention in order to improve the following deficits and impairments:       Psychosocial Skills: Habits, Routines and Behaviors, Interpersonal Interaction, Coping Strategies   Visit Diagnosis: Difficulty coping    Problem List Patient Active Problem List   Diagnosis Date Noted  Vaginal discharge 11/24/2021   Gastroenteritis 06/13/2021   Bronchitis 04/18/2021   Seizure disorder ([place]) 10/25/2020   Thrush 10/25/2020   Amenorrhea 10/06/2020   Ear pain, left 10/06/2020   Schizo-affective schizophrenia ([place])    Allergy    ADHD (attention deficit hyperactivity disorder)    Depression    Bipolar I disorder ([place]) 05/12/2020   Benzodiazepine dependence ([place]) 05/12/2020   Morbid obesity ([place]) 09/06/2019   Possible pregnancy 09/06/2019   Delirium tremens ([place]) 05/20/2019   Seizure ([place]) 05/20/2019   Acute vaginitis 05/20/2019   High risk sexual behavior 05/20/2019   Vitamin D deficiency 03/10/2019   Constipation 04/03/2018   Schizoaffective disorder ([place]) 03/08/2018   Bipolar disorder, current episode manic severe with psychotic features ([place])    MDD (major depressive disorder), recurrent severe, without psychosis ([place]) 02/11/2018   Pelvic pain 01/31/2018   Genital herpes simplex 10/24/2017   Anxiety 08/30/2017   Severe bipolar I disorder, current or most recent episode depressed ([place]) 03/30/2017   Syncope 03/03/2017   STD exposure 05/13/2015   Eczema 12/11/2013   Encounter for contraceptive management 03/16/2013   Bipolar disorder ([place]) 12/30/2012   ADD  (attention deficit disorder) 04/24/2011    Brantley Stage, OT 01/25/2022, 12:48 PM  Cornell Barman, OT   [facility] HOSPITALIZATION PROGRAM [place], [place], [address] Phone: [phone]   Fax:  [phone]  Name: ELAYNAH VIRGINIA MRN: [id number] Date of Birth: [birth year]

## 2022-01-26 ENCOUNTER — Other Ambulatory Visit (HOSPITAL_COMMUNITY): Payer: Medicare Other | Admitting: Licensed Clinical Social Worker

## 2022-01-26 ENCOUNTER — Encounter (HOSPITAL_COMMUNITY): Payer: Self-pay

## 2022-01-26 ENCOUNTER — Telehealth (HOSPITAL_COMMUNITY): Payer: Self-pay | Admitting: Psychiatry

## 2022-01-26 ENCOUNTER — Other Ambulatory Visit (HOSPITAL_COMMUNITY): Payer: Medicare Other

## 2022-01-26 DIAGNOSIS — F250 Schizoaffective disorder, bipolar type: Secondary | ICD-10-CM | POA: Diagnosis not present

## 2022-01-26 DIAGNOSIS — R4589 Other symptoms and signs involving emotional state: Secondary | ICD-10-CM

## 2022-01-26 NOTE — Therapy (Signed)
[place], [place], [address] Phone: [phone]   Fax:  [phone]  Occupational Therapy Treatment Virtual Visit via Video Note  I connected with [name] on 01/26/22 at  8:00 AM EDT by a video enabled telemedicine application and verified that I am speaking with the correct person using two identifiers.  Location: Patient: home Provider: office   I discussed the limitations of evaluation and management by telemedicine and the availability of in person appointments. The patient expressed understanding and agreed to proceed.    The patient was advised to call back or seek an in-person evaluation if the symptoms worsen or if the condition fails to improve as anticipated.  I provided 60 minutes of non-face-to-face time during this encounter.   Patient Details  Name: [name] MRN: [id number] Date of Birth: 1996-01-17 No data recorded  Encounter Date: 01/26/2022   OT End of Session - 01/26/22 1242     Visit Number 11    Number of Visits 20    Date for OT Re-Evaluation 02/03/22    Authorization Type MEDICARE PART A AND B    Authorization Time Period Ind Deduct MET  Fam Deduct MET   Ind OOP [phone] met  FAM OOP [phone] met    OT Start Time 87    OT Stop Time 1200    OT Time Calculation (min) 60 min             Past Medical History:  Diagnosis Date   Acute vaginitis 05/20/2019   ADD (attention deficit disorder) 04/24/2011   ADHD (attention deficit hyperactivity disorder)    Allergy    Anxiety    Benzodiazepine dependence ([place]) 05/12/2020   Bipolar disorder ([place])    Bipolar disorder, current episode manic severe with psychotic features ([place])    Bipolar I disorder ([place]) 05/12/2020   Constipation 04/03/2018   Delirium tremens ([place]) 05/20/2019   Depression    Eczema 12/11/13   Genital herpes simplex 10/24/2017   High risk heterosexual behavior 05/20/2019   MDD  (major depressive disorder), recurrent severe, without psychosis ([employer]) 02/11/2018   Morbid obesity ([place]) 09/06/2019   Pelvic pain 01/31/2018   Possible pregnancy 09/06/2019   Preventative health care 03/16/2013   Schizo-affective schizophrenia ([place])    Schizoaffective disorder ([place]) 03/08/2018   Seizure ([place]) 05/20/2019   Severe bipolar I disorder, current or most recent episode depressed ([place]) 03/30/2017   STD exposure 05/13/2015   Syncope 03/03/2017   Vitamin D deficiency 03/10/2019    History reviewed. No pertinent surgical history.  There were no vitals filed for this visit.   Subjective Assessment - 01/26/22 1241     Currently in Pain? No/denies    Pain Score 0-No pain               Group Session:  S: Feeling much better and I look forward to using the skills I have learned in these groups to improve my life overall.   O: The patient, a 26 year old female diagnosed with major depression, reported during the telehealth session struggling to perform basic self-care tasks, including getting out of bed in the morning. The occupational therapist provided education on the impact of depression on self-care and discussed ten specific strategies to improve self-care. The therapist explained how using a combination of proprioception and gravity can help the patient to move and overcome difficulties with getting out of bed in the morning. The therapist demonstrated how  placing one leg off the bed while in supine can provide proprioception feedback and promote movement to further facilitate movement to the edge of the bed and ultimately sitting up in bed. The therapist discussed the importance of gradual exposure, starting with small movements and gradually increasing the intensity of the movements, to help the patient overcome their difficulties. The therapist also discussed the use of other strategies, such as using a morning routine, breaking down tasks into smaller steps, and positive self-talk,  to increase motivation and make the task of getting out of bed more manageable.  A: The patient demonstrated a fair understanding of the specific strategies discussed to improve self-care, including the use of prioritizing, operating w/in the limitations in which you find yourself in for any given day and even the concept proprioception and gravity technique, and expressed interest in implementing them. The occupational therapist will continue to assess progress in implementing these strategies and provide additional support as needed in future telehealth sessions. The therapist will also continue to address the impact of depression on self-care and explore any additional barriers that may be hindering the patient's ability to engage in self-care activities, including getting out of bed in the morning.  P: Pt has met all goals and is ready for discharge at this time.     OCCUPATIONAL THERAPY DISCHARGE SUMMARY  Visits from Start of Care: 11  Current functional level related to goals / functional outcomes: All goals met and pt is ready for DC at this time.    Remaining deficits: NA   Education / Equipment: NA  Plan: Patient agrees to discharge.                    OT Education - 01/26/22 1241     Education Details Self Care and Depression 2/2              OT Short Term Goals - 01/04/22 2101       OT SHORT TERM GOAL #1   Title Pt will be independent w/ psychosocial skills in order to reengage in community re-entry w/ independence upon DC from this OT PHP program.    Time 4    Period Weeks    Status met   Target Date 02/03/22      OT SHORT TERM GOAL #2   Title pt to demonstrate independence w/ goal creation and routine building skills to improve b/iADLs and community re-entry at time of discharge from this PHP OT program.    Time 4    Period Weeks    Status met   Target Date 02/03/22      OT SHORT TERM GOAL #3   Title pt to demonstrate independence in time  mgmt skills to improve b/i ADLs w/ success at the time of her discharge from this OT PHP program    Time 4    Period Weeks    Status met   Target Date 02/03/22                      Plan - 01/26/22 1242     Occupational performance deficits (Please refer to evaluation for details): IADL's;Rest and Sleep;Education;Work;Leisure;Social Participation    Psychosocial Skills Habits;Routines and Behaviors;Interpersonal Interaction;Coping Strategies             Patient will benefit from skilled therapeutic intervention in order to improve the following deficits and impairments:       Psychosocial Skills: Habits, Routines and Behaviors, Interpersonal  Interaction, Coping Strategies   Visit Diagnosis: Difficulty coping    Problem List Patient Active Problem List   Diagnosis Date Noted   Vaginal discharge 11/24/2021   Gastroenteritis 06/13/2021   Bronchitis 04/18/2021   Seizure disorder ([place]) 10/25/2020   Thrush 10/25/2020   Amenorrhea 10/06/2020   Ear pain, left 10/06/2020   Schizo-affective schizophrenia ([place])    Allergy    ADHD (attention deficit hyperactivity disorder)    Depression    Bipolar I disorder ([place]) 05/12/2020   Benzodiazepine dependence ([place]) 05/12/2020   Morbid obesity ([place]) 09/06/2019   Possible pregnancy 09/06/2019   Delirium tremens ([place]) 05/20/2019   Seizure ([place]) 05/20/2019   Acute vaginitis 05/20/2019   High risk sexual behavior 05/20/2019   Vitamin D deficiency 03/10/2019   Constipation 04/03/2018   Schizoaffective disorder ([place]) 03/08/2018   Bipolar disorder, current episode manic severe with psychotic features ([employer])    MDD (major depressive disorder), recurrent severe, without psychosis ([place]) 02/11/2018   Pelvic pain 01/31/2018   Genital herpes simplex 10/24/2017   Anxiety 08/30/2017   Severe bipolar I disorder, current or most recent episode depressed ([place]) 03/30/2017   Syncope 03/03/2017   STD exposure 05/13/2015   Eczema  12/11/2013   Encounter for contraceptive management 03/16/2013   Bipolar disorder ([place]) 12/30/2012   ADD (attention deficit disorder) 04/24/2011    Brantley Stage, OT 01/26/2022, 12:43 PM Cornell Barman, OT   [facility] HOSPITALIZATION PROGRAM [place], [place], [address] Phone: [phone]   Fax:  [phone]  Name: [name] MRN: [id number] Date of Birth: 10/29/1995

## 2022-01-29 ENCOUNTER — Telehealth (HOSPITAL_COMMUNITY): Payer: Self-pay | Admitting: Psychiatry

## 2022-01-29 ENCOUNTER — Ambulatory Visit (HOSPITAL_COMMUNITY): Payer: Medicare Other | Admitting: Psychiatry

## 2022-01-29 ENCOUNTER — Ambulatory Visit (HOSPITAL_COMMUNITY): Payer: Medicare Other

## 2022-01-29 ENCOUNTER — Other Ambulatory Visit (HOSPITAL_COMMUNITY): Payer: Medicare Other

## 2022-01-30 ENCOUNTER — Ambulatory Visit (HOSPITAL_COMMUNITY): Payer: Medicare Other

## 2022-01-30 ENCOUNTER — Other Ambulatory Visit (HOSPITAL_COMMUNITY): Payer: Medicare Other

## 2022-01-30 ENCOUNTER — Ambulatory Visit (HOSPITAL_COMMUNITY): Payer: Medicare Other | Admitting: Psychiatry

## 2022-01-30 ENCOUNTER — Telehealth (HOSPITAL_COMMUNITY): Payer: Self-pay | Admitting: Psychiatry

## 2022-01-30 NOTE — Telephone Encounter (Signed)
D:  Patient didn't show for group again today.  A:  Placed another call to pt, pt's mother answered stating pt never got a group link to join.  Requested that Ms. Bouler tell the patient to check her junk/spam email.  Will resend link for tomorrow.  Inform treatment team.

## 2022-01-31 ENCOUNTER — Other Ambulatory Visit (HOSPITAL_COMMUNITY): Payer: Medicare Other

## 2022-01-31 ENCOUNTER — Ambulatory Visit (HOSPITAL_COMMUNITY): Payer: Medicare Other

## 2022-01-31 ENCOUNTER — Ambulatory Visit (HOSPITAL_COMMUNITY): Payer: Medicare Other | Admitting: Psychiatry

## 2022-02-01 ENCOUNTER — Other Ambulatory Visit (HOSPITAL_COMMUNITY): Payer: Medicare Other

## 2022-02-01 ENCOUNTER — Ambulatory Visit (HOSPITAL_COMMUNITY): Payer: Medicare Other

## 2022-02-02 ENCOUNTER — Ambulatory Visit (HOSPITAL_COMMUNITY): Payer: Medicare Other

## 2022-02-02 ENCOUNTER — Other Ambulatory Visit (HOSPITAL_COMMUNITY): Payer: Medicare Other

## 2022-02-03 ENCOUNTER — Other Ambulatory Visit (HOSPITAL_COMMUNITY): Payer: Self-pay

## 2022-02-03 ENCOUNTER — Other Ambulatory Visit: Payer: Self-pay | Admitting: Family Medicine

## 2022-02-03 DIAGNOSIS — Z3009 Encounter for other general counseling and advice on contraception: Secondary | ICD-10-CM

## 2022-02-05 ENCOUNTER — Other Ambulatory Visit (HOSPITAL_COMMUNITY): Payer: Self-pay

## 2022-02-05 ENCOUNTER — Ambulatory Visit (HOSPITAL_COMMUNITY): Payer: Medicare Other

## 2022-02-05 MED ORDER — LORAZEPAM 1 MG PO TABS
1.0000 mg | ORAL_TABLET | Freq: Four times a day (QID) | ORAL | 0 refills | Status: DC
  Filled 2022-02-05: qty 120, 30d supply, fill #0
  Filled 2022-04-07: qty 120, 30d supply, fill #1
  Filled 2022-06-04: qty 120, 30d supply, fill #2

## 2022-02-05 MED ORDER — NORGESTIMATE-ETH ESTRADIOL 0.25-35 MG-MCG PO TABS
1.0000 | ORAL_TABLET | Freq: Every day | ORAL | 1 refills | Status: DC
  Filled 2022-02-05: qty 84, 84d supply, fill #0
  Filled 2022-04-07 – 2022-05-01 (×2): qty 84, 84d supply, fill #1

## 2022-02-06 ENCOUNTER — Other Ambulatory Visit (HOSPITAL_COMMUNITY): Payer: Self-pay

## 2022-02-06 ENCOUNTER — Ambulatory Visit (HOSPITAL_COMMUNITY): Payer: Medicare Other

## 2022-02-07 ENCOUNTER — Telehealth: Payer: Self-pay

## 2022-02-07 ENCOUNTER — Ambulatory Visit (HOSPITAL_COMMUNITY): Payer: Medicare Other

## 2022-02-07 ENCOUNTER — Other Ambulatory Visit (HOSPITAL_BASED_OUTPATIENT_CLINIC_OR_DEPARTMENT_OTHER): Payer: Self-pay

## 2022-02-07 ENCOUNTER — Other Ambulatory Visit: Payer: Self-pay

## 2022-02-07 ENCOUNTER — Other Ambulatory Visit (HOSPITAL_COMMUNITY): Payer: Self-pay

## 2022-02-07 MED ORDER — LEVOCETIRIZINE DIHYDROCHLORIDE 5 MG PO TABS
5.0000 mg | ORAL_TABLET | Freq: Every evening | ORAL | 1 refills | Status: DC
  Filled 2022-02-07 (×2): qty 30, 30d supply, fill #0
  Filled 2022-03-20: qty 30, 30d supply, fill #1

## 2022-02-07 MED ORDER — QUETIAPINE FUMARATE 100 MG PO TABS
100.0000 mg | ORAL_TABLET | Freq: Every day | ORAL | 0 refills | Status: DC
  Filled 2022-02-07: qty 30, 30d supply, fill #0

## 2022-02-07 NOTE — Telephone Encounter (Signed)
Pt called in wanted new allergy medication due to zyrtec not working.

## 2022-02-07 NOTE — Telephone Encounter (Signed)
Stop Zyrtec, try zyxal .  Prescription sent downstairs

## 2022-02-08 ENCOUNTER — Ambulatory Visit (HOSPITAL_COMMUNITY): Payer: Medicare Other

## 2022-02-16 ENCOUNTER — Other Ambulatory Visit (HOSPITAL_COMMUNITY): Payer: Self-pay

## 2022-02-19 ENCOUNTER — Telehealth: Payer: Self-pay | Admitting: Cardiology

## 2022-02-19 NOTE — Telephone Encounter (Signed)
*  STAT* If patient is at the pharmacy, call can be transferred to refill team.   1. Which medications need to be refilled? (please list name of each medication and dose if known) propranolol (INDERAL) 10 MG tablet  2. Which pharmacy/location (including street and city if local pharmacy) is medication to be sent to?propranolol (INDERAL) 10 MG tablet  3. Do they need a 30 day or 90 day supply? 45

## 2022-02-21 ENCOUNTER — Other Ambulatory Visit (HOSPITAL_BASED_OUTPATIENT_CLINIC_OR_DEPARTMENT_OTHER): Payer: Self-pay

## 2022-02-21 MED ORDER — PROPRANOLOL HCL 10 MG PO TABS
5.0000 mg | ORAL_TABLET | Freq: Every day | ORAL | 3 refills | Status: DC
  Filled 2022-02-21 – 2022-02-28 (×2): qty 45, 90d supply, fill #0
  Filled 2022-05-29: qty 45, 90d supply, fill #1
  Filled 2022-08-20 – 2022-08-30 (×2): qty 45, 90d supply, fill #2
  Filled 2022-11-19: qty 45, 90d supply, fill #3

## 2022-02-23 ENCOUNTER — Other Ambulatory Visit (HOSPITAL_COMMUNITY): Payer: Self-pay

## 2022-02-24 ENCOUNTER — Other Ambulatory Visit (HOSPITAL_COMMUNITY): Payer: Self-pay

## 2022-02-28 ENCOUNTER — Other Ambulatory Visit (HOSPITAL_BASED_OUTPATIENT_CLINIC_OR_DEPARTMENT_OTHER): Payer: Self-pay

## 2022-03-02 ENCOUNTER — Other Ambulatory Visit (HOSPITAL_COMMUNITY): Payer: Self-pay

## 2022-03-02 MED ORDER — BUPROPION HCL ER (XL) 150 MG PO TB24
ORAL_TABLET | ORAL | 4 refills | Status: DC
  Filled 2022-05-21: qty 90, 90d supply, fill #0
  Filled 2022-08-02 – 2022-08-04 (×2): qty 90, 90d supply, fill #1
  Filled 2022-11-16: qty 16, 16d supply, fill #2
  Filled 2022-11-17: qty 74, 74d supply, fill #2
  Filled 2023-02-08: qty 90, 90d supply, fill #3

## 2022-03-02 MED ORDER — QUETIAPINE FUMARATE 100 MG PO TABS
100.0000 mg | ORAL_TABLET | Freq: Every day | ORAL | 4 refills | Status: DC
  Filled 2022-03-02: qty 90, 90d supply, fill #0
  Filled 2022-06-05: qty 90, 90d supply, fill #1

## 2022-03-05 ENCOUNTER — Other Ambulatory Visit (HOSPITAL_COMMUNITY): Payer: Self-pay

## 2022-03-07 ENCOUNTER — Other Ambulatory Visit (HOSPITAL_COMMUNITY): Payer: Self-pay

## 2022-03-07 ENCOUNTER — Other Ambulatory Visit: Payer: Self-pay | Admitting: Family Medicine

## 2022-03-07 DIAGNOSIS — J302 Other seasonal allergic rhinitis: Secondary | ICD-10-CM

## 2022-03-08 ENCOUNTER — Other Ambulatory Visit (HOSPITAL_COMMUNITY): Payer: Self-pay

## 2022-03-20 ENCOUNTER — Other Ambulatory Visit (HOSPITAL_COMMUNITY): Payer: Self-pay

## 2022-03-21 ENCOUNTER — Encounter (INDEPENDENT_AMBULATORY_CARE_PROVIDER_SITE_OTHER): Payer: Self-pay

## 2022-03-23 ENCOUNTER — Other Ambulatory Visit: Payer: Self-pay | Admitting: Family Medicine

## 2022-03-23 ENCOUNTER — Other Ambulatory Visit (HOSPITAL_COMMUNITY): Payer: Self-pay

## 2022-03-23 DIAGNOSIS — J302 Other seasonal allergic rhinitis: Secondary | ICD-10-CM

## 2022-03-26 MED ORDER — FLUTICASONE PROPIONATE 50 MCG/ACT NA SUSP
NASAL | 1 refills | Status: DC
  Filled 2022-03-26: qty 16, 30d supply, fill #0
  Filled 2022-05-29: qty 16, 30d supply, fill #1

## 2022-03-27 ENCOUNTER — Other Ambulatory Visit (HOSPITAL_COMMUNITY): Payer: Self-pay

## 2022-04-05 ENCOUNTER — Other Ambulatory Visit (HOSPITAL_BASED_OUTPATIENT_CLINIC_OR_DEPARTMENT_OTHER): Payer: Self-pay

## 2022-04-06 ENCOUNTER — Other Ambulatory Visit (HOSPITAL_COMMUNITY): Payer: Self-pay

## 2022-04-07 ENCOUNTER — Other Ambulatory Visit (HOSPITAL_COMMUNITY): Payer: Self-pay

## 2022-04-09 ENCOUNTER — Other Ambulatory Visit (HOSPITAL_COMMUNITY): Payer: Self-pay

## 2022-04-21 ENCOUNTER — Other Ambulatory Visit (HOSPITAL_COMMUNITY): Payer: Self-pay

## 2022-04-21 ENCOUNTER — Other Ambulatory Visit: Payer: Self-pay | Admitting: Family Medicine

## 2022-04-23 ENCOUNTER — Other Ambulatory Visit (HOSPITAL_COMMUNITY): Payer: Self-pay

## 2022-04-23 MED ORDER — LEVOCETIRIZINE DIHYDROCHLORIDE 5 MG PO TABS
5.0000 mg | ORAL_TABLET | Freq: Every evening | ORAL | 0 refills | Status: DC
  Filled 2022-04-23: qty 90, 90d supply, fill #0

## 2022-05-01 ENCOUNTER — Other Ambulatory Visit (HOSPITAL_COMMUNITY): Payer: Self-pay

## 2022-05-05 ENCOUNTER — Other Ambulatory Visit (HOSPITAL_COMMUNITY): Payer: Self-pay

## 2022-05-11 NOTE — Psych (Signed)
Virtual Visit via Video Note  I connected with [name] on 01/10/22 at  9:00 AM EDT by a video enabled telemedicine application and verified that I am speaking with the correct person using two identifiers.  Location: Patient: patient home Provider: clinical home office   I discussed the limitations of evaluation and management by telemedicine and the availability of in person appointments. The patient expressed understanding and agreed to proceed.  I discussed the assessment and treatment plan with the patient. The patient was provided an opportunity to ask questions and all were answered. The patient agreed with the plan and demonstrated an understanding of the instructions.   The patient was advised to call back or seek an in-person evaluation if the symptoms worsen or if the condition fails to improve as anticipated.  Pt was provided 240 minutes of non-face-to-face time during this encounter.   Lorin Glass, LCSW   [facility] PHP THERAPIST PROGRESS NOTE  [name] [id number]  Session Time: 9:00 - 10:00  Participation Level: Active  Behavioral Response: CasualAlertDepressed  Type of Therapy: Group Therapy  Treatment Goals addressed: Coping  Progress Towards Goals: Progressing  Interventions: CBT, DBT, Supportive, and Reframing  Summary: Clinician led check-in regarding current stressors and situation, and review of patient completed daily inventory. Clinician utilized active listening and empathetic response and validated patient emotions. Clinician facilitated processing group on pertinent issues.?    Summary: [name] is a 26 y.o. female who presents with mood symptoms. Patient arrived within time allowed. Patient rates her mood at a 9 on a scale of 1-10 with 10 being best. Pt states she feels "good." Pt reports increased peace re: her relationship and self-worth. Pt reports AH is "normal" today and spending time with her family yesterday helped  them stay manageable. Pt struggles with mood fluctuations. Patient able to process. Patient engaged in discussion.         Session Time: 10:00 am - 11:00 am   Participation Level: Active   Behavioral Response: CasualAlertDepressed   Type of Therapy: Group Therapy   Treatment Goals addressed: Coping   Progress Towards Goals: Progressing   Interventions: CBT, DBT, Solution Focused, Strength-based, Supportive, and Reframing   Therapist Response: Cln led processing group for pt's current struggles. Group members shared stressors and provided support and feedback. Cln brought in topics of boundaries, healthy relationships, and unhealthy thought processes to inform discussion.   Therapist Response: Pt able to process and provide support to group.            Session Time: 11:00 -12:00   Participation Level: Active   Behavioral Response: CasualAlertDepressed   Type of Therapy: Group Therapy, Spiritual Care   Treatment Goals addressed: Coping   Progress Towards Goals: Progressing   Interventions: Supportive, Education   Summary:  [name], Chaplain, led group.   Therapist Response: Pt participated        Session Time: 12:00 -1:00   Participation Level: Active   Behavioral Response: CasualAlertDepressed   Type of Therapy: Group Therapy, OT   Treatment Goals addressed: Coping   Progress Towards Goals: Progressing   Interventions: Supportive, Education   Summary:  OT, Cornell Barman, led group. 12:50 -1:00 Clinician led check-out. Clinician assessed for immediate needs, medication compliance and efficacy, and safety concerns   Therapist Response: 12:00 - 12:50: See OT note  12:50 - 1:00 pm: At check-out, patient reports no immediate concerns. Patient demonstrates progress as evidenced by applying strategies. Patient denies SI/HI/self-harm thoughts at  the end of group.   Suicidal/Homicidal: Nowithout intent/plan  Plan: Pt will continue in PHP while working  to decrease mood symptoms, stabilize AH, and increase ability to manage symptoms in a healthy manner.   Collaboration of Care: Medication Management AEB T [place]  Patient/Guardian was advised Release of Information must be obtained prior to any record release in order to collaborate their care with an outside provider. Patient/Guardian was advised if they have not already done so to contact the registration department to sign all necessary forms in order for [name] to release information regarding their care.   Consent: Patient/Guardian gives verbal consent for treatment and assignment of benefits for services provided during this visit. Patient/Guardian expressed understanding and agreed to proceed.   Diagnosis: Schizoaffective disorder, bipolar type (HCC) [F25.0]    1. Schizoaffective disorder, bipolar type (HCC)     Donia Guiles, LCSW

## 2022-05-11 NOTE — Psych (Signed)
Virtual Visit via Video Note  I connected with [name] on 01/09/22 at  9:00 AM EDT by a video enabled telemedicine application and verified that I am speaking with the correct person using two identifiers.  Location: Patient: patient home Provider: clinical home office   I discussed the limitations of evaluation and management by telemedicine and the availability of in person appointments. The patient expressed understanding and agreed to proceed.  I discussed the assessment and treatment plan with the patient. The patient was provided an opportunity to ask questions and all were answered. The patient agreed with the plan and demonstrated an understanding of the instructions.   The patient was advised to call back or seek an in-person evaluation if the symptoms worsen or if the condition fails to improve as anticipated.  Pt was provided 240 minutes of non-face-to-face time during this encounter.   Lorin Glass, LCSW   [facility] PHP THERAPIST PROGRESS NOTE  MAURI SHEEDER [id number]  Session Time: 9:00 - 10:00  Participation Level: Active  Behavioral Response: CasualAlertDepressed  Type of Therapy: Group Therapy  Treatment Goals addressed: Coping  Progress Towards Goals: Progressing  Interventions: CBT, DBT, Supportive, and Reframing  Summary: Clinician led check-in regarding current stressors and situation, and review of patient completed daily inventory. Clinician utilized active listening and empathetic response and validated patient emotions. Clinician facilitated processing group on pertinent issues.?    Summary: [name] is a 26 y.o. female who presents with mood symptoms. Patient arrived within time allowed. Patient rates her mood at a 10 on a scale of 1-10 with 10 being best. Pt states she feels "good." Pt reports her AH are not as active today. Pt reports feeling proud of herself for a boundary she set over the weekend. Pt reports increased focus  over the weekend.  Patient able to process. Patient engaged in discussion.         Session Time: 10:00 am - 11:00 am   Participation Level: Active   Behavioral Response: CasualAlertDepressed   Type of Therapy: Group Therapy   Treatment Goals addressed: Coping   Progress Towards Goals: Progressing   Interventions: CBT, DBT, Solution Focused, Strength-based, Supportive, and Reframing   Therapist Response: Cln led discussion on setting boundaries as a way to increase self-care. Group members discussed things that are stumbling blocks to them engaging in self-care and worked to determine what boundary could address that stumbling block. Group worked together to determine how to address the boundary. Cln brought in topics of boundaries, assertiveness, thought challenging, and self-care.    Therapist Response: Pt engaged in discussion and identifies a boundary that can improve their self-care.            Session Time: 11:00 -12:00   Participation Level: Active   Behavioral Response: CasualAlertDepressed   Type of Therapy: Group Therapy, Occupational Therapy   Treatment Goals addressed: Coping   Progress Towards Goals: Progressing   Interventions: Supportive, Education   Summary:  Occupational Therapy group led by cln E. Hollan.   Therapist Response: See OT note         Session Time: 12:00 -1:00   Participation Level: Active   Behavioral Response: CasualAlertDepressed   Type of Therapy: Group therapy   Treatment Goals addressed: Coping   Progress Towards Goals: Progressing   Interventions: CBT; Solution focused; Supportive; Reframing   Summary: 12:00 - 12:50: Cln introduced grounding techniques as a coping strategy. Cln utilized handout "Detaching from emotional pain" from [employer]. Group reviewed grounding strategies and how they can apply them to their every day life and in which situations.  12:50 -1:00 Clinician led check-out. Clinician assessed  for immediate needs, medication compliance and efficacy, and safety concerns.   Therapist Response: 12:00 - 12:50: Pt engaged in discussion and is able to identify ways to utilize the techniques.  12:50 - 1:00 pm: At check-out, patient reports no immediate concerns. Patient demonstrates progress as evidenced by applying strategies from group. Patient denies SI/HI/self-harm thoughts at the end of group.   Suicidal/Homicidal: Nowithout intent/plan  Plan: Pt will continue in PHP while working to decrease mood symptoms, stabilize AH, and increase ability to manage symptoms in a healthy manner.   Collaboration of Care: Medication Management AEB T Lewis  Patient/Guardian was advised Release of Information must be obtained prior to any record release in order to collaborate their care with an outside provider. Patient/Guardian was advised if they have not already done so to contact the registration department to sign all necessary forms in order for [name] to release information regarding their care.   Consent: Patient/Guardian gives verbal consent for treatment and assignment of benefits for services provided during this visit. Patient/Guardian expressed understanding and agreed to proceed.   Diagnosis: Schizoaffective disorder, bipolar type ([place]) [F25.0]    1. Schizoaffective disorder, bipolar type ([place])   2. Difficulty coping     Lorin Glass, LCSW

## 2022-05-11 NOTE — Psych (Signed)
Virtual Visit via Video Note  I connected with [name] on 01/04/22 at  9:00 AM EDT by a video enabled telemedicine application and verified that I am speaking with the correct person using two identifiers.  Location: Patient: patient home Provider: clinical home office   I discussed the limitations of evaluation and management by telemedicine and the availability of in person appointments. The patient expressed understanding and agreed to proceed.  I discussed the assessment and treatment plan with the patient. The patient was provided an opportunity to ask questions and all were answered. The patient agreed with the plan and demonstrated an understanding of the instructions.   The patient was advised to call back or seek an in-person evaluation if the symptoms worsen or if the condition fails to improve as anticipated.  Pt was provided 240 minutes of non-face-to-face time during this encounter.   Donia Guiles, LCSW   [facility] PHP THERAPIST PROGRESS NOTE  [name] [id number]  Session Time: 9:00 - 10:00  Participation Level: Active  Behavioral Response: CasualAlertDepressed  Type of Therapy: Group Therapy  Treatment Goals addressed: Coping  Progress Towards Goals: Initial  Interventions: CBT, DBT, Supportive, and Reframing  Summary: Clinician led check-in regarding current stressors and situation, and review of patient completed daily inventory. Clinician utilized active listening and empathetic response and validated patient emotions. Clinician facilitated processing group on pertinent issues.?    Summary: [name] is a 26 y.o. female who presents with mood symptoms. Patient arrived within time allowed. Patient rates her mood at a 7 on a scale of 1-10 with 10 being best. Pt states she feels "okay." Pt reports her AH is loud today and denies any command hallucinations. Pt states she was depressed yesterday and stayed in bed most of the day. Pt  reports fleeting passive SI which she managed by speaking to mom. Pt reports 9 hours of sleep and medication compliance. Patient able to process. Patient engaged in discussion.         Session Time: 10:00 am - 11:00 am   Participation Level: Active   Behavioral Response: CasualAlertDepressed   Type of Therapy: Group Therapy   Treatment Goals addressed: Coping   Progress Towards Goals: Progressing   Interventions: CBT, DBT, Solution Focused, Strength-based, Supportive, and Reframing   Therapist Response:  Cln led discussion on accountability and the balance between taking responsibility and not beating ourselves up. Group members shared current consequences they are dealing with and how they are processing them. Cln encouraged pt's to utilize the Best Friend Test to make it easier to offer themselves kindness.    Therapist Response: Pt engaged in discussion and is able to process.            Session Time: 11:00 -12:00   Participation Level: Active   Behavioral Response: CasualAlertDepressed   Type of Therapy: Group Therapy, Occupational Therapy   Treatment Goals addressed: Coping   Progress Towards Goals: Progressing   Interventions: Supportive, Education   Summary:  Occupational Therapy group led by cln E. Hollan.   Therapist Response: See OT note         Session Time: 12:00 -1:00   Participation Level: Active   Behavioral Response: CasualAlertDepressed   Type of Therapy: Group therapy   Treatment Goals addressed: Coping   Progress Towards Goals: Progressing   Interventions: CBT; Solution focused; Supportive; Reframing   Summary: 12:00 - 12:50: Cln continued topic of boundaries and led a "boundary workshop" in which group members  brought current boundary issues and group worked together to apply boundary concepts to help address the concern. Cln helped shape conversation to maintain fidelity.  12:50 -1:00 Clinician led check-out. Clinician assessed for  immediate needs, medication compliance and efficacy, and safety concerns.   Therapist Response: 12:00 - 12:50: Pt engaged in discussion and shared a current boundary issue and reports gaining insight.  12:50 - 1:00 pm: At check-out, patient reports no immediate concerns. Patient demonstrates progress as evidenced by participating in first group session. Patient denies SI/HI/self-harm thoughts at the end of group.   Suicidal/Homicidal: Nowithout intent/plan  Plan: Pt will continue in PHP while working to decrease mood symptoms, stabilize AH, and increase ability to manage symptoms in a healthy manner.   Collaboration of Care: Medication Management AEB T Lewis  Patient/Guardian was advised Release of Information must be obtained prior to any record release in order to collaborate their care with an outside provider. Patient/Guardian was advised if they have not already done so to contact the registration department to sign all necessary forms in order for [name] to release information regarding their care.   Consent: Patient/Guardian gives verbal consent for treatment and assignment of benefits for services provided during this visit. Patient/Guardian expressed understanding and agreed to proceed.   Diagnosis: Schizoaffective disorder, bipolar type ([place]) [F25.0]    1. Schizoaffective disorder, bipolar type ([place])     Lorin Glass, LCSW

## 2022-05-11 NOTE — Psych (Signed)
Virtual Visit via Video Note  I connected with JILLANE PO on 01/12/22 at  9:00 AM EDT by a video enabled telemedicine application and verified that I am speaking with the correct person using two identifiers.  Location: Patient: patient home Provider: clinical home office   I discussed the limitations of evaluation and management by telemedicine and the availability of in person appointments. The patient expressed understanding and agreed to proceed.  I discussed the assessment and treatment plan with the patient. The patient was provided an opportunity to ask questions and all were answered. The patient agreed with the plan and demonstrated an understanding of the instructions.   The patient was advised to call back or seek an in-person evaluation if the symptoms worsen or if the condition fails to improve as anticipated.  Pt was provided 240 minutes of non-face-to-face time during this encounter.   Lorin Glass, LCSW   [facility] PHP THERAPIST PROGRESS NOTE  MIYEKO MAHLUM [id number]  Session Time: 9:00 - 10:00  Participation Level: Active  Behavioral Response: CasualAlertDepressed  Type of Therapy: Group Therapy  Treatment Goals addressed: Coping  Progress Towards Goals: Progressing  Interventions: CBT, DBT, Supportive, and Reframing  Summary: Clinician led check-in regarding current stressors and situation, and review of patient completed daily inventory. Clinician utilized active listening and empathetic response and validated patient emotions. Clinician facilitated processing group on pertinent issues.?    Summary: JOCELYNNE DUQUETTE is a 26 y.o. female who presents with mood symptoms. Patient arrived within time allowed. Patient rates her mood at a 7 on a scale of 1-10 with 10 being best. Pt states she feels "okay." Pt reports she and her partner broke up and pt is sad, but know it was the right thing. Pt reports AH was louder after the breakup and pt listened to  music to manage the feelings and hallucinations. Patient able to process. Patient engaged in discussion.         Session Time: 10:00 am - 11:00 am   Participation Level: Active   Behavioral Response: CasualAlertDepressed   Type of Therapy: Group Therapy   Treatment Goals addressed: Coping   Progress Towards Goals: Progressing   Interventions: CBT, DBT, Solution Focused, Strength-based, Supportive, and Reframing   Therapist Response: Cln led discussion on planning ahead as a way to mitigate anxiety. Group members shared worries about keeping up good habits when returning to "normal" life post-treatment. Group able to brainstorm ways to build habits now and how they can fit into their life after treatment.   Therapist Response:  Pt engaged in discussion and identified ways to plan ahead for anxieties.            Session Time: 11:00 -12:00   Participation Level: Active   Behavioral Response: CasualAlertDepressed   Type of Therapy: Group Therapy, Occupational Therapy   Treatment Goals addressed: Coping   Progress Towards Goals: Progressing   Interventions: Supportive, Education   Summary:  Occupational Therapy group led by cln E. Hollan.   Therapist Response: See OT note         Session Time: 12:00 -1:00   Participation Level: Active   Behavioral Response: CasualAlertDepressed   Type of Therapy: Group therapy   Treatment Goals addressed: Coping   Progress Towards Goals: Progressing   Interventions: CBT; Solution focused; Supportive; Reframing   Summary: 12:00 - 12:50: Cln led discussion on saying "no." Group members shared struggles they have with saying no and how it affects them. Cln utilized boundaries,  communication, and self-esteem tenets to inform discussion.  12:50 -1:00 Clinician led check-out. Clinician assessed for immediate needs, medication compliance and efficacy, and safety concerns.   Therapist Response: 12:00 - 12:50: Pt engaged in  discussion and reports increased understanding of how to say "no."  12:50 - 1:00 pm: At check-out, patient reports no immediate concerns. Patient demonstrates progress as evidenced by prioritizing herself. Patient denies SI/HI/self-harm thoughts at the end of group.   Suicidal/Homicidal: Nowithout intent/plan  Plan: Pt will continue in PHP while working to decrease mood symptoms, stabilize AH, and increase ability to manage symptoms in a healthy manner.   Collaboration of Care: Medication Management AEB T Lewis  Patient/Guardian was advised Release of Information must be obtained prior to any record release in order to collaborate their care with an outside provider. Patient/Guardian was advised if they have not already done so to contact the registration department to sign all necessary forms in order for [name] to release information regarding their care.   Consent: Patient/Guardian gives verbal consent for treatment and assignment of benefits for services provided during this visit. Patient/Guardian expressed understanding and agreed to proceed.   Diagnosis: Schizoaffective disorder, bipolar type ([facility]) [F25.0]    1. Schizoaffective disorder, bipolar type ([facility])     Donia Guiles, LCSW

## 2022-05-11 NOTE — Psych (Signed)
Virtual Visit via Video Note  I connected with [name] on 01/11/22 at  9:00 AM EDT by a video enabled telemedicine application and verified that I am speaking with the correct person using two identifiers.  Location: Patient: patient home Provider: clinical home office   I discussed the limitations of evaluation and management by telemedicine and the availability of in person appointments. The patient expressed understanding and agreed to proceed.  I discussed the assessment and treatment plan with the patient. The patient was provided an opportunity to ask questions and all were answered. The patient agreed with the plan and demonstrated an understanding of the instructions.   The patient was advised to call back or seek an in-person evaluation if the symptoms worsen or if the condition fails to improve as anticipated.  Pt was provided 240 minutes of non-face-to-face time during this encounter.   Lorin Glass, LCSW   [facility] PHP THERAPIST PROGRESS NOTE  [name] [id number]  Session Time: 9:00 - 10:00  Participation Level: Active  Behavioral Response: CasualAlertDepressed  Type of Therapy: Group Therapy  Treatment Goals addressed: Coping  Progress Towards Goals: Progressing  Interventions: CBT, DBT, Supportive, and Reframing  Summary: Clinician led check-in regarding current stressors and situation, and review of patient completed daily inventory. Clinician utilized active listening and empathetic response and validated patient emotions. Clinician facilitated processing group on pertinent issues.?    Summary: [name] is a 26 y.o. female who presents with mood symptoms. Patient arrived within time allowed. Patient rates her mood at a 10 on a scale of 1-10 with 10 being best. Pt states she feels "good." Pt reports dealing with feelings of worthlessness yesterday and it was worsened by her AH. Pt reports she watched movies and did some cleaning to  attempt to cope with some success. Pt struggles with mood dependent thinking. Patient able to process. Patient engaged in discussion.         Session Time: 10:00 am - 11:00 am   Participation Level: Active   Behavioral Response: CasualAlertDepressed   Type of Therapy: Group Therapy   Treatment Goals addressed: Coping   Progress Towards Goals: Progressing   Interventions: CBT, DBT, Solution Focused, Strength-based, Supportive, and Reframing   Therapist Response: Cln led discussion on the 5 stages of grief and the way loss affects [name]. Cln encouraged pt to consider grief as a journey to accepting a new future. Cln created space for pt to process grief concerns and validated pt's experiences.    Therapist Response:  Pt engaged in discussion and was able to identify areas of loss and process.            Session Time: 11:00 -12:00   Participation Level: Active   Behavioral Response: CasualAlertDepressed   Type of Therapy: Group Therapy, Occupational Therapy   Treatment Goals addressed: Coping   Progress Towards Goals: Progressing   Interventions: Supportive, Education   Summary:  Occupational Therapy group led by cln E. Hollan.   Therapist Response: See OT note         Session Time: 12:00 -1:00   Participation Level: Active   Behavioral Response: CasualAlertDepressed   Type of Therapy: Group therapy   Treatment Goals addressed: Coping   Progress Towards Goals: Progressing   Interventions: CBT; Solution focused; Supportive; Reframing   Summary: 12:00 - 12:50: Cln led discussion on "why" and "what now" in terms of how we focus on our problems. Group members shared how focus on "why" has  impacted them and barriers to focusing on "what now." Group able to process. Cln encouraged pt's to consider what "why" accomplishes.  12:50 -1:00 Clinician led check-out. Clinician assessed for immediate needs, medication compliance and efficacy, and safety concerns.    Therapist Response: 12:00 - 12:50: Pt engaged in discussion. 12:50 - 1:00 pm: At check-out, patient reports no immediate concerns. Patient demonstrates progress as evidenced by utilizing coping skills. Patient denies SI/HI/self-harm thoughts at the end of group.   Suicidal/Homicidal: Nowithout intent/plan  Plan: Pt will continue in PHP while working to decrease mood symptoms, stabilize AH, and increase ability to manage symptoms in a healthy manner.   Collaboration of Care: Medication Management AEB T Lewis  Patient/Guardian was advised Release of Information must be obtained prior to any record release in order to collaborate their care with an outside provider. Patient/Guardian was advised if they have not already done so to contact the registration department to sign all necessary forms in order for [name] to release information regarding their care.   Consent: Patient/Guardian gives verbal consent for treatment and assignment of benefits for services provided during this visit. Patient/Guardian expressed understanding and agreed to proceed.   Diagnosis: Schizoaffective disorder, bipolar type ([facility]) [F25.0]    1. Schizoaffective disorder, bipolar type ([facility])     Donia Guiles, LCSW

## 2022-05-11 NOTE — Psych (Signed)
Virtual Visit via Video Note  I connected with [name] on 01/05/22 at  9:00 AM EDT by a video enabled telemedicine application and verified that I am speaking with the correct person using two identifiers.  Location: Patient: patient home Provider: clinical home office   I discussed the limitations of evaluation and management by telemedicine and the availability of in person appointments. The patient expressed understanding and agreed to proceed.  I discussed the assessment and treatment plan with the patient. The patient was provided an opportunity to ask questions and all were answered. The patient agreed with the plan and demonstrated an understanding of the instructions.   The patient was advised to call back or seek an in-person evaluation if the symptoms worsen or if the condition fails to improve as anticipated.  Pt was provided 240 minutes of non-face-to-face time during this encounter.   Lorin Glass, LCSW   [facility] PHP THERAPIST PROGRESS NOTE  [name] [id number]  Session Time: 9:00 - 10:00  Participation Level: Active  Behavioral Response: CasualAlertDepressed  Type of Therapy: Group Therapy  Treatment Goals addressed: Coping  Progress Towards Goals: Initial  Interventions: CBT, DBT, Supportive, and Reframing  Summary: Clinician led check-in regarding current stressors and situation, and review of patient completed daily inventory. Clinician utilized active listening and empathetic response and validated patient emotions. Clinician facilitated processing group on pertinent issues.?    Summary: [name] is a 26 y.o. female who presents with mood symptoms. Patient arrived within time allowed. Patient rates her mood at a 6 on a scale of 1-10 with 10 being best. Pt states she feels "okay." Pt reports her AH is present but not bothersome today and denies any command hallucinations. Pt reports staying in bed after group yesterday and feeling  "sad" because of issues with her boyfriend. Pt states feeling confused about their relationship and it affecting her mood. Pt reports impaired focus. Patient able to process. Patient engaged in discussion.         Session Time: 10:00 am - 11:00 am   Participation Level: Active   Behavioral Response: CasualAlertDepressed   Type of Therapy: Group Therapy   Treatment Goals addressed: Coping   Progress Towards Goals: Progressing   Interventions: CBT, DBT, Solution Focused, Strength-based, Supportive, and Reframing   Therapist Response: Cln led discussion on the ways our phones can cause barriers to our mental health. Group discussed struggles they have with their phones and the role it can play in unhealthy thought patterns and behaviors. Group worked to problem solve ways to cut down on negative behaviors.    Therapist Response: Pt engaged in discussion.        Session Time: 11:00 -12:00   Participation Level: Active   Behavioral Response: CasualAlertDepressed   Type of Therapy: Group Therapy   Treatment Goals addressed: Coping   Progress Towards Goals: Progressing   Interventions: CBT, DBT, Solution Focused, Strength-based, Supportive, and Reframing   Summary:  Cln led discussion on the connection between fear and anxiety. Cln contextualized anxiety and fear as both being future oriented and fed by the unknown. Group members shared ways in which fear interacts with their anxiety and the problems it creates. Cln encouraged CBT thought challenging and reframing as ways to address problematic fears and anxieties.    Therapist Response:  Pt engaged in discussion and is able to make connections between fear and anxiety.          Session Time: 12:00 -1:00  Participation Level: Active   Behavioral Response: CasualAlertDepressed   Type of Therapy: Group therapy   Treatment Goals addressed: Coping   Progress Towards Goals: Progressing   Interventions: CBT; Solution  focused; Supportive; Reframing   Summary: 12:00 - 12:50: Cln led discussion on decision making and how to apply logic to fears. Group viewed TED talk "Why you should define your fears not your goals" to aid discussion. Group discussed ways to consider how we can address fears and make them manageable.  12:50 -1:00 Clinician led check-out. Clinician assessed for immediate needs, medication compliance and efficacy, and safety concerns.   Therapist Response: 12:00 - 12:50: Pt engaged in discussion and practices decision making model with group. 12:50 - 1:00 pm: At check-out, patient reports no immediate concerns. Patient demonstrates progress as evidenced by increased sharing. Patient denies SI/HI/self-harm thoughts at the end of group.   Suicidal/Homicidal: Nowithout intent/plan  Plan: Pt will continue in PHP while working to decrease mood symptoms, stabilize AH, and increase ability to manage symptoms in a healthy manner.   Collaboration of Care: Medication Management AEB T Lewis  Patient/Guardian was advised Release of Information must be obtained prior to any record release in order to collaborate their care with an outside provider. Patient/Guardian was advised if they have not already done so to contact the registration department to sign all necessary forms in order for [name] to release information regarding their care.   Consent: Patient/Guardian gives verbal consent for treatment and assignment of benefits for services provided during this visit. Patient/Guardian expressed understanding and agreed to proceed.   Diagnosis: Schizoaffective disorder, bipolar type (HCC) [F25.0]    1. Schizoaffective disorder, bipolar type (HCC)   2. Difficulty coping     Donia Guiles, LCSW

## 2022-05-12 NOTE — Psych (Signed)
Virtual Visit via Video Note  I connected with [name] on 01/19/22 at  9:00 AM EDT by a video enabled telemedicine application and verified that I am speaking with the correct person using two identifiers.  Location: Patient: patient home Provider: clinical home office   I discussed the limitations of evaluation and management by telemedicine and the availability of in person appointments. The patient expressed understanding and agreed to proceed.  I discussed the assessment and treatment plan with the patient. The patient was provided an opportunity to ask questions and all were answered. The patient agreed with the plan and demonstrated an understanding of the instructions.   The patient was advised to call back or seek an in-person evaluation if the symptoms worsen or if the condition fails to improve as anticipated.  Pt was provided 240 minutes of non-face-to-face time during this encounter.   Lorin Glass, LCSW   [facility] PHP THERAPIST PROGRESS NOTE  [name] [id number]  Session Time: 9:00 - 10:00  Participation Level: Active  Behavioral Response: CasualAlertDepressed  Type of Therapy: Group Therapy  Treatment Goals addressed: Coping  Progress Towards Goals: Progressing  Interventions: CBT, DBT, Supportive, and Reframing  Summary: Clinician led check-in regarding current stressors and situation, and review of patient completed daily inventory. Clinician utilized active listening and empathetic response and validated patient emotions. Clinician facilitated processing group on pertinent issues.?    Summary: [name] is a 26 y.o. female who presents with mood symptoms. Patient arrived within time allowed. Patient rates her mood at a 6 on a scale of 1-10 with 10 being best. Pt states she feels "ornery." Pt reports being in a "not great" mood yesterday. Pt states her AH were encouraging self-harm and pt was able to not act and talk back to the [facility].  Pt reports struggling with keeping herself occupied. Patient able to process. Patient engaged in discussion.         Session Time: 10:00 am - 11:00 am   Participation Level: Active   Behavioral Response: CasualAlertDepressed   Type of Therapy: Group Therapy   Treatment Goals addressed: Coping   Progress Towards Goals: Progressing   Interventions: CBT, DBT, Solution Focused, Strength-based, Supportive, and Reframing   Therapist Response: Cln led discussion on family dynamics and the way in which they impact [name]. Group members shared struggles with their families and the way the patterns of behaviors have negatively impacted them. Cln provided space to process and validated pt's experiences.     Therapist Response:  Pt engaged in discussion and is able to process.         Session Time: 11:00 -12:00   Participation Level: Active   Behavioral Response: CasualAlertDepressed   Type of Therapy: Group Therapy, Occupational Therapy   Treatment Goals addressed: Coping   Progress Towards Goals: Progressing   Interventions: Supportive, Education   Summary:  Occupational Therapy group led by cln E. Hollan.   Therapist Response: See OT note         Session Time: 12:00 -1:00   Participation Level: Active   Behavioral Response: CasualAlertDepressed   Type of Therapy: Group therapy   Treatment Goals addressed: Coping   Progress Towards Goals: Progressing   Interventions: CBT; Solution focused; Supportive; Reframing   Summary: 12:00 - 12:50: Cln introduced DBT concept of radical acceptance. Cln discussed radical acceptance as a strategy to decrease distress and to manage situations outside of their control. Group volunteered struggles they are experiencing with control and  cln helped group apply radical acceptance.  12:50 -1:00 Clinician led check-out. Clinician assessed for immediate needs, medication compliance and efficacy, and safety concerns.   Therapist Response:  12:00 - 12:50: Pt engaged in discussion and identified ways they can apply radical acceptance in their life.  12:50 - 1:00 pm: At check-out, patient reports no immediate concerns. Patient demonstrates progress as evidenced by increased use of coping skills. Patient denies SI/HI/self-harm thoughts at the end of group.   Suicidal/Homicidal: Nowithout intent/plan  Plan: Pt will continue in PHP while working to decrease mood symptoms, stabilize AH, and increase ability to manage symptoms in a healthy manner.   Collaboration of Care: Medication Management AEB T Lewis  Patient/Guardian was advised Release of Information must be obtained prior to any record release in order to collaborate their care with an outside provider. Patient/Guardian was advised if they have not already done so to contact the registration department to sign all necessary forms in order for [name] to release information regarding their care.   Consent: Patient/Guardian gives verbal consent for treatment and assignment of benefits for services provided during this visit. Patient/Guardian expressed understanding and agreed to proceed.   Diagnosis: Schizoaffective disorder, bipolar type (HCC) [F25.0]    1. Schizoaffective disorder, bipolar type (HCC)   2. Difficulty coping     Donia Guiles, LCSW

## 2022-05-12 NOTE — Psych (Signed)
Virtual Visit via Video Note  I connected with [name] on 01/22/22 at  9:00 AM EDT by a video enabled telemedicine application and verified that I am speaking with the correct person using two identifiers.  Location: Patient: patient home Provider: clinical home office   I discussed the limitations of evaluation and management by telemedicine and the availability of in person appointments. The patient expressed understanding and agreed to proceed.  I discussed the assessment and treatment plan with the patient. The patient was provided an opportunity to ask questions and all were answered. The patient agreed with the plan and demonstrated an understanding of the instructions.   The patient was advised to call back or seek an in-person evaluation if the symptoms worsen or if the condition fails to improve as anticipated.  Pt was provided 240 minutes of non-face-to-face time during this encounter.   Donia Guiles, LCSW   [facility] PHP THERAPIST PROGRESS NOTE  [name] [id number]  Session Time: 9:00 - 10:00  Participation Level: Active  Behavioral Response: CasualAlertDepressed  Type of Therapy: Group Therapy  Treatment Goals addressed: Coping  Progress Towards Goals: Progressing  Interventions: CBT, DBT, Supportive, and Reframing  Summary: Clinician led check-in regarding current stressors and situation, and review of patient completed daily inventory. Clinician utilized active listening and empathetic response and validated patient emotions. Clinician facilitated processing group on pertinent issues.?    Summary: [name] is a 26 y.o. female who presents with mood symptoms. Patient arrived within time allowed. Patient rates her mood at a 7 on a scale of 1-10 with 10 being best. Pt states her AH was loud this weekend because she spent more time alone. Pt reports she was able to focus on a movie and was excited. Pt reports struggling with keeping her  resolve. Patient able to process. Patient engaged in discussion.         Session Time: 10:00 am - 11:00 am   Participation Level: Active   Behavioral Response: CasualAlertDepressed   Type of Therapy: Group Therapy   Treatment Goals addressed: Coping   Progress Towards Goals: Progressing   Interventions: CBT, DBT, Solution Focused, Strength-based, Supportive, and Reframing   Therapist Response: Cln led discussion on personal standards and they way in which it impacts the way we view ourselves and our abilities. Group members discussed judgment, struggles, and barriers they experience in terms of personal standards. Cln brought in topics of balance, grace, and kindness. Cln proposed the "best friend test" as a way to calibrate whether we are viewing our situation with kindness or harshness.     Therapist Response: Pt engaged in discussion and reports willingness to utilize the best friend test.          Session Time: 11:00 -12:00   Participation Level: Active   Behavioral Response: CasualAlertDepressed   Type of Therapy: Group Therapy, Occupational Therapy   Treatment Goals addressed: Coping   Progress Towards Goals: Progressing   Interventions: Supportive, Education   Summary:  Occupational Therapy group led by cln E. Hollan.   Therapist Response: See OT note         Session Time: 12:00 -1:00   Participation Level: Active   Behavioral Response: CasualAlertDepressed   Type of Therapy: Group therapy   Treatment Goals addressed: Coping   Progress Towards Goals: Progressing   Interventions: CBT; Solution focused; Supportive; Reframing   Summary: 12:00 - 12:50: Cln introduced topic of CBT cognitive distortions. Cln discussed unhealthy thought patterns and  how our thoughts shape our reality and irrational thoughts can alter our perspective. Cln utilized handout "cognitive distortions" to discuss common examples of distorted thoughts and group members worked to  identify examples in their own life.  12:50 -1:00 Clinician led check-out. Clinician assessed for immediate needs, medication compliance and efficacy, and safety concerns.   Therapist Response: 12:00 - 12:50: Pt engaged in discussion and is able to determine examples of distorted thinking in their own life.  12:50 - 1:00 pm: At check-out, patient reports no immediate concerns. Patient demonstrates progress as evidenced by increased focus. Patient denies SI/HI/self-harm thoughts at the end of group.   Suicidal/Homicidal: Nowithout intent/plan  Plan: Pt will continue in PHP while working to decrease mood symptoms, stabilize AH, and increase ability to manage symptoms in a healthy manner.   Collaboration of Care: Medication Management AEB T Lewis  Patient/Guardian was advised Release of Information must be obtained prior to any record release in order to collaborate their care with an outside provider. Patient/Guardian was advised if they have not already done so to contact the registration department to sign all necessary forms in order for [name] to release information regarding their care.   Consent: Patient/Guardian gives verbal consent for treatment and assignment of benefits for services provided during this visit. Patient/Guardian expressed understanding and agreed to proceed.   Diagnosis: Schizoaffective disorder, bipolar type ([place]) [F25.0]    1. Schizoaffective disorder, bipolar type ([place])     Lorin Glass, LCSW

## 2022-05-12 NOTE — Psych (Signed)
Virtual Visit via Video Note  I connected with [name] on 01/23/22 at  9:00 AM EDT by a video enabled telemedicine application and verified that I am speaking with the correct person using two identifiers.  Location: Patient: patient home Provider: clinical home office   I discussed the limitations of evaluation and management by telemedicine and the availability of in person appointments. The patient expressed understanding and agreed to proceed.  I discussed the assessment and treatment plan with the patient. The patient was provided an opportunity to ask questions and all were answered. The patient agreed with the plan and demonstrated an understanding of the instructions.   The patient was advised to call back or seek an in-person evaluation if the symptoms worsen or if the condition fails to improve as anticipated.  Pt was provided 240 minutes of non-face-to-face time during this encounter.   Lorin Glass, LCSW   [facility] PHP THERAPIST PROGRESS NOTE  [name] [id number]  Session Time: 9:00 - 10:00  Participation Level: Active  Behavioral Response: CasualAlertDepressed  Type of Therapy: Group Therapy  Treatment Goals addressed: Coping  Progress Towards Goals: Progressing  Interventions: CBT, DBT, Supportive, and Reframing  Summary: Clinician led check-in regarding current stressors and situation, and review of patient completed daily inventory. Clinician utilized active listening and empathetic response and validated patient emotions. Clinician facilitated processing group on pertinent issues.?    Summary: [name] is a 26 y.o. female who presents with mood symptoms. Patient arrived within time allowed. Patient rates her mood at a 7 on a scale of 1-10 with 10 being best. Pt reports she is out of bed which is a success for her. Pt states she is noticing progress and is retaining more information, more focused, and paranoia is decreasing. Pt  states being irritated when she left the house yesterday and notes her paranoia flares up in public.  Patient able to process. Patient engaged in discussion.         Session Time: 10:00 am - 11:00 am   Participation Level: Active   Behavioral Response: CasualAlertDepressed   Type of Therapy: Group Therapy   Treatment Goals addressed: Coping   Progress Towards Goals: Progressing   Interventions: CBT, DBT, Solution Focused, Strength-based, Supportive, and Reframing   Therapist Response: Cln led discussion on personal standards and they way in which it impacts the way we view ourselves and our abilities. Group members discussed judgment, struggles, and barriers they experience in terms of personal standards. Cln brought in topics of balance, grace, and kindness. Cln proposed the "best friend test" as a way to calibrate whether we are viewing our situation with kindness or harshness.     Therapist Response: Pt engaged in discussion and reports willingness to utilize the best friend test.          Session Time: 11:00 -12:00   Participation Level: Active   Behavioral Response: CasualAlertDepressed   Type of Therapy: Group Therapy, Occupational Therapy   Treatment Goals addressed: Coping   Progress Towards Goals: Progressing   Interventions: Supportive, Education   Summary:  Occupational Therapy group led by cln E. Hollan.   Therapist Response: See OT note         Session Time: 12:00 -1:00   Participation Level: Active   Behavioral Response: CasualAlertDepressed   Type of Therapy: Group therapy   Treatment Goals addressed: Coping   Progress Towards Goals: Progressing   Interventions: CBT; Solution focused; Supportive; Reframing   Summary: 12:00 -  12:50: Cln introduced topic of CBT cognitive distortions. Cln discussed unhealthy thought patterns and how our thoughts shape our reality and irrational thoughts can alter our perspective. Cln utilized handout "cognitive  distortions" to discuss common examples of distorted thoughts and group members worked to identify examples in their own life.  12:50 -1:00 Clinician led check-out. Clinician assessed for immediate needs, medication compliance and efficacy, and safety concerns.   Therapist Response: 12:00 - 12:50: Pt engaged in discussion and is able to determine examples of distorted thinking in their own life.  12:50 - 1:00 pm: At check-out, patient reports no immediate concerns. Patient demonstrates progress as evidenced by decreased paranoia. Patient denies SI/HI/self-harm thoughts at the end of group.   Suicidal/Homicidal: Nowithout intent/plan  Plan: Pt will continue in PHP while working to decrease mood symptoms, stabilize AH, and increase ability to manage symptoms in a healthy manner.   Collaboration of Care: Medication Management AEB T Lewis  Patient/Guardian was advised Release of Information must be obtained prior to any record release in order to collaborate their care with an outside provider. Patient/Guardian was advised if they have not already done so to contact the registration department to sign all necessary forms in order for [name] to release information regarding their care.   Consent: Patient/Guardian gives verbal consent for treatment and assignment of benefits for services provided during this visit. Patient/Guardian expressed understanding and agreed to proceed.   Diagnosis: Schizoaffective disorder, bipolar type ([place]) [F25.0]    1. Schizoaffective disorder, bipolar type ([place])     Lorin Glass, LCSW

## 2022-05-12 NOTE — Psych (Signed)
Virtual Visit via Video Note  I connected with [name] on 01/26/22 at  9:00 AM EDT by a video enabled telemedicine application and verified that I am speaking with the correct person using two identifiers.  Location: Patient: patient home Provider: clinical home office   I discussed the limitations of evaluation and management by telemedicine and the availability of in person appointments. The patient expressed understanding and agreed to proceed.  I discussed the assessment and treatment plan with the patient. The patient was provided an opportunity to ask questions and all were answered. The patient agreed with the plan and demonstrated an understanding of the instructions.   The patient was advised to call back or seek an in-person evaluation if the symptoms worsen or if the condition fails to improve as anticipated.  Pt was provided 240 minutes of non-face-to-face time during this encounter.   Donia Guiles, LCSW   [facility] PHP THERAPIST PROGRESS NOTE  [name] [id number]  Session Time: 9:00 - 10:00  Participation Level: Active  Behavioral Response: CasualAlertDepressed  Type of Therapy: Group Therapy  Treatment Goals addressed: Coping  Progress Towards Goals: Progressing  Interventions: CBT, DBT, Supportive, and Reframing  Summary: Clinician led check-in regarding current stressors and situation, and review of patient completed daily inventory. Clinician utilized active listening and empathetic response and validated patient emotions. Clinician facilitated processing group on pertinent issues.?    Summary: [name] is a 26 y.o. female who presents with mood symptoms. Patient arrived within time allowed. Patient rates her mood at a 5 on a scale of 1-10 with 10 being best. Pt states feeling "tired." Pt reports increase in AH and paranoid thoughts yesterday. Pt states feeling like "public enemy number one." Pt is able to recognize thoughts as  hallucinations and paranoia. Pt reports struggle with utilizing coping skills when worked up. Patient able to process. Patient engaged in discussion.         Session Time: 10:00 am - 11:00 am   Participation Level: Active   Behavioral Response: CasualAlertDepressed   Type of Therapy: Group Therapy   Treatment Goals addressed: Coping   Progress Towards Goals: Progressing   Interventions: CBT, DBT, Solution Focused, Strength-based, Supportive, and Reframing   Therapist Response: Cln continued topic of positive self-esteem. Group members shared struggles with appearance and how that impacts their self-esteem. Cln encouraged pt's to consider what their body does for them as a way to reframe physical self-esteem.     Therapist Response: Pt engaged in discussion and shares struggles. Pt is able to state something her body does for her.           Session Time: 11:00 -12:00   Participation Level: Active   Behavioral Response: CasualAlertDepressed   Type of Therapy: Group Therapy, Occupational Therapy   Treatment Goals addressed: Coping   Progress Towards Goals: Progressing   Interventions: Supportive, Education   Summary:  Occupational Therapy group led by cln E. Hollan.   Therapist Response: See OT note       Session Time: 12:00 -1:00   Participation Level: Active   Behavioral Response: CasualAlertDepressed   Type of Therapy: Group therapy   Treatment Goals addressed: Coping   Progress Towards Goals: Progressing   Interventions: CBT; Solution focused; Supportive; Reframing   Summary: 12:00 - 12:50: Cln led discussion on valuing ourselves in the same way, or more than we value others. Group viewed TED talk "The Person You Really Need to Marry" to facilitate discussion. Group  discussed what it would be like to view themselves as they do a romantic partner and treat themselves with as much care.  12:50 -1:00 Clinician led check-out. Clinician assessed for immediate  needs, medication compliance and efficacy, and safety concerns.   Therapist Response: 12:00 - 12:50:  Pt engaged in discussion and is able to share struggles with valuing themself. 12:50 - 1:00 pm: At check-out, patient reports no immediate concerns. Patient demonstrates progress as evidenced by using topics in group. Patient denies SI/HI/self-harm thoughts at the end of group.   Suicidal/Homicidal: Nowithout intent/plan  Plan: Pt will continue in PHP while working to decrease mood symptoms, stabilize AH, and increase ability to manage symptoms in a healthy manner.   Collaboration of Care: Medication Management AEB T Lewis  Patient/Guardian was advised Release of Information must be obtained prior to any record release in order to collaborate their care with an outside provider. Patient/Guardian was advised if they have not already done so to contact the registration department to sign all necessary forms in order for [name] to release information regarding their care.   Consent: Patient/Guardian gives verbal consent for treatment and assignment of benefits for services provided during this visit. Patient/Guardian expressed understanding and agreed to proceed.   Diagnosis: Schizoaffective disorder, bipolar type ([place]) [F25.0]    1. Schizoaffective disorder, bipolar type ([place])      Lorin Glass, LCSW

## 2022-05-12 NOTE — Psych (Signed)
Virtual Visit via Video Note  I connected with [name] on 01/18/22 at  9:00 AM EDT by a video enabled telemedicine application and verified that I am speaking with the correct person using two identifiers.  Location: Patient: patient home Provider: clinical home office   I discussed the limitations of evaluation and management by telemedicine and the availability of in person appointments. The patient expressed understanding and agreed to proceed.  I discussed the assessment and treatment plan with the patient. The patient was provided an opportunity to ask questions and all were answered. The patient agreed with the plan and demonstrated an understanding of the instructions.   The patient was advised to call back or seek an in-person evaluation if the symptoms worsen or if the condition fails to improve as anticipated.  Pt was provided 240 minutes of non-face-to-face time during this encounter.   Donia Guiles, LCSW   [facility] PHP THERAPIST PROGRESS NOTE  [name] [id number]  Session Time: 9:00 - 10:00  Participation Level: Active  Behavioral Response: CasualAlertDepressed  Type of Therapy: Group Therapy  Treatment Goals addressed: Coping  Progress Towards Goals: Progressing  Interventions: CBT, DBT, Supportive, and Reframing  Summary: Clinician led check-in regarding current stressors and situation, and review of patient completed daily inventory. Clinician utilized active listening and empathetic response and validated patient emotions. Clinician facilitated processing group on pertinent issues.?    Summary: [name] is a 26 y.o. female who presents with mood symptoms. Patient arrived within time allowed. Patient rates her mood at a 7 on a scale of 1-10 with 10 being best. Pt states she feels "okay." Pt reports back and forth feelings re: her recent breakup and feeling sad yesterday. Pt reports she attempted distractions however found it difficult  to focus. Pt  states AH was "loud" yesterday and she used music to manage. Pt states she slept well. Patient able to process. Patient engaged in discussion.         Session Time: 10:00 am - 11:00 am   Participation Level: Active   Behavioral Response: CasualAlertDepressed   Type of Therapy: Group Therapy   Treatment Goals addressed: Coping   Progress Towards Goals: Progressing   Interventions: CBT, DBT, Solution Focused, Strength-based, Supportive, and Reframing   Therapist Response: Cln led discussion on feelings and the role they play for our lives. Cln contextualized feelings as a warning system that brings attention to areas of our lives that need focus. Group members discussed how to pay attention to what the feeling is telling us versus reacting to unpleasant aspects of a feeling.    Therapist Response:  Pt engaged in discussion and reports understanding.            Session Time: 11:00 -12:00   Participation Level: Active   Behavioral Response: CasualAlertDepressed   Type of Therapy: Group Therapy, Occupational Therapy   Treatment Goals addressed: Coping   Progress Towards Goals: Progressing   Interventions: Supportive, Education   Summary:  Occupational Therapy group led by cln E. Hollan.   Therapist Response: See OT note         Session Time: 12:00 -1:00   Participation Level: Active   Behavioral Response: CasualAlertDepressed   Type of Therapy: Group therapy   Treatment Goals addressed: Coping   Progress Towards Goals: Progressing   Interventions: CBT; Solution focused; Supportive; Reframing   Summary: 12:00 - 12:50: Cln introduced CBT and the way in which it can provide context for addressing stumbling  blocks. Group discussed "the problem is not the problem, the problem is how we're thinking about the problem" and tried to change perspective on current struggles.  12:50 -1:00 Clinician led check-out. Clinician assessed for immediate needs,  medication compliance and efficacy, and safety concerns.   Therapist Response: 12:00 - 12:50: Pt engaged in discussion and is able to attempt reframing using CBT.  12:50 - 1:00 pm: At check-out, patient reports no immediate concerns. Patient demonstrates progress as evidenced by increased use of coping skills. Patient denies SI/HI/self-harm thoughts at the end of group.   Suicidal/Homicidal: Nowithout intent/plan  Plan: Pt will continue in PHP while working to decrease mood symptoms, stabilize AH, and increase ability to manage symptoms in a healthy manner.   Collaboration of Care: Medication Management AEB T Lewis  Patient/Guardian was advised Release of Information must be obtained prior to any record release in order to collaborate their care with an outside provider. Patient/Guardian was advised if they have not already done so to contact the registration department to sign all necessary forms in order for [name] to release information regarding their care.   Consent: Patient/Guardian gives verbal consent for treatment and assignment of benefits for services provided during this visit. Patient/Guardian expressed understanding and agreed to proceed.   Diagnosis: Schizoaffective disorder, bipolar type ([place]) [F25.0]    1. Schizoaffective disorder, bipolar type ([place])     Lorin Glass, LCSW

## 2022-05-12 NOTE — Psych (Signed)
Virtual Visit via Video Note  I connected with KHUSBU USSERY on 01/17/22 at  9:00 AM EDT by a video enabled telemedicine application and verified that I am speaking with the correct person using two identifiers.  Location: Patient: patient home Provider: clinical home office   I discussed the limitations of evaluation and management by telemedicine and the availability of in person appointments. The patient expressed understanding and agreed to proceed.  I discussed the assessment and treatment plan with the patient. The patient was provided an opportunity to ask questions and all were answered. The patient agreed with the plan and demonstrated an understanding of the instructions.   The patient was advised to call back or seek an in-person evaluation if the symptoms worsen or if the condition fails to improve as anticipated.  Pt was provided 240 minutes of non-face-to-face time during this encounter.   Lorin Glass, LCSW   [facility] PHP THERAPIST PROGRESS NOTE  UTHA FREDE [id number]  Session Time: 9:00 - 10:00  Participation Level: Active  Behavioral Response: CasualAlertDepressed  Type of Therapy: Group Therapy  Treatment Goals addressed: Coping  Progress Towards Goals: Progressing  Interventions: CBT, DBT, Supportive, and Reframing  Summary: Clinician led check-in regarding current stressors and situation, and review of patient completed daily inventory. Clinician utilized active listening and empathetic response and validated patient emotions. Clinician facilitated processing group on pertinent issues.?    Summary: CHELSIA SWALLOWS is a 26 y.o. female who presents with mood symptoms. Patient arrived within time allowed. Patient rates her mood at a 6 on a scale of 1-10 with 10 being best. Pt states she feels "okay." Pt reports her sister has been in the hospital and returned home yesterday. Pt reports financial stress and that her hot water is off. Pt reports she  was able to manage AH enough to sleep alone last night and got 8 hours of sleep. Pt identifies passive SI this morning before taking her medication due to [facility] and that is has subsided. Pt reports medication compliance.  Patient able to process. Patient engaged in discussion.         Session Time: 10:00 am - 11:00 am   Participation Level: Active   Behavioral Response: CasualAlertDepressed   Type of Therapy: Group Therapy   Treatment Goals addressed: Coping   Progress Towards Goals: Progressing   Interventions: CBT, DBT, Solution Focused, Strength-based, Supportive, and Reframing   Therapist Response: Cln led processing group for pt's current struggles. Group members shared stressors and provided support and feedback. Cln brought in topics of boundaries, healthy relationships, and unhealthy thought processes to inform discussion.   Therapist Response: Pt able to process and provide support to group.            Session Time: 11:00 -12:00   Participation Level: Active   Behavioral Response: CasualAlertDepressed   Type of Therapy: Group Therapy, Spiritual Care   Treatment Goals addressed: Coping   Progress Towards Goals: Progressing   Interventions: Supportive, Education   Summary:  Alain Marion, Chaplain, led group.   Therapist Response: Pt participated        Session Time: 12:00 -1:00   Participation Level: Active   Behavioral Response: CasualAlertDepressed   Type of Therapy: Group Therapy   Treatment Goals addressed: Coping   Progress Towards Goals: Progressing   Interventions: CBT, DBT, Solution Focused, Strength-based, Supportive, and Reframing   Summary: Cln introduced topic of DBT Self-Soothe skills. Group discussed ways they can utilize the five senses to  soothe themselves when struggling.  12:50 -1:00 Clinician led check-out. Clinician assessed for immediate needs, medication compliance and efficacy, and safety concerns   Therapist Response: 12:00 -  12:50: Pt engaged in discussion and is able to determine ways to utilize each of the five senses. 12:50 - 1:00 pm: At check-out, patient reports no immediate concerns. Patient demonstrates progress as evidenced by increased management of AH. Patient denies SI/HI/self-harm thoughts at the end of group.   Suicidal/Homicidal: Nowithout intent/plan  Plan: Pt will continue in PHP while working to decrease mood symptoms, stabilize AH, and increase ability to manage symptoms in a healthy manner.   Collaboration of Care: Medication Management AEB T Lewis  Patient/Guardian was advised Release of Information must be obtained prior to any record release in order to collaborate their care with an outside provider. Patient/Guardian was advised if they have not already done so to contact the registration department to sign all necessary forms in order for [name] to release information regarding their care.   Consent: Patient/Guardian gives verbal consent for treatment and assignment of benefits for services provided during this visit. Patient/Guardian expressed understanding and agreed to proceed.   Diagnosis: Schizoaffective disorder, bipolar type ([place]) [F25.0]    1. Schizoaffective disorder, bipolar type ([place])     Lorin Glass, LCSW

## 2022-05-12 NOTE — Psych (Signed)
Virtual Visit via Video Note  I connected with [name] on 01/25/22 at  9:00 AM EDT by a video enabled telemedicine application and verified that I am speaking with the correct person using two identifiers.  Location: Patient: patient home Provider: clinical home office   I discussed the limitations of evaluation and management by telemedicine and the availability of in person appointments. The patient expressed understanding and agreed to proceed.  I discussed the assessment and treatment plan with the patient. The patient was provided an opportunity to ask questions and all were answered. The patient agreed with the plan and demonstrated an understanding of the instructions.   The patient was advised to call back or seek an in-person evaluation if the symptoms worsen or if the condition fails to improve as anticipated.  Pt was provided 240 minutes of non-face-to-face time during this encounter.   Donia Guiles, LCSW   [facility] PHP THERAPIST PROGRESS NOTE  [name] [id number]  Session Time: 9:00 - 10:00  Participation Level: Active  Behavioral Response: CasualAlertDepressed  Type of Therapy: Group Therapy  Treatment Goals addressed: Coping  Progress Towards Goals: Progressing  Interventions: CBT, DBT, Supportive, and Reframing  Summary: Clinician led check-in regarding current stressors and situation, and review of patient completed daily inventory. Clinician utilized active listening and empathetic response and validated patient emotions. Clinician facilitated processing group on pertinent issues.?    Summary: [name] is a 26 y.o. female who presents with mood symptoms. Patient arrived within time allowed. Patient rates her mood at a 6 on a scale of 1-10 with 10 being best. Pt states feeling "okay." Pt reports spending time alone yesterday which increased AH. Pt states continued anxiety regarding her breakup and going back and forth with her  feelings. Pt struggles with consistency. Patient able to process. Patient engaged in discussion.         Session Time: 10:00 am - 11:00 am   Participation Level: Active   Behavioral Response: CasualAlertDepressed   Type of Therapy: Group Therapy   Treatment Goals addressed: Coping   Progress Towards Goals: Progressing   Interventions: CBT, DBT, Solution Focused, Strength-based, Supportive, and Reframing   Therapist Response: Cln led discussion on safety and the ways in which we can seek and own it in ourselves. Group members shared how they view safety and situations which hinder safety. Cln encouraged pt's to think about emotional safety and ways to foster it.     Therapist Response: Pt engaged in discussion and reports gaining insight.          Session Time: 11:00 -12:00   Participation Level: Active   Behavioral Response: CasualAlertDepressed   Type of Therapy: Group Therapy, Occupational Therapy   Treatment Goals addressed: Coping   Progress Towards Goals: Progressing   Interventions: Supportive, Education   Summary:  Occupational Therapy group led by cln E. Hollan.   Therapist Response: See OT note       Session Time: 12:00 -1:00   Participation Level: Active   Behavioral Response: CasualAlertDepressed   Type of Therapy: Group therapy   Treatment Goals addressed: Coping   Progress Towards Goals: Progressing   Interventions: CBT; Solution focused; Supportive; Reframing   Summary: 12:00 - 12:50: Cln continued topic of CBT cognitive distortions and introduced thought challenging as a way to  utilize the "challenge" C in C-C-C. Group utilized Administrator, Civil Service questions" as a way to introduce challenges and reframe distorted thinking. Group members worked through pt examples to  practice challenging distorted thinking.  12:50 -1:00 Clinician led check-out. Clinician assessed for immediate needs, medication compliance and efficacy, and safety concerns.    Therapist Response: 12:00 - 12:50: Pt engaged in discussion and demonstrates understanding of challenging distorted thoughts through practice.  12:50 - 1:00 pm: At check-out, patient reports no immediate concerns. Patient demonstrates progress as evidenced by future oriented thinking. Patient denies SI/HI/self-harm thoughts at the end of group.   Suicidal/Homicidal: Nowithout intent/plan  Plan: Pt will continue in PHP while working to decrease mood symptoms, stabilize AH, and increase ability to manage symptoms in a healthy manner.   Collaboration of Care: Medication Management AEB T Lewis  Patient/Guardian was advised Release of Information must be obtained prior to any record release in order to collaborate their care with an outside provider. Patient/Guardian was advised if they have not already done so to contact the registration department to sign all necessary forms in order for [name] to release information regarding their care.   Consent: Patient/Guardian gives verbal consent for treatment and assignment of benefits for services provided during this visit. Patient/Guardian expressed understanding and agreed to proceed.   Diagnosis: Schizoaffective disorder, bipolar type ([place]) [F25.0]    1. Schizoaffective disorder, bipolar type ([place])      Lorin Glass, LCSW

## 2022-05-21 ENCOUNTER — Other Ambulatory Visit (HOSPITAL_COMMUNITY)
Admission: RE | Admit: 2022-05-21 | Discharge: 2022-05-21 | Disposition: A | Discharge status: Home / Self Care | Payer: Medicare Other | Source: Ambulatory Visit | Attending: Family Medicine | Admitting: Family Medicine

## 2022-05-21 ENCOUNTER — Other Ambulatory Visit (HOSPITAL_COMMUNITY): Payer: Self-pay

## 2022-05-21 ENCOUNTER — Encounter: Payer: Self-pay | Admitting: Family Medicine

## 2022-05-21 ENCOUNTER — Ambulatory Visit (INDEPENDENT_AMBULATORY_CARE_PROVIDER_SITE_OTHER): Payer: Medicare Other | Admitting: Family Medicine

## 2022-05-21 VITALS — BP 118/62 | HR 109 | Temp 98.1°F | Ht 64.0 in | Wt 221.2 lb

## 2022-05-21 DIAGNOSIS — Z114 Encounter for screening for human immunodeficiency virus [HIV]: Secondary | ICD-10-CM | POA: Diagnosis not present

## 2022-05-21 DIAGNOSIS — R5383 Other fatigue: Secondary | ICD-10-CM | POA: Diagnosis not present

## 2022-05-21 DIAGNOSIS — Z7251 High risk heterosexual behavior: Secondary | ICD-10-CM

## 2022-05-21 DIAGNOSIS — E559 Vitamin D deficiency, unspecified: Secondary | ICD-10-CM | POA: Diagnosis not present

## 2022-05-21 NOTE — Progress Notes (Signed)
Chief Complaint  Patient presents with   Dizziness    Weakness Sleeping more than usual Check STD    Subjective: Patient is a 26 y.o. female here for fatigue.  She is here with her mother.  Very sleepy over past 3 mo. Mom has issues with thyroid disorders. She does not snore at night. Mood is on the side of depression. No recent med changes. She takes Vit D otc daily. She gets around 7-8 hrs of sleep nightly. She does not exercise currently. Diet is OK, she has gained some weight back, she is currently unemployed.  She is on lorazepam and Seroquel 100 mg daily for mood disorder, followed by psychiatry.  Of note, she had unprotected intercourse with her boyfriend. She would like to be checked to see if she is pregnant. The patient would also like to be screened for STDs.  She has a known history of genital herpes.  Past Medical History:  Diagnosis Date   Acute vaginitis 05/20/2019   ADD (attention deficit disorder) 04/24/2011   ADHD (attention deficit hyperactivity disorder)    Allergy    Anxiety    Benzodiazepine dependence ([place]) 05/12/2020   Bipolar disorder ([place])    Bipolar disorder, current episode manic severe with psychotic features ([place])    Bipolar I disorder ([place]) 05/12/2020   Constipation 04/03/2018   Delirium tremens ([place]) 05/20/2019   Depression    Eczema 12/11/13   Genital herpes simplex 10/24/2017   High risk heterosexual behavior 05/20/2019   MDD (major depressive disorder), recurrent severe, without psychosis ([place]) 02/11/2018   Morbid obesity ([place]) 09/06/2019   Pelvic pain 01/31/2018   Possible pregnancy 09/06/2019   Preventative health care 03/16/2013   Schizo-affective schizophrenia ([place])    Schizoaffective disorder ([place]) 03/08/2018   Seizure ([place]) 05/20/2019   Severe bipolar I disorder, current or most recent episode depressed ([place]) 03/30/2017   STD exposure 05/13/2015   Syncope 03/03/2017   Vitamin D deficiency 03/10/2019    Objective: BP 118/62 (BP Location: Left Arm,  Patient Position: Sitting, Cuff Size: Normal)   Pulse (!) 109   Temp 98.1 F (36.7 C) (Oral)   Ht 5\' 4"  (1.626 m)   Wt 221 lb 4 oz (100.4 kg)   SpO2 98%   BMI 37.98 kg/m  General: Awake, appears stated age Heart: tachycardic, reg rhythm, no LE edema Lungs: CTAB, no rales, wheezes or rhonchi. No accessory muscle use Abdomen: Bowel sounds present, soft, nontender, nondistended Neck: No obvious asymmetry or masses Psych: Age appropriate judgment and insight, normal affect and mood  Assessment and Plan: Other fatigue - Plan: CBC, Comprehensive metabolic panel, TSH, T4, free  Unprotected sex - Plan: Cervicovaginal ancillary only( [place]), Pregnancy, urine  Screening for HIV (human immunodeficiency virus) - Plan: HIV Antibody (routine testing w rflx)  Vitamin D insufficiency - Plan: VITAMIN D 25 Hydroxy (Vit-D Deficiency, Fractures)  New problem with uncertain prognosis.  We will check above labs to rule out metabolic causes.  She is on medications from the psychiatry team that could contribute to these symptoms.  Also uncontrolled mood disorders could contribute.  It is unlikely she has sleep apnea based off of her history.  If the above lab work-up is unremarkable, she will reach out to her psychiatry team to develop a plan regarding potential side effects or uncontrolled symptoms. We will screen for HIV and STIs in addition to checking her for pregnancy. The patient and her mother voiced understanding and agreement to the plan.  [employer]  Nolene Ebbs, DO 05/21/22  4:54 PM

## 2022-05-21 NOTE — Patient Instructions (Addendum)
Give [name] 2-3 business days to get the results of your labs back.   Keep the diet clean and stay active.  Get between 7-9 hrs of sleep.   Let us know if you need anything.

## 2022-05-22 LAB — COMPREHENSIVE METABOLIC PANEL
ALT: 10 U/L (ref 0–35)
AST: 11 U/L (ref 0–37)
Albumin: 3.8 g/dL (ref 3.5–5.2)
Alkaline Phosphatase: 57 U/L (ref 39–117)
BUN: 10 mg/dL (ref 6–23)
CO2: 22 mEq/L (ref 19–32)
Calcium: 8.8 mg/dL (ref 8.4–10.5)
Chloride: 103 mEq/L (ref 96–112)
Creatinine, Ser: 0.7 mg/dL (ref 0.40–1.20)
GFR: 117.8 mL/min (ref >60.00)
Glucose, Bld: 93 mg/dL (ref 70–99)
Potassium: 4.3 mEq/L (ref 3.5–5.1)
Sodium: 135 mEq/L (ref 135–145)
Total Bilirubin: 0.4 mg/dL (ref 0.2–1.2)
Total Protein: 6.7 g/dL (ref 6.0–8.3)

## 2022-05-22 LAB — PREGNANCY, URINE: Preg Test, Ur: NEGATIVE

## 2022-05-22 LAB — CBC
HCT: 37.2 % (ref 36.0–46.0)
Hemoglobin: 12.4 g/dL (ref 12.0–15.0)
MCHC: 33.4 g/dL (ref 30.0–36.0)
MCV: 89.7 fl (ref 78.0–100.0)
Platelets: 338 10*3/uL (ref 150.0–400.0)
RBC: 4.14 Mil/uL (ref 3.87–5.11)
RDW: 14.3 % (ref 11.5–15.5)
WBC: 7.8 10*3/uL (ref 4.0–10.5)

## 2022-05-22 LAB — TSH: TSH: 0.23 u[IU]/mL — ABNORMAL LOW (ref 0.35–5.50)

## 2022-05-22 LAB — T4, FREE: Free T4: 0.93 ng/dL (ref 0.60–1.60)

## 2022-05-22 LAB — VITAMIN D 25 HYDROXY (VIT D DEFICIENCY, FRACTURES): VITD: 43.31 ng/mL (ref 30.00–100.00)

## 2022-05-22 LAB — HIV ANTIBODY (ROUTINE TESTING W REFLEX): HIV 1&2 Ab, 4th Generation: NONREACTIVE

## 2022-05-23 LAB — CERVICOVAGINAL ANCILLARY ONLY
Chlamydia: NEGATIVE
Comment: NEGATIVE
Comment: NEGATIVE
Comment: NORMAL
Neisseria Gonorrhea: NEGATIVE
Trichomonas: NEGATIVE

## 2022-05-29 ENCOUNTER — Other Ambulatory Visit (HOSPITAL_COMMUNITY): Payer: Self-pay

## 2022-05-29 ENCOUNTER — Other Ambulatory Visit: Payer: Self-pay | Admitting: Family Medicine

## 2022-05-29 DIAGNOSIS — Z3009 Encounter for other general counseling and advice on contraception: Secondary | ICD-10-CM

## 2022-05-29 MED ORDER — NORGESTIMATE-ETH ESTRADIOL 0.25-35 MG-MCG PO TABS
1.0000 | ORAL_TABLET | Freq: Every day | ORAL | 0 refills | Status: DC
  Filled 2022-05-29 – 2022-07-10 (×3): qty 84, 84d supply, fill #0

## 2022-06-04 ENCOUNTER — Other Ambulatory Visit (HOSPITAL_COMMUNITY): Payer: Self-pay

## 2022-06-05 ENCOUNTER — Other Ambulatory Visit (HOSPITAL_COMMUNITY): Payer: Self-pay

## 2022-06-06 ENCOUNTER — Other Ambulatory Visit (HOSPITAL_BASED_OUTPATIENT_CLINIC_OR_DEPARTMENT_OTHER): Payer: Self-pay

## 2022-06-06 ENCOUNTER — Ambulatory Visit (INDEPENDENT_AMBULATORY_CARE_PROVIDER_SITE_OTHER): Payer: Medicare Other | Admitting: Medical

## 2022-06-06 ENCOUNTER — Other Ambulatory Visit (HOSPITAL_COMMUNITY): Payer: Self-pay

## 2022-06-06 ENCOUNTER — Other Ambulatory Visit (HOSPITAL_COMMUNITY)
Admission: RE | Admit: 2022-06-06 | Discharge: 2022-06-06 | Disposition: A | Discharge status: Home / Self Care | Payer: Medicare Other | Source: Ambulatory Visit | Attending: Medical | Admitting: Medical

## 2022-06-06 VITALS — BP 130/80 | HR 90 | Resp 18 | Ht 64.0 in | Wt 223.6 lb

## 2022-06-06 DIAGNOSIS — Z113 Encounter for screening for infections with a predominantly sexual mode of transmission: Secondary | ICD-10-CM

## 2022-06-06 DIAGNOSIS — N898 Other specified noninflammatory disorders of vagina: Secondary | ICD-10-CM | POA: Diagnosis present

## 2022-06-06 DIAGNOSIS — H109 Unspecified conjunctivitis: Secondary | ICD-10-CM

## 2022-06-06 MED ORDER — TOBRAMYCIN 0.3 % OP SOLN
2.0000 [drp] | Freq: Four times a day (QID) | OPHTHALMIC | 0 refills | Status: DC
  Filled 2022-06-06 (×2): qty 5, 13d supply, fill #0

## 2022-06-06 NOTE — Progress Notes (Signed)
Subjective:    Patient ID: [name], female    DOB: [birth year], 26 y.o.   MRN: [id number]  HPI  Pt in with rt eye irritation. Pt states on Sunday eye was watery. That day cleaned her face with baby wipe to take off make up.  On Sunday both upper and her lower lid was swollen. She never had any colored dc.   No blister eruption around her eyes.   Pt states her vision is normal.    On discussion. States has type 1 and type II herpes.  No recent outbreak.   Pt also state she has vaginal odor on monday. She had this after some hand foreplay with boyfriend. Pt states she is using condoms when have sex. No itching or white dc.    Review of Systems  Constitutional:  Negative for chills, fatigue and fever.  Eyes:  Negative for pain and redness.       See hpi  Respiratory:  Negative for choking, chest tightness, shortness of breath and wheezing.   Cardiovascular:  Negative for chest pain and palpitations.  Gastrointestinal:  Negative for abdominal pain.  Musculoskeletal:  Negative for back pain, joint swelling and neck pain.  Skin:  Negative for rash.  Neurological:  Negative for dizziness, seizures and headaches.  Hematological:  Negative for adenopathy. Does not bruise/bleed easily.    Past Medical History:  Diagnosis Date   Acute vaginitis 05/20/2019   ADD (attention deficit disorder) 04/24/2011   ADHD (attention deficit hyperactivity disorder)    Allergy    Anxiety    Benzodiazepine dependence ([facility]) 05/12/2020   Bipolar disorder ([facility])    Bipolar disorder, current episode manic severe with psychotic features ([facility])    Bipolar I disorder ([facility]) 05/12/2020   Constipation 04/03/2018   Delirium tremens ([facility]) 05/20/2019   Depression    Eczema 12/11/13   Genital herpes simplex 10/24/2017   High risk heterosexual behavior 05/20/2019   MDD (major depressive disorder), recurrent severe, without psychosis ([facility]) 02/11/2018   Morbid obesity ([facility]) 09/06/2019   Pelvic pain 01/31/2018    Possible pregnancy 09/06/2019   Preventative health care 03/16/2013   Schizo-affective schizophrenia ([facility])    Schizoaffective disorder ([facility]) 03/08/2018   Seizure ([facility]) 05/20/2019   Severe bipolar I disorder, current or most recent episode depressed ([facility]) 03/30/2017   STD exposure 05/13/2015   Syncope 03/03/2017   Vitamin D deficiency 03/10/2019     Social History   Socioeconomic History   Marital status: Single    Spouse name: Not on file   Number of children: 0   Years of education: Not on file   Highest education level: Associate degree: academic program  Occupational History   Occupation: Unemployed  Tobacco Use   Smoking status: Never   Smokeless tobacco: Never  Vaping Use   Vaping Use: Former   Quit date: 09/01/2017  Substance and Sexual Activity   Alcohol use: Not Currently    Comment: occasionally   Drug use: Not Currently   Sexual activity: Not Currently    Birth control/protection: Pill  Other Topics Concern   Not on file  Social History Narrative   Lives with mom in an apartment on the second floor.  No children.  Currently not working.  Education: college. Left handed   Social Determinants of Health   Financial Resource Strain: Not on file  Food Insecurity: Not on file  Transportation Needs: Not on file  Physical Activity: Not on file  Stress: Not on  file  Social Connections: Not on file  Intimate Partner Violence: Not on file    No past surgical history on file.  Family History  Problem Relation Age of Onset   Asthma Mother    Depression Father    Stroke Father    Heart disease Father    Asthma Father    Hypertension Father        pulmonary hypertension   Heart disease Maternal Uncle    Leukemia Maternal Uncle    Diabetes Maternal Grandfather    Hypertension Maternal Grandmother    COPD Maternal Grandmother    Diabetes Paternal Grandfather    Stroke Paternal Grandmother    Diabetes Paternal Grandmother     Allergies  Allergen Reactions    Concerta [Methylphenidate] Other (See Comments)    hallucinations   Terbinafine Other (See Comments)   Trazodone And Nefazodone Other (See Comments)    Pt stated she has nightmares   Clindamycin Hcl Rash   Haldol [Haloperidol] Anxiety    Unable to seat still   Terbinafine Hcl Nausea And Vomiting    Current Outpatient Medications on File Prior to Visit  Medication Sig Dispense Refill   azelastine (ASTELIN) 0.1 % nasal spray Place 2 sprays into both nostrils daily as needed for rhinitis or allergies as directed 30 mL 12   buPROPion (WELLBUTRIN XL) 150 MG 24 hr tablet Take 1 tablet by mouth every morning 90 tablet 4   famotidine (PEPCID) 20 MG tablet Take 1 tablet (20 mg total) by mouth 2 (two) times daily. (Patient taking differently: Take 20 mg by mouth as needed.) 60 tablet 2   fluticasone (FLONASE) 50 MCG/ACT nasal spray PLACE 2 SPRAYS INTO EACH NOSTRIL EVERY DAY 16 g 1   levocetirizine (XYZAL) 5 MG tablet Take 1 tablet (5 mg total) by mouth every evening. 90 tablet 0   LORazepam (ATIVAN) 1 MG tablet TAKE 1 TABLET BY MOUTH 4 TIMES DAILY. 360 tablet 0   norgestimate-ethinyl estradiol (VYLIBRA) 0.25-35 MG-MCG tablet Take 1 tablet by mouth daily. 84 tablet 0   Paliperidone Palmitate ER (INVEGA HAFYERA) 1560 MG/5ML SUSY Inject 1 syringe intramuscularly every 6 months 5 mL 1   propranolol (INDERAL) 10 MG tablet Take 1/2 tablet (5 mg total) by mouth daily. 45 tablet 3   QUEtiapine (SEROQUEL) 100 MG tablet Take 1 tablet (100 mg total) by mouth at bedtime. 90 tablet 4   triamcinolone (KENALOG) 0.1 %      valACYclovir (VALTREX) 1000 MG tablet Take 1 tablet (1,000 mg total) by mouth daily. (Patient taking differently: Take 1,000 mg by mouth as needed.) 30 tablet 5   [DISCONTINUED] metoprolol tartrate (LOPRESSOR) 25 MG tablet 1/2 tab po bid (Patient not taking: Reported on 06/08/2020) 30 tablet 0   [DISCONTINUED] OLANZapine zydis (ZYPREXA) 20 MG disintegrating tablet Take 1 tablet (20 mg total) by  mouth at bedtime. (Patient not taking: Reported on 06/08/2020) 30 tablet 0   [DISCONTINUED] paliperidone (INVEGA) 9 MG 24 hr tablet Take 1 tablet (9 mg total) by mouth at bedtime. (Patient not taking: Reported on 06/08/2020) 30 tablet 0   No current facility-administered medications on file prior to visit.    BP 130/80   Pulse 90   Resp 18   Ht 5\' 4"  (1.626 m)   Wt 223 lb 9.6 oz (101.4 kg)   SpO2 95%   BMI 38.38 kg/m         Objective:   Physical Exam  General- No acute distress. Pleasant patient.  Neck- Full range of motion, no jvd Lungs- Clear, even and unlabored. Heart- regular rate and rhythm. Neurologic- CNII- XII grossly intact.  Eyes- perrl, conjunctiva clear. No dc. Rt upper eye lid. On close inspection and palpation possible small stye mid portion of lid.     Assessment & Plan:   Patient Instructions  Recent rt eye conjunctivitis with reported significant upper and lower lid swelling. No much better. Overall improved by description and exam. You feel like upper lid mild puffy still. On exam palpation possible small stye. Can use tobrex eye drops and warm compress twice a day for 2 more days.  Hx of hsv 1 and hsv 2. Use your valtrex if any outbreak. Also if any vesicle outbreak around eye start.  For brief vagnal odor and possible std exposure ordered urine ancillary studies.  Follow up date to be determined after lab review.   Esperanza Richters, PA-C

## 2022-06-06 NOTE — Patient Instructions (Addendum)
Recent rt eye conjunctivitis with reported significant upper and lower lid swelling. No much better. Overall improved by description and exam. You feel like upper lid mild puffy still. On exam palpation possible small stye. Can use tobrex eye drops and warm compress twice a day for 2 more days.  Hx of hsv 1 and hsv 2. Use your valtrex if any outbreak. Also if any vesicle outbreak around eye start.  For brief vaginal odor and possible std exposure ordered urine ancillary studies. Rpr lab as well.  Follow up date to be determined after lab review.

## 2022-06-07 LAB — RPR: RPR Ser Ql: NONREACTIVE

## 2022-06-07 LAB — URINE CYTOLOGY ANCILLARY ONLY
Bacterial Vaginitis-Urine: NEGATIVE
Candida Urine: NEGATIVE
Chlamydia: NEGATIVE
Comment: NEGATIVE
Comment: NEGATIVE
Comment: NORMAL
Neisseria Gonorrhea: NEGATIVE
Trichomonas: NEGATIVE

## 2022-06-10 ENCOUNTER — Encounter: Payer: Self-pay | Admitting: Medical

## 2022-06-11 ENCOUNTER — Other Ambulatory Visit (HOSPITAL_BASED_OUTPATIENT_CLINIC_OR_DEPARTMENT_OTHER): Payer: Self-pay

## 2022-06-11 MED ORDER — FLUCONAZOLE 150 MG PO TABS
150.0000 mg | ORAL_TABLET | Freq: Every day | ORAL | 0 refills | Status: DC
  Filled 2022-06-11: qty 1, 1d supply, fill #0

## 2022-06-11 NOTE — Addendum Note (Signed)
Addended by: Anabel Halon on: 06/11/2022 12:48 PM   Modules accepted: Orders

## 2022-07-06 ENCOUNTER — Other Ambulatory Visit (HOSPITAL_COMMUNITY): Payer: Self-pay

## 2022-07-06 ENCOUNTER — Other Ambulatory Visit: Payer: Self-pay | Admitting: Family Medicine

## 2022-07-07 ENCOUNTER — Other Ambulatory Visit (HOSPITAL_COMMUNITY): Payer: Self-pay

## 2022-07-07 MED ORDER — LEVOCETIRIZINE DIHYDROCHLORIDE 5 MG PO TABS
5.0000 mg | ORAL_TABLET | Freq: Every evening | ORAL | 0 refills | Status: DC
  Filled 2022-07-07: qty 90, 90d supply, fill #0

## 2022-07-09 ENCOUNTER — Other Ambulatory Visit (HOSPITAL_COMMUNITY): Payer: Self-pay

## 2022-07-09 MED ORDER — LORAZEPAM 1 MG PO TABS
1.0000 mg | ORAL_TABLET | Freq: Three times a day (TID) | ORAL | 0 refills | Status: DC
  Filled 2022-07-09: qty 90, 30d supply, fill #0
  Filled 2022-08-07: qty 90, 30d supply, fill #1

## 2022-07-10 ENCOUNTER — Other Ambulatory Visit (HOSPITAL_COMMUNITY): Payer: Self-pay

## 2022-07-10 ENCOUNTER — Telehealth: Payer: Self-pay | Admitting: Neurology

## 2022-07-10 NOTE — Telephone Encounter (Signed)
Pt c/o: seizure Missed medications?  Yes.   The lorazepam She could not get it on Friday they just got it yesterday Sleep deprived?  No. Alcohol intake?  No. Increased stress? Yes.   Mental health issues she was over whelmed  Any change in medication color or shape? No. Any trigger? No she can't remember she was watching TV  Back to their usual baseline self?  Yes.  . If no, advise go to ER Current medications prescribed by Dr. Karel Jarvis: none  She is going to PCP tomorrow.

## 2022-07-10 NOTE — Telephone Encounter (Signed)
She ran out Friday was out the weekend they picked up her medication on Monday

## 2022-07-10 NOTE — Telephone Encounter (Signed)
Pt's mother called in stating the pt had a seizure yesterday. She bit her tongue really bad. Due to the holiday she didn't have her refill of the lorazepam, so she thinks that may have been why she had the seizure.

## 2022-07-10 NOTE — Telephone Encounter (Signed)
She has seizures when she suddenly stops the lorazepam. How many days was she off lorazepam? In the future, make sure she calls for refills at least 1 week before she runs out. Thanks

## 2022-07-11 ENCOUNTER — Ambulatory Visit (INDEPENDENT_AMBULATORY_CARE_PROVIDER_SITE_OTHER): Payer: Medicare Other

## 2022-07-11 ENCOUNTER — Ambulatory Visit (INDEPENDENT_AMBULATORY_CARE_PROVIDER_SITE_OTHER): Payer: Medicare Other | Admitting: Medical

## 2022-07-11 ENCOUNTER — Other Ambulatory Visit (HOSPITAL_BASED_OUTPATIENT_CLINIC_OR_DEPARTMENT_OTHER): Payer: Self-pay

## 2022-07-11 VITALS — BP 117/70 | HR 80 | Temp 98.2°F | Resp 18 | Ht 64.0 in | Wt 228.0 lb

## 2022-07-11 VITALS — Wt 223.0 lb

## 2022-07-11 DIAGNOSIS — G40909 Epilepsy, unspecified, not intractable, without status epilepticus: Secondary | ICD-10-CM

## 2022-07-11 DIAGNOSIS — Z0000 Encounter for general adult medical examination without abnormal findings: Secondary | ICD-10-CM

## 2022-07-11 DIAGNOSIS — F259 Schizoaffective disorder, unspecified: Secondary | ICD-10-CM | POA: Diagnosis not present

## 2022-07-11 DIAGNOSIS — F1320 Sedative, hypnotic or anxiolytic dependence, uncomplicated: Secondary | ICD-10-CM | POA: Diagnosis not present

## 2022-07-11 MED ORDER — IBUPROFEN 600 MG PO TABS
600.0000 mg | ORAL_TABLET | Freq: Three times a day (TID) | ORAL | 0 refills | Status: DC | PRN
  Filled 2022-07-11: qty 15, 5d supply, fill #0

## 2022-07-11 MED ORDER — NYSTATIN 100000 UNIT/ML MT SUSP
OROMUCOSAL | 0 refills | Status: DC
  Filled 2022-07-11: qty 201, 10d supply, fill #0

## 2022-07-11 NOTE — Progress Notes (Addendum)
I connected with  GREGORY DOWE on 07/11/22 by a audio enabled telemedicine application and verified that I am speaking with the correct person using two identifiers.  Patient Location: Home  Provider Location: Office/Clinic  I discussed the limitations of evaluation and management by telemedicine. The patient expressed understanding and agreed to proceed.   Subjective:   [name] is a 26 y.o. female who presents for Medicare Annual (Subsequent) preventive examination.  Review of Systems     Cardiac Risk Factors include: obesity (BMI >30kg/m2)     Objective:    Today's Vitals   07/11/22 0749  Weight: 223 lb (101.2 kg)   Body mass index is 38.28 kg/m.     07/11/2022    7:56 AM 01/04/2022    8:47 PM 06/26/2021   12:36 PM 06/23/2021    9:31 AM 01/06/2021    2:36 PM 07/02/2020    1:41 PM 05/12/2020   12:14 PM  Advanced Directives  Does Patient Have a Medical Advance Directive? No  No Yes No    Type of Advance Directive         Copy of Healthcare Power of Attorney in Chart?         Would patient like information on creating a medical advance directive? No - Patient declined  No - Guardian declined         Information is confidential and restricted. Go to Review Flowsheets to unlock data.    Current Medications (verified) Outpatient Encounter Medications as of 07/11/2022  Medication Sig   azelastine (ASTELIN) 0.1 % nasal spray Place 2 sprays into both nostrils daily as needed for rhinitis or allergies as directed   buPROPion (WELLBUTRIN XL) 150 MG 24 hr tablet Take 1 tablet by mouth every morning   famotidine (PEPCID) 20 MG tablet Take 1 tablet (20 mg total) by mouth 2 (two) times daily. (Patient taking differently: Take 20 mg by mouth as needed.)   fluticasone (FLONASE) 50 MCG/ACT nasal spray PLACE 2 SPRAYS INTO EACH NOSTRIL EVERY DAY   levocetirizine (XYZAL) 5 MG tablet Take 1 tablet (5 mg total) by mouth every evening.   LORazepam (ATIVAN) 1 MG tablet Take 1  tablet (1 mg total) by mouth 3 (three) times daily. (Note: change in directions)   norgestimate-ethinyl estradiol (VYLIBRA) 0.25-35 MG-MCG tablet Take 1 tablet by mouth daily.   Paliperidone Palmitate ER (INVEGA HAFYERA) 1560 MG/5ML SUSY Inject 1 syringe intramuscularly every 6 months   propranolol (INDERAL) 10 MG tablet Take 1/2 tablet (5 mg total) by mouth daily.   QUEtiapine (SEROQUEL) 100 MG tablet Take 1 tablet (100 mg total) by mouth at bedtime.   triamcinolone (KENALOG) 0.1 %    LORazepam (ATIVAN) 1 MG tablet TAKE 1 TABLET BY MOUTH 4 TIMES DAILY. (Patient not taking: Reported on 07/11/2022)   valACYclovir (VALTREX) 1000 MG tablet Take 1 tablet (1,000 mg total) by mouth daily. (Patient not taking: Reported on 07/11/2022)   [DISCONTINUED] fluconazole (DIFLUCAN) 150 MG tablet Take 1 tablet (150 mg total) by mouth daily.   [DISCONTINUED] metoprolol tartrate (LOPRESSOR) 25 MG tablet 1/2 tab po bid (Patient not taking: Reported on 06/08/2020)   [DISCONTINUED] OLANZapine zydis (ZYPREXA) 20 MG disintegrating tablet Take 1 tablet (20 mg total) by mouth at bedtime. (Patient not taking: Reported on 06/08/2020)   [DISCONTINUED] paliperidone (INVEGA) 9 MG 24 hr tablet Take 1 tablet (9 mg total) by mouth at bedtime. (Patient not taking: Reported on 06/08/2020)   [DISCONTINUED] tobramycin (TOBREX) 0.3 % ophthalmic  solution Place 2 drops into the right eye every 6 (six) hours.   No facility-administered encounter medications on file as of 07/11/2022.    Allergies (verified) Concerta [methylphenidate], Terbinafine, Trazodone and nefazodone, Clindamycin hcl, Haldol [haloperidol], and Terbinafine hcl   History: Past Medical History:  Diagnosis Date   Acute vaginitis 05/20/2019   ADD (attention deficit disorder) 04/24/2011   ADHD (attention deficit hyperactivity disorder)    Allergy    Anxiety    Benzodiazepine dependence (HCC) 05/12/2020   Bipolar disorder (HCC)    Bipolar disorder, current episode  manic severe with psychotic features (HCC)    Bipolar I disorder (HCC) 05/12/2020   Constipation 04/03/2018   Delirium tremens (HCC) 05/20/2019   Depression    Eczema 12/11/13   Genital herpes simplex 10/24/2017   High risk heterosexual behavior 05/20/2019   MDD (major depressive disorder), recurrent severe, without psychosis (HCC) 02/11/2018   Morbid obesity (HCC) 09/06/2019   Pelvic pain 01/31/2018   Possible pregnancy 09/06/2019   Preventative health care 03/16/2013   Schizo-affective schizophrenia (HCC)    Schizoaffective disorder (HCC) 03/08/2018   Seizure (HCC) 05/20/2019   Severe bipolar I disorder, current or most recent episode depressed (HCC) 03/30/2017   STD exposure 05/13/2015   Syncope 03/03/2017   Vitamin D deficiency 03/10/2019   History reviewed. No pertinent surgical history. Family History  Problem Relation Age of Onset   Asthma Mother    Depression Father    Stroke Father    Heart disease Father    Asthma Father    Hypertension Father        pulmonary hypertension   Heart disease Maternal Uncle    Leukemia Maternal Uncle    Diabetes Maternal Grandfather    Hypertension Maternal Grandmother    COPD Maternal Grandmother    Diabetes Paternal Grandfather    Stroke Paternal Grandmother    Diabetes Paternal Grandmother    Social History   Socioeconomic History   Marital status: Single    Spouse name: Not on file   Number of children: 0   Years of education: Not on file   Highest education level: Associate degree: academic program  Occupational History   Occupation: Unemployed  Tobacco Use   Smoking status: Never   Smokeless tobacco: Never  Vaping Use   Vaping Use: Former   Quit date: 09/01/2017  Substance and Sexual Activity   Alcohol use: Not Currently    Comment: occasionally   Drug use: Not Currently   Sexual activity: Not Currently    Birth control/protection: Pill  Other Topics Concern   Not on file  Social History Narrative   Lives with mom in an  apartment on the second floor.  No children.  Currently not working.  Education: college. Left handed   Social Determinants of Health   Financial Resource Strain: Low Risk  (07/11/2022)   Overall Financial Resource Strain (CARDIA)    Difficulty of Paying Living Expenses: Not hard at all  Food Insecurity: No Food Insecurity (07/11/2022)   Hunger Vital Sign    Worried About Running Out of Food in the Last Year: Never true    Ran Out of Food in the Last Year: Never true  Transportation Needs: No Transportation Needs (07/11/2022)   PRAPARE - Administrator, Civil ServiceTransportation    Lack of Transportation (Medical): No    Lack of Transportation (Non-Medical): No  Physical Activity: Inactive (07/11/2022)   Exercise Vital Sign    Days of Exercise per Week: 0 days  Minutes of Exercise per Session: 0 min  Stress: No Stress Concern Present (07/11/2022)   [employer] of Occupational Health - Occupational Stress Questionnaire    Feeling of Stress : Only a little  Social Connections: Moderately Isolated (07/11/2022)   Social Connection and Isolation Panel [NHANES]    Frequency of Communication with Friends and Family: More than three times a week    Frequency of Social Gatherings with Friends and Family: More than three times a week    Attends Religious Services: 1 to 4 times per year    Active Member of [employer] or Organizations: No    Attends Engineer, structural: Never    Marital Status: Never married    Tobacco Counseling Counseling given: Not Answered   Clinical Intake:  Pre-visit preparation completed: Yes  Pain : No/denies pain     BMI - recorded: 38.28 Nutritional Status: BMI > 30  Obese Nutritional Risks: None Diabetes: No  How often do you need to have someone help you when you read instructions, pamphlets, or other written materials from your doctor or pharmacy?: 1 - Never  Diabetic?no  Interpreter Needed?: No  Information entered by :: Lanier Ensign, LPN   Activities  of Daily Living    07/11/2022    7:57 AM  In your present state of health, do you have any difficulty performing the following activities:  Hearing? 0  Vision? 0  Difficulty concentrating or making decisions? 0  Walking or climbing stairs? 0  Dressing or bathing? 0  Doing errands, shopping? 0  Preparing Food and eating ? N  Using the Toilet? N  In the past six months, have you accidently leaked urine? N  Do you have problems with loss of bowel control? N  Managing your Medications? N  Managing your Finances? N  Housekeeping or managing your Housekeeping? N    Patient Care Team: Zola Button, Grayling Congress, DO as PCP - General (Family Medicine) Thomasene Ripple, DO as PCP - Cardiology (Cardiology) Van Clines, MD as Consulting Physician (Neurology) Milagros Evener, MD as Consulting Physician (Psychiatry)  Indicate any recent Medical Services you may have received from other than [facility] providers in the past year (date may be approximate).     Assessment:   This is a routine wellness examination for [facility].  Hearing/Vision screen Hearing Screening - Comments:: Pt denies any hearing issues  Vision Screening - Comments:: Pt follows up with dr Joseph Art for annual eye exams   Dietary issues and exercise activities discussed: Current Exercise Habits: The patient does not participate in regular exercise at present   Goals Addressed             This Visit's Progress    Patient Stated       To get off medications        Depression Screen    07/11/2022    7:53 AM 01/04/2022   11:36 AM 12/28/2021    1:20 PM 08/03/2021   10:48 AM 09/30/2018    2:47 PM 11/02/2016    1:49 PM 08/03/2016   11:20 AM  PHQ 2/9 Scores  PHQ - 2 Score 2   0 0 0 0  PHQ- 9 Score 6           Information is confidential and restricted. Go to Review Flowsheets to unlock data.    Fall Risk    07/11/2022    7:57 AM 08/03/2021   10:48 AM 06/23/2021    9:31 AM 01/06/2021  2:36 PM 11/01/2020   11:26 AM   Fall Risk   Falls in the past year? 0 0 0 0 1  Number falls in past yr: 0 0 0 0 1  Injury with Fall? 0 0 0 0 0  Risk for fall due to : Impaired vision No Fall Risks     Follow up Falls prevention discussed Falls evaluation completed       FALL RISK PREVENTION PERTAINING TO THE HOME:  Any stairs in or around the home? Yes  If so, are there any without handrails? No  Home free of loose throw rugs in walkways, pet beds, electrical cords, etc? Yes  Adequate lighting in your home to reduce risk of falls? Yes   ASSISTIVE DEVICES UTILIZED TO PREVENT FALLS:  Life alert? No  Use of a cane, walker or w/c? No  Grab bars in the bathroom? No  Shower chair or bench in shower? No  Elevated toilet seat or a handicapped toilet? No   TIMED UP AND GO:  Was the test performed? No .   Cognitive Function:    04/18/2018    3:00 PM  MMSE - Mini Mental State Exam  Orientation to time 5  Orientation to Place 5  Registration 3  Attention/ Calculation 4  Recall 2  Language- name 2 objects 2  Language- repeat 1  Language- follow 3 step command 3  Language- read & follow direction 1  Write a sentence 1  Copy design 1  Total score 28        07/11/2022    7:58 AM  6CIT Screen  What Year? 0 points  What month? 0 points  What time? 0 points  Count back from 20 0 points  Months in reverse 0 points  Repeat phrase 0 points  Total Score 0 points    Immunizations Immunization History  Administered Date(s) Administered   DTaP 04/03/1994, 05/15/1994, 09/20/1994   HIB (PRP-OMP) 04/03/1994, 05/15/1994, 09/20/1994   HPV Quadrivalent 03/02/2008, 03/29/2010, 03/16/2013   Hepatitis A 02/04/2006   Hepatitis A, Adult 08/02/2014   Hepatitis B 1993/11/17, 04/03/1994, 09/20/1994   IPV 04/03/1994, 05/15/1994, 09/20/1994   Influenza Split 06/30/2012   Influenza,inj,Quad PF,6+ Mos 06/22/2014, 05/13/2015, 04/04/2018, 05/19/2019, 06/08/2020   Influenza-Unspecified 04/12/2016, 04/04/2018    Meningococcal B, OMV 11/02/2016   Meningococcal Conjugate 03/02/2008, 03/16/2013   PFIZER Comirnaty(Gray Top)Covid-19 Tri-Sucrose Vaccine 11/30/2020   PFIZER(Purple Top)SARS-COV-2 Vaccination 12/04/2019, 12/28/2019   Td 04/02/2005   Tdap 04/02/2005, 11/02/2016    TDAP status: Up to date  Flu Vaccine status: Due, Education has been provided regarding the importance of this vaccine. Advised may receive this vaccine at local pharmacy or Health Dept. Aware to provide a copy of the vaccination record if obtained from local pharmacy or Health Dept. Verbalized acceptance and understanding.    Covid-19 vaccine status: Completed vaccines  Qualifies for Shingles Vaccine? No   Zostavax completed No   Shingrix Completed?: No.    Education has been provided regarding the importance of this vaccine. Patient has been advised to call insurance company to determine out of pocket expense if they have not yet received this vaccine. Advised may also receive vaccine at local pharmacy or Health Dept. Verbalized acceptance and understanding.  Screening Tests Health Maintenance  Topic Date Due   PAP-Cervical Cytology Screening  11/03/2019   PAP SMEAR-Modifier  11/03/2019   INFLUENZA VACCINE  03/13/2022   COVID-19 Vaccine (4 - 2023-24 season) 04/13/2022   Medicare Annual Wellness (AWV)  07/12/2023  HPV VACCINES  Completed   Hepatitis C Screening  Completed   HIV Screening  Completed    Health Maintenance  Health Maintenance Due  Topic Date Due   PAP-Cervical Cytology Screening  11/03/2019   PAP SMEAR-Modifier  11/03/2019   INFLUENZA VACCINE  03/13/2022   COVID-19 Vaccine (4 - 2023-24 season) 04/13/2022          Additional Screening:  Hepatitis C Screening: Completed 10/10/21  Vision Screening: Recommended annual ophthalmology exams for early detection of glaucoma and other disorders of the eye. Is the patient up to date with their annual eye exam?  Yes  Who is the provider or what is  the name of the office in which the patient attends annual eye exams? Dr Joseph Art  If pt is not established with a provider, would they like to be referred to a provider to establish care? No .   Dental Screening: Recommended annual dental exams for proper oral hygiene  Community Resource Referral / Chronic Care Management: CRR required this visit?  No   CCM required this visit?  No      Plan:     I have personally reviewed and noted the following in the patient's chart:   Medical and social history Use of alcohol, tobacco or illicit drugs  Current medications and supplements including opioid prescriptions. Patient is not currently taking opioid prescriptions. Functional ability and status Nutritional status Physical activity Advanced directives List of other physicians Hospitalizations, surgeries, and ER visits in previous 12 months Vitals Screenings to include cognitive, depression, and falls Referrals and appointments  In addition, I have reviewed and discussed with patient certain preventive protocols, quality metrics, and best practice recommendations. A written personalized care plan for preventive services as well as general preventive health recommendations were provided to patient.     Marzella Schlein, LPN   [id number]   Nurse Notes: none   I have reviewed and agree with [facility] documentation.  Willow Ora, MD

## 2022-07-11 NOTE — Patient Instructions (Signed)
Ms. Bobak , Thank you for taking time to come for your Medicare Wellness Visit. I appreciate your ongoing commitment to your health goals. Please review the following plan we discussed and let me know if I can assist you in the future.   These are the goals we discussed:  Goals      Patient Stated     To get off medications         This is a list of the screening recommended for you and due dates:  Health Maintenance  Topic Date Due   Pap Smear  11/03/2019   Pap Smear  11/03/2019   Flu Shot  03/13/2022   COVID-19 Vaccine (4 - 2023-24 season) 04/13/2022   Medicare Annual Wellness Visit  07/12/2023   HPV Vaccine  Completed   Hepatitis C Screening: USPSTF Recommendation to screen - Ages 18-79 yo.  Completed   HIV Screening  Completed    Advanced directives: Advance directive discussed with you today. Even though you declined this today please call our office should you change your mind and we can give you the proper paperwork for you to fill out.  Conditions/risks identified: get off medications   Next appointment: Follow up in one year for your annual wellness visit.   Preventive Care 32-96 Years Old, Female Preventive care refers to lifestyle choices and visits with your health care provider that can promote health and wellness. Preventive care visits are also called wellness exams. What can I expect for my preventive care visit? Counseling During your preventive care visit, your health care provider may ask about your: Medical history, including: Past medical problems. Family medical history. Pregnancy history. Current health, including: Menstrual cycle. Method of birth control. Emotional well-being. Home life and relationship well-being. Sexual activity and sexual health. Lifestyle, including: Alcohol, nicotine or tobacco, and drug use. Access to firearms. Diet, exercise, and sleep habits. Work and work Statistician. Sunscreen use. Safety issues such as seatbelt  and bike helmet use. Physical exam Your health care provider may check your: Height and weight. These may be used to calculate your BMI (body mass index). BMI is a measurement that tells if you are at a healthy weight. Waist circumference. This measures the distance around your waistline. This measurement also tells if you are at a healthy weight and may help predict your risk of certain diseases, such as type 2 diabetes and high blood pressure. Heart rate and blood pressure. Body temperature. Skin for abnormal spots. What immunizations do I need? Vaccines are usually given at various ages, according to a schedule. Your health care provider will recommend vaccines for you based on your age, medical history, and lifestyle or other factors, such as travel or where you work. What tests do I need? Screening Your health care provider may recommend screening tests for certain conditions. This may include: Pelvic exam and Pap test. Lipid and cholesterol levels. Diabetes screening. This is done by checking your blood sugar (glucose) after you have not eaten for a while (fasting). Hepatitis B test. Hepatitis C test. HIV (human immunodeficiency virus) test. STI (sexually transmitted infection) testing, if you are at risk. BRCA-related cancer screening. This may be done if you have a family history of breast, ovarian, tubal, or peritoneal cancers. Talk with your health care provider about your test results, treatment options, and if necessary, the need for more tests. Follow these instructions at home: Eating and drinking  Eat a healthy diet that includes fresh fruits and vegetables, whole grains, lean  protein, and low-fat dairy products. Take vitamin and mineral supplements as recommended by your health care provider. Do not drink alcohol if: Your health care provider tells you not to drink. You are pregnant, may be pregnant, or are planning to become pregnant. If you drink alcohol: Limit how  much you have to 0-1 drink a day. Know how much alcohol is in your drink. In the [place] one drink equals one 12 oz bottle of beer (355 mL), one 5 oz glass of wine (148 mL), or one 1 oz glass of hard liquor (44 mL). Lifestyle Brush your teeth every morning and night with fluoride toothpaste. Floss one time each day. Exercise for at least 30 minutes 5 or more days each week. Do not use any products that contain nicotine or tobacco. These products include cigarettes, chewing tobacco, and vaping devices, such as e-cigarettes. If you need help quitting, ask your health care provider. Do not use drugs. If you are sexually active, practice safe sex. Use a condom or other form of protection to prevent STIs. If you do not wish to become pregnant, use a form of birth control. If you plan to become pregnant, see your health care provider for a prepregnancy visit. Find healthy ways to manage stress, such as: Meditation, yoga, or listening to music. Journaling. Talking to a trusted person. Spending time with friends and family. Minimize exposure to UV radiation to reduce your risk of skin cancer. Safety Always wear your seat belt while driving or riding in a vehicle. Do not drive: If you have been drinking alcohol. Do not ride with someone who has been drinking. If you have been using any mind-altering substances or drugs. While texting. When you are tired or distracted. Wear a helmet and other protective equipment during sports activities. If you have firearms in your house, make sure you follow all gun safety procedures. Seek help if you have been physically or sexually abused. What's next? Go to your health care provider once a year for an annual wellness visit. Ask your health care provider how often you should have your eyes and teeth checked. Stay up to date on all vaccines. This information is not intended to replace advice given to you by your health care provider. Make sure you discuss any  questions you have with your health care provider. Document Revised: 01/25/2021 Document Reviewed: 01/25/2021 Elsevier Patient Education  [place].

## 2022-07-11 NOTE — Progress Notes (Signed)
Subjective:    Patient ID: [name], female    DOB: [birth year], 26 y.o.   MRN: [id number]  HPI Pt in for recent seizure type activity.   Pt states on Monday had seizure. She was looking at phone. She states saw blood on her tongue and was confused. Then saw cut on tongue. Now more than 48 hours. When pt talked to mom on phone she was confused. Mom is nurse and asked some orientation questions and she thought was christmas.   Pt had one seizure in past and now again. She stopped ativan in past had seizures. This time patient accidentally ran out of ativan.   Late last week she tried to get ativan refil from psychiatrist office.  Delay over the holidays.  She did finally get refill of ativan and is know on 1 mg tid. Was on qid dosing in past.  Tongue was swollen up until last night. Pt used ibuprofen 600 mg tid. Tongue is now less swollen.    Review of Systems  Constitutional:  Negative for chills, fatigue and fever.  Respiratory:  Negative for cough, chest tightness, shortness of breath and wheezing.   Cardiovascular:  Negative for chest pain.  Gastrointestinal:  Negative for abdominal pain and blood in stool.  Genitourinary:  Negative for flank pain.  Musculoskeletal:  Negative for back pain.  Hematological:  Negative for adenopathy. Does not bruise/bleed easily.  Psychiatric/Behavioral:  Negative for behavioral problems and confusion.    Past Medical History:  Diagnosis Date   Acute vaginitis 05/20/2019   ADD (attention deficit disorder) 04/24/2011   ADHD (attention deficit hyperactivity disorder)    Allergy    Anxiety    Benzodiazepine dependence (HCC) 05/12/2020   Bipolar disorder (HCC)    Bipolar disorder, current episode manic severe with psychotic features (HCC)    Bipolar I disorder (HCC) 05/12/2020   Constipation 04/03/2018   Delirium tremens (HCC) 05/20/2019   Depression    Eczema 12/11/13   Genital herpes simplex 10/24/2017   High risk heterosexual behavior  05/20/2019   MDD (major depressive disorder), recurrent severe, without psychosis (HCC) 02/11/2018   Morbid obesity (HCC) 09/06/2019   Pelvic pain 01/31/2018   Possible pregnancy 09/06/2019   Preventative health care 03/16/2013   Schizo-affective schizophrenia (HCC)    Schizoaffective disorder (HCC) 03/08/2018   Seizure (HCC) 05/20/2019   Severe bipolar I disorder, current or most recent episode depressed (HCC) 03/30/2017   STD exposure 05/13/2015   Syncope 03/03/2017   Vitamin D deficiency 03/10/2019     Social History   Socioeconomic History   Marital status: Single    Spouse name: Not on file   Number of children: 0   Years of education: Not on file   Highest education level: Associate degree: academic program  Occupational History   Occupation: Unemployed  Tobacco Use   Smoking status: Never   Smokeless tobacco: Never  Vaping Use   Vaping Use: Former   Quit date: 09/01/2017  Substance and Sexual Activity   Alcohol use: Not Currently    Comment: occasionally   Drug use: Not Currently   Sexual activity: Not Currently    Birth control/protection: Pill  Other Topics Concern   Not on file  Social History Narrative   Lives with mom in an apartment on the second floor.  No children.  Currently not working.  Education: college. Left handed   Social Determinants of Health   Financial Resource Strain: Low Risk  (07/11/2022)  Overall Financial Resource Strain (CARDIA)    Difficulty of Paying Living Expenses: Not hard at all  Food Insecurity: No Food Insecurity (07/11/2022)   Hunger Vital Sign    Worried About Running Out of Food in the Last Year: Never true    Ran Out of Food in the Last Year: Never true  Transportation Needs: No Transportation Needs (07/11/2022)   PRAPARE - Administrator, Civil Service (Medical): No    Lack of Transportation (Non-Medical): No  Physical Activity: Inactive (07/11/2022)   Exercise Vital Sign    Days of Exercise per Week: 0 days     Minutes of Exercise per Session: 0 min  Stress: No Stress Concern Present (07/11/2022)   [employer] of Occupational Health - Occupational Stress Questionnaire    Feeling of Stress : Only a little  Social Connections: Moderately Isolated (07/11/2022)   Social Connection and Isolation Panel [NHANES]    Frequency of Communication with Friends and Family: More than three times a week    Frequency of Social Gatherings with Friends and Family: More than three times a week    Attends Religious Services: 1 to 4 times per year    Active Member of [employer] or Organizations: No    Attends Banker Meetings: Never    Marital Status: Never married  Intimate Partner Violence: Not At Risk (07/11/2022)   Humiliation, Afraid, Rape, and Kick questionnaire    Fear of Current or Ex-Partner: No    Emotionally Abused: No    Physically Abused: No    Sexually Abused: No    No past surgical history on file.  Family History  Problem Relation Age of Onset   Asthma Mother    Depression Father    Stroke Father    Heart disease Father    Asthma Father    Hypertension Father        pulmonary hypertension   Heart disease Maternal Uncle    Leukemia Maternal Uncle    Diabetes Maternal Grandfather    Hypertension Maternal Grandmother    COPD Maternal Grandmother    Diabetes Paternal Grandfather    Stroke Paternal Grandmother    Diabetes Paternal Grandmother     Allergies  Allergen Reactions   Concerta [Methylphenidate] Other (See Comments)    hallucinations   Terbinafine Other (See Comments)   Trazodone And Nefazodone Other (See Comments)    Pt stated she has nightmares   Clindamycin Hcl Rash   Haldol [Haloperidol] Anxiety    Unable to seat still   Terbinafine Hcl Nausea And Vomiting    Current Outpatient Medications on File Prior to Visit  Medication Sig Dispense Refill   azelastine (ASTELIN) 0.1 % nasal spray Place 2 sprays into both nostrils daily as needed for rhinitis or  allergies as directed 30 mL 12   buPROPion (WELLBUTRIN XL) 150 MG 24 hr tablet Take 1 tablet by mouth every morning 90 tablet 4   famotidine (PEPCID) 20 MG tablet Take 1 tablet (20 mg total) by mouth 2 (two) times daily. (Patient taking differently: Take 20 mg by mouth as needed.) 60 tablet 2   fluticasone (FLONASE) 50 MCG/ACT nasal spray PLACE 2 SPRAYS INTO EACH NOSTRIL EVERY DAY 16 g 1   levocetirizine (XYZAL) 5 MG tablet Take 1 tablet (5 mg total) by mouth every evening. 90 tablet 0   LORazepam (ATIVAN) 1 MG tablet TAKE 1 TABLET BY MOUTH 4 TIMES DAILY. 360 tablet 0   LORazepam (ATIVAN) 1 MG  tablet Take 1 tablet (1 mg total) by mouth 3 (three) times daily. (Note: change in directions) 270 tablet 0   norgestimate-ethinyl estradiol (VYLIBRA) 0.25-35 MG-MCG tablet Take 1 tablet by mouth daily. 84 tablet 0   Paliperidone Palmitate ER (INVEGA HAFYERA) 1560 MG/5ML SUSY Inject 1 syringe intramuscularly every 6 months 5 mL 1   propranolol (INDERAL) 10 MG tablet Take 1/2 tablet (5 mg total) by mouth daily. 45 tablet 3   QUEtiapine (SEROQUEL) 100 MG tablet Take 1 tablet (100 mg total) by mouth at bedtime. 90 tablet 4   triamcinolone (KENALOG) 0.1 %      valACYclovir (VALTREX) 1000 MG tablet Take 1 tablet (1,000 mg total) by mouth daily. 30 tablet 5   [DISCONTINUED] metoprolol tartrate (LOPRESSOR) 25 MG tablet 1/2 tab po bid (Patient not taking: Reported on 06/08/2020) 30 tablet 0   [DISCONTINUED] OLANZapine zydis (ZYPREXA) 20 MG disintegrating tablet Take 1 tablet (20 mg total) by mouth at bedtime. (Patient not taking: Reported on 06/08/2020) 30 tablet 0   [DISCONTINUED] paliperidone (INVEGA) 9 MG 24 hr tablet Take 1 tablet (9 mg total) by mouth at bedtime. (Patient not taking: Reported on 06/08/2020) 30 tablet 0   No current facility-administered medications on file prior to visit.    BP 117/70   Pulse 80   Temp 98.2 F (36.8 C)   Resp 18   Ht 5\' 4"  (1.626 m)   Wt 228 lb (103.4 kg)   SpO2 96%    BMI 39.14 kg/m        Objective:   Physical Exam  General Mental Status- Alert. General Appearance- Not in acute distress.   Skin General: Color- Normal Color. Moisture- Normal Moisture.  Neck Carotid Arteries- Normal color. Moisture- Normal Moisture. No carotid bruits. No JVD.  Chest and Lung Exam Auscultation: Breath Sounds:-Normal.  Cardiovascular Auscultation:Rythm- Regular. Murmurs & Other Heart Sounds:Auscultation of the heart reveals- No Murmurs.  Abdomen Inspection:-Inspeection Normal. Palpation/Percussion:Note:No mass. Palpation and Percussion of the abdomen reveal- Non Tender, Non Distended + BS, no rebound or guarding.    Neurologic Cranial Nerve exam:- CN III-XII intact(No nystagmus), symmetric smile. Heal to Toe Gait exam:-Normal. Finger to Nose:- Normal/Intact Strength:- 5/5 equal and symmetric strength both upper and lower extremities.    Heent- on inspection pt tongue bite healing well by secondary intention. Bite wound appear filled in. No bleeding no dc. Pt did show me prior picture which looked worse.     Assessment & Plan:   Patient Instructions  For psychiatric condition continue to follow up with psychiatrist. Stable presently.  Glad you got back on ativan. Continue as psychiatrist prescribed.  I went ahead and placed referral back to Dr. as they have seen you in past for seizures. If any other seizures were to occur then be seen in ED.   For tongue bite healing well rx magic mouth wash to use 4 times daily swish and spit.  Follow up as regular scheduled with pcp or sooner if needed.   Karel Jarvis, PA-C

## 2022-07-11 NOTE — Patient Instructions (Addendum)
For psychiatric condition continue to follow up with psychiatrist. Stable presently.  Glad you got back on ativan. Continue as psychiatrist prescribed.  I went ahead and placed referral back to Dr. Karel Jarvis as they have seen you in past for seizures. If any other seizures were to occur then be seen in ED.   For tongue bite healing well rx magic mouth wash to use 4 times daily swish and spit.  Follow up as regular scheduled with pcp or sooner if needed.  Also rx low number of ibuprofen.

## 2022-07-12 ENCOUNTER — Other Ambulatory Visit: Payer: Self-pay | Admitting: Family Medicine

## 2022-07-12 ENCOUNTER — Other Ambulatory Visit (HOSPITAL_COMMUNITY): Payer: Self-pay

## 2022-07-12 ENCOUNTER — Encounter: Payer: Self-pay | Admitting: Family Medicine

## 2022-07-12 DIAGNOSIS — N898 Other specified noninflammatory disorders of vagina: Secondary | ICD-10-CM

## 2022-07-12 MED ORDER — FLUCONAZOLE 150 MG PO TABS
ORAL_TABLET | ORAL | 0 refills | Status: DC
  Filled 2022-07-12: qty 2, 3d supply, fill #0

## 2022-07-13 ENCOUNTER — Other Ambulatory Visit (HOSPITAL_COMMUNITY): Payer: Self-pay

## 2022-07-30 ENCOUNTER — Encounter: Payer: Self-pay | Admitting: Family Medicine

## 2022-08-01 ENCOUNTER — Other Ambulatory Visit (HOSPITAL_COMMUNITY): Payer: Self-pay

## 2022-08-02 ENCOUNTER — Encounter: Payer: Self-pay | Admitting: Family Medicine

## 2022-08-02 ENCOUNTER — Other Ambulatory Visit (HOSPITAL_COMMUNITY): Payer: Self-pay

## 2022-08-02 ENCOUNTER — Ambulatory Visit (INDEPENDENT_AMBULATORY_CARE_PROVIDER_SITE_OTHER): Payer: Medicare Other | Admitting: Family Medicine

## 2022-08-02 ENCOUNTER — Other Ambulatory Visit (HOSPITAL_COMMUNITY)
Admission: RE | Admit: 2022-08-02 | Discharge: 2022-08-02 | Disposition: A | Discharge status: Home / Self Care | Payer: Medicare Other | Source: Ambulatory Visit | Attending: Family Medicine | Admitting: Family Medicine

## 2022-08-02 VITALS — BP 110/80 | HR 99 | Temp 98.2°F | Resp 18 | Ht 64.0 in | Wt 231.8 lb

## 2022-08-02 DIAGNOSIS — N898 Other specified noninflammatory disorders of vagina: Secondary | ICD-10-CM

## 2022-08-02 DIAGNOSIS — R300 Dysuria: Secondary | ICD-10-CM | POA: Diagnosis not present

## 2022-08-02 LAB — POC URINALSYSI DIPSTICK (AUTOMATED)
Bilirubin, UA: NEGATIVE
Blood, UA: NEGATIVE
Glucose, UA: NEGATIVE
Ketones, UA: NEGATIVE
Leukocytes, UA: NEGATIVE
Nitrite, UA: NEGATIVE
Protein, UA: NEGATIVE
Spec Grav, UA: >=1.03 — AB (ref 1.010–1.025)
Urobilinogen, UA: 0.2 E.U./dL
pH, UA: 6 (ref 5.0–8.0)

## 2022-08-02 MED ORDER — METRONIDAZOLE 0.75 % VA GEL
1.0000 | Freq: Every day | VAGINAL | 0 refills | Status: AC
  Filled 2022-08-02: qty 70, 7d supply, fill #0

## 2022-08-02 NOTE — Progress Notes (Signed)
Subjective:   By signing my name below, I, [name], attest that this documentation has been prepared under the direction and in the presence of [name], Monai Hindes, 08/02/2022.   Patient ID: Jaclyn SladeShekinah Y [name], female    DOB: [birth year], 26 y.o.   MRN: [id number]  Chief Complaint  Patient presents with  . Vaginal Discharge    Sxs started Monday.     Vaginal Discharge The patient's primary symptoms include vaginal discharge. Associated symptoms include dysuria and frequency. Pertinent negatives include no back pain, fever, headaches, rash or vomiting.   Patient is in today for an office visit.  Vaginal Discharge Patient suspects that she has a yeast infection. She reports that she has clear discharge that has a fish odor. Patient states that she has not washed in 3 days because she has been depressed. She confirms burning with urination. She denies urinary frequency.   She sees psych for bipolar depression.  She is not suicidal.  Health Maintenance Due  Topic Date Due  . PAP-Cervical Cytology Screening  11/03/2019  . PAP SMEAR-Modifier  11/03/2019  . INFLUENZA VACCINE  03/13/2022  . COVID-19 Vaccine (4 - 2023-24 season) 04/13/2022    Past Medical History:  Diagnosis Date  . Acute vaginitis 05/20/2019  . ADD (attention deficit disorder) 04/24/2011  . ADHD (attention deficit hyperactivity disorder)   . Allergy   . Anxiety   . Benzodiazepine dependence (HCC) 05/12/2020  . Bipolar disorder (HCC)   . Bipolar disorder, current episode manic severe with psychotic features (HCC)   . Bipolar I disorder (HCC) 05/12/2020  . Constipation 04/03/2018  . Delirium tremens (HCC) 05/20/2019  . Depression   . Eczema 12/11/13  . Genital herpes simplex 10/24/2017  . High risk heterosexual behavior 05/20/2019  . MDD (major depressive disorder), recurrent severe, without psychosis (HCC) 02/11/2018  . Morbid obesity (HCC) 09/06/2019  . Pelvic pain 01/31/2018  . Possible pregnancy 09/06/2019  .  Preventative health care 03/16/2013  . Schizo-affective schizophrenia (HCC)   . Schizoaffective disorder (HCC) 03/08/2018  . Seizure (HCC) 05/20/2019  . Severe bipolar I disorder, current or most recent episode depressed (HCC) 03/30/2017  . STD exposure 05/13/2015  . Syncope 03/03/2017  . Vitamin D deficiency 03/10/2019    No past surgical history on file.  Family History  Problem Relation Age of Onset  . Asthma Mother   . Depression Father   . Stroke Father   . Heart disease Father   . Asthma Father   . Hypertension Father        pulmonary hypertension  . Heart disease Maternal Uncle   . Leukemia Maternal Uncle   . Diabetes Maternal Grandfather   . Hypertension Maternal Grandmother   . COPD Maternal Grandmother   . Diabetes Paternal Grandfather   . Stroke Paternal Grandmother   . Diabetes Paternal Grandmother     Social History   Socioeconomic History  . Marital status: Single    Spouse name: Not on file  . Number of children: 0  . Years of education: Not on file  . Highest education level: Associate degree: academic program  Occupational History  . Occupation: Unemployed  Tobacco Use  . Smoking status: Never  . Smokeless tobacco: Never  Vaping Use  . Vaping Use: Former  . Quit date: 09/01/2017  Substance and Sexual Activity  . Alcohol use: Not Currently    Comment: occasionally  . Drug use: Not Currently  . Sexual activity: Not Currently    Birth  control/protection: Pill  Other Topics Concern  . Not on file  Social History Narrative   Lives with mom in an apartment on the second floor.  No children.  Currently not working.  Education: college. Left handed   Social Determinants of Health   Financial Resource Strain: Low Risk  (07/11/2022)   Overall Financial Resource Strain (CARDIA)   . Difficulty of Paying Living Expenses: Not hard at all  Food Insecurity: No Food Insecurity (07/11/2022)   Hunger Vital Sign   . Worried About Programme researcher, broadcasting/film/video in the Last  Year: Never true   . Ran Out of Food in the Last Year: Never true  Transportation Needs: No Transportation Needs (07/11/2022)   PRAPARE - Transportation   . Lack of Transportation (Medical): No   . Lack of Transportation (Non-Medical): No  Physical Activity: Inactive (07/11/2022)   Exercise Vital Sign   . Days of Exercise per Week: 0 days   . Minutes of Exercise per Session: 0 min  Stress: No Stress Concern Present (07/11/2022)   [employer] of Occupational Health - Occupational Stress Questionnaire   . Feeling of Stress : Only a little  Social Connections: Moderately Isolated (07/11/2022)   Social Connection and Isolation Panel [NHANES]   . Frequency of Communication with Friends and Family: More than three times a week   . Frequency of Social Gatherings with Friends and Family: More than three times a week   . Attends Religious Services: 1 to 4 times per year   . Active Member of Clubs or Organizations: No   . Attends Banker Meetings: Never   . Marital Status: Never married  Intimate Partner Violence: Not At Risk (07/11/2022)   Humiliation, Afraid, Rape, and Kick questionnaire   . Fear of Current or Ex-Partner: No   . Emotionally Abused: No   . Physically Abused: No   . Sexually Abused: No    Outpatient Medications Prior to Visit  Medication Sig Dispense Refill  . azelastine (ASTELIN) 0.1 % nasal spray Place 2 sprays into both nostrils daily as needed for rhinitis or allergies as directed 30 mL 12  . buPROPion (WELLBUTRIN XL) 150 MG 24 hr tablet Take 1 tablet by mouth every morning 90 tablet 4  . famotidine (PEPCID) 20 MG tablet Take 1 tablet (20 mg total) by mouth 2 (two) times daily. (Patient taking differently: Take 20 mg by mouth as needed.) 60 tablet 2  . fluconazole (DIFLUCAN) 150 MG tablet Take one tablet (150 mg) by mouth now for 1 dose, may repeat in 3 days if still needed. 2 tablet 0  . fluticasone (FLONASE) 50 MCG/ACT nasal spray PLACE 2 SPRAYS INTO  EACH NOSTRIL EVERY DAY 16 g 1  . ibuprofen (ADVIL) 600 MG tablet Take 1 tablet (600 mg total) by mouth every 8 (eight) hours as needed. 15 tablet 0  . levocetirizine (XYZAL) 5 MG tablet Take 1 tablet (5 mg total) by mouth every evening. 90 tablet 0  . LORazepam (ATIVAN) 1 MG tablet TAKE 1 TABLET BY MOUTH 4 TIMES DAILY. 360 tablet 0  . LORazepam (ATIVAN) 1 MG tablet Take 1 tablet (1 mg total) by mouth 3 (three) times daily. (Note: change in directions) 270 tablet 0  . norgestimate-ethinyl estradiol (VYLIBRA) 0.25-35 MG-MCG tablet Take 1 tablet by mouth daily. 84 tablet 0  . nystatin-diphenhydrAMINE-alum & mag hydroxide-simeth Swish with 5 ml 4 times a day then spit 201 mL 0  . Paliperidone Palmitate ER (INVEGA HAFYERA) 1560  MG/5ML SUSY Inject 1 syringe intramuscularly every 6 months 5 mL 1  . propranolol (INDERAL) 10 MG tablet Take 1/2 tablet (5 mg total) by mouth daily. 45 tablet 3  . QUEtiapine (SEROQUEL) 100 MG tablet Take 1 tablet (100 mg total) by mouth at bedtime. 90 tablet 4  . triamcinolone (KENALOG) 0.1 %     . valACYclovir (VALTREX) 1000 MG tablet Take 1 tablet (1,000 mg total) by mouth daily. 30 tablet 5   No facility-administered medications prior to visit.    Allergies  Allergen Reactions  . Concerta [Methylphenidate] Other (See Comments)    hallucinations  . Terbinafine Other (See Comments)  . Trazodone And Nefazodone Other (See Comments)    Pt stated she has nightmares  . Clindamycin Hcl Rash  . Haldol [Haloperidol] Anxiety    Unable to seat still  . Terbinafine Hcl Nausea And Vomiting    Review of Systems  Constitutional:  Negative for fever and malaise/fatigue.  HENT:  Negative for congestion.   Eyes:  Negative for blurred vision.  Respiratory:  Negative for cough and shortness of breath.   Cardiovascular:  Negative for chest pain, palpitations and leg swelling.  Gastrointestinal:  Negative for vomiting.  Genitourinary:  Positive for dysuria, frequency and vaginal  discharge.       (+) vaginal discharge  Musculoskeletal:  Negative for back pain.  Skin:  Negative for rash.  Neurological:  Negative for loss of consciousness and headaches.       Objective:    Physical Exam Vitals and nursing note reviewed.  Constitutional:      General: She is not in acute distress.    Appearance: Normal appearance. She is not ill-appearing.  HENT:     Head: Normocephalic and atraumatic.     Right Ear: External ear normal.     Left Ear: External ear normal.  Eyes:     Extraocular Movements: Extraocular movements intact.     Pupils: Pupils are equal, round, and reactive to light.  Cardiovascular:     Rate and Rhythm: Normal rate and regular rhythm.     Heart sounds: Normal heart sounds. No murmur heard.    No gallop.  Pulmonary:     Effort: Pulmonary effort is normal. No respiratory distress.     Breath sounds: Normal breath sounds. No wheezing or rales.  Genitourinary:    Vagina: Vaginal discharge present.     Comments: Ua normal  Self swab done  Skin:    General: Skin is warm and dry.  Neurological:     Mental Status: She is alert and oriented to person, place, and time.  Psychiatric:        Judgment: Judgment normal.    There were no vitals taken for this visit. Wt Readings from Last 3 Encounters:  07/11/22 228 lb (103.4 kg)  07/11/22 223 lb (101.2 kg)  06/06/22 223 lb 9.6 oz (101.4 kg)       Assessment & Plan:   Problem List Items Addressed This Visit   None  No orders of the defined types were placed in this encounter.   I, Jaclyn Spates, personally preformed the services described in this documentation.  All medical record entries made by the scribe were at my direction and in my presence.  I have reviewed the chart and discharge instructions (if applicable) and agree that the record reflects my personal performance and is accurate and complete. 08/02/2022.   I,Verona [name],acting as a Neurosurgeon for [employer],  DO.,have  documented all relevant documentation on the behalf of [name], DO,as directed by  [name], DO while in the presence of [name], DO.    Jaclyn Buttner

## 2022-08-03 ENCOUNTER — Other Ambulatory Visit (HOSPITAL_COMMUNITY): Payer: Self-pay

## 2022-08-03 LAB — CERVICOVAGINAL ANCILLARY ONLY
Bacterial Vaginitis (gardnerella): NEGATIVE
Candida Glabrata: NEGATIVE
Candida Vaginitis: NEGATIVE
Chlamydia: NEGATIVE
Comment: NEGATIVE
Comment: NEGATIVE
Comment: NEGATIVE
Comment: NEGATIVE
Comment: NEGATIVE
Comment: NORMAL
Neisseria Gonorrhea: NEGATIVE
Trichomonas: NEGATIVE

## 2022-08-07 ENCOUNTER — Other Ambulatory Visit: Payer: Self-pay | Admitting: Family Medicine

## 2022-08-07 ENCOUNTER — Other Ambulatory Visit (HOSPITAL_COMMUNITY): Payer: Self-pay

## 2022-08-07 DIAGNOSIS — J302 Other seasonal allergic rhinitis: Secondary | ICD-10-CM

## 2022-08-07 MED ORDER — FLUTICASONE PROPIONATE 50 MCG/ACT NA SUSP
2.0000 | Freq: Every day | NASAL | 1 refills | Status: DC
  Filled 2022-08-07: qty 16, 30d supply, fill #0
  Filled 2022-09-29: qty 16, 30d supply, fill #1

## 2022-08-08 ENCOUNTER — Other Ambulatory Visit (HOSPITAL_BASED_OUTPATIENT_CLINIC_OR_DEPARTMENT_OTHER): Payer: Self-pay

## 2022-08-08 MED ORDER — INVEGA HAFYERA 1560 MG/5ML IM SUSY
5.0000 mL | PREFILLED_SYRINGE | INTRAMUSCULAR | 0 refills | Status: DC
  Filled 2022-08-08: qty 5, 120d supply, fill #0

## 2022-08-09 ENCOUNTER — Other Ambulatory Visit (HOSPITAL_BASED_OUTPATIENT_CLINIC_OR_DEPARTMENT_OTHER): Payer: Self-pay

## 2022-08-10 ENCOUNTER — Other Ambulatory Visit (HOSPITAL_BASED_OUTPATIENT_CLINIC_OR_DEPARTMENT_OTHER): Payer: Self-pay

## 2022-08-15 ENCOUNTER — Telehealth: Payer: Self-pay

## 2022-08-15 NOTE — Telephone Encounter (Signed)
Appt scheduled

## 2022-08-15 NOTE — Telephone Encounter (Signed)
Caller Name [place] Phone Number [phone] Patient Name [name] Patient DOB October 18, 1995 Call Type Message Only Information Provided Reason for Call Request to Schedule Office Appointment Initial Comment Caller states she is calling to make an appt. Patient request to speak to RN No Additional Comment Caller was provided office hours. Disp. Time Disposition Final User 08/14/2022 5:12:08 PM General Information Provided Yes Dotts, [place] Call Closed By: Everlene Farrier Transaction Date/Time: 08/14/2022 5:09:02 PM (ET)

## 2022-08-17 ENCOUNTER — Other Ambulatory Visit (HOSPITAL_COMMUNITY)
Admission: RE | Admit: 2022-08-17 | Discharge: 2022-08-17 | Disposition: A | Discharge status: Home / Self Care | Payer: Medicare Other | Source: Ambulatory Visit | Attending: Family Medicine | Admitting: Family Medicine

## 2022-08-17 ENCOUNTER — Other Ambulatory Visit (HOSPITAL_BASED_OUTPATIENT_CLINIC_OR_DEPARTMENT_OTHER): Payer: Self-pay

## 2022-08-17 ENCOUNTER — Ambulatory Visit (INDEPENDENT_AMBULATORY_CARE_PROVIDER_SITE_OTHER): Payer: Medicare Other | Admitting: Family Medicine

## 2022-08-17 ENCOUNTER — Encounter: Payer: Self-pay | Admitting: Family Medicine

## 2022-08-17 ENCOUNTER — Ambulatory Visit (INDEPENDENT_AMBULATORY_CARE_PROVIDER_SITE_OTHER): Payer: Medicare Other | Admitting: Neurology

## 2022-08-17 ENCOUNTER — Encounter: Payer: Self-pay | Admitting: Neurology

## 2022-08-17 VITALS — BP 130/84 | HR 95 | Temp 98.4°F | Resp 18 | Ht 64.0 in | Wt 231.0 lb

## 2022-08-17 VITALS — BP 114/66 | HR 102 | Resp 18 | Ht 64.0 in | Wt 233.0 lb

## 2022-08-17 DIAGNOSIS — N898 Other specified noninflammatory disorders of vagina: Secondary | ICD-10-CM

## 2022-08-17 DIAGNOSIS — R569 Unspecified convulsions: Secondary | ICD-10-CM | POA: Diagnosis not present

## 2022-08-17 MED ORDER — METRONIDAZOLE 0.75 % VA GEL
1.0000 | Freq: Every day | VAGINAL | 0 refills | Status: AC
  Filled 2022-08-17: qty 70, 4d supply, fill #0

## 2022-08-17 NOTE — Progress Notes (Addendum)
Subjective:   By signing my name below, I, [name], attest that this documentation has been prepared under the direction and in the presence of Ann Held, DO. 08/17/2022   Patient ID: [name], female    DOB: 1996-04-02, 27 y.o.   MRN: [id number]  Chief Complaint  Patient presents with   Vaginal Discharge    X3 days, pt states having discharge. Pt states during a depression episode she didn't shower for for 3 days    Vaginal Discharge The patient's primary symptoms include vaginal discharge. Pertinent negatives include no abdominal pain, back pain, chills, constipation, diarrhea, dysuria, fever, frequency, headaches, hematuria, nausea, rash, urgency or vomiting.   Patient is in today for a office visit.   She complains of clear vaginal discharge. She also has yellow discolored urine. She has not taken a bath for the past 2 days due to her mood being down. She notes she develops similar symptoms when she does not shower for 2 days. She has not had sexual intercourse for the past 2 months but is requesting a pregnancy test due to missing a birth control pill dosage while sexual active.    Past Medical History:  Diagnosis Date   Acute vaginitis 05/20/2019   ADD (attention deficit disorder) 04/24/2011   ADHD (attention deficit hyperactivity disorder)    Allergy    Anxiety    Benzodiazepine dependence ([place]) 05/12/2020   Bipolar disorder ([place])    Bipolar disorder, current episode manic severe with psychotic features ([place])    Bipolar I disorder ([place]) 05/12/2020   Constipation 04/03/2018   Delirium tremens ([place]) 05/20/2019   Depression    Eczema 12/11/13   Genital herpes simplex 10/24/2017   High risk heterosexual behavior 05/20/2019   MDD (major depressive disorder), recurrent severe, without psychosis ([place]) 02/11/2018   Morbid obesity ([place]) 09/06/2019   Pelvic pain 01/31/2018   Possible pregnancy 09/06/2019   Preventative health care 03/16/2013   Schizo-affective  schizophrenia ([place])    Schizoaffective disorder ([place]) 03/08/2018   Seizure ([place]) 05/20/2019   Severe bipolar I disorder, current or most recent episode depressed ([place]) 03/30/2017   STD exposure 05/13/2015   Syncope 03/03/2017   Vitamin D deficiency 03/10/2019    No past surgical history on file.  Family History  Problem Relation Age of Onset   Asthma Mother    Depression Father    Stroke Father    Heart disease Father    Asthma Father    Hypertension Father        pulmonary hypertension   Heart disease Maternal Uncle    Leukemia Maternal Uncle    Diabetes Maternal Grandfather    Hypertension Maternal Grandmother    COPD Maternal Grandmother    Diabetes Paternal Grandfather    Stroke Paternal Grandmother    Diabetes Paternal Grandmother     Social History   Socioeconomic History   Marital status: Single    Spouse name: Not on file   Number of children: 0   Years of education: Not on file   Highest education level: Associate degree: academic program  Occupational History   Occupation: Unemployed  Tobacco Use   Smoking status: Never   Smokeless tobacco: Never  Vaping Use   Vaping Use: Former   Quit date: 09/01/2017  Substance and Sexual Activity   Alcohol use: Not Currently    Comment: occasionally   Drug use: Not Currently   Sexual activity: Not Currently    Birth control/protection: Pill  Other Topics Concern   Not on file  Social History Narrative   Lives with mom in an apartment on the second floor.  No children.  Currently not working.  Education: college. Left handed   Drinks no caffiene   Lives with mom, sista and niece   Social Determinants of Health   Financial Resource Strain: Low Risk  (07/11/2022)   Overall Financial Resource Strain (CARDIA)    Difficulty of Paying Living Expenses: Not hard at all  Food Insecurity: No Food Insecurity (07/11/2022)   Hunger Vital Sign    Worried About Running Out of Food in the Last Year: Never true    Ran Out of  Food in the Last Year: Never true  Transportation Needs: No Transportation Needs (07/11/2022)   PRAPARE - Hydrologist (Medical): No    Lack of Transportation (Non-Medical): No  Physical Activity: Inactive (07/11/2022)   Exercise Vital Sign    Days of Exercise per Week: 0 days    Minutes of Exercise per Session: 0 min  Stress: No Stress Concern Present (07/11/2022)   [place]    Feeling of Stress : Only a little  Social Connections: Moderately Isolated (07/11/2022)   Social Connection and Isolation Panel [NHANES]    Frequency of Communication with Friends and Family: More than three times a week    Frequency of Social Gatherings with Friends and Family: More than three times a week    Attends Religious Services: 1 to 4 times per year    Active Member of [employer] or Organizations: No    Attends Archivist Meetings: Never    Marital Status: Never married  Intimate Partner Violence: Not At Risk (07/11/2022)   Humiliation, Afraid, Rape, and Kick questionnaire    Fear of Current or Ex-Partner: No    Emotionally Abused: No    Physically Abused: No    Sexually Abused: No    Outpatient Medications Prior to Visit  Medication Sig Dispense Refill   azelastine (ASTELIN) 0.1 % nasal spray Place 2 sprays into both nostrils daily as needed for rhinitis or allergies as directed 30 mL 12   buPROPion (WELLBUTRIN XL) 150 MG 24 hr tablet Take 1 tablet by mouth every morning 90 tablet 4   famotidine (PEPCID) 20 MG tablet Take 1 tablet (20 mg total) by mouth 2 (two) times daily. (Patient not taking: Reported on 08/17/2022) 60 tablet 2   fluconazole (DIFLUCAN) 150 MG tablet Take one tablet (150 mg) by mouth now for 1 dose, may repeat in 3 days if still needed. (Patient not taking: Reported on 08/17/2022) 2 tablet 0   fluticasone (FLONASE) 50 MCG/ACT nasal spray Place 2 sprays into both nostrils daily. 16 g 1    ibuprofen (ADVIL) 600 MG tablet Take 1 tablet (600 mg total) by mouth every 8 (eight) hours as needed. (Patient not taking: Reported on 08/17/2022) 15 tablet 0   levocetirizine (XYZAL) 5 MG tablet Take 1 tablet (5 mg total) by mouth every evening. 90 tablet 0   LORazepam (ATIVAN) 1 MG tablet TAKE 1 TABLET BY MOUTH 4 TIMES DAILY. 360 tablet 0   norgestimate-ethinyl estradiol (VYLIBRA) 0.25-35 MG-MCG tablet Take 1 tablet by mouth daily. 84 tablet 0   nystatin-diphenhydrAMINE-alum & mag hydroxide-simeth Swish with 5 ml 4 times a day then spit (Patient not taking: Reported on 08/17/2022) 201 mL 0   Paliperidone Palmitate ER (INVEGA HAFYERA) 1560 MG/5ML SUSY Inject  1 syringe intramuscularly every 6 months 5 mL 1   Paliperidone Palmitate ER (INVEGA HAFYERA) 1560 MG/5ML SUSY Inject 5 mLs into the muscle every 6 (six) months. 5 mL 0   propranolol (INDERAL) 10 MG tablet Take 1/2 tablet (5 mg total) by mouth daily. 45 tablet 3   QUEtiapine (SEROQUEL) 100 MG tablet Take 1 tablet (100 mg total) by mouth at bedtime. 90 tablet 4   triamcinolone (KENALOG) 0.1 %  (Patient not taking: Reported on 08/17/2022)     valACYclovir (VALTREX) 1000 MG tablet Take 1 tablet (1,000 mg total) by mouth daily. 30 tablet 5   LORazepam (ATIVAN) 1 MG tablet Take 1 tablet (1 mg total) by mouth 3 (three) times daily. (Note: change in directions) (Patient not taking: Reported on 08/17/2022) 270 tablet 0   No facility-administered medications prior to visit.    Allergies  Allergen Reactions   Concerta [Methylphenidate] Other (See Comments)    hallucinations   Terbinafine Other (See Comments)   Trazodone And Nefazodone Other (See Comments)    Pt stated she has nightmares   Clindamycin Hcl Rash   Haldol [Haloperidol] Anxiety    Unable to seat still   Terbinafine Hcl Nausea And Vomiting    Review of Systems  Constitutional:  Negative for chills, fever and malaise/fatigue.  HENT:  Negative for congestion and hearing loss.   Eyes:   Negative for discharge.  Respiratory:  Negative for cough, sputum production and shortness of breath.   Cardiovascular:  Negative for chest pain, palpitations and leg swelling.  Gastrointestinal:  Negative for abdominal pain, blood in stool, constipation, diarrhea, heartburn, nausea and vomiting.  Genitourinary:  Positive for vaginal discharge. Negative for dysuria, frequency, hematuria and urgency.       (+)clear vaginal discharge (+)yellow discolored urine  Musculoskeletal:  Negative for back pain, falls and myalgias.  Skin:  Negative for rash.  Neurological:  Negative for dizziness, sensory change, loss of consciousness, weakness and headaches.  Endo/Heme/Allergies:  Negative for environmental allergies. Does not bruise/bleed easily.  Psychiatric/Behavioral:  Negative for depression and suicidal ideas. The patient is not nervous/anxious and does not have insomnia.        Objective:    Physical Exam Vitals and nursing note reviewed.  Constitutional:      Appearance: She is well-developed.  HENT:     Head: Normocephalic and atraumatic.  Eyes:     Conjunctiva/sclera: Conjunctivae normal.  Neck:     Thyroid: No thyromegaly.     Vascular: No carotid bruit or JVD.  Cardiovascular:     Rate and Rhythm: Normal rate and regular rhythm.     Heart sounds: Normal heart sounds. No murmur heard. Pulmonary:     Effort: Pulmonary effort is normal. No respiratory distress.     Breath sounds: Normal breath sounds. No wheezing or rales.  Chest:     Chest wall: No tenderness.  Genitourinary:    Vagina: No foreign body. Vaginal discharge present. No erythema, tenderness or bleeding.     Cervix: Discharge present. No cervical motion tenderness, friability, lesion, erythema, cervical bleeding or eversion.     Uterus: Normal.      Adnexa: Right adnexa normal and left adnexa normal.     Comments: D/c thin yellow d/c + odor  Swab done  Musculoskeletal:     Cervical back: Normal range of motion  and neck supple.  Neurological:     Mental Status: She is alert and oriented to person, place, and time.  BP 130/84 (BP Location: Left Arm, Patient Position: Sitting, Cuff Size: Large)   Pulse 95   Temp 98.4 F (36.9 C) (Oral)   Resp 18   Ht 5\' 4"  (1.626 m)   Wt 231 lb (104.8 kg)   SpO2 98%   BMI 39.65 kg/m  Wt Readings from Last 3 Encounters:  08/17/22 233 lb (105.7 kg)  08/17/22 231 lb (104.8 kg)  08/02/22 231 lb 12.8 oz (105.1 kg)       Assessment & Plan:  Vaginal discharge Assessment & Plan: Genprobe done Metrogel vag qhs x 5 days  Return to office as needed   Orders: -     Cervicovaginal ancillary only -     POCT Urinalysis Dipstick (Automated) -     POCT urine pregnancy -     metroNIDAZOLE; Place 1 Applicatorful vaginally at bedtime for 4 days. (Patient not taking: Reported on 08/17/2022)  Dispense: 70 g; Refill: 0    I, Ann Held, DO, personally preformed the services described in this documentation.  All medical record entries made by the scribe were at my direction and in my presence.  I have reviewed the chart and discharge instructions (if applicable) and agree that the record reflects my personal performance and is accurate and complete. 08/17/2022   I,[name],acting as a scribe for Ann Held, DO.,have documented all relevant documentation on the behalf of Ann Held, DO,as directed by  Ann Held, DO while in the presence of Ann Held, DO.   Ann Held, DO
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
## 2022-08-17 NOTE — Patient Instructions (Signed)

## 2022-08-17 NOTE — Patient Instructions (Signed)
Good to see you. Continue follow-up with Dr. Toy Care. Speak with Education officer, museum as planned. Follow-up in 1 year, call for any changes.

## 2022-08-17 NOTE — Assessment & Plan Note (Signed)
Genprobe done Metrogel vag qhs x 5 days  Return to office as needed

## 2022-08-20 ENCOUNTER — Telehealth: Payer: Self-pay | Admitting: Neurology

## 2022-08-20 ENCOUNTER — Other Ambulatory Visit (HOSPITAL_COMMUNITY): Payer: Self-pay

## 2022-08-20 LAB — CERVICOVAGINAL ANCILLARY ONLY
Bacterial Vaginitis (gardnerella): NEGATIVE
Candida Glabrata: NEGATIVE
Candida Vaginitis: NEGATIVE
Chlamydia: NEGATIVE
Comment: NEGATIVE
Comment: NEGATIVE
Comment: NEGATIVE
Comment: NEGATIVE
Comment: NEGATIVE
Comment: NORMAL
Neisseria Gonorrhea: NEGATIVE
Trichomonas: NEGATIVE

## 2022-08-20 NOTE — Telephone Encounter (Signed)
Pt called informed to let et Dr. Toy Care know about these symptoms and concerns of seroquel

## 2022-08-20 NOTE — Telephone Encounter (Signed)
Pt left a message. She says she is forgetting the date and sometimes when she goes out of her set medications she gets mood swings. She thinks its because of the Seroquel.

## 2022-08-20 NOTE — Telephone Encounter (Signed)
Pls let her know to let Dr. Toy Care know about these symptoms and concerns, thanks

## 2022-08-29 ENCOUNTER — Other Ambulatory Visit (HOSPITAL_BASED_OUTPATIENT_CLINIC_OR_DEPARTMENT_OTHER): Payer: Self-pay

## 2022-08-30 ENCOUNTER — Other Ambulatory Visit (HOSPITAL_COMMUNITY): Payer: Self-pay

## 2022-08-31 ENCOUNTER — Other Ambulatory Visit (HOSPITAL_COMMUNITY): Payer: Self-pay

## 2022-09-03 ENCOUNTER — Other Ambulatory Visit (HOSPITAL_BASED_OUTPATIENT_CLINIC_OR_DEPARTMENT_OTHER): Payer: Self-pay

## 2022-09-03 MED ORDER — INVEGA HAFYERA 1560 MG/5ML IM SUSY: 1560.0000 mg | PREFILLED_SYRINGE | INTRAMUSCULAR | 0 refills | Status: DC

## 2022-09-07 ENCOUNTER — Other Ambulatory Visit (HOSPITAL_COMMUNITY): Payer: Self-pay

## 2022-09-07 MED ORDER — LORAZEPAM 1 MG PO TABS
1.0000 mg | ORAL_TABLET | Freq: Three times a day (TID) | ORAL | 2 refills | Status: DC
  Filled 2022-09-07: qty 90, 30d supply, fill #0
  Filled 2022-10-08: qty 90, 30d supply, fill #1
  Filled 2022-11-07: qty 90, 30d supply, fill #2

## 2022-09-29 ENCOUNTER — Other Ambulatory Visit (HOSPITAL_COMMUNITY): Payer: Self-pay

## 2022-10-08 ENCOUNTER — Other Ambulatory Visit (HOSPITAL_COMMUNITY): Payer: Self-pay

## 2022-10-16 ENCOUNTER — Other Ambulatory Visit (HOSPITAL_COMMUNITY): Payer: Self-pay

## 2022-10-16 ENCOUNTER — Other Ambulatory Visit: Payer: Self-pay

## 2022-10-16 ENCOUNTER — Other Ambulatory Visit: Payer: Self-pay | Admitting: Family Medicine

## 2022-10-16 ENCOUNTER — Encounter: Payer: Self-pay | Admitting: Family Medicine

## 2022-10-16 MED ORDER — LEVOCETIRIZINE DIHYDROCHLORIDE 5 MG PO TABS
5.0000 mg | ORAL_TABLET | Freq: Every evening | ORAL | 3 refills | Status: DC
  Filled 2022-10-16: qty 90, 90d supply, fill #0
  Filled 2023-01-17 (×2): qty 90, 90d supply, fill #1
  Filled 2023-04-08: qty 90, 90d supply, fill #2

## 2022-10-17 ENCOUNTER — Other Ambulatory Visit: Payer: Self-pay | Admitting: Family Medicine

## 2022-10-17 ENCOUNTER — Other Ambulatory Visit (HOSPITAL_COMMUNITY): Payer: Self-pay

## 2022-10-17 DIAGNOSIS — U071 COVID-19: Secondary | ICD-10-CM

## 2022-10-17 MED ORDER — PROMETHAZINE-DM 6.25-15 MG/5ML PO SYRP
5.0000 mL | ORAL_SOLUTION | Freq: Four times a day (QID) | ORAL | 0 refills | Status: DC | PRN
  Filled 2022-10-17: qty 118, 6d supply, fill #0

## 2022-10-18 ENCOUNTER — Other Ambulatory Visit (HOSPITAL_COMMUNITY): Payer: Self-pay

## 2022-10-22 ENCOUNTER — Other Ambulatory Visit: Payer: Self-pay | Admitting: Family Medicine

## 2022-10-22 ENCOUNTER — Other Ambulatory Visit (HOSPITAL_COMMUNITY): Payer: Self-pay

## 2022-10-22 DIAGNOSIS — J302 Other seasonal allergic rhinitis: Secondary | ICD-10-CM

## 2022-10-22 MED ORDER — FLUTICASONE PROPIONATE 50 MCG/ACT NA SUSP
2.0000 | Freq: Every day | NASAL | 1 refills | Status: DC
  Filled 2022-10-22: qty 16, 30d supply, fill #0
  Filled 2022-11-27: qty 16, 30d supply, fill #1

## 2022-11-07 ENCOUNTER — Other Ambulatory Visit (HOSPITAL_COMMUNITY): Payer: Self-pay

## 2022-11-16 ENCOUNTER — Other Ambulatory Visit (HOSPITAL_COMMUNITY): Payer: Self-pay

## 2022-11-17 ENCOUNTER — Other Ambulatory Visit (HOSPITAL_COMMUNITY): Payer: Self-pay

## 2022-11-27 ENCOUNTER — Other Ambulatory Visit (HOSPITAL_COMMUNITY): Payer: Self-pay

## 2022-11-29 ENCOUNTER — Other Ambulatory Visit (HOSPITAL_COMMUNITY): Payer: Self-pay

## 2022-12-06 ENCOUNTER — Other Ambulatory Visit (HOSPITAL_COMMUNITY): Payer: Self-pay

## 2022-12-06 ENCOUNTER — Other Ambulatory Visit (HOSPITAL_BASED_OUTPATIENT_CLINIC_OR_DEPARTMENT_OTHER): Payer: Self-pay

## 2022-12-06 MED ORDER — LORAZEPAM 1 MG PO TABS
1.0000 mg | ORAL_TABLET | Freq: Three times a day (TID) | ORAL | 2 refills | Status: DC
  Filled 2022-12-07: qty 90, 30d supply, fill #0
  Filled 2023-01-08: qty 90, 30d supply, fill #1
  Filled 2023-02-23: qty 90, 30d supply, fill #2

## 2022-12-07 ENCOUNTER — Other Ambulatory Visit (HOSPITAL_COMMUNITY): Payer: Self-pay

## 2022-12-13 ENCOUNTER — Other Ambulatory Visit (HOSPITAL_COMMUNITY)
Admission: RE | Admit: 2022-12-13 | Discharge: 2022-12-13 | Disposition: A | Discharge status: Home / Self Care | Payer: PRIVATE HEALTH INSURANCE | Source: Ambulatory Visit | Attending: Family Medicine | Admitting: Family Medicine

## 2022-12-13 ENCOUNTER — Encounter: Payer: Self-pay | Admitting: Family Medicine

## 2022-12-13 ENCOUNTER — Ambulatory Visit (INDEPENDENT_AMBULATORY_CARE_PROVIDER_SITE_OTHER): Payer: No Typology Code available for payment source | Admitting: Family Medicine

## 2022-12-13 ENCOUNTER — Other Ambulatory Visit (HOSPITAL_COMMUNITY): Payer: Self-pay

## 2022-12-13 ENCOUNTER — Other Ambulatory Visit (HOSPITAL_BASED_OUTPATIENT_CLINIC_OR_DEPARTMENT_OTHER): Payer: Self-pay

## 2022-12-13 VITALS — BP 110/80 | HR 98 | Temp 98.4°F | Resp 18 | Ht 64.0 in | Wt 229.2 lb

## 2022-12-13 DIAGNOSIS — Z202 Contact with and (suspected) exposure to infections with a predominantly sexual mode of transmission: Secondary | ICD-10-CM | POA: Diagnosis not present

## 2022-12-13 DIAGNOSIS — J40 Bronchitis, not specified as acute or chronic: Secondary | ICD-10-CM

## 2022-12-13 DIAGNOSIS — N760 Acute vaginitis: Secondary | ICD-10-CM | POA: Insufficient documentation

## 2022-12-13 DIAGNOSIS — Z113 Encounter for screening for infections with a predominantly sexual mode of transmission: Secondary | ICD-10-CM | POA: Diagnosis not present

## 2022-12-13 DIAGNOSIS — N898 Other specified noninflammatory disorders of vagina: Secondary | ICD-10-CM

## 2022-12-13 DIAGNOSIS — R82998 Other abnormal findings in urine: Secondary | ICD-10-CM | POA: Diagnosis not present

## 2022-12-13 LAB — POC URINALSYSI DIPSTICK (AUTOMATED)
Bilirubin, UA: NEGATIVE
Blood, UA: NEGATIVE
Glucose, UA: NEGATIVE
Ketones, UA: NEGATIVE
Nitrite, UA: NEGATIVE
Protein, UA: NEGATIVE
Spec Grav, UA: 1.025 (ref 1.010–1.025)
Urobilinogen, UA: 0.2 E.U./dL
pH, UA: 5 (ref 5.0–8.0)

## 2022-12-13 LAB — POCT URINE PREGNANCY: Preg Test, Ur: NEGATIVE

## 2022-12-13 MED ORDER — AZITHROMYCIN 250 MG PO TABS
ORAL_TABLET | ORAL | 0 refills | Status: AC
  Filled 2022-12-13: qty 6, 5d supply, fill #0

## 2022-12-13 MED ORDER — FLUCONAZOLE 150 MG PO TABS
150.0000 mg | ORAL_TABLET | Freq: Every day | ORAL | 0 refills | Status: DC
  Filled 2022-12-13: qty 2, 2d supply, fill #0

## 2022-12-13 MED ORDER — BENZONATATE 200 MG PO CAPS
200.0000 mg | ORAL_CAPSULE | Freq: Two times a day (BID) | ORAL | 0 refills | Status: DC | PRN
  Filled 2022-12-13: qty 20, 10d supply, fill #0

## 2022-12-13 NOTE — Assessment & Plan Note (Signed)
Pt is not currently sexually active  Will check for all std at pt request

## 2022-12-13 NOTE — Assessment & Plan Note (Signed)
Z pak Tessalon perles  Consider xray

## 2022-12-13 NOTE — Assessment & Plan Note (Signed)
Check swab and labs

## 2022-12-13 NOTE — Progress Notes (Addendum)
Subjective:   By signing my name below, I, [name], attest that this documentation has been prepared under the direction and in the presence of [name], DO. 12/13/2022   Patient ID: [name], female    DOB: 1996/07/05, 27 y.o.   MRN: [id number]  Chief Complaint  Patient presents with   Cough    Cough started last week, non productive.     Cough Pertinent negatives include no chest pain, fever, headaches, rash or shortness of breath.   Patient is in today for a office visit.   She complains of non-productive cough for the past week. She went on a complete vegetarian diet a week ago. Prior to that she was on a vegan diet. She notes the main difference was she started eating dairy products. She is not taking any OTC medication to help manage her symptoms.  She also complains of occasional vaginal discharge. She is not sexually active and is no longer taking birth control medication. She has a previous sexual relations with her EX partner 1 month ago.  She also reports having mild dizziness from her anxiety and diet at this time.   Past Medical History:  Diagnosis Date   Acute vaginitis 05/20/2019   ADD (attention deficit disorder) 04/24/2011   ADHD (attention deficit hyperactivity disorder)    Allergy    Anxiety    Benzodiazepine dependence (HCC) 05/12/2020   Bipolar disorder (HCC)    Bipolar disorder, current episode manic severe with psychotic features (HCC)    Bipolar I disorder (HCC) 05/12/2020   Constipation 04/03/2018   Delirium tremens (HCC) 05/20/2019   Depression    Eczema 12/11/13   Genital herpes simplex 10/24/2017   High risk heterosexual behavior 05/20/2019   MDD (major depressive disorder), recurrent severe, without psychosis (HCC) 02/11/2018   Morbid obesity (HCC) 09/06/2019   Pelvic pain 01/31/2018   Possible pregnancy 09/06/2019   Preventative health care 03/16/2013   Schizo-affective schizophrenia (HCC)    Schizoaffective disorder (HCC)  03/08/2018   Seizure (HCC) 05/20/2019   Severe bipolar I disorder, current or most recent episode depressed (HCC) 03/30/2017   STD exposure 05/13/2015   Syncope 03/03/2017   Vitamin D deficiency 03/10/2019    No past surgical history on file.  Family History  Problem Relation Age of Onset   Asthma Mother    Depression Father    Stroke Father    Heart disease Father    Asthma Father    Hypertension Father        pulmonary hypertension   Heart disease Maternal Uncle    Leukemia Maternal Uncle    Diabetes Maternal Grandfather    Hypertension Maternal Grandmother    COPD Maternal Grandmother    Diabetes Paternal Grandfather    Stroke Paternal Grandmother    Diabetes Paternal Grandmother     Social History   Socioeconomic History   Marital status: Single    Spouse name: Not on file   Number of children: 0   Years of education: Not on file   Highest education level: Associate degree: academic program  Occupational History   Occupation: Unemployed  Tobacco Use   Smoking status: Never   Smokeless tobacco: Never  Vaping Use   Vaping Use: Former   Quit date: 09/01/2017  Substance and Sexual Activity   Alcohol use: Not Currently    Comment: occasionally   Drug use: Not Currently   Sexual activity: Not Currently    Birth control/protection: Pill  Other Topics Concern   Not on file  Social History Narrative   Lives with mom in an apartment on the second floor.  No children.  Currently not working.  Education: college. Left handed   Drinks no caffiene   Lives with mom, sista and niece   Social Determinants of Health   Financial Resource Strain: Low Risk  (07/11/2022)   Overall Financial Resource Strain (CARDIA)    Difficulty of Paying Living Expenses: Not hard at all  Food Insecurity: No Food Insecurity (07/11/2022)   Hunger Vital Sign    Worried About Running Out of Food in the Last Year: Never true    Ran Out of Food in the Last Year: Never true  Transportation Needs:  No Transportation Needs (07/11/2022)   PRAPARE - Administrator, Civil Service (Medical): No    Lack of Transportation (Non-Medical): No  Physical Activity: Inactive (07/11/2022)   Exercise Vital Sign    Days of Exercise per Week: 0 days    Minutes of Exercise per Session: 0 min  Stress: No Stress Concern Present (07/11/2022)   [employer] of Occupational Health - Occupational Stress Questionnaire    Feeling of Stress : Only a little  Social Connections: Moderately Isolated (07/11/2022)   Social Connection and Isolation Panel [NHANES]    Frequency of Communication with Friends and Family: More than three times a week    Frequency of Social Gatherings with Friends and Family: More than three times a week    Attends Religious Services: 1 to 4 times per year    Active Member of [employer] or Organizations: No    Attends Banker Meetings: Never    Marital Status: Never married  Intimate Partner Violence: Not At Risk (07/11/2022)   Humiliation, Afraid, Rape, and Kick questionnaire    Fear of Current or Ex-Partner: No    Emotionally Abused: No    Physically Abused: No    Sexually Abused: No    Outpatient Medications Prior to Visit  Medication Sig Dispense Refill   azelastine (ASTELIN) 0.1 % nasal spray Place 2 sprays into both nostrils daily as needed for rhinitis or allergies as directed 30 mL 12   buPROPion (WELLBUTRIN XL) 150 MG 24 hr tablet Take 1 tablet by mouth every morning 90 tablet 4   famotidine (PEPCID) 20 MG tablet Take 1 tablet (20 mg total) by mouth 2 (two) times daily. (Patient not taking: Reported on 08/17/2022) 60 tablet 2   fluticasone (FLONASE) 50 MCG/ACT nasal spray Place 2 sprays into both nostrils daily. 16 g 1   levocetirizine (XYZAL) 5 MG tablet Take 1 tablet (5 mg total) by mouth every evening. 90 tablet 3   LORazepam (ATIVAN) 1 MG tablet Take 1 tablet (1 mg) by mouth 3 times daily. 90 tablet 2   Paliperidone Palmitate ER (INVEGA HAFYERA)  1560 MG/5ML SUSY Inject 1 syringe intramuscularly every 6 months 5 mL 1   Paliperidone Palmitate ER (INVEGA HAFYERA) 1560 MG/5ML SUSY Inject 5 mLs into the muscle every 6 (six) months. 5 mL 0   Paliperidone Palmitate ER (INVEGA HAFYERA) 1560 MG/5ML SUSY Inject 1,560 mg into the muscle every 6 (six) months. 5 mL 0   propranolol (INDERAL) 10 MG tablet Take 1/2 tablet (5 mg total) by mouth daily. 45 tablet 3   QUEtiapine (SEROQUEL) 100 MG tablet Take 1 tablet (100 mg total) by mouth at bedtime. 90 tablet 4   triamcinolone (KENALOG) 0.1 %  (Patient not taking: Reported  on 08/17/2022)     valACYclovir (VALTREX) 1000 MG tablet Take 1 tablet (1,000 mg total) by mouth daily. 30 tablet 5   fluconazole (DIFLUCAN) 150 MG tablet Take one tablet (150 mg) by mouth now for 1 dose, may repeat in 3 days if still needed. (Patient not taking: Reported on 08/17/2022) 2 tablet 0   ibuprofen (ADVIL) 600 MG tablet Take 1 tablet (600 mg total) by mouth every 8 (eight) hours as needed. (Patient not taking: Reported on 08/17/2022) 15 tablet 0   LORazepam (ATIVAN) 1 MG tablet TAKE 1 TABLET BY MOUTH 4 TIMES DAILY. 360 tablet 0   metoprolol tartrate (LOPRESSOR) 25 MG tablet 1/2 tab po bid 30 tablet 0   norgestimate-ethinyl estradiol (VYLIBRA) 0.25-35 MG-MCG tablet Take 1 tablet by mouth daily. 84 tablet 0   nystatin-diphenhydrAMINE-alum & mag hydroxide-simeth Swish with 5 ml 4 times a day then spit (Patient not taking: Reported on 08/17/2022) 201 mL 0   OLANZapine zydis (ZYPREXA) 20 MG disintegrating tablet Take 1 tablet (20 mg total) by mouth at bedtime. 30 tablet 0   promethazine-dextromethorphan (PROMETHAZINE-DM) 6.25-15 MG/5ML syrup Take 5 mLs by mouth 4 (four) times daily as needed. 118 mL 0   No facility-administered medications prior to visit.    Allergies  Allergen Reactions   Concerta [Methylphenidate] Other (See Comments)    hallucinations   Terbinafine Other (See Comments)   Trazodone And Nefazodone Other (See  Comments)    Pt stated she has nightmares   Clindamycin Hcl Rash   Haldol [Haloperidol] Anxiety    Unable to seat still   Terbinafine Hcl Nausea And Vomiting    Review of Systems  Constitutional:  Negative for fever and malaise/fatigue.  HENT:  Negative for congestion.   Eyes:  Negative for blurred vision.  Respiratory:  Positive for cough. Negative for shortness of breath.   Cardiovascular:  Negative for chest pain, palpitations and leg swelling.  Gastrointestinal:  Negative for vomiting.  Genitourinary:        (+)vaginal discharge  Musculoskeletal:  Negative for back pain.  Skin:  Negative for rash.  Neurological:  Positive for dizziness (mild). Negative for loss of consciousness and headaches.       Objective:    Physical Exam Vitals and nursing note reviewed.  Constitutional:      General: She is not in acute distress.    Appearance: Normal appearance. She is well-developed. She is not ill-appearing.  HENT:     Head: Normocephalic and atraumatic.     Right Ear: External ear normal.     Left Ear: External ear normal.  Eyes:     Extraocular Movements: Extraocular movements intact.     Conjunctiva/sclera: Conjunctivae normal.     Pupils: Pupils are equal, round, and reactive to light.  Neck:     Thyroid: No thyromegaly.     Vascular: No carotid bruit or JVD.  Cardiovascular:     Rate and Rhythm: Normal rate and regular rhythm.     Heart sounds: Normal heart sounds. No murmur heard.    No gallop.  Pulmonary:     Effort: Pulmonary effort is normal. No respiratory distress.     Breath sounds: Normal breath sounds. No wheezing or rales.  Chest:     Chest wall: No tenderness.  Musculoskeletal:     Cervical back: Normal range of motion and neck supple.  Skin:    General: Skin is warm and dry.  Neurological:     General: No focal deficit  present.     Mental Status: She is alert and oriented to person, place, and time.  Psychiatric:        Mood and Affect: Mood  normal.        Judgment: Judgment normal.     BP 110/80 (BP Location: Left Arm, Patient Position: Sitting, Cuff Size: Large)   Pulse 98   Temp 98.4 F (36.9 C) (Oral)   Resp 18   Ht 5\' 4"  (1.626 m)   Wt 229 lb 3.2 oz (104 kg)   SpO2 97%   BMI 39.34 kg/m  Wt Readings from Last 3 Encounters:  12/13/22 229 lb 3.2 oz (104 kg)  08/17/22 233 lb (105.7 kg)  08/17/22 231 lb (104.8 kg)       Assessment & Plan:  Acute vaginitis -     POCT Urinalysis Dipstick (Automated) -     HepB+HepC+HIV Panel -     Cervicovaginal ancillary only -     Fluconazole; Take 1 tablet (150 mg total) by mouth daily.  Dispense: 2 tablet; Refill: 0 -     POCT urine pregnancy  Bronchitis Assessment & Plan: Z pak Tessalon perles  Consider xray     Orders: -     Azithromycin; Take 2 tablets (500 mg total) by mouth daily for 1 day, THEN 1 tablet (250 mg total) daily for 4 days.  Dispense: 6 tablet; Refill: 0 -     Benzonatate; Take 1 capsule (200 mg total) by mouth 2 (two) times daily as needed for cough.  Dispense: 20 capsule; Refill: 0  Vaginal discharge Assessment & Plan: Pt is not currently sexually active  Will check for all std at pt request    Leukocytes in urine -     Urine Culture  STD exposure Assessment & Plan: Check swab and labs      I, [name], DO, personally preformed the services described in this documentation.  All medical record entries made by the scribe were at my direction and in my presence.  I have reviewed the chart and discharge instructions (if applicable) and agree that the record reflects my personal performance and is accurate and complete. 12/13/2022   I,[name],acting as a scribe for [name], DO.,have documented all relevant documentation on the behalf of [name], DO,as directed by  [name], DO while in the presence of [name], DO.   [name], DO

## 2022-12-14 LAB — CERVICOVAGINAL ANCILLARY ONLY
Bacterial Vaginitis (gardnerella): NEGATIVE
Candida Glabrata: NEGATIVE
Candida Vaginitis: NEGATIVE
Chlamydia: NEGATIVE
Comment: NEGATIVE
Comment: NEGATIVE
Comment: NEGATIVE
Comment: NEGATIVE
Comment: NEGATIVE
Comment: NORMAL
Neisseria Gonorrhea: NEGATIVE
Trichomonas: NEGATIVE

## 2022-12-14 LAB — URINE CULTURE
MICRO NUMBER:: 14904996
SPECIMEN QUALITY:: ADEQUATE

## 2022-12-14 LAB — HEPB+HEPC+HIV PANEL
HIV Screen 4th Generation wRfx: NONREACTIVE
Hep B C IgM: NEGATIVE
Hep B Core Total Ab: NEGATIVE
Hep B E Ab: NONREACTIVE
Hep B E Ag: NEGATIVE
Hep B Surface Ab, Qual: NONREACTIVE
Hep C Virus Ab: NONREACTIVE
Hepatitis B Surface Ag: NEGATIVE

## 2023-01-02 ENCOUNTER — Other Ambulatory Visit (HOSPITAL_COMMUNITY): Payer: Self-pay

## 2023-01-02 ENCOUNTER — Other Ambulatory Visit: Payer: Self-pay | Admitting: Family Medicine

## 2023-01-02 ENCOUNTER — Other Ambulatory Visit (HOSPITAL_BASED_OUTPATIENT_CLINIC_OR_DEPARTMENT_OTHER): Payer: Self-pay

## 2023-01-02 DIAGNOSIS — N760 Acute vaginitis: Secondary | ICD-10-CM

## 2023-01-02 MED ORDER — INVEGA HAFYERA 1560 MG/5ML IM SUSY: 1560.0000 mg | PREFILLED_SYRINGE | INTRAMUSCULAR | 0 refills | Status: DC

## 2023-01-04 ENCOUNTER — Other Ambulatory Visit (HOSPITAL_COMMUNITY): Payer: Self-pay

## 2023-01-08 ENCOUNTER — Other Ambulatory Visit: Payer: Self-pay

## 2023-01-09 ENCOUNTER — Other Ambulatory Visit (HOSPITAL_COMMUNITY): Payer: Self-pay

## 2023-01-10 ENCOUNTER — Other Ambulatory Visit (HOSPITAL_COMMUNITY)
Admission: RE | Admit: 2023-01-10 | Discharge: 2023-01-10 | Disposition: A | Discharge status: Home / Self Care | Payer: No Typology Code available for payment source | Source: Ambulatory Visit | Attending: Family Medicine | Admitting: Family Medicine

## 2023-01-10 ENCOUNTER — Encounter: Payer: Self-pay | Admitting: Family Medicine

## 2023-01-10 ENCOUNTER — Ambulatory Visit (INDEPENDENT_AMBULATORY_CARE_PROVIDER_SITE_OTHER): Payer: No Typology Code available for payment source | Admitting: Family Medicine

## 2023-01-10 ENCOUNTER — Other Ambulatory Visit (HOSPITAL_BASED_OUTPATIENT_CLINIC_OR_DEPARTMENT_OTHER): Payer: Self-pay

## 2023-01-10 VITALS — BP 110/80 | HR 98 | Temp 98.0°F | Resp 18 | Ht 64.0 in | Wt 233.4 lb

## 2023-01-10 DIAGNOSIS — R109 Unspecified abdominal pain: Secondary | ICD-10-CM | POA: Insufficient documentation

## 2023-01-10 DIAGNOSIS — R8290 Unspecified abnormal findings in urine: Secondary | ICD-10-CM | POA: Diagnosis not present

## 2023-01-10 DIAGNOSIS — N898 Other specified noninflammatory disorders of vagina: Secondary | ICD-10-CM | POA: Diagnosis not present

## 2023-01-10 LAB — POC URINALSYSI DIPSTICK (AUTOMATED)
Bilirubin, UA: NEGATIVE
Blood, UA: NEGATIVE
Glucose, UA: NEGATIVE
Ketones, UA: NEGATIVE
Nitrite, UA: NEGATIVE
Protein, UA: NEGATIVE
Spec Grav, UA: 1.015 (ref 1.010–1.025)
Urobilinogen, UA: 1 E.U./dL
pH, UA: 6 (ref 5.0–8.0)

## 2023-01-10 MED ORDER — FLUCONAZOLE 150 MG PO TABS
150.0000 mg | ORAL_TABLET | Freq: Every day | ORAL | 0 refills | Status: DC
Start: 2023-01-10 — End: 2023-01-10
  Filled 2023-01-10: qty 2, 2d supply, fill #0

## 2023-01-10 MED ORDER — FLUCONAZOLE 150 MG PO TABS
150.0000 mg | ORAL_TABLET | Freq: Every day | ORAL | 0 refills | Status: DC
Start: 2023-01-10 — End: 2023-01-17
  Filled 2023-01-10: qty 2, 3d supply, fill #0

## 2023-01-10 NOTE — Assessment & Plan Note (Signed)
Self swab  Diflucan sent in  Consider gyn if swab comes back abn

## 2023-01-10 NOTE — Progress Notes (Signed)
Subjective:   By signing my name below, I, Doylene Bode, attest that this documentation has been prepared under the direction and in the presence of [name], DO 01/10/23   Patient ID: [name], female    DOB: 26-Feb-1996, 27 y.o.   MRN: [id number]  Chief Complaint  Patient presents with   Vaginal Discharge    X2 weeks, pt states having clear discharge, fatigued, and patients having odor.     Patient is in today for an office visit.  Yeast Infection Has been having clear/white discharge with odor. Has been seen here previously for similar symptoms. Using regular soap.  Past Medical History:  Diagnosis Date   Acute vaginitis 05/20/2019   ADD (attention deficit disorder) 04/24/2011   ADHD (attention deficit hyperactivity disorder)    Allergy    Anxiety    Benzodiazepine dependence (HCC) 05/12/2020   Bipolar disorder (HCC)    Bipolar disorder, current episode manic severe with psychotic features (HCC)    Bipolar I disorder (HCC) 05/12/2020   Constipation 04/03/2018   Delirium tremens (HCC) 05/20/2019   Depression    Eczema 12/11/13   Genital herpes simplex 10/24/2017   High risk heterosexual behavior 05/20/2019   MDD (major depressive disorder), recurrent severe, without psychosis (HCC) 02/11/2018   Morbid obesity (HCC) 09/06/2019   Pelvic pain 01/31/2018   Possible pregnancy 09/06/2019   Preventative health care 03/16/2013   Schizo-affective schizophrenia (HCC)    Schizoaffective disorder (HCC) 03/08/2018   Seizure (HCC) 05/20/2019   Severe bipolar I disorder, current or most recent episode depressed (HCC) 03/30/2017   STD exposure 05/13/2015   Syncope 03/03/2017   Vitamin D deficiency 03/10/2019    No past surgical history on file.  Family History  Problem Relation Age of Onset   Asthma Mother    Depression Father    Stroke Father    Heart disease Father    Asthma Father    Hypertension Father        pulmonary hypertension   Heart disease Maternal Uncle     Leukemia Maternal Uncle    Diabetes Maternal Grandfather    Hypertension Maternal Grandmother    COPD Maternal Grandmother    Diabetes Paternal Grandfather    Stroke Paternal Grandmother    Diabetes Paternal Grandmother     Social History   Socioeconomic History   Marital status: Single    Spouse name: Not on file   Number of children: 0   Years of education: Not on file   Highest education level: Associate degree: academic program  Occupational History   Occupation: Unemployed  Tobacco Use   Smoking status: Never   Smokeless tobacco: Never  Vaping Use   Vaping Use: Former   Quit date: 09/01/2017  Substance and Sexual Activity   Alcohol use: Not Currently    Comment: occasionally   Drug use: Not Currently   Sexual activity: Not Currently    Birth control/protection: Pill  Other Topics Concern   Not on file  Social History Narrative   Lives with mom in an apartment on the second floor.  No children.  Currently not working.  Education: college. Left handed   Drinks no caffiene   Lives with mom, sista and niece   Social Determinants of Health   Financial Resource Strain: Low Risk  (07/11/2022)   Overall Financial Resource Strain (CARDIA)    Difficulty of Paying Living Expenses: Not hard at all  Food Insecurity: No Food Insecurity (07/11/2022)  Hunger Vital Sign    Worried About Running Out of Food in the Last Year: Never true    Ran Out of Food in the Last Year: Never true  Transportation Needs: No Transportation Needs (07/11/2022)   PRAPARE - Administrator, Civil Service (Medical): No    Lack of Transportation (Non-Medical): No  Physical Activity: Inactive (07/11/2022)   Exercise Vital Sign    Days of Exercise per Week: 0 days    Minutes of Exercise per Session: 0 min  Stress: No Stress Concern Present (07/11/2022)   [employer] of Occupational Health - Occupational Stress Questionnaire    Feeling of Stress : Only a little  Social  Connections: Moderately Isolated (07/11/2022)   Social Connection and Isolation Panel [NHANES]    Frequency of Communication with Friends and Family: More than three times a week    Frequency of Social Gatherings with Friends and Family: More than three times a week    Attends Religious Services: 1 to 4 times per year    Active Member of [employer] or Organizations: No    Attends Banker Meetings: Never    Marital Status: Never married  Intimate Partner Violence: Not At Risk (07/11/2022)   Humiliation, Afraid, Rape, and Kick questionnaire    Fear of Current or Ex-Partner: No    Emotionally Abused: No    Physically Abused: No    Sexually Abused: No    Outpatient Medications Prior to Visit  Medication Sig Dispense Refill   azelastine (ASTELIN) 0.1 % nasal spray Place 2 sprays into both nostrils daily as needed for rhinitis or allergies as directed 30 mL 12   benzonatate (TESSALON) 200 MG capsule Take 1 capsule (200 mg total) by mouth 2 (two) times daily as needed for cough. 20 capsule 0   buPROPion (WELLBUTRIN XL) 150 MG 24 hr tablet Take 1 tablet by mouth every morning 90 tablet 4   fluticasone (FLONASE) 50 MCG/ACT nasal spray Place 2 sprays into both nostrils daily. 16 g 1   levocetirizine (XYZAL) 5 MG tablet Take 1 tablet (5 mg total) by mouth every evening. 90 tablet 3   LORazepam (ATIVAN) 1 MG tablet Take 1 tablet (1 mg) by mouth 3 times daily. 90 tablet 2   Paliperidone Palmitate ER (INVEGA HAFYERA) 1560 MG/5ML SUSY Inject 1 syringe intramuscularly every 6 months 5 mL 1   propranolol (INDERAL) 10 MG tablet Take 1/2 tablet (5 mg total) by mouth daily. 45 tablet 3   QUEtiapine (SEROQUEL) 100 MG tablet Take 1 tablet (100 mg total) by mouth at bedtime. 90 tablet 4   valACYclovir (VALTREX) 1000 MG tablet Take 1 tablet (1,000 mg total) by mouth daily. 30 tablet 5   Paliperidone Palmitate ER (INVEGA HAFYERA) 1560 MG/5ML SUSY Inject 5 mLs into the muscle every 6 (six) months. 5 mL 0    Paliperidone Palmitate ER (INVEGA HAFYERA) 1560 MG/5ML SUSY Inject 1,560 mg into the muscle every 6 (six) months. 5 mL 0   Paliperidone Palmitate ER (INVEGA HAFYERA) 1560 MG/5ML SUSY Inject 1,560 mg into the muscle every 6 (six) months. 5 mL 0   famotidine (PEPCID) 20 MG tablet Take 1 tablet (20 mg total) by mouth 2 (two) times daily. (Patient not taking: Reported on 08/17/2022) 60 tablet 2   fluconazole (DIFLUCAN) 150 MG tablet Take 1 tablet (150 mg total) by mouth daily. (Patient not taking: Reported on 01/10/2023) 2 tablet 0   triamcinolone (KENALOG) 0.1 %  (Patient not taking:  Reported on 08/17/2022)     No facility-administered medications prior to visit.    Allergies  Allergen Reactions   Concerta [Methylphenidate] Other (See Comments)    hallucinations   Terbinafine Other (See Comments)   Trazodone And Nefazodone Other (See Comments)    Pt stated she has nightmares   Clindamycin Hcl Rash   Haldol [Haloperidol] Anxiety    Unable to seat still   Terbinafine Hcl Nausea And Vomiting    Review of Systems  Constitutional:  Negative for chills, fever and malaise/fatigue.  HENT:  Negative for congestion and hearing loss.   Eyes:  Negative for blurred vision and discharge.  Respiratory:  Negative for cough, sputum production and shortness of breath.   Cardiovascular:  Negative for chest pain, palpitations and leg swelling.  Gastrointestinal:  Negative for abdominal pain, blood in stool, constipation, diarrhea, heartburn, nausea and vomiting.  Genitourinary:  Negative for dysuria, frequency, hematuria and urgency.       (+) Clear/white discharge (+) Vaginal odor  Musculoskeletal:  Negative for back pain, falls and myalgias.  Skin:  Negative for rash.  Neurological:  Negative for dizziness, sensory change, loss of consciousness, weakness and headaches.  Endo/Heme/Allergies:  Negative for environmental allergies. Does not bruise/bleed easily.  Psychiatric/Behavioral:  Negative for  depression and suicidal ideas. The patient is not nervous/anxious and does not have insomnia.        Objective:    Physical Exam Vitals and nursing note reviewed.  Constitutional:      Appearance: Normal appearance. She is well-developed.  HENT:     Head: Normocephalic and atraumatic.  Eyes:     General: Lids are normal.     Extraocular Movements: Extraocular movements intact.     Conjunctiva/sclera: Conjunctivae normal.  Neck:     Thyroid: No thyromegaly.     Vascular: No carotid bruit or JVD.  Cardiovascular:     Rate and Rhythm: Normal rate and regular rhythm.     Heart sounds: Normal heart sounds. No murmur heard. Pulmonary:     Effort: Pulmonary effort is normal. No respiratory distress.     Breath sounds: Normal breath sounds. No wheezing or rales.  Chest:     Chest wall: No tenderness.  Musculoskeletal:        General: Normal range of motion.     Cervical back: Normal range of motion and neck supple.  Skin:    General: Skin is warm and dry.  Neurological:     General: No focal deficit present.     Mental Status: She is alert and oriented to person, place, and time.  Psychiatric:        Attention and Perception: Attention and perception normal.        Mood and Affect: Mood normal.        Behavior: Behavior normal.        Thought Content: Thought content normal.        Judgment: Judgment normal.     BP 110/80 (BP Location: Left Arm, Patient Position: Sitting, Cuff Size: Large)   Pulse 98   Temp 98 F (36.7 C) (Oral)   Resp 18   Ht 5\' 4"  (1.626 m)   Wt 233 lb 6.4 oz (105.9 kg)   SpO2 98%   BMI 40.06 kg/m  Wt Readings from Last 3 Encounters:  01/10/23 233 lb 6.4 oz (105.9 kg)  12/13/22 229 lb 3.2 oz (104 kg)  08/17/22 233 lb (105.7 kg)       Assessment &  Plan:  Vaginal discharge Assessment & Plan: Self swab  Diflucan sent in  Consider gyn if swab comes back abn  Orders: -     Cervicovaginal ancillary only -     Fluconazole; Take 1 tablet (150  mg total) by mouth daily. May repeat in 3 days if needed.  Dispense: 2 tablet; Refill: 0  Abdominal pressure -     POCT Urinalysis Dipstick (Automated)  Abnormal urine -     Urine Culture   I, [name], DO, have reviewed all documentation for this visit. The documentation on 01/10/23 for the exam, diagnosis, procedures, and orders are all accurate and complete.   I,Alexander Ruley,acting as a Neurosurgeon for [employer], DO.,have documented all relevant documentation on the behalf of [name], DO,as directed by  [name], DO while in the presence of [name], DO.

## 2023-01-11 LAB — URINE CULTURE
MICRO NUMBER:: 15020216
SPECIMEN QUALITY:: ADEQUATE

## 2023-01-14 LAB — CERVICOVAGINAL ANCILLARY ONLY
Bacterial Vaginitis (gardnerella): NEGATIVE
Candida Glabrata: NEGATIVE
Candida Vaginitis: NEGATIVE
Chlamydia: NEGATIVE
Comment: NEGATIVE
Comment: NEGATIVE
Comment: NEGATIVE
Comment: NEGATIVE
Comment: NEGATIVE
Comment: NORMAL
Neisseria Gonorrhea: NEGATIVE
Trichomonas: NEGATIVE

## 2023-01-17 ENCOUNTER — Encounter: Payer: Self-pay | Admitting: Family Medicine

## 2023-01-17 ENCOUNTER — Other Ambulatory Visit (HOSPITAL_BASED_OUTPATIENT_CLINIC_OR_DEPARTMENT_OTHER): Payer: Self-pay

## 2023-01-17 ENCOUNTER — Ambulatory Visit (HOSPITAL_BASED_OUTPATIENT_CLINIC_OR_DEPARTMENT_OTHER)
Admission: RE | Admit: 2023-01-17 | Discharge: 2023-01-17 | Disposition: A | Discharge status: Home / Self Care | Payer: No Typology Code available for payment source | Source: Ambulatory Visit | Attending: Family Medicine | Admitting: Family Medicine

## 2023-01-17 ENCOUNTER — Ambulatory Visit: Payer: No Typology Code available for payment source | Admitting: Family Medicine

## 2023-01-17 VITALS — BP 104/75 | HR 72 | Temp 98.3°F | Resp 18 | Ht 64.0 in | Wt 234.2 lb

## 2023-01-17 DIAGNOSIS — J40 Bronchitis, not specified as acute or chronic: Secondary | ICD-10-CM | POA: Insufficient documentation

## 2023-01-17 MED ORDER — PREDNISONE 10 MG PO TABS
ORAL_TABLET | ORAL | 0 refills | Status: DC
Start: 2023-01-17 — End: 2023-05-30
  Filled 2023-01-17: qty 20, fill #0
  Filled 2023-01-17: qty 20, 12d supply, fill #0

## 2023-01-17 MED ORDER — ALBUTEROL SULFATE HFA 108 (90 BASE) MCG/ACT IN AERS
2.0000 | INHALATION_SPRAY | Freq: Four times a day (QID) | RESPIRATORY_TRACT | 0 refills | Status: AC | PRN
Start: 2023-01-17 — End: ?
  Filled 2023-01-17: qty 13.4, fill #0
  Filled 2023-01-17: qty 18, 25d supply, fill #0

## 2023-01-17 MED ORDER — QVAR REDIHALER 40 MCG/ACT IN AERB
2.0000 | INHALATION_SPRAY | Freq: Two times a day (BID) | RESPIRATORY_TRACT | 2 refills | Status: DC
Start: 2023-01-17 — End: 2023-04-22
  Filled 2023-01-17: qty 10.6, 30d supply, fill #0
  Filled 2023-01-17: qty 1, fill #0
  Filled 2023-02-22: qty 10.6, 30d supply, fill #1
  Filled 2023-03-25: qty 10.6, 30d supply, fill #2

## 2023-01-17 MED ORDER — BENZONATATE 200 MG PO CAPS
200.0000 mg | ORAL_CAPSULE | Freq: Two times a day (BID) | ORAL | 0 refills | Status: DC | PRN
Start: 2023-01-17 — End: 2023-05-30
  Filled 2023-01-17 (×2): qty 20, 10d supply, fill #0

## 2023-01-17 NOTE — Assessment & Plan Note (Signed)
Check cxr today Pred taper  Qvar and albuterol  F/u as needed

## 2023-01-17 NOTE — Patient Instructions (Signed)
Bronchospasm, Adult  Bronchospasm is a tightening of the smooth muscle that wraps around the small airways in the lungs. When the muscle tightens, the small airways narrow. Narrowed airways limit the air you breathe in or out of your lungs. Inflammation (swelling) and more mucus (sputum) than usual can further irritate the airways. This can make it very hard to breathe. Bronchospasm can happen suddenly or over a period of time. What are the causes? Common causes of this condition include: An infection, such as a cold or sinus drainage. Exercise. Strong odors from aerosol sprays, and fumes from perfume, candles, and household cleaners. Cold air. Stress or strong emotions such as crying or laughing. What increases the risk? The following factors may make you more likely to develop this condition: Having asthma. Smoking or being around someone who smokes (secondhand smoke). Seasonal allergies, such as pollen or mold. Allergic reaction (anaphylaxis) to food, medicine, or insect bites or stings. What are the signs or symptoms? Symptoms of this condition include: Making a high-pitched whistling sound when you breathe, most often when you breathe out (wheezing). Coughing. Chest tightness. Shortness of breath. Decreased ability to exercise. Noisy breathing or a high-pitched cough. How is this diagnosed? This condition may be diagnosed based on your medical history and a physical exam. Your health care provider may also perform tests, including: A chest X-ray. Lung function tests. How is this treated? This condition may be treated by: Using inhaled medicines. These open up (relax) the airways and help you breathe. They can be taken with a metered dose inhaler or a nebulizer device. Taking corticosteroid medicines. These may be given to reduce inflammation and swelling. Removing the irritant or trigger that started the bronchospasm. Follow these instructions at home: Medicines Take  over-the-counter and prescription medicines only as told by your health care provider. If you need to use an inhaler or nebulizer to take your medicine, ask your health care provider how to use it correctly. You may be given a spacer to use with your inhaler. This makes it easier to get the medicine from the inhaler into your lungs. Lifestyle Do not use any products that contain nicotine or tobacco. These products include cigarettes, chewing tobacco, and vaping devices, such as e-cigarettes. If you need help quitting, ask your health care provider. Keep track of things that trigger your bronchospasm. Avoid these if possible. When pollen, air pollution, or humidity levels are bad, keep windows closed and use an air conditioner or go to places that have air conditioning. Find ways to manage stress and your emotions, such as mindfulness, relaxation, or breathing exercises. Activity Some people have bronchospasm when they exercise. This is called exercise-induced bronchoconstriction (EIB). If you have this problem, talk with your health care provider about how to manage EIB. Some tips include: Using your fast-acting inhaler before exercise. Exercising indoors if it is very cold or humid, or if the pollen and mold counts are high. Warming up and cool down before and after exercise. Stopping exercising right away if your symptoms start or get worse. General instructions If you have asthma, make sure you have an asthma action plan. Stay up to date on your immunizations. Keep all follow-up visits. This is important. Get help right away if: You have trouble breathing. Your wheezing and coughing do not get better after taking your medicine. You have chest pain. You have trouble speaking more than one-word sentences. These symptoms may be an emergency. Get help right away. Call 911. Do not wait to  see if the symptoms will go away. Do not drive yourself to the hospital. Summary Bronchospasm is a  tightening of the smooth muscle that wraps around the small airways in the lungs. Some people have bronchospasm when they exercise. This is called exercise-induced bronchoconstriction (EIB). If you have this problem, talk with your health care provider about how to manage EIB. Do not use any products that contain nicotine or tobacco. These products include cigarettes, chewing tobacco, and vaping devices, such as e-cigarettes. If you need help quitting, ask your health care provider. Get help right away if your wheezing and coughing do not get better after taking your medicine. This information is not intended to replace advice given to you by your health care provider. Make sure you discuss any questions you have with your health care provider. Document Revised: 02/20/2021 Document Reviewed: 02/20/2021 Elsevier Patient Education  2024 [employer].

## 2023-01-17 NOTE — Progress Notes (Signed)
Subjective:   By signing my name below, I, [name], attest that this documentation has been prepared under the direction and in the presence of Jaclyn Schultz, DO 01/17/23   Patient ID: [name], female    DOB: 08-04-1996, 27 y.o.   MRN: [id number]  Chief Complaint  Patient presents with   Cough    Pt was seen on 5/2 for Bronchitis. Rx Zpak and Benzonatate 200. Pt says the cough is deep and is dry.    HPI Patient is in today for evaluation of persistent cough. She had COVID-19 in 09/2022 and has had a lingering cough since that time. She has tried a Sales promotion account executive with minimal relief. She has intermittent, severe coughing spells particularly when lying down. She has not had any fevers.  Past Medical History:  Diagnosis Date   Acute vaginitis 05/20/2019   ADD (attention deficit disorder) 04/24/2011   ADHD (attention deficit hyperactivity disorder)    Allergy    Anxiety    Benzodiazepine dependence (HCC) 05/12/2020   Bipolar disorder (HCC)    Bipolar disorder, current episode manic severe with psychotic features (HCC)    Bipolar I disorder (HCC) 05/12/2020   Constipation 04/03/2018   Delirium tremens (HCC) 05/20/2019   Depression    Eczema 12/11/13   Genital herpes simplex 10/24/2017   High risk heterosexual behavior 05/20/2019   MDD (major depressive disorder), recurrent severe, without psychosis (HCC) 02/11/2018   Morbid obesity (HCC) 09/06/2019   Pelvic pain 01/31/2018   Possible pregnancy 09/06/2019   Preventative health care 03/16/2013   Schizo-affective schizophrenia (HCC)    Schizoaffective disorder (HCC) 03/08/2018   Seizure (HCC) 05/20/2019   Severe bipolar I disorder, current or most recent episode depressed (HCC) 03/30/2017   STD exposure 05/13/2015   Syncope 03/03/2017   Vitamin D deficiency 03/10/2019    No past surgical history on file.  Family History  Problem Relation Age of Onset   Asthma Mother    Depression Father    Stroke Father     Heart disease Father    Asthma Father    Hypertension Father        pulmonary hypertension   Heart disease Maternal Uncle    Leukemia Maternal Uncle    Diabetes Maternal Grandfather    Hypertension Maternal Grandmother    COPD Maternal Grandmother    Diabetes Paternal Grandfather    Stroke Paternal Grandmother    Diabetes Paternal Grandmother     Social History   Socioeconomic History   Marital status: Single    Spouse name: Not on file   Number of children: 0   Years of education: Not on file   Highest education level: Associate degree: academic program  Occupational History   Occupation: Unemployed  Tobacco Use   Smoking status: Never   Smokeless tobacco: Never  Vaping Use   Vaping Use: Former   Quit date: 09/01/2017  Substance and Sexual Activity   Alcohol use: Not Currently    Comment: occasionally   Drug use: Not Currently   Sexual activity: Not Currently    Birth control/protection: Pill  Other Topics Concern   Not on file  Social History Narrative   Lives with mom in an apartment on the second floor.  No children.  Currently not working.  Education: college. Left handed   Drinks no caffiene   Lives with mom, sista and niece   Social Determinants of Health   Financial Resource Strain: Low Risk  (  07/11/2022)   Overall Financial Resource Strain (CARDIA)    Difficulty of Paying Living Expenses: Not hard at all  Food Insecurity: No Food Insecurity (07/11/2022)   Hunger Vital Sign    Worried About Running Out of Food in the Last Year: Never true    Ran Out of Food in the Last Year: Never true  Transportation Needs: No Transportation Needs (07/11/2022)   PRAPARE - Administrator, Civil Service (Medical): No    Lack of Transportation (Non-Medical): No  Physical Activity: Inactive (07/11/2022)   Exercise Vital Sign    Days of Exercise per Week: 0 days    Minutes of Exercise per Session: 0 min  Stress: No Stress Concern Present (07/11/2022)    [employer] of Occupational Health - Occupational Stress Questionnaire    Feeling of Stress : Only a little  Social Connections: Moderately Isolated (07/11/2022)   Social Connection and Isolation Panel [NHANES]    Frequency of Communication with Friends and Family: More than three times a week    Frequency of Social Gatherings with Friends and Family: More than three times a week    Attends Religious Services: 1 to 4 times per year    Active Member of [employer] or Organizations: No    Attends Banker Meetings: Never    Marital Status: Never married  Intimate Partner Violence: Not At Risk (07/11/2022)   Humiliation, Afraid, Rape, and Kick questionnaire    Fear of Current or Ex-Partner: No    Emotionally Abused: No    Physically Abused: No    Sexually Abused: No    Outpatient Medications Prior to Visit  Medication Sig Dispense Refill   azelastine (ASTELIN) 0.1 % nasal spray Place 2 sprays into both nostrils daily as needed for rhinitis or allergies as directed 30 mL 12   buPROPion (WELLBUTRIN XL) 150 MG 24 hr tablet Take 1 tablet by mouth every morning 90 tablet 4   fluticasone (FLONASE) 50 MCG/ACT nasal spray Place 2 sprays into both nostrils daily. 16 g 1   levocetirizine (XYZAL) 5 MG tablet Take 1 tablet (5 mg total) by mouth every evening. 90 tablet 3   LORazepam (ATIVAN) 1 MG tablet Take 1 tablet (1 mg) by mouth 3 times daily. 90 tablet 2   Paliperidone Palmitate ER (INVEGA HAFYERA) 1560 MG/5ML SUSY Inject 1 syringe intramuscularly every 6 months 5 mL 1   propranolol (INDERAL) 10 MG tablet Take 1/2 tablet (5 mg total) by mouth daily. 45 tablet 3   QUEtiapine (SEROQUEL) 25 MG tablet Take 25 mg by mouth at bedtime.     valACYclovir (VALTREX) 1000 MG tablet Take 1 tablet (1,000 mg total) by mouth daily. 30 tablet 5   fluconazole (DIFLUCAN) 150 MG tablet Take 1 tablet (150 mg total) by mouth daily. May repeat in 3 days if needed. 2 tablet 0   QUEtiapine (SEROQUEL) 100 MG  tablet Take 1 tablet (100 mg total) by mouth at bedtime. 90 tablet 4   benzonatate (TESSALON) 200 MG capsule Take 1 capsule (200 mg total) by mouth 2 (two) times daily as needed for cough. (Patient not taking: Reported on 01/17/2023) 20 capsule 0   No facility-administered medications prior to visit.    Allergies  Allergen Reactions   Concerta [Methylphenidate] Other (See Comments)    hallucinations   Terbinafine Other (See Comments)   Trazodone And Nefazodone Other (See Comments)    Pt stated she has nightmares   Clindamycin Hcl Rash  Haldol [Haloperidol] Anxiety    Unable to seat still   Terbinafine Hcl Nausea And Vomiting    Review of Systems  Constitutional:  Negative for fever and malaise/fatigue.  HENT:  Negative for congestion, sinus pain and sore throat.   Eyes:  Negative for blurred vision.  Respiratory:  Positive for cough. Negative for shortness of breath and wheezing.   Cardiovascular:  Negative for chest pain, palpitations and leg swelling.  Gastrointestinal:  Negative for abdominal pain, constipation, diarrhea, nausea and vomiting.  Genitourinary:  Negative for dysuria, frequency and hematuria.  Musculoskeletal:  Negative for back pain.  Skin:  Negative for rash.  Neurological:  Negative for loss of consciousness and headaches.       Objective:    Physical Exam Vitals and nursing note reviewed.  Constitutional:      General: She is not in acute distress.    Appearance: Normal appearance. She is not ill-appearing.  HENT:     Head: Normocephalic and atraumatic.     Right Ear: External ear normal.     Left Ear: External ear normal.  Eyes:     Extraocular Movements: Extraocular movements intact.     Pupils: Pupils are equal, round, and reactive to light.  Cardiovascular:     Rate and Rhythm: Normal rate and regular rhythm.     Heart sounds: Normal heart sounds. No murmur heard.    No gallop.  Pulmonary:     Effort: Pulmonary effort is normal. No  respiratory distress.     Breath sounds: Normal breath sounds. No decreased breath sounds, wheezing, rhonchi or rales.  Skin:    General: Skin is warm and dry.  Neurological:     General: No focal deficit present.     Mental Status: She is alert and oriented to person, place, and time.  Psychiatric:        Judgment: Judgment normal.     BP 104/75 (BP Location: Right Arm, Patient Position: Sitting, Cuff Size: Large)   Pulse 72   Temp 98.3 F (36.8 C) (Oral)   Resp 18   Ht 5\' 4"  (1.626 m)   Wt 234 lb 3.2 oz (106.2 kg)   SpO2 99%   BMI 40.20 kg/m  Wt Readings from Last 3 Encounters:  01/17/23 234 lb 3.2 oz (106.2 kg)  01/10/23 233 lb 6.4 oz (105.9 kg)  12/13/22 229 lb 3.2 oz (104 kg)       Assessment & Plan:  Bronchitis Assessment & Plan: Check cxr today Pred taper  Qvar and albuterol  F/u as needed   Orders: -     DG Chest 2 View; Future -     predniSONE; TAKE 3 TABLETS BY MOUTH DAILY FOR 3 DAYS, THEN TAKE 2 TABLETS DAILY FOR 3 DAYS, THEN TAKE 1 TABLET DAILY FOR 3 DAYS, AND THEN TAKE 1/2 TABLET DAILY FOR 3 DAYS.  Dispense: 20 tablet; Refill: 0 -     Qvar RediHaler; Inhale 2 puffs into the lungs 2 (two) times daily.  Dispense: 10.6 g; Refill: 2 -     Albuterol Sulfate HFA; Inhale 2 puffs into the lungs every 6 (six) hours as needed for wheezing or shortness of breath.  Dispense: 18 g; Refill: 0 -     Benzonatate; Take 1 capsule (200 mg total) by mouth 2 (two) times daily as needed for cough.  Dispense: 20 capsule; Refill: 0     I,Alexis Herring,acting as a scribe for [employer], DO.,have documented all relevant  documentation on the behalf of Jaclyn Schultz, DO,as directed by  Jaclyn Schultz, DO while in the presence of Jaclyn Schultz, DO.   I, Jaclyn Schultz, DO, personally preformed the services described in this documentation.  All medical record entries made by the scribe were at my direction and in my presence.  I have reviewed the  chart and discharge instructions (if applicable) and agree that the record reflects my personal performance and is accurate and complete. 01/17/23   Jaclyn Schultz, DO

## 2023-01-18 ENCOUNTER — Encounter: Payer: Self-pay | Admitting: *Deleted

## 2023-01-18 ENCOUNTER — Other Ambulatory Visit (HOSPITAL_COMMUNITY): Payer: Self-pay

## 2023-01-21 ENCOUNTER — Other Ambulatory Visit: Payer: Self-pay | Admitting: Cardiology

## 2023-01-21 ENCOUNTER — Other Ambulatory Visit (HOSPITAL_COMMUNITY): Payer: Self-pay

## 2023-01-21 MED ORDER — PROPRANOLOL HCL 10 MG PO TABS
5.0000 mg | ORAL_TABLET | Freq: Every day | ORAL | 0 refills | Status: DC
  Filled 2023-01-21 – 2023-02-08 (×3): qty 45, 90d supply, fill #0

## 2023-01-22 ENCOUNTER — Other Ambulatory Visit (HOSPITAL_COMMUNITY): Payer: Self-pay

## 2023-01-22 MED ORDER — PALIPERIDONE ER 1.5 MG PO TB24
1.5000 mg | ORAL_TABLET | Freq: Every evening | ORAL | 5 refills | Status: DC
  Filled 2023-01-22: qty 30, 30d supply, fill #0
  Filled 2023-02-22: qty 30, 30d supply, fill #1
  Filled 2023-03-25: qty 30, 30d supply, fill #2
  Filled 2023-04-22: qty 30, 30d supply, fill #3
  Filled 2023-05-07 – 2023-05-15 (×2): qty 30, 30d supply, fill #4
  Filled 2023-06-22: qty 30, 30d supply, fill #5

## 2023-01-22 MED ORDER — INVEGA HAFYERA 1560 MG/5ML IM SUSY
1560.0000 mg | PREFILLED_SYRINGE | INTRAMUSCULAR | 0 refills | Status: AC
  Filled 2023-01-22 (×3): qty 5, 180d supply, fill #0
  Filled 2023-01-25 – 2023-03-06 (×2): qty 5, 90d supply, fill #0

## 2023-01-23 ENCOUNTER — Other Ambulatory Visit (HOSPITAL_COMMUNITY): Payer: Self-pay

## 2023-01-24 ENCOUNTER — Other Ambulatory Visit (HOSPITAL_COMMUNITY): Payer: Self-pay

## 2023-01-25 ENCOUNTER — Other Ambulatory Visit (HOSPITAL_COMMUNITY): Payer: Self-pay

## 2023-01-25 ENCOUNTER — Other Ambulatory Visit: Payer: Self-pay

## 2023-01-29 ENCOUNTER — Encounter: Payer: Self-pay | Admitting: Pharmacist

## 2023-01-29 ENCOUNTER — Other Ambulatory Visit (HOSPITAL_COMMUNITY): Payer: Self-pay

## 2023-01-29 ENCOUNTER — Other Ambulatory Visit: Payer: Self-pay

## 2023-01-30 ENCOUNTER — Other Ambulatory Visit: Payer: Self-pay

## 2023-02-04 ENCOUNTER — Other Ambulatory Visit: Payer: Self-pay

## 2023-02-07 ENCOUNTER — Other Ambulatory Visit: Payer: Self-pay | Admitting: Family Medicine

## 2023-02-07 ENCOUNTER — Other Ambulatory Visit (HOSPITAL_COMMUNITY): Payer: Self-pay

## 2023-02-07 DIAGNOSIS — J302 Other seasonal allergic rhinitis: Secondary | ICD-10-CM

## 2023-02-07 MED ORDER — FLUTICASONE PROPIONATE 50 MCG/ACT NA SUSP
2.0000 | Freq: Every day | NASAL | 1 refills | Status: DC
Start: 2023-02-07 — End: 2023-05-18
  Filled 2023-02-07: qty 16, 30d supply, fill #0
  Filled 2023-03-14 – 2023-05-15 (×2): qty 16, 30d supply, fill #1

## 2023-02-08 ENCOUNTER — Other Ambulatory Visit (HOSPITAL_COMMUNITY): Payer: Self-pay

## 2023-02-12 ENCOUNTER — Encounter: Payer: Self-pay | Admitting: *Deleted

## 2023-02-21 ENCOUNTER — Other Ambulatory Visit: Payer: Self-pay

## 2023-02-22 ENCOUNTER — Other Ambulatory Visit (HOSPITAL_COMMUNITY): Payer: Self-pay

## 2023-02-22 ENCOUNTER — Other Ambulatory Visit: Payer: Self-pay

## 2023-02-22 MED ORDER — QUETIAPINE FUMARATE 25 MG PO TABS
25.0000 mg | ORAL_TABLET | Freq: Every day | ORAL | 3 refills | Status: DC
  Filled 2023-02-22: qty 90, 90d supply, fill #0
  Filled 2023-05-07 – 2023-05-09 (×3): qty 90, 90d supply, fill #1

## 2023-02-23 ENCOUNTER — Other Ambulatory Visit (HOSPITAL_COMMUNITY): Payer: Self-pay

## 2023-02-25 ENCOUNTER — Other Ambulatory Visit (HOSPITAL_COMMUNITY): Payer: Self-pay

## 2023-03-06 ENCOUNTER — Other Ambulatory Visit: Payer: Self-pay

## 2023-03-06 ENCOUNTER — Other Ambulatory Visit (HOSPITAL_COMMUNITY): Payer: Self-pay

## 2023-03-14 ENCOUNTER — Other Ambulatory Visit (HOSPITAL_COMMUNITY): Payer: Self-pay

## 2023-03-22 ENCOUNTER — Other Ambulatory Visit (HOSPITAL_COMMUNITY): Payer: Self-pay

## 2023-03-25 ENCOUNTER — Other Ambulatory Visit (HOSPITAL_COMMUNITY): Payer: Self-pay

## 2023-03-28 ENCOUNTER — Other Ambulatory Visit (HOSPITAL_COMMUNITY): Payer: Self-pay

## 2023-04-08 ENCOUNTER — Other Ambulatory Visit (HOSPITAL_COMMUNITY): Payer: Self-pay

## 2023-04-08 ENCOUNTER — Institutional Professional Consult (permissible substitution): Payer: No Typology Code available for payment source | Admitting: Internal Medicine

## 2023-04-08 MED ORDER — LORAZEPAM 1 MG PO TABS
1.0000 mg | ORAL_TABLET | Freq: Two times a day (BID) | ORAL | 2 refills | Status: DC
  Filled 2023-04-08: qty 60, 30d supply, fill #0
  Filled 2023-05-07 – 2023-05-09 (×3): qty 60, 30d supply, fill #1
  Filled 2023-06-05: qty 60, 30d supply, fill #2

## 2023-04-22 ENCOUNTER — Other Ambulatory Visit (HOSPITAL_COMMUNITY): Payer: Self-pay

## 2023-04-22 ENCOUNTER — Other Ambulatory Visit: Payer: Self-pay | Admitting: Family Medicine

## 2023-04-22 ENCOUNTER — Other Ambulatory Visit: Payer: Self-pay | Admitting: Cardiology

## 2023-04-22 DIAGNOSIS — J40 Bronchitis, not specified as acute or chronic: Secondary | ICD-10-CM

## 2023-04-22 MED ORDER — PROPRANOLOL HCL 10 MG PO TABS
5.0000 mg | ORAL_TABLET | Freq: Every day | ORAL | 0 refills | Status: DC
  Filled 2023-04-22: qty 45, 90d supply, fill #0

## 2023-04-22 MED ORDER — QVAR REDIHALER 40 MCG/ACT IN AERB
2.0000 | INHALATION_SPRAY | Freq: Two times a day (BID) | RESPIRATORY_TRACT | 2 refills | Status: DC
  Filled 2023-04-22: qty 10.6, 30d supply, fill #0
  Filled 2023-05-27: qty 10.6, 30d supply, fill #1
  Filled 2023-06-22: qty 10.6, 30d supply, fill #2

## 2023-04-23 ENCOUNTER — Other Ambulatory Visit (HOSPITAL_COMMUNITY): Payer: Self-pay

## 2023-04-24 ENCOUNTER — Other Ambulatory Visit: Payer: Self-pay

## 2023-04-24 ENCOUNTER — Other Ambulatory Visit (HOSPITAL_COMMUNITY): Payer: Self-pay

## 2023-05-07 ENCOUNTER — Other Ambulatory Visit (HOSPITAL_COMMUNITY): Payer: Self-pay

## 2023-05-09 ENCOUNTER — Other Ambulatory Visit (HOSPITAL_COMMUNITY): Payer: Self-pay

## 2023-05-09 ENCOUNTER — Other Ambulatory Visit: Payer: Self-pay

## 2023-05-14 ENCOUNTER — Other Ambulatory Visit (HOSPITAL_COMMUNITY): Payer: Self-pay

## 2023-05-14 MED ORDER — QUETIAPINE FUMARATE 100 MG PO TABS
100.0000 mg | ORAL_TABLET | Freq: Every day | ORAL | 3 refills | Status: AC
  Filled 2023-05-14: qty 270, 90d supply, fill #0
  Filled 2023-09-24: qty 270, 90d supply, fill #1

## 2023-05-15 ENCOUNTER — Other Ambulatory Visit: Payer: Self-pay

## 2023-05-15 ENCOUNTER — Other Ambulatory Visit (HOSPITAL_COMMUNITY): Payer: Self-pay

## 2023-05-15 MED ORDER — BUPROPION HCL ER (XL) 150 MG PO TB24
150.0000 mg | ORAL_TABLET | Freq: Every morning | ORAL | 0 refills | Status: DC
  Filled 2023-05-15: qty 90, 90d supply, fill #0

## 2023-05-16 ENCOUNTER — Other Ambulatory Visit: Payer: Self-pay

## 2023-05-18 ENCOUNTER — Other Ambulatory Visit (HOSPITAL_COMMUNITY): Payer: Self-pay

## 2023-05-18 ENCOUNTER — Other Ambulatory Visit: Payer: Self-pay | Admitting: Family Medicine

## 2023-05-18 DIAGNOSIS — J302 Other seasonal allergic rhinitis: Secondary | ICD-10-CM

## 2023-05-20 ENCOUNTER — Other Ambulatory Visit (HOSPITAL_COMMUNITY): Payer: Self-pay

## 2023-05-20 MED ORDER — FLUTICASONE PROPIONATE 50 MCG/ACT NA SUSP
2.0000 | Freq: Every day | NASAL | 5 refills | Status: DC
  Filled 2023-05-20 – 2023-07-23 (×2): qty 16, 30d supply, fill #0
  Filled 2023-08-13: qty 16, 30d supply, fill #1

## 2023-05-22 ENCOUNTER — Other Ambulatory Visit (HOSPITAL_COMMUNITY)
Admission: RE | Admit: 2023-05-22 | Discharge: 2023-05-22 | Disposition: A | Discharge status: Home / Self Care | Payer: No Typology Code available for payment source | Source: Ambulatory Visit | Attending: Obstetrics & Gynecology | Admitting: Obstetrics & Gynecology

## 2023-05-22 ENCOUNTER — Ambulatory Visit (INDEPENDENT_AMBULATORY_CARE_PROVIDER_SITE_OTHER): Payer: No Typology Code available for payment source | Admitting: Medical

## 2023-05-22 ENCOUNTER — Encounter: Payer: Self-pay | Admitting: Medical

## 2023-05-22 VITALS — BP 109/84 | HR 100 | Wt 253.0 lb

## 2023-05-22 DIAGNOSIS — Z113 Encounter for screening for infections with a predominantly sexual mode of transmission: Secondary | ICD-10-CM | POA: Diagnosis not present

## 2023-05-22 DIAGNOSIS — B9689 Other specified bacterial agents as the cause of diseases classified elsewhere: Secondary | ICD-10-CM

## 2023-05-22 DIAGNOSIS — N898 Other specified noninflammatory disorders of vagina: Secondary | ICD-10-CM | POA: Diagnosis present

## 2023-05-22 DIAGNOSIS — N760 Acute vaginitis: Secondary | ICD-10-CM

## 2023-05-22 DIAGNOSIS — Z114 Encounter for screening for human immunodeficiency virus [HIV]: Secondary | ICD-10-CM | POA: Diagnosis not present

## 2023-05-22 NOTE — Progress Notes (Signed)
  History:  [name] is a 27 y.o. No obstetric history on file. who presents to clinic today for vaginal discharge. The patient states vaginal discharge with odor since 8/11. She has noted a clear discharge mostly that has increased recently. She states odor is at times metallic and other times fishy. She has been sexually active most recently on 9/18 with a condom. She tried monistat 7 that was completed on 10/5. She states odor has improved, but still continues to note a clear discharge.    The following portions of the patient's history were reviewed and updated as appropriate: allergies, current medications, family history, past medical history, social history, past surgical history and problem list.  Review of Systems:  Review of Systems  Constitutional:  Negative for fever.  Gastrointestinal:  Negative for abdominal pain.  Genitourinary:        + vaginal discharge Neg - vaginal bleeding, pelvic pain      Objective:  Physical Exam BP 109/84   Pulse 100   Wt 253 lb (114.8 kg)   BMI 43.43 kg/m  Physical Exam Exam conducted with a chaperone present.  Constitutional:      Appearance: Normal appearance. She is obese. She is not ill-appearing.  Cardiovascular:     Rate and Rhythm: Normal rate.  Pulmonary:     Effort: Pulmonary effort is normal.  Abdominal:     General: Abdomen is flat. There is no distension.     Palpations: Abdomen is soft.  Genitourinary:    General: Normal vulva.     Vagina: No signs of injury. Vaginal discharge (small, thin, white, slight odor) present. No bleeding.     Cervix: Normal.  Skin:    General: Skin is warm and dry.     Findings: No erythema.  Neurological:     Mental Status: She is alert and oriented to person, place, and time.     Health Maintenance Due  Topic Date Due   Cervical Cancer Screening (Pap smear)  11/03/2019   INFLUENZA VACCINE  03/14/2023   COVID-19 Vaccine (4 - 2023-24 season) 04/14/2023   Medicare Annual  Wellness (AWV)  07/12/2023    Labs, imaging and previous visits in Epic reviewed  Assessment & Plan:  1. Vaginal discharge - Cervicovaginal ancillary only - Discussed likelihood of BV given symptoms and course  - Discussed hygiene products and probiotics for avoiding recurrence of BV  2. Screen for STD (sexually transmitted disease) - Hepatitis B surface antigen - Hepatitis C antibody - HIV Antibody (routine testing w rflx) - RPR  3. Encounter for screening for human immunodeficiency virus (HIV) - HIV Antibody (routine testing w rflx)  Approximately 25 minutes of total time was spent with this patient on chart review, history taking, patient education, physical exam, coordination of care and documentation.   Return if symptoms worsen or fail to improve.  Marny Lowenstein, PA-C 05/22/2023 3:21 PM

## 2023-05-23 ENCOUNTER — Other Ambulatory Visit (HOSPITAL_COMMUNITY): Payer: Self-pay

## 2023-05-23 LAB — HEPATITIS C ANTIBODY: Hep C Virus Ab: NONREACTIVE

## 2023-05-23 LAB — HIV ANTIBODY (ROUTINE TESTING W REFLEX): HIV Screen 4th Generation wRfx: NONREACTIVE

## 2023-05-23 LAB — RPR: RPR Ser Ql: NONREACTIVE

## 2023-05-23 LAB — HEPATITIS B SURFACE ANTIGEN: Hepatitis B Surface Ag: NEGATIVE

## 2023-05-24 LAB — CERVICOVAGINAL ANCILLARY ONLY
Bacterial Vaginitis (gardnerella): NEGATIVE
Candida Glabrata: NEGATIVE
Candida Vaginitis: NEGATIVE
Chlamydia: NEGATIVE
Comment: NEGATIVE
Comment: NEGATIVE
Comment: NEGATIVE
Comment: NEGATIVE
Comment: NEGATIVE
Comment: NORMAL
Neisseria Gonorrhea: NEGATIVE
Trichomonas: NEGATIVE

## 2023-05-27 ENCOUNTER — Other Ambulatory Visit (HOSPITAL_COMMUNITY): Payer: Self-pay

## 2023-05-27 MED ORDER — METRONIDAZOLE 500 MG PO TABS
500.0000 mg | ORAL_TABLET | Freq: Two times a day (BID) | ORAL | 0 refills | Status: DC
Start: 2023-05-27 — End: 2023-07-18
  Filled 2023-05-27: qty 14, 7d supply, fill #0

## 2023-05-27 NOTE — Addendum Note (Signed)
Addended by: Marny Lowenstein on: 05/27/2023 09:12 AM   Modules accepted: Orders

## 2023-05-28 ENCOUNTER — Other Ambulatory Visit (HOSPITAL_COMMUNITY): Payer: Self-pay

## 2023-05-30 ENCOUNTER — Ambulatory Visit (INDEPENDENT_AMBULATORY_CARE_PROVIDER_SITE_OTHER): Payer: No Typology Code available for payment source | Admitting: Family Medicine

## 2023-05-30 ENCOUNTER — Other Ambulatory Visit (HOSPITAL_COMMUNITY): Payer: Self-pay

## 2023-05-30 ENCOUNTER — Encounter: Payer: Self-pay | Admitting: Family Medicine

## 2023-05-30 ENCOUNTER — Other Ambulatory Visit (HOSPITAL_BASED_OUTPATIENT_CLINIC_OR_DEPARTMENT_OTHER): Payer: Self-pay

## 2023-05-30 VITALS — BP 101/76 | HR 108 | Temp 98.2°F | Ht 64.0 in | Wt 256.0 lb

## 2023-05-30 DIAGNOSIS — H6593 Unspecified nonsuppurative otitis media, bilateral: Secondary | ICD-10-CM

## 2023-05-30 DIAGNOSIS — R053 Chronic cough: Secondary | ICD-10-CM

## 2023-05-30 MED ORDER — MONTELUKAST SODIUM 10 MG PO TABS
10.0000 mg | ORAL_TABLET | Freq: Every day | ORAL | 3 refills | Status: DC
Start: 2023-05-30 — End: 2023-09-03
  Filled 2023-05-30: qty 30, 30d supply, fill #0
  Filled 2023-07-06 (×2): qty 30, 30d supply, fill #1
  Filled 2023-08-09: qty 30, 30d supply, fill #2

## 2023-05-30 MED ORDER — AMOXICILLIN-POT CLAVULANATE 875-125 MG PO TABS
1.0000 | ORAL_TABLET | Freq: Two times a day (BID) | ORAL | 0 refills | Status: DC
Start: 2023-05-30 — End: 2023-09-03
  Filled 2023-05-30: qty 20, 10d supply, fill #0

## 2023-05-30 MED ORDER — AZELASTINE HCL 0.1 % NA SOLN
2.0000 | Freq: Every day | NASAL | 12 refills | Status: AC | PRN
  Filled 2023-05-30: qty 30, 90d supply, fill #0
  Filled 2023-05-30: qty 30, 30d supply, fill #0
  Filled 2023-07-23 – 2023-08-13 (×3): qty 30, 90d supply, fill #1

## 2023-05-30 MED ORDER — BENZONATATE 200 MG PO CAPS
200.0000 mg | ORAL_CAPSULE | Freq: Two times a day (BID) | ORAL | 0 refills | Status: AC | PRN
  Filled 2023-05-30: qty 20, 10d supply, fill #0

## 2023-05-30 NOTE — Progress Notes (Signed)
Acute Office Visit  Subjective:     Patient ID: [name], female    DOB: [birth year], 27 y.o.   MRN: [id number]  Chief Complaint  Patient presents with   Nausea   Nasal Congestion    HPI Patient is in today for nasal congestion, cough.   Discussed the use of AI scribe software for clinical note transcription with the patient, who gave verbal consent to proceed.  History of Present Illness   The patient, accompanied by her mother, presents with a persistent cough, intermittent dizziness, and shortness of breath. The cough, which has been present since a COVID-19 infection in February, is described as both dry and wet, with occasional production of sputum. The patient has been on Qvar for bronchitis since June, but reports no significant improvement. The cough is particularly severe at night and is associated with bouts of shortness of breath.  The patient also reports intermittent dizziness, which has been present since the onset of the cough. The dizziness is exacerbated by changes in position and rapid head movements. The patient denies any associated headaches but reports occasional pressure in the forehead.  The patient has a history of allergies, with symptoms of sneezing and nasal congestion. She is currently using fluticasone nasal spray and levocetirizine. The patient also reports occasional heartburn, particularly after lying down post meals.  The patient has been on Seroquel and other unspecified medications, which have contributed to significant weight gain. The patient's mother plans to purchase a treadmill to encourage more physical activity.  The patient has a family history of asthma but has not been tested for the condition. She reports shortness of breath with activity and coughing mostly at night, which could suggest underlying asthma.   The patient has been on various medications for the cough, including prednisone, albuterol, and Tessalon pearls, with  variable relief. The patient's mother reports that the patient's cough is so severe that it has prompted considerations of hospital visits. Mom reports they are due for regular cardiology visit (tachycardia) and will be scheduling that soon. Denies chest pian, fevers, body aches.           ROS All review of systems negative except what is listed in the HPI      Objective:    BP 101/76   Pulse (!) 108   Temp 98.2 F (36.8 C) (Oral)   Ht 5\' 4"  (1.626 m)   Wt 256 lb (116.1 kg)   SpO2 96%   BMI 43.94 kg/m    Physical Exam Vitals reviewed.  Constitutional:      Appearance: Normal appearance.  HENT:     Head: Normocephalic and atraumatic.     Right Ear: A middle ear effusion is present.     Left Ear: A middle ear effusion is present.     Nose: Congestion present.  Cardiovascular:     Rate and Rhythm: Normal rate and regular rhythm.     Heart sounds: Normal heart sounds.  Pulmonary:     Effort: Pulmonary effort is normal.     Breath sounds: Normal breath sounds. No wheezing, rhonchi or rales.  Musculoskeletal:     Cervical back: Normal range of motion and neck supple.  Skin:    General: Skin is warm and dry.  Neurological:     Mental Status: She is alert and oriented to person, place, and time.  Psychiatric:        Mood and Affect: Mood normal.  Behavior: Behavior normal.        Thought Content: Thought content normal.        Judgment: Judgment normal.     No results found for any visits on 05/30/23.      Assessment & Plan:   Problem List Items Addressed This Visit       Active Problems   Chronic cough - Primary   Relevant Medications   montelukast (SINGULAIR) 10 MG tablet   azelastine (ASTELIN) 0.1 % nasal spray   benzonatate (TESSALON) 200 MG capsule   amoxicillin-clavulanate (AUGMENTIN) 875-125 MG tablet   Other Relevant Orders   Ambulatory referral to Pulmonology   Other Visit Diagnoses     Fluid level behind tympanic membrane of both  ears       Relevant Medications   azelastine (ASTELIN) 0.1 % nasal spray      We believe you have a chronic/cyclical cough that is aggravated by reflux, coughing, and drainage. We will also refer to pulmonology for lung function testing.   Goal is to not cough or clear your throat.  Avoid coughing or clearing throat by using non-mint. products/sugarless candy, water, ice chips. Remember NO MINT PRODUCTS Mucinex DM 1-2 every 12 hrs or Delsym 2 tsp every 12 hrs for cough Tessalon Three times a day as needed for cough.  Prilosec 20mg  30 min before breakfast or dinner.  Zyrtec 10mg  at bedtime Restart your Astelin nose spray. Continue Flonase. Add Singulair.  Please contact office for follow-up if symptoms do not improve or worsen. Seek emergency care if symptoms become severe. Given duration of symptoms, will go ahead and try a course of Amoxicillin for sinus component.  Follow-up with your cardiologist as we discussed  Meds ordered this encounter  Medications   montelukast (SINGULAIR) 10 MG tablet    Sig: Take 1 tablet (10 mg total) by mouth at bedtime.    Dispense:  30 tablet    Refill:  3    Order Specific Question:   Supervising Provider    Answer:   Danise Edge A [[id number]]   azelastine (ASTELIN) 0.1 % nasal spray    Sig: Place 2 sprays into both nostrils daily as needed for rhinitis or allergies as directed    Dispense:  30 mL    Refill:  12    Order Specific Question:   Supervising Provider    Answer:   Danise Edge A [[id number]]   benzonatate (TESSALON) 200 MG capsule    Sig: Take 1 capsule (200 mg total) by mouth 2 (two) times daily as needed for cough.    Dispense:  20 capsule    Refill:  0    Order Specific Question:   Supervising Provider    Answer:   Danise Edge A [[id number]]   amoxicillin-clavulanate (AUGMENTIN) 875-125 MG tablet    Sig: Take 1 tablet by mouth 2 (two) times daily.    Dispense:  20 tablet    Refill:  0    Order Specific Question:   Supervising Provider     Answer:   Danise Edge A [[id number]]    Return if symptoms worsen or fail to improve.  Clayborne Dana, NP

## 2023-05-30 NOTE — Patient Instructions (Addendum)
We believe you have a chronic/cyclical cough that is aggravated by reflux, coughing, and drainage. We will also refer to pulmonology for lung function testing.   Goal is to not cough or clear your throat.  Avoid coughing or clearing throat by using non-mint. products/sugarless candy, water, ice chips. Remember NO MINT PRODUCTS Mucinex DM 1-2 every 12 hrs or Delsym 2 tsp every 12 hrs for cough Tessalon Three times a day as needed for cough.  Prilosec 20mg  30 min before breakfast or dinner.  Zyrtec 10mg  at bedtime Restart your Astelin nose spray. Continue Flonase. Add Singulair.  Please contact office for follow-up if symptoms do not improve or worsen. Seek emergency care if symptoms become severe. Given duration of symptoms, will go ahead and try a course of Amoxicillin for sinus component.

## 2023-05-31 ENCOUNTER — Other Ambulatory Visit (HOSPITAL_COMMUNITY): Payer: Self-pay

## 2023-06-05 ENCOUNTER — Other Ambulatory Visit (HOSPITAL_COMMUNITY): Payer: Self-pay

## 2023-06-06 ENCOUNTER — Other Ambulatory Visit (HOSPITAL_COMMUNITY): Payer: Self-pay

## 2023-06-22 ENCOUNTER — Other Ambulatory Visit (HOSPITAL_COMMUNITY): Payer: Self-pay

## 2023-06-24 ENCOUNTER — Other Ambulatory Visit (HOSPITAL_COMMUNITY): Payer: Self-pay

## 2023-06-25 ENCOUNTER — Other Ambulatory Visit (HOSPITAL_COMMUNITY): Payer: Self-pay

## 2023-07-04 ENCOUNTER — Telehealth: Payer: Self-pay | Admitting: Family Medicine

## 2023-07-04 NOTE — Telephone Encounter (Signed)
Copied from CRM [id number]. Topic: Medicare AWV >> Jul 04, 2023  9:57 AM Payton Doughty wrote: Reason for CRM: Called LVM 07/04/2023 to schedule Annual Wellness Visit  Verlee Rossetti; Care Guide Ambulatory Clinical Support Gustine l [facility] Direct Dial: [phone]

## 2023-07-06 ENCOUNTER — Other Ambulatory Visit (HOSPITAL_COMMUNITY): Payer: Self-pay

## 2023-07-08 ENCOUNTER — Other Ambulatory Visit (HOSPITAL_COMMUNITY): Payer: Self-pay

## 2023-07-08 ENCOUNTER — Ambulatory Visit: Payer: PRIVATE HEALTH INSURANCE | Admitting: Family Medicine

## 2023-07-08 MED ORDER — LORAZEPAM 1 MG PO TABS
1.0000 mg | ORAL_TABLET | Freq: Two times a day (BID) | ORAL | 0 refills | Status: DC
  Filled 2023-07-08: qty 60, 30d supply, fill #0
  Filled 2023-08-03: qty 60, 30d supply, fill #1
  Filled 2023-09-09: qty 60, 30d supply, fill #2

## 2023-07-09 ENCOUNTER — Other Ambulatory Visit (HOSPITAL_COMMUNITY): Payer: Self-pay

## 2023-07-13 ENCOUNTER — Other Ambulatory Visit (HOSPITAL_COMMUNITY): Payer: Self-pay

## 2023-07-18 ENCOUNTER — Encounter: Payer: Self-pay | Admitting: Family Medicine

## 2023-07-18 ENCOUNTER — Ambulatory Visit: Payer: No Typology Code available for payment source | Admitting: Family Medicine

## 2023-07-18 VITALS — BP 119/76 | HR 102 | Temp 98.8°F | Resp 16 | Ht 64.0 in | Wt 254.0 lb

## 2023-07-18 DIAGNOSIS — H6523 Chronic serous otitis media, bilateral: Secondary | ICD-10-CM

## 2023-07-18 NOTE — Progress Notes (Signed)
Established Patient Office Visit  Subjective   Patient ID: [name], female    DOB: 01/17/1996  Age: 27 y.o. MRN: [id number]  Chief Complaint  Patient presents with   Ear Fullness    Patient complains of bilateral ear fullness    HPI Discussed the use of AI scribe software for clinical note transcription with the patient, who gave verbal consent to proceed.  History of Present Illness   The patient presents with a primary complaint of clogged ears, which she describes as a feeling of fullness. She denies any associated pain or discomfort. The patient reports a history of inserting the back of her eyeglasses into her ears, but it is unclear if this is related to the current issue. She denies any recent upper respiratory symptoms such as stuffiness.  The patient has been using azelastine and a nasal spray for an unspecified condition. She also takes cetirizine, an antihistamine, which was recommended as a switch from levocetirizine. Despite these treatments, the patient's ear fullness persists.  The patient also has a history of coughing, which has improved significantly recently. She has been prescribed montelukast, which she believes has contributed to the reduction in her coughing. She has an upcoming appointment with a pulmonologist.  The patient has recently adopted a vegan diet, which she believes has contributed to her improved health. She reports that she has stopped coughing since starting this diet.  The patient also mentions that she has been prescribed propranolol at a low dose, which she believes has helped with her mood. She reports a history of taking antibiotics, which she believes makes her feel like a "normal person," but she has been advised against long-term use of antibiotics due to the risk of resistance.  The patient's mother, who appears to be involved in her care, expresses concern about the patient's ear fullness and the potential impact on her hearing. She  also mentions that the patient has been on propranolol for over a year, which has helped with her mood and anxiety.      Patient Active Problem List   Diagnosis Date Noted   Bilateral chronic serous otitis media 07/18/2023   Chronic cough 05/30/2023   Abdominal pressure 01/10/2023   Abnormal urine 01/10/2023   Vaginal discharge 11/24/2021   Gastroenteritis 06/13/2021   Bronchitis 04/18/2021   Seizure disorder (HCC) 10/25/2020   Thrush 10/25/2020   Amenorrhea 10/06/2020   Ear pain, left 10/06/2020   Schizo-affective schizophrenia (HCC)    Allergy    ADHD (attention deficit hyperactivity disorder)    Depression    Bipolar I disorder (HCC) 05/12/2020   Benzodiazepine dependence (HCC) 05/12/2020   Morbid obesity (HCC) 09/06/2019   Possible pregnancy 09/06/2019   Delirium tremens (HCC) 05/20/2019   Seizure (HCC) 05/20/2019   Acute vaginitis 05/20/2019   High risk sexual behavior 05/20/2019   Vitamin D deficiency 03/10/2019   Constipation 04/03/2018   Schizoaffective disorder (HCC) 03/08/2018   Bipolar disorder, current episode manic severe with psychotic features (HCC)    MDD (major depressive disorder), recurrent severe, without psychosis (HCC) 02/11/2018   Pelvic pain 01/31/2018   Genital herpes simplex 10/24/2017   Anxiety 08/30/2017   Severe bipolar I disorder, current or most recent episode depressed (HCC) 03/30/2017   Syncope 03/03/2017   STD exposure 05/13/2015   Eczema 12/11/2013   Encounter for contraceptive management 03/16/2013   Bipolar disorder (HCC) 12/30/2012   ADD (attention deficit disorder) 04/24/2011   Past Medical History:  Diagnosis Date  Acute vaginitis 05/20/2019   ADD (attention deficit disorder) 04/24/2011   ADHD (attention deficit hyperactivity disorder)    Allergy    Anxiety    Benzodiazepine dependence (HCC) 05/12/2020   Bipolar disorder (HCC)    Bipolar disorder, current episode manic severe with psychotic features (HCC)    Bipolar I  disorder (HCC) 05/12/2020   Constipation 04/03/2018   Delirium tremens (HCC) 05/20/2019   Depression    Eczema 12/11/13   Genital herpes simplex 10/24/2017   High risk heterosexual behavior 05/20/2019   MDD (major depressive disorder), recurrent severe, without psychosis (HCC) 02/11/2018   Morbid obesity (HCC) 09/06/2019   Pelvic pain 01/31/2018   Possible pregnancy 09/06/2019   Preventative health care 03/16/2013   Schizo-affective schizophrenia (HCC)    Schizoaffective disorder (HCC) 03/08/2018   Seizure (HCC) 05/20/2019   Severe bipolar I disorder, current or most recent episode depressed (HCC) 03/30/2017   STD exposure 05/13/2015   Syncope 03/03/2017   Vitamin D deficiency 03/10/2019   No past surgical history on file. Social History   Tobacco Use   Smoking status: Never   Smokeless tobacco: Never  Vaping Use   Vaping status: Former   Quit date: 09/01/2017  Substance Use Topics   Alcohol use: Not Currently    Comment: occasionally   Drug use: Not Currently   Social History   Socioeconomic History   Marital status: Single    Spouse name: Not on file   Number of children: 0   Years of education: Not on file   Highest education level: Associate degree: academic program  Occupational History   Occupation: Unemployed  Tobacco Use   Smoking status: Never   Smokeless tobacco: Never  Vaping Use   Vaping status: Former   Quit date: 09/01/2017  Substance and Sexual Activity   Alcohol use: Not Currently    Comment: occasionally   Drug use: Not Currently   Sexual activity: Not Currently    Birth control/protection: Pill  Other Topics Concern   Not on file  Social History Narrative   Lives with mom in an apartment on the second floor.  No children.  Currently not working.  Education: college. Left handed   Drinks no caffiene   Lives with mom, sista and niece   Social Determinants of Health   Financial Resource Strain: Low Risk  (07/11/2022)   Overall Financial Resource Strain  (CARDIA)    Difficulty of Paying Living Expenses: Not hard at all  Food Insecurity: No Food Insecurity (07/11/2022)   Hunger Vital Sign    Worried About Running Out of Food in the Last Year: Never true    Ran Out of Food in the Last Year: Never true  Transportation Needs: No Transportation Needs (07/11/2022)   PRAPARE - Administrator, Civil Service (Medical): No    Lack of Transportation (Non-Medical): No  Physical Activity: Inactive (07/11/2022)   Exercise Vital Sign    Days of Exercise per Week: 0 days    Minutes of Exercise per Session: 0 min  Stress: No Stress Concern Present (07/11/2022)   [employer] of Occupational Health - Occupational Stress Questionnaire    Feeling of Stress : Only a little  Social Connections: Moderately Isolated (07/11/2022)   Social Connection and Isolation Panel [NHANES]    Frequency of Communication with Friends and Family: More than three times a week    Frequency of Social Gatherings with Friends and Family: More than three times a week  Attends Religious Services: 1 to 4 times per year    Active Member of Clubs or Organizations: No    Attends Banker Meetings: Never    Marital Status: Never married  Intimate Partner Violence: Not At Risk (07/11/2022)   Humiliation, Afraid, Rape, and Kick questionnaire    Fear of Current or Ex-Partner: No    Emotionally Abused: No    Physically Abused: No    Sexually Abused: No   Family Status  Relation Name Status   Mother  Alive   Father  Alive   Sister  Alive   Mat Uncle  (Not Specified)   Mat Uncle  (Not Specified)   MGF  (Not Specified)   MGM  (Not Specified)   PGF  (Not Specified)   PGM  (Not Specified)  No partnership data on file   Family History  Problem Relation Age of Onset   Asthma Mother    Depression Father    Stroke Father    Heart disease Father    Asthma Father    Hypertension Father        pulmonary hypertension   Heart disease Maternal Uncle     Leukemia Maternal Uncle    Diabetes Maternal Grandfather    Hypertension Maternal Grandmother    COPD Maternal Grandmother    Diabetes Paternal Grandfather    Stroke Paternal Grandmother    Diabetes Paternal Grandmother    Allergies  Allergen Reactions   Concerta [Methylphenidate] Other (See Comments)    hallucinations   Terbinafine Other (See Comments)   Trazodone And Nefazodone Other (See Comments)    Pt stated she has nightmares   Clindamycin Hcl Rash   Haldol [Haloperidol] Anxiety    Unable to seat still   Terbinafine Hcl Nausea And Vomiting      Review of Systems  Constitutional:  Negative for chills, fever and malaise/fatigue.  HENT:  Positive for ear pain. Negative for congestion and hearing loss.   Eyes:  Negative for discharge.  Respiratory:  Negative for cough, sputum production and shortness of breath.   Cardiovascular:  Negative for chest pain, palpitations and leg swelling.  Gastrointestinal:  Negative for abdominal pain, blood in stool, constipation, diarrhea, heartburn, nausea and vomiting.  Genitourinary:  Negative for dysuria, frequency, hematuria and urgency.  Musculoskeletal:  Negative for back pain, falls and myalgias.  Skin:  Negative for rash.  Neurological:  Negative for dizziness, sensory change, loss of consciousness, weakness and headaches.  Endo/Heme/Allergies:  Negative for environmental allergies. Does not bruise/bleed easily.  Psychiatric/Behavioral:  Negative for depression and suicidal ideas. The patient is not nervous/anxious and does not have insomnia.       Objective:     BP 119/76 (BP Location: Left Arm, Patient Position: Sitting, Cuff Size: Large)   Pulse (!) 102   Temp 98.8 F (37.1 C) (Oral)   Resp 16   Ht 5\' 4"  (1.626 m)   Wt 254 lb (115.2 kg)   SpO2 98%   BMI 43.60 kg/m  BP Readings from Last 3 Encounters:  07/18/23 119/76  05/30/23 101/76  05/22/23 109/84   Wt Readings from Last 3 Encounters:  07/18/23 254 lb (115.2  kg)  05/30/23 256 lb (116.1 kg)  05/22/23 253 lb (114.8 kg)   SpO2 Readings from Last 3 Encounters:  07/18/23 98%  05/30/23 96%  01/17/23 99%      Physical Exam Vitals and nursing note reviewed.  Constitutional:      General: She is not  in acute distress.    Appearance: Normal appearance. She is well-developed.  HENT:     Head: Normocephalic and atraumatic.     Right Ear: A middle ear effusion is present.     Left Ear: A middle ear effusion is present.  Eyes:     General: No scleral icterus.       Right eye: No discharge.        Left eye: No discharge.  Cardiovascular:     Rate and Rhythm: Normal rate and regular rhythm.     Heart sounds: No murmur heard. Pulmonary:     Effort: Pulmonary effort is normal. No respiratory distress.     Breath sounds: Normal breath sounds.  Musculoskeletal:        General: Normal range of motion.     Cervical back: Normal range of motion and neck supple.     Right lower leg: No edema.     Left lower leg: No edema.  Skin:    General: Skin is warm and dry.  Neurological:     Mental Status: She is alert and oriented to person, place, and time.  Psychiatric:        Mood and Affect: Mood normal.        Behavior: Behavior normal.        Thought Content: Thought content normal.        Judgment: Judgment normal.      No results found for any visits on 07/18/23.  Last CBC Lab Results  Component Value Date   WBC 7.8 05/21/2022   HGB 12.4 05/21/2022   HCT 37.2 05/21/2022   MCV 89.7 05/21/2022   MCH 29.8 11/16/2021   RDW 14.3 05/21/2022   PLT 338.0 05/21/2022   Last metabolic panel Lab Results  Component Value Date   GLUCOSE 93 05/21/2022   NA 135 05/21/2022   K 4.3 05/21/2022   CL 103 05/21/2022   CO2 22 05/21/2022   BUN 10 05/21/2022   CREATININE 0.70 05/21/2022   GFR 117.80 05/21/2022   CALCIUM 8.8 05/21/2022   PROT 6.7 05/21/2022   ALBUMIN 3.8 05/21/2022   BILITOT 0.4 05/21/2022   ALKPHOS 57 05/21/2022   AST 11  05/21/2022   ALT 10 05/21/2022   ANIONGAP 12 07/02/2020   Last lipids Lab Results  Component Value Date   CHOL 196 10/06/2020   HDL 54.30 10/06/2020   LDLCALC 121 (H) 10/06/2020   TRIG 100.0 10/06/2020   CHOLHDL 4 10/06/2020   Last hemoglobin A1c Lab Results  Component Value Date   HGBA1C 5.0 07/02/2020   Last thyroid functions Lab Results  Component Value Date   TSH 0.23 (L) 05/21/2022   T4TOTAL 8.1 06/16/2018   Last vitamin D Lab Results  Component Value Date   VD25OH 43.31 05/21/2022   Last vitamin B12 and Folate Lab Results  Component Value Date   VITAMINB12 912 (H) 05/19/2019      The ASCVD Risk score (Arnett DK, et al., 2019) failed to calculate for the following reasons:   The 2019 ASCVD risk score is only valid for ages 22 to 69    Assessment & Plan:   Problem List Items Addressed This Visit       Unprioritized   Bilateral chronic serous otitis media - Primary  Assessment and Plan    Eustachian Tube Dysfunction   She presents with a sensation of clogged ears without pain, likely due to fluid behind the eardrum, with no signs of infection  or earwax impaction. Despite using nasal sprays and antihistamines, symptoms persist. We discussed the potential benefits and risks of pseudoephedrine, noting concerns about drug interactions and side effects. Prednisone was considered but may cause mood changes. She was informed that fluid behind the eardrum could take a long time to clear. We will continue Astelin and Flonase nasal sprays, consider pseudoephedrine for no more than one week while monitoring blood pressure and heart rate, and if symptoms persist, consider an ENT referral.  Chronic Cough   Her chronic cough has improved with montelukast, and reflux is considered a potential contributing factor. Following the adoption of a vegan diet, she reports symptom improvement. We discussed that Pepcid or omeprazole might be recommended by pulmonology. We will continue  montelukast, consider starting Pepcid or omeprazole for reflux management, and monitor for further improvement in cough.  Allergic Rhinitis   She is currently taking cetirizine for allergic rhinitis, with symptoms partially controlled but still present. We will continue cetirizine.  General Health Maintenance   We discussed the importance of maintaining a healthy diet and ensuring adequate protein intake, especially following the recent adoption of a vegan diet. She should ensure adequate protein intake with her vegan diet.  Follow-up   She will follow up with pulmonology in January.        Return if symptoms worsen or fail to improve.    [name], DO

## 2023-07-18 NOTE — Patient Instructions (Signed)
Eustachian Tube Dysfunction  Eustachian tube dysfunction refers to a condition in which a blockage develops in the narrow passage that connects the middle ear to the back of the nose (eustachian tube). The eustachian tube regulates air pressure in the middle ear by letting air move between the ear and nose. It also helps to drain fluid from the middle ear space. Eustachian tube dysfunction can affect one or both ears. When the eustachian tube does not function properly, air pressure, fluid, or both can build up in the middle ear. What are the causes? This condition occurs when the eustachian tube becomes blocked or cannot open normally. Common causes of this condition include: Ear infections. Colds and other infections that affect the nose, mouth, and throat (upper respiratory tract). Allergies. Irritation from cigarette smoke. Irritation from stomach acid coming up into the esophagus (gastroesophageal reflux). The esophagus is the part of the body that moves food from the mouth to the stomach. Sudden changes in air pressure, such as from descending in an airplane or scuba diving. Abnormal growths in the nose or throat, such as: Growths that line the nose (nasal polyps). Abnormal growth of cells (tumors). Enlarged tissue at the back of the throat (adenoids). What increases the risk? You are more likely to develop this condition if: You smoke. You are overweight. You are a child who has: Certain birth defects of the mouth, such as cleft palate. Large tonsils or adenoids. What are the signs or symptoms? Common symptoms of this condition include: A feeling of fullness in the ear. Ear pain. Clicking or popping noises in the ear. Ringing in the ear (tinnitus). Hearing loss. Loss of balance. Dizziness. Symptoms may get worse when the air pressure around you changes, such as when you travel to an area of high elevation, fly on an airplane, or go scuba diving. How is this diagnosed? This  condition may be diagnosed based on: Your symptoms. A physical exam of your ears, nose, and throat. Tests, such as those that measure: The movement of your eardrum. Your hearing (audiometry). How is this treated? Treatment depends on the cause and severity of your condition. In mild cases, you may relieve your symptoms by moving air into your ears. This is called "popping the ears." In more severe cases, or if you have symptoms of fluid in your ears, treatment may include: Medicines to relieve congestion (decongestants). Medicines that treat allergies (antihistamines). Nasal sprays or ear drops that contain medicines that reduce swelling (steroids). A procedure to drain the fluid in your eardrum. In this procedure, a small tube may be placed in the eardrum to: Drain the fluid. Restore the air in the middle ear space. A procedure to insert a balloon device through the nose to inflate the opening of the eustachian tube (balloon dilation). Follow these instructions at home: Lifestyle Do not do any of the following until your health care provider approves: Travel to high altitudes. Fly in airplanes. Work in a pressurized cabin or room. Scuba dive. Do not use any products that contain nicotine or tobacco. These products include cigarettes, chewing tobacco, and vaping devices, such as e-cigarettes. If you need help quitting, ask your health care provider. Keep your ears dry. Wear fitted earplugs during showering and bathing. Dry your ears completely after. General instructions Take over-the-counter and prescription medicines only as told by your health care provider. Use techniques to help pop your ears as recommended by your health care provider. These may include: Chewing gum. Yawning. Frequent, forceful swallowing.   Closing your mouth, holding your nose closed, and gently blowing as if you are trying to blow air out of your nose. Keep all follow-up visits. This is important. Contact a  health care provider if: Your symptoms do not go away after treatment. Your symptoms come back after treatment. You are unable to pop your ears. You have: A fever. Pain in your ear. Pain in your head or neck. Fluid draining from your ear. Your hearing suddenly changes. You become very dizzy. You lose your balance. Get help right away if: You have a sudden, severe increase in any of your symptoms. Summary Eustachian tube dysfunction refers to a condition in which a blockage develops in the eustachian tube. It can be caused by ear infections, allergies, inhaled irritants, or abnormal growths in the nose or throat. Symptoms may include ear pain or fullness, hearing loss, or ringing in the ears. Mild cases are treated with techniques to unblock the ears, such as yawning or chewing gum. More severe cases are treated with medicines or procedures. This information is not intended to replace advice given to you by your health care provider. Make sure you discuss any questions you have with your health care provider. Document Revised: 10/10/2020 Document Reviewed: 10/10/2020 Elsevier Patient Education  2024 [employer]  

## 2023-07-23 ENCOUNTER — Other Ambulatory Visit: Payer: Self-pay

## 2023-07-23 ENCOUNTER — Other Ambulatory Visit (HOSPITAL_COMMUNITY): Payer: Self-pay

## 2023-07-23 MED ORDER — PALIPERIDONE ER 1.5 MG PO TB24
1.5000 mg | ORAL_TABLET | Freq: Every day | ORAL | 0 refills | Status: DC
  Filled 2023-07-23: qty 30, 30d supply, fill #0

## 2023-07-25 ENCOUNTER — Other Ambulatory Visit: Payer: Self-pay | Admitting: Family Medicine

## 2023-07-25 ENCOUNTER — Other Ambulatory Visit (HOSPITAL_COMMUNITY): Payer: Self-pay

## 2023-07-25 DIAGNOSIS — J40 Bronchitis, not specified as acute or chronic: Secondary | ICD-10-CM

## 2023-07-25 MED ORDER — QVAR REDIHALER 40 MCG/ACT IN AERB
2.0000 | INHALATION_SPRAY | Freq: Two times a day (BID) | RESPIRATORY_TRACT | 5 refills | Status: AC
  Filled 2023-07-30: qty 10.6, 30d supply, fill #0
  Filled 2023-08-28: qty 10.6, 30d supply, fill #1
  Filled 2023-09-24: qty 10.6, 30d supply, fill #2

## 2023-07-26 ENCOUNTER — Other Ambulatory Visit (HOSPITAL_COMMUNITY): Payer: Self-pay

## 2023-07-30 ENCOUNTER — Other Ambulatory Visit (HOSPITAL_COMMUNITY): Payer: Self-pay

## 2023-07-30 ENCOUNTER — Telehealth: Payer: Self-pay | Admitting: Cardiology

## 2023-07-30 ENCOUNTER — Other Ambulatory Visit: Payer: Self-pay | Admitting: Cardiology

## 2023-07-30 MED ORDER — PROPRANOLOL HCL 10 MG PO TABS
5.0000 mg | ORAL_TABLET | Freq: Every day | ORAL | 0 refills | Status: DC
  Filled 2023-07-30: qty 15, 30d supply, fill #0

## 2023-07-30 NOTE — Telephone Encounter (Signed)
*  STAT* If patient is at the pharmacy, call can be transferred to refill team.   1. Which medications need to be refilled? (please list name of each medication and dose if known) propranolol (INDERAL) 10 MG tablet   2. Which pharmacy/location (including street and city if local pharmacy) is medication to be sent to?  [place] [facility]      3. Do they need a 30 day or 90 day supply? 90 day

## 2023-07-31 ENCOUNTER — Other Ambulatory Visit (HOSPITAL_COMMUNITY): Payer: Self-pay

## 2023-07-31 ENCOUNTER — Other Ambulatory Visit: Payer: Self-pay

## 2023-07-31 ENCOUNTER — Encounter (HOSPITAL_COMMUNITY): Payer: Self-pay

## 2023-07-31 MED ORDER — INVEGA HAFYERA 1560 MG/5ML IM SUSY
PREFILLED_SYRINGE | INTRAMUSCULAR | 0 refills | Status: DC
  Filled 2023-07-31: qty 5, 90d supply, fill #0

## 2023-07-31 NOTE — Progress Notes (Signed)
Specialty Pharmacy Initial Fill Coordination Note  [name] is a 27 y.o. female contacted today regarding initial fill of specialty medication(s) Paliperidone Palmitate [name])   Patient requested Courier to Provider Office   Delivery date: 08/02/23   Verified address: Dr. [name] -[id number]   Medication will be filled on 12/19.   Patient is aware of $0 copayment.

## 2023-08-01 ENCOUNTER — Other Ambulatory Visit: Payer: Self-pay

## 2023-08-01 ENCOUNTER — Other Ambulatory Visit (HOSPITAL_COMMUNITY): Payer: Self-pay

## 2023-08-02 ENCOUNTER — Other Ambulatory Visit (HOSPITAL_COMMUNITY): Payer: Self-pay

## 2023-08-02 ENCOUNTER — Other Ambulatory Visit: Payer: Self-pay

## 2023-08-02 MED ORDER — GUANFACINE HCL ER 1 MG PO TB24
1.0000 mg | ORAL_TABLET | Freq: Every morning | ORAL | 0 refills | Status: AC
  Filled 2023-08-02: qty 90, 90d supply, fill #0

## 2023-08-03 ENCOUNTER — Other Ambulatory Visit (HOSPITAL_COMMUNITY): Payer: Self-pay

## 2023-08-03 ENCOUNTER — Other Ambulatory Visit: Payer: Self-pay | Admitting: Cardiology

## 2023-08-05 ENCOUNTER — Other Ambulatory Visit (HOSPITAL_COMMUNITY): Payer: Self-pay

## 2023-08-05 MED ORDER — PROPRANOLOL HCL 10 MG PO TABS
5.0000 mg | ORAL_TABLET | Freq: Every day | ORAL | 2 refills | Status: DC
  Filled 2023-08-05 – 2023-08-24 (×2): qty 15, 30d supply, fill #0

## 2023-08-09 ENCOUNTER — Other Ambulatory Visit (HOSPITAL_COMMUNITY): Payer: Self-pay

## 2023-08-09 MED ORDER — BUPROPION HCL ER (XL) 150 MG PO TB24
150.0000 mg | ORAL_TABLET | Freq: Every morning | ORAL | 1 refills | Status: DC
  Filled 2023-08-09: qty 90, 90d supply, fill #0
  Filled 2023-11-09: qty 90, 90d supply, fill #1

## 2023-08-10 ENCOUNTER — Other Ambulatory Visit (HOSPITAL_COMMUNITY): Payer: Self-pay

## 2023-08-13 ENCOUNTER — Other Ambulatory Visit (HOSPITAL_COMMUNITY): Payer: Self-pay

## 2023-08-15 ENCOUNTER — Other Ambulatory Visit (HOSPITAL_COMMUNITY): Payer: Self-pay

## 2023-08-17 ENCOUNTER — Other Ambulatory Visit (HOSPITAL_COMMUNITY): Payer: Self-pay

## 2023-08-24 ENCOUNTER — Other Ambulatory Visit (HOSPITAL_COMMUNITY): Payer: Self-pay

## 2023-08-26 ENCOUNTER — Other Ambulatory Visit (HOSPITAL_COMMUNITY): Payer: Self-pay

## 2023-08-26 ENCOUNTER — Other Ambulatory Visit (HOSPITAL_BASED_OUTPATIENT_CLINIC_OR_DEPARTMENT_OTHER): Payer: Self-pay

## 2023-08-26 ENCOUNTER — Other Ambulatory Visit: Payer: Self-pay

## 2023-08-26 MED ORDER — PALIPERIDONE ER 1.5 MG PO TB24
1.5000 mg | ORAL_TABLET | Freq: Every day | ORAL | 2 refills | Status: DC
  Filled 2023-08-26: qty 30, 30d supply, fill #0
  Filled 2023-09-24: qty 30, 30d supply, fill #1
  Filled 2023-10-17: qty 30, 30d supply, fill #2

## 2023-08-27 ENCOUNTER — Encounter: Payer: Self-pay | Admitting: Family Medicine

## 2023-08-27 ENCOUNTER — Ambulatory Visit (INDEPENDENT_AMBULATORY_CARE_PROVIDER_SITE_OTHER): Payer: No Typology Code available for payment source | Admitting: Family Medicine

## 2023-08-27 ENCOUNTER — Other Ambulatory Visit (HOSPITAL_COMMUNITY): Payer: Self-pay

## 2023-08-27 VITALS — BP 108/78 | HR 119 | Temp 98.7°F | Resp 18 | Ht 64.0 in | Wt 252.4 lb

## 2023-08-27 DIAGNOSIS — J40 Bronchitis, not specified as acute or chronic: Secondary | ICD-10-CM

## 2023-08-27 DIAGNOSIS — Z20828 Contact with and (suspected) exposure to other viral communicable diseases: Secondary | ICD-10-CM

## 2023-08-27 MED ORDER — PROMETHAZINE-DM 6.25-15 MG/5ML PO SYRP
5.0000 mL | ORAL_SOLUTION | Freq: Four times a day (QID) | ORAL | 0 refills | Status: DC | PRN
  Filled 2023-08-27: qty 118, 6d supply, fill #0

## 2023-08-27 MED ORDER — PREDNISONE 10 MG PO TABS
ORAL_TABLET | ORAL | 0 refills | Status: DC
  Filled 2023-08-27: qty 20, 12d supply, fill #0

## 2023-08-27 MED ORDER — AZITHROMYCIN 250 MG PO TABS
ORAL_TABLET | ORAL | 0 refills | Status: DC
  Filled 2023-08-27: qty 6, 5d supply, fill #0

## 2023-08-27 NOTE — Patient Instructions (Signed)

## 2023-08-27 NOTE — Progress Notes (Signed)
 Established Patient Office Visit  Subjective   Patient ID: [name], female    DOB: 10/23/1995  Age: 28 y.o. MRN: [id number]  Chief Complaint  Patient presents with   Cough    Pt states exposed to RSV and Flu around christmas.     HPI Discussed the use of AI scribe software for clinical note transcription with the patient, who gave verbal consent to proceed.  History of Present Illness   The patient, with a history of chronic cough since last March, presents with an acute exacerbation of symptoms. She reports an increase in cough severity and phlegm production over the past week, which she initially attributed to a cold or flu. The cough has been so severe that it has led to episodes of vomiting phlegm. The patient has been self-medicating with over-the-counter cold and flu medications, as well as Coricidin, but these have provided limited relief. She has also been using an albuterol  inhaler daily, which she reports is the only thing that helps her cough.  The patient denies chest tightness but notes that her cough worsens when she cries. She has not had a fever, but has experienced intermittent low-grade temperatures. She has been on a regimen of Qvar  and albuterol  for suspected asthmatic symptoms, and has been using the albuterol  inhaler once or twice daily.  The patient also takes propranolol , but it is unclear if this is contributing to her cough. She has an upcoming appointment with a cardiologist to further evaluate this. The patient's mother, who was present during the consultation, expressed interest in a referral to a pulmonologist due to the chronic nature of the patient's cough.      Patient Active Problem List   Diagnosis Date Noted   Bilateral chronic serous otitis media 07/18/2023   Chronic cough 05/30/2023   Abdominal pressure 01/10/2023   Abnormal urine 01/10/2023   Vaginal discharge 11/24/2021   Gastroenteritis 06/13/2021   Bronchitis 04/18/2021   Seizure  disorder (HCC) 10/25/2020   Thrush 10/25/2020   Amenorrhea 10/06/2020   Ear pain, left 10/06/2020   Schizo-affective schizophrenia (HCC)    Allergy    ADHD (attention deficit hyperactivity disorder)    Depression    Bipolar I disorder (HCC) 05/12/2020   Benzodiazepine dependence (HCC) 05/12/2020   Morbid obesity (HCC) 09/06/2019   Possible pregnancy 09/06/2019   Delirium tremens (HCC) 05/20/2019   Seizure (HCC) 05/20/2019   Acute vaginitis 05/20/2019   High risk sexual behavior 05/20/2019   Vitamin D  deficiency 03/10/2019   Constipation 04/03/2018   Schizoaffective disorder (HCC) 03/08/2018   Bipolar disorder, current episode manic severe with psychotic features (HCC)    MDD (major depressive disorder), recurrent severe, without psychosis (HCC) 02/11/2018   Pelvic pain 01/31/2018   Genital herpes simplex 10/24/2017   Anxiety 08/30/2017   Severe bipolar I disorder, current or most recent episode depressed (HCC) 03/30/2017   Syncope 03/03/2017   STD exposure 05/13/2015   Eczema 12/11/2013   Encounter for contraceptive management 03/16/2013   Bipolar disorder (HCC) 12/30/2012   ADD (attention deficit disorder) 04/24/2011   Past Medical History:  Diagnosis Date   Acute vaginitis 05/20/2019   ADD (attention deficit disorder) 04/24/2011   ADHD (attention deficit hyperactivity disorder)    Allergy    Anxiety    Benzodiazepine dependence (HCC) 05/12/2020   Bipolar disorder (HCC)    Bipolar disorder, current episode manic severe with psychotic features (HCC)    Bipolar I disorder (HCC) 05/12/2020   Constipation 04/03/2018  Delirium tremens (HCC) 05/20/2019   Depression    Eczema 12/11/13   Genital herpes simplex 10/24/2017   High risk heterosexual behavior 05/20/2019   MDD (major depressive disorder), recurrent severe, without psychosis (HCC) 02/11/2018   Morbid obesity (HCC) 09/06/2019   Pelvic pain 01/31/2018   Possible pregnancy 09/06/2019   Preventative health care 03/16/2013    Schizo-affective schizophrenia (HCC)    Schizoaffective disorder (HCC) 03/08/2018   Seizure (HCC) 05/20/2019   Severe bipolar I disorder, current or most recent episode depressed (HCC) 03/30/2017   STD exposure 05/13/2015   Syncope 03/03/2017   Vitamin D  deficiency 03/10/2019   No past surgical history on file. Social History   Tobacco Use   Smoking status: Never   Smokeless tobacco: Never  Vaping Use   Vaping status: Former   Quit date: 09/01/2017  Substance Use Topics   Alcohol use: Not Currently    Comment: occasionally   Drug use: Not Currently   Social History   Socioeconomic History   Marital status: Single    Spouse name: Not on file   Number of children: 0   Years of education: Not on file   Highest education level: Associate degree: academic program  Occupational History   Occupation: Unemployed  Tobacco Use   Smoking status: Never   Smokeless tobacco: Never  Vaping Use   Vaping status: Former   Quit date: 09/01/2017  Substance and Sexual Activity   Alcohol use: Not Currently    Comment: occasionally   Drug use: Not Currently   Sexual activity: Not Currently    Birth control/protection: Pill  Other Topics Concern   Not on file  Social History Narrative   Lives with mom in an apartment on the second floor.  No children.  Currently not working.  Education: college. Left handed   Drinks no caffiene   Lives with mom, sista and niece   Social Drivers of Corporate Investment Banker Strain: Low Risk  (07/11/2022)   Overall Financial Resource Strain (CARDIA)    Difficulty of Paying Living Expenses: Not hard at all  Food Insecurity: No Food Insecurity (07/11/2022)   Hunger Vital Sign    Worried About Running Out of Food in the Last Year: Never true    Ran Out of Food in the Last Year: Never true  Transportation Needs: No Transportation Needs (07/11/2022)   PRAPARE - Administrator, Civil Service (Medical): No    Lack of Transportation (Non-Medical):  No  Physical Activity: Inactive (07/11/2022)   Exercise Vital Sign    Days of Exercise per Week: 0 days    Minutes of Exercise per Session: 0 min  Stress: No Stress Concern Present (07/11/2022)   [employer] of Occupational Health - Occupational Stress Questionnaire    Feeling of Stress : Only a little  Social Connections: Moderately Isolated (07/11/2022)   Social Connection and Isolation Panel [NHANES]    Frequency of Communication with Friends and Family: More than three times a week    Frequency of Social Gatherings with Friends and Family: More than three times a week    Attends Religious Services: 1 to 4 times per year    Active Member of [employer] or Organizations: No    Attends Banker Meetings: Never    Marital Status: Never married  Intimate Partner Violence: Not At Risk (07/11/2022)   Humiliation, Afraid, Rape, and Kick questionnaire    Fear of Current or Ex-Partner: No    Emotionally  Abused: No    Physically Abused: No    Sexually Abused: No   Family Status  Relation Name Status   Mother  Alive   Father  Alive   Sister  Alive   Mat Uncle  (Not Specified)   Mat Uncle  (Not Specified)   MGF  (Not Specified)   MGM  (Not Specified)   PGF  (Not Specified)   PGM  (Not Specified)  No partnership data on file   Family History  Problem Relation Age of Onset   Asthma Mother    Depression Father    Stroke Father    Heart disease Father    Asthma Father    Hypertension Father        pulmonary hypertension   Heart disease Maternal Uncle    Leukemia Maternal Uncle    Diabetes Maternal Grandfather    Hypertension Maternal Grandmother    COPD Maternal Grandmother    Diabetes Paternal Grandfather    Stroke Paternal Grandmother    Diabetes Paternal Grandmother    Allergies  Allergen Reactions   Concerta [Methylphenidate] Other (See Comments)    hallucinations   Terbinafine Other (See Comments)   Trazodone  And Nefazodone Other (See Comments)    Pt  stated she has nightmares   Clindamycin Hcl Rash   Haldol  [Haloperidol ] Anxiety    Unable to seat still   Terbinafine Hcl Nausea And Vomiting      Review of Systems  Constitutional:  Negative for chills, fever and malaise/fatigue.  HENT:  Negative for congestion and hearing loss.   Eyes:  Negative for blurred vision and discharge.  Respiratory:  Negative for cough, sputum production and shortness of breath.   Cardiovascular:  Negative for chest pain, palpitations and leg swelling.  Gastrointestinal:  Negative for abdominal pain, blood in stool, constipation, diarrhea, heartburn, nausea and vomiting.  Genitourinary:  Negative for dysuria, frequency, hematuria and urgency.  Musculoskeletal:  Negative for back pain, falls and myalgias.  Skin:  Negative for rash.  Neurological:  Negative for dizziness, sensory change, loss of consciousness, weakness and headaches.  Endo/Heme/Allergies:  Negative for environmental allergies. Does not bruise/bleed easily.  Psychiatric/Behavioral:  Negative for depression and suicidal ideas. The patient is not nervous/anxious and does not have insomnia.       Objective:     BP 108/78 (BP Location: Left Arm, Patient Position: Sitting, Cuff Size: Large)   Pulse (!) 119   Temp 98.7 F (37.1 C) (Oral)   Resp 18   Ht 5' 4 (1.626 m)   Wt 252 lb 6.4 oz (114.5 kg)   SpO2 98%   BMI 43.32 kg/m  BP Readings from Last 3 Encounters:  08/27/23 108/78  07/18/23 119/76  05/30/23 101/76   Wt Readings from Last 3 Encounters:  08/27/23 252 lb 6.4 oz (114.5 kg)  07/18/23 254 lb (115.2 kg)  05/30/23 256 lb (116.1 kg)   SpO2 Readings from Last 3 Encounters:  08/27/23 98%  07/18/23 98%  05/30/23 96%      Physical Exam Vitals and nursing note reviewed.  Constitutional:      General: She is not in acute distress.    Appearance: Normal appearance. She is well-developed.  HENT:     Head: Normocephalic and atraumatic.  Eyes:     General: No scleral  icterus.       Right eye: No discharge.        Left eye: No discharge.  Cardiovascular:     Rate and  Rhythm: Normal rate and regular rhythm.     Heart sounds: No murmur heard. Pulmonary:     Effort: Pulmonary effort is normal. No respiratory distress.     Breath sounds: Wheezing present.  Musculoskeletal:        General: Normal range of motion.     Cervical back: Normal range of motion and neck supple.     Right lower leg: No edema.     Left lower leg: No edema.  Skin:    General: Skin is warm and dry.  Neurological:     Mental Status: She is alert and oriented to person, place, and time.  Psychiatric:        Mood and Affect: Mood normal.        Behavior: Behavior normal.        Thought Content: Thought content normal.        Judgment: Judgment normal.      No results found for any visits on 08/27/23.  Last CBC Lab Results  Component Value Date   WBC 7.8 05/21/2022   HGB 12.4 05/21/2022   HCT 37.2 05/21/2022   MCV 89.7 05/21/2022   MCH 29.8 11/16/2021   RDW 14.3 05/21/2022   PLT 338.0 05/21/2022   Last metabolic panel Lab Results  Component Value Date   GLUCOSE 93 05/21/2022   NA 135 05/21/2022   K 4.3 05/21/2022   CL 103 05/21/2022   CO2 22 05/21/2022   BUN 10 05/21/2022   CREATININE 0.70 05/21/2022   GFR 117.80 05/21/2022   CALCIUM 8.8 05/21/2022   PROT 6.7 05/21/2022   ALBUMIN 3.8 05/21/2022   BILITOT 0.4 05/21/2022   ALKPHOS 57 05/21/2022   AST 11 05/21/2022   ALT 10 05/21/2022   ANIONGAP 12 07/02/2020   Last lipids Lab Results  Component Value Date   CHOL 196 10/06/2020   HDL 54.30 10/06/2020   LDLCALC 121 (H) 10/06/2020   TRIG 100.0 10/06/2020   CHOLHDL 4 10/06/2020   Last hemoglobin A1c Lab Results  Component Value Date   HGBA1C 5.0 07/02/2020   Last thyroid  functions Lab Results  Component Value Date   TSH 0.23 (L) 05/21/2022   T4TOTAL 8.1 06/16/2018   Last vitamin D  Lab Results  Component Value Date   VD25OH 43.31 05/21/2022    Last vitamin B12 and Folate Lab Results  Component Value Date   VITAMINB12 912 (H) 05/19/2019      The ASCVD Risk score (Arnett DK, et al., 2019) failed to calculate for the following reasons:   The 2019 ASCVD risk score is only valid for ages 36 to 74    Assessment & Plan:   Problem List Items Addressed This Visit       Unprioritized   Bronchitis   Relevant Medications   azithromycin  (ZITHROMAX  Z-PAK) 250 MG tablet   predniSONE  (DELTASONE ) 10 MG tablet   promethazine -dextromethorphan (PROMETHAZINE -DM) 6.25-15 MG/5ML syrup   Other Visit Diagnoses       RSV exposure    -  Primary   Relevant Orders   COVID-19, Flu A+B and RSV     Assessment and Plan    Chronic Cough with Acute Exacerbation Chronic cough since March, likely post-COVID, has acutely worsened over the past weekend with severe coughing, phlegm production, and occasional vomiting of phlegm. No significant fever is present. Current medications include albuterol  and Qvar . The recent exacerbation may be due to a viral infection, with differential diagnoses including asthma, post-viral cough, and possible secondary bacterial  infection. Prednisone  taper is prescribed to reduce inflammation and improve symptoms, with informed consent obtained regarding potential weight gain. A Z-Pak is prescribed for potential bacterial infection, with side effects discussed. Cough syrup is ordered for nighttime symptom management. A swab for flu, COVID, and RSV testing is sent out. Consider increasing the Qvar  dose if prednisone  does not improve symptoms.  Asthma Asthmatic symptoms are managed with montelukast , Qvar , and albuterol . There is increased use of albuterol  recently due to cough exacerbation, but no chest tightness is reported. Continuing current asthma medications and monitoring response to prednisone  is important. The Qvar  dose may be increased if symptoms do not improve. Continue Qvar  morning and night, and albuterol  as  needed, up to twice daily. Monitor response to prednisone  and adjust Qvar  dose if necessary.  Follow-up Follow up with Dr. Elder in February. Consider referral to a different pulmonologist if unable to see Dr. Peg.        Return if symptoms worsen or fail to improve.    Tyerra Loretto R Lowne Chase, DO

## 2023-08-28 ENCOUNTER — Other Ambulatory Visit (HOSPITAL_COMMUNITY): Payer: Self-pay

## 2023-08-28 LAB — COVID-19, FLU A+B AND RSV
Influenza A, NAA: NOT DETECTED
Influenza B, NAA: NOT DETECTED
RSV, NAA: NOT DETECTED
SARS-CoV-2, NAA: NOT DETECTED

## 2023-08-29 ENCOUNTER — Encounter: Payer: Self-pay | Admitting: Family Medicine

## 2023-09-03 ENCOUNTER — Other Ambulatory Visit: Payer: Self-pay

## 2023-09-03 ENCOUNTER — Other Ambulatory Visit (HOSPITAL_COMMUNITY): Payer: Self-pay | Admitting: Family Medicine

## 2023-09-03 ENCOUNTER — Ambulatory Visit (HOSPITAL_COMMUNITY)
Admission: EM | Admit: 2023-09-03 | Discharge: 2023-09-03 | Disposition: A | Discharge status: Home / Self Care | Payer: No Typology Code available for payment source | Attending: Family Medicine | Admitting: Family Medicine

## 2023-09-03 ENCOUNTER — Encounter (HOSPITAL_COMMUNITY): Payer: Self-pay | Admitting: *Deleted

## 2023-09-03 DIAGNOSIS — N6452 Nipple discharge: Secondary | ICD-10-CM

## 2023-09-03 DIAGNOSIS — R5383 Other fatigue: Secondary | ICD-10-CM | POA: Diagnosis not present

## 2023-09-03 LAB — COMPREHENSIVE METABOLIC PANEL
ALT: 15 U/L (ref 0–44)
AST: 13 U/L — ABNORMAL LOW (ref 15–41)
Albumin: 3.7 g/dL (ref 3.5–5.0)
Alkaline Phosphatase: 81 U/L (ref 38–126)
Anion gap: 10 (ref 5–15)
BUN: 12 mg/dL (ref 6–20)
CO2: 22 mmol/L (ref 22–32)
Calcium: 9.5 mg/dL (ref 8.9–10.3)
Chloride: 103 mmol/L (ref 98–111)
Creatinine, Ser: 0.74 mg/dL (ref 0.44–1.00)
GFR, Estimated: >60 mL/min (ref >60)
Glucose, Bld: 89 mg/dL (ref 70–99)
Potassium: 4.1 mmol/L (ref 3.5–5.1)
Sodium: 135 mmol/L (ref 135–145)
Total Bilirubin: 0.9 mg/dL (ref 0.0–1.2)
Total Protein: 7.1 g/dL (ref 6.5–8.1)

## 2023-09-03 LAB — CBC WITH DIFFERENTIAL/PLATELET
Abs Immature Granulocytes: 0.03 10*3/uL (ref 0.00–0.07)
Basophils Absolute: 0 10*3/uL (ref 0.0–0.1)
Basophils Relative: 1 %
Eosinophils Absolute: 0.2 10*3/uL (ref 0.0–0.5)
Eosinophils Relative: 2 %
HCT: 40.5 % (ref 36.0–46.0)
Hemoglobin: 14 g/dL (ref 12.0–15.0)
Immature Granulocytes: 0 %
Lymphocytes Relative: 33 %
Lymphs Abs: 2.8 10*3/uL (ref 0.7–4.0)
MCH: 29.6 pg (ref 26.0–34.0)
MCHC: 34.6 g/dL (ref 30.0–36.0)
MCV: 85.6 fL (ref 80.0–100.0)
Monocytes Absolute: 0.9 10*3/uL (ref 0.1–1.0)
Monocytes Relative: 10 %
Neutro Abs: 4.7 10*3/uL (ref 1.7–7.7)
Neutrophils Relative %: 54 %
Platelets: 297 10*3/uL (ref 150–400)
RBC: 4.73 MIL/uL (ref 3.87–5.11)
RDW: 14.3 % (ref 11.5–15.5)
WBC: 8.5 10*3/uL (ref 4.0–10.5)
nRBC: 0 % (ref 0.0–0.2)

## 2023-09-03 LAB — POCT URINE PREGNANCY: Preg Test, Ur: NEGATIVE

## 2023-09-03 LAB — TSH: TSH: 1.498 u[IU]/mL (ref 0.350–4.500)

## 2023-09-03 NOTE — ED Provider Notes (Signed)
[facility]    CSN: [id number] Arrival date & time: 09/03/23  1125      History   Chief Complaint Chief Complaint  Patient presents with   Breast Discharge    HPI [name] is a 28 y.o. female.   She noted right nipple d/c yesterday.  It was tan in color, milky.  Has not noticed any since.  No pain.  She has not had periods since being on Invega She is on several psych medications.  She states she has been feeling tired as of late, just not great as well.  Eating and drinking normally.        Patient Active Problem List   Diagnosis Date Noted   Bilateral chronic serous otitis media 07/18/2023   Chronic cough 05/30/2023   Abdominal pressure 01/10/2023   Abnormal urine 01/10/2023   Vaginal discharge 11/24/2021   Gastroenteritis 06/13/2021   Bronchitis 04/18/2021   Seizure disorder (HCC) 10/25/2020   Thrush 10/25/2020   Amenorrhea 10/06/2020   Ear pain, left 10/06/2020   Schizo-affective schizophrenia (HCC)    Allergy    ADHD (attention deficit hyperactivity disorder)    Depression    Bipolar I disorder (HCC) 05/12/2020   Benzodiazepine dependence (HCC) 05/12/2020   Morbid obesity (HCC) 09/06/2019   Possible pregnancy 09/06/2019   Delirium tremens (HCC) 05/20/2019   Seizure (HCC) 05/20/2019   Acute vaginitis 05/20/2019   High risk sexual behavior 05/20/2019   Vitamin D deficiency 03/10/2019   Constipation 04/03/2018   Schizoaffective disorder (HCC) 03/08/2018   Bipolar disorder, current episode manic severe with psychotic features (HCC)    MDD (major depressive disorder), recurrent severe, without psychosis (HCC) 02/11/2018   Pelvic pain 01/31/2018   Genital herpes simplex 10/24/2017   Anxiety 08/30/2017   Severe bipolar I disorder, current or most recent episode depressed (HCC) 03/30/2017   Syncope 03/03/2017   STD exposure 05/13/2015   Eczema 12/11/2013   Encounter for contraceptive management 03/16/2013   Bipolar disorder (HCC)  12/30/2012   ADD (attention deficit disorder) 04/24/2011    History reviewed. No pertinent surgical history.  OB History   No obstetric history on file.      Home Medications    Prior to Admission medications   Medication Sig Start Date End Date Taking? Authorizing Provider  albuterol (VENTOLIN HFA) 108 (90 Base) MCG/ACT inhaler Inhale 2 puffs into the lungs every 6 (six) hours as needed for wheezing or shortness of breath. 01/17/23  Yes [name], DO  azelastine (ASTELIN) 0.1 % nasal spray Place 2 sprays into both nostrils daily as needed for rhinitis or allergies as directed 05/30/23  Yes Clayborne Dana, NP  beclomethasone (QVAR REDIHALER) 40 MCG/ACT inhaler Inhale 2 puffs into the lungs 2 (two) times daily. 07/25/23  Yes Seabron Spates R, DO  benzonatate (TESSALON) 200 MG capsule Take 1 capsule (200 mg total) by mouth 2 (two) times daily as needed for cough. 05/30/23  Yes Clayborne Dana, NP  buPROPion (WELLBUTRIN XL) 150 MG 24 hr tablet Take 1 tablet (150 mg total) by mouth every morning. 08/09/23  Yes   Ergocalciferol (VITAMIN D2) 50 MCG (2000 UT) TABS Take by mouth.   Yes [provider]  guanFACINE (INTUNIV) 1 MG TB24 ER tablet Take 1 tablet (1 mg total) by mouth every morning. 08/02/23  Yes   paliperidone (INVEGA) 1.5 MG 24 hr tablet Take 1 tablet (1.5 mg total) by mouth at bedtime. 08/26/23  Yes  QUEtiapine (SEROQUEL) 100 MG tablet Take 1 - 3 tablets by mouth at bedtime. 05/14/23  Yes   amoxicillin-clavulanate (AUGMENTIN) 875-125 MG tablet Take 1 tablet by mouth 2 (two) times daily. Patient not taking: Reported on 08/27/2023 05/30/23   Hyman Hopes B, NP  azithromycin (ZITHROMAX Z-PAK) 250 MG tablet Take as directed 08/27/23   Zola Button, Grayling Congress, DO  fluticasone (FLONASE) 50 MCG/ACT nasal spray Place 2 sprays into both nostrils daily. 05/20/23   [name], DO  levocetirizine (XYZAL) 5 MG tablet Take 1 tablet (5 mg total) by mouth every evening.  10/16/22   Seabron Spates R, DO  LORazepam (ATIVAN) 1 MG tablet Take 1 tablet (1 mg total) by mouth 2 (two) times daily. 07/08/23     montelukast (SINGULAIR) 10 MG tablet Take 1 tablet (10 mg total) by mouth at bedtime. 05/30/23   Clayborne Dana, NP  paliperidone palmitate ER (INVEGA HAFYERA) 1560 MG/5ML intramuscular injection EVERY 07/31/23     Paliperidone Palmitate ER (INVEGA HAFYERA) 1560 MG/5ML SUSY Inject 1,560 mg into the muscle every 6 (six) months. 01/22/23     predniSONE (DELTASONE) 10 MG tablet Take 3 tablets by mouth daily for 3 days, THEN 2 tablets daily for 3 days, THEN 1 tablet daily for 3 days, THEN 0.5 tablets daily for 3 days. 08/27/23 09/08/23  [name], DO  promethazine-dextromethorphan (PROMETHAZINE-DM) 6.25-15 MG/5ML syrup Take 5 mLs by mouth 4 (four) times daily as needed. 08/27/23   [name], DO  propranolol (INDERAL) 10 MG tablet Take 0.5 tablets (5 mg total) by mouth daily. 08/05/23   Tobb, Kardie, DO  valACYclovir (VALTREX) 1000 MG tablet Take 1 tablet (1,000 mg total) by mouth daily. 10/10/21   [name], DO    Family History Family History  Problem Relation Age of Onset   Asthma Mother    Depression Father    Stroke Father    Heart disease Father    Asthma Father    Hypertension Father        pulmonary hypertension   Heart disease Maternal Uncle    Leukemia Maternal Uncle    Diabetes Maternal Grandfather    Hypertension Maternal Grandmother    COPD Maternal Grandmother    Diabetes Paternal Grandfather    Stroke Paternal Grandmother    Diabetes Paternal Grandmother     Social History Social History   Tobacco Use   Smoking status: Never   Smokeless tobacco: Never  Vaping Use   Vaping status: Former   Quit date: 09/01/2017  Substance Use Topics   Alcohol use: Not Currently    Comment: occasionally   Drug use: Not Currently     Allergies   Concerta [methylphenidate], Terbinafine, Trazodone and  nefazodone, Clindamycin hcl, Haldol [haloperidol], and Terbinafine hcl   Review of Systems Review of Systems  Constitutional: Negative.   HENT: Negative.    Respiratory: Negative.    Cardiovascular: Negative.   Gastrointestinal: Negative.   Genitourinary: Negative.   Musculoskeletal: Negative.   Neurological: Negative.   Psychiatric/Behavioral: Negative.       Physical Exam Triage Vital Signs ED Triage Vitals  Encounter Vitals Group     BP 09/03/23 1207 110/78     Systolic BP Percentile --      Diastolic BP Percentile --      Pulse Rate 09/03/23 1207 (!) 106     Resp 09/03/23 1207 20     Temp 09/03/23 1207  98 F (36.7 C)     Temp src --      SpO2 09/03/23 1207 94 %     Weight --      Height --      Head Circumference --      Peak Flow --      Pain Score 09/03/23 1200 0     Pain Loc --      Pain Education --      Exclude from Growth Chart --    No data found.  Updated Vital Signs BP 110/78   Pulse (!) 106   Temp 98 F (36.7 C)   Resp 20   SpO2 94%   Visual Acuity Right Eye Distance:   Left Eye Distance:   Bilateral Distance:    Right Eye Near:   Left Eye Near:    Bilateral Near:     Physical Exam Exam conducted with a chaperone present.  Constitutional:      Appearance: Normal appearance.  Cardiovascular:     Rate and Rhythm: Normal rate and regular rhythm.  Pulmonary:     Effort: Pulmonary effort is normal.     Breath sounds: Normal breath sounds.  Chest:  Breasts:    Right: Normal. No nipple discharge.     Left: Normal. No nipple discharge.  Skin:    General: Skin is warm.  Neurological:     General: No focal deficit present.     Mental Status: She is alert.  Psychiatric:        Mood and Affect: Mood normal.      UC Treatments / Results  Labs (all labs ordered are listed, but only abnormal results are displayed) Labs Reviewed  POCT URINE PREGNANCY   UPT negative  EKG   Radiology No results found.  Procedures Procedures  (including critical care time)  Medications Ordered in UC Medications - No data to display  Initial Impression / Assessment and Plan / UC Course  I have reviewed the triage vital signs and the nursing notes.  Pertinent labs & imaging results that were available during my care of the patient were reviewed by me and considered in my medical decision making (see chart for details).   Final Clinical Impressions(s) / UC Diagnoses   Final diagnoses:  Breast discharge     Discharge Instructions      You were seen today for nipple discharge.  I have ordered blood work and a mammogram for further testing. Your blood work will be resulted tomorrow and our nurse will call you if there are any concerns.  [facility] should be contacting you to make an appointment for the mammogram.      ED Prescriptions   None    PDMP not reviewed this encounter.   Jannifer Franklin, MD 09/03/23 1246

## 2023-09-03 NOTE — ED Triage Notes (Signed)
PT reports drainage from Rt nipple yesterday.

## 2023-09-03 NOTE — Discharge Instructions (Addendum)
You were seen today for nipple discharge.  I have ordered blood work and a mammogram for further testing. Your blood work will be resulted tomorrow and our nurse will call you if there are any concerns.  [facility] should be contacting you to make an appointment for the mammogram.

## 2023-09-04 ENCOUNTER — Encounter: Payer: Self-pay | Admitting: Emergency Medicine

## 2023-09-04 LAB — PROLACTIN: Prolactin: 206 ng/mL — ABNORMAL HIGH (ref 4.8–33.4)

## 2023-09-06 ENCOUNTER — Other Ambulatory Visit (HOSPITAL_BASED_OUTPATIENT_CLINIC_OR_DEPARTMENT_OTHER): Payer: Self-pay

## 2023-09-09 ENCOUNTER — Other Ambulatory Visit (HOSPITAL_COMMUNITY): Payer: Self-pay

## 2023-09-09 ENCOUNTER — Ambulatory Visit: Payer: No Typology Code available for payment source | Admitting: Neurology

## 2023-09-09 ENCOUNTER — Ambulatory Visit: Payer: No Typology Code available for payment source | Admitting: Family Medicine

## 2023-09-09 ENCOUNTER — Other Ambulatory Visit: Payer: No Typology Code available for payment source

## 2023-09-09 ENCOUNTER — Other Ambulatory Visit: Payer: Self-pay | Admitting: Family Medicine

## 2023-09-09 ENCOUNTER — Other Ambulatory Visit: Payer: Self-pay

## 2023-09-09 DIAGNOSIS — R053 Chronic cough: Secondary | ICD-10-CM

## 2023-09-11 ENCOUNTER — Other Ambulatory Visit (HOSPITAL_COMMUNITY): Payer: Self-pay

## 2023-09-18 ENCOUNTER — Ambulatory Visit (INDEPENDENT_AMBULATORY_CARE_PROVIDER_SITE_OTHER): Payer: No Typology Code available for payment source | Admitting: Obstetrics & Gynecology

## 2023-09-18 ENCOUNTER — Other Ambulatory Visit (HOSPITAL_COMMUNITY)
Admission: RE | Admit: 2023-09-18 | Discharge: 2023-09-18 | Disposition: A | Discharge status: Home / Self Care | Payer: No Typology Code available for payment source | Source: Ambulatory Visit | Attending: Obstetrics & Gynecology | Admitting: Obstetrics & Gynecology

## 2023-09-18 VITALS — BP 108/87 | HR 87 | Ht 64.0 in | Wt 251.0 lb

## 2023-09-18 DIAGNOSIS — N643 Galactorrhea not associated with childbirth: Secondary | ICD-10-CM

## 2023-09-18 DIAGNOSIS — E221 Hyperprolactinemia: Secondary | ICD-10-CM

## 2023-09-18 DIAGNOSIS — N898 Other specified noninflammatory disorders of vagina: Secondary | ICD-10-CM | POA: Insufficient documentation

## 2023-09-18 NOTE — Patient Instructions (Signed)
GO WHITE: Soap: UNSCENTED Dove (white box light green writing) Laundry detergent (underwear)- Dreft or [employer] unscented WHITE 100% cotton panties (NOT just cotton crouch) Sanitary napkin/panty liners: UNSCENTED.  If it doesn't SAY unscented it can have a scent/perfume    NO PERFUMES OR LOTIONS OR POTIONS in the vulvar area (may use regular [place]) Condoms: hypoallergenic only. Non dyed (no color) Toilet papers: white only Wash clothes: use a separate wash cloth. WHITE.  Washed in Dreft.  

## 2023-09-18 NOTE — Progress Notes (Signed)
 History:  28 y.o. No obstetric history on file. here today for galactorrhea. Pt is on Seroquel  which causes hyperprolactinemia.  She denies pain or other related sx. She was seen in the ED for this and a lab did show an elevated prolactin. Pt also reports a vaginal discharge with an odor that was presented but, has not been noted for 4 days.     The following portions of the patient's history were reviewed and updated as appropriate: allergies, current medications, past family history, past medical history, past social history, past surgical history and problem list.  Review of Systems:  Pertinent items are noted in HPI.    Objective:  Physical Exam Height 5' 4 (1.626 m), weight 251 lb (113.9 kg), last menstrual period 09/02/2023.  CONSTITUTIONAL: Well-developed, well-nourished female in no acute distress.  HENT:  Normocephalic, atraumatic EYES: Conjunctivae and EOM are normal. No scleral icterus.  NECK: Normal range of motion SKIN: Skin is warm and dry. No rash noted. Not diaphoretic.No pallor. NEUROLGIC: Alert and oriented to person, place, and time. Normal coordination.  Breast: no skin changes or masses noted. No discharge noted.    Assessment & Plan:  Galactorrhea- normal exam in pt with current use of Seroquel . Breast US  not indicated.  Abnormal discharge-  Leelah was seen today for gynecologic exam.  Diagnoses and all orders for this visit:  Galactorrhea  Hyperprolactinemia (HCC)  Vaginal odor -     Cervicovaginal ancillary only( [place])   F/u prn   Elveria L. Harraway-Smith, M.D., FACOG

## 2023-09-20 LAB — CERVICOVAGINAL ANCILLARY ONLY
Bacterial Vaginitis (gardnerella): NEGATIVE
Comment: NEGATIVE

## 2023-09-23 ENCOUNTER — Encounter: Payer: Self-pay | Admitting: Obstetrics & Gynecology

## 2023-09-24 ENCOUNTER — Other Ambulatory Visit: Payer: No Typology Code available for payment source

## 2023-09-24 ENCOUNTER — Other Ambulatory Visit (HOSPITAL_COMMUNITY): Payer: Self-pay

## 2023-09-25 ENCOUNTER — Other Ambulatory Visit (HOSPITAL_COMMUNITY): Payer: Self-pay

## 2023-09-26 ENCOUNTER — Other Ambulatory Visit (HOSPITAL_COMMUNITY): Payer: Self-pay

## 2023-10-17 ENCOUNTER — Other Ambulatory Visit (HOSPITAL_COMMUNITY): Payer: Self-pay

## 2023-10-17 ENCOUNTER — Other Ambulatory Visit: Payer: Self-pay

## 2023-10-17 MED ORDER — LORAZEPAM 1 MG PO TABS
1.0000 mg | ORAL_TABLET | Freq: Two times a day (BID) | ORAL | 0 refills | Status: AC
  Filled 2023-10-17 (×2): qty 60, 30d supply, fill #0
  Filled 2023-12-31: qty 60, 30d supply, fill #1

## 2023-10-18 ENCOUNTER — Other Ambulatory Visit (HOSPITAL_COMMUNITY): Payer: Self-pay

## 2023-10-18 MED ORDER — GUANFACINE HCL ER 2 MG PO TB24
2.0000 mg | ORAL_TABLET | Freq: Every morning | ORAL | 0 refills | Status: AC
  Filled 2023-10-18: qty 30, 30d supply, fill #0

## 2023-11-04 ENCOUNTER — Ambulatory Visit: Payer: No Typology Code available for payment source | Attending: Cardiology | Admitting: Cardiology

## 2023-11-04 ENCOUNTER — Encounter: Payer: Self-pay | Admitting: Cardiology

## 2023-11-04 ENCOUNTER — Telehealth: Payer: Self-pay

## 2023-11-04 VITALS — BP 104/78 | HR 104 | Ht 64.0 in | Wt 257.0 lb

## 2023-11-04 DIAGNOSIS — R002 Palpitations: Secondary | ICD-10-CM | POA: Diagnosis not present

## 2023-11-04 DIAGNOSIS — I4711 Inappropriate sinus tachycardia, so stated: Secondary | ICD-10-CM

## 2023-11-04 DIAGNOSIS — R0602 Shortness of breath: Secondary | ICD-10-CM

## 2023-11-04 DIAGNOSIS — R059 Cough, unspecified: Secondary | ICD-10-CM

## 2023-11-04 NOTE — Progress Notes (Signed)
 Cardiology Office Note:    Date:  11/04/2023   ID:  JENNILYN ESTEVE, DOB 1995-12-16, MRN [id number]  PCP:  Zola Button, Grayling Congress, DO  Cardiologist:  Thomasene Ripple, DO  Electrophysiologist:  None   Referring MD: Zola Button, Grayling Congress, *   " I am doing much better"  History of Present Illness:    [name] is a 28 y.o. female with a hx of anxiety, bipolar disorder and inappropriate sinus tachycardia who is here today for follow-up visit.  The patient is with her her mother who is her primary caretaker.   Her last visit with me was 12/2021 at that time I decreased her propranolol.  Since that visit she had not followed.  In the interim she and her mother reports  severe cough that has been ongoing for about a year. The cough was so severe that the patient's mother described it as if her "lungs are gonna come out." The patient was initially on propranolol, which her mother suspected to be the cause of the cough. Despite being told that coughing is not a side effect of propranolol, the patient's mother decided to take her off the medication. Following this, the patient's coughing significantly reduced, occurring only about once a day. The patient also experiences episodes of tachycardia during the day, for which she takes additional metoprolol. The patient's mother also noted that the patient tends to overdress, leading to overheating, which seems to trigger the coughing.   Past Medical History:  Diagnosis Date   Acute vaginitis 05/20/2019   ADD (attention deficit disorder) 04/24/2011   ADHD (attention deficit hyperactivity disorder)    Allergy    Anxiety    Benzodiazepine dependence (HCC) 05/12/2020   Bipolar disorder (HCC)    Bipolar disorder, current episode manic severe with psychotic features (HCC)    Bipolar I disorder (HCC) 05/12/2020   Constipation 04/03/2018   Delirium tremens (HCC) 05/20/2019   Depression    Eczema 12/11/13   Genital herpes simplex 10/24/2017   High risk  heterosexual behavior 05/20/2019   MDD (major depressive disorder), recurrent severe, without psychosis (HCC) 02/11/2018   Morbid obesity (HCC) 09/06/2019   Pelvic pain 01/31/2018   Possible pregnancy 09/06/2019   Preventative health care 03/16/2013   Schizo-affective schizophrenia (HCC)    Schizoaffective disorder (HCC) 03/08/2018   Seizure (HCC) 05/20/2019   Severe bipolar I disorder, current or most recent episode depressed (HCC) 03/30/2017   STD exposure 05/13/2015   Syncope 03/03/2017   Vitamin D deficiency 03/10/2019    No past surgical history on file.  Current Medications: Current Meds  Medication Sig   albuterol (VENTOLIN HFA) 108 (90 Base) MCG/ACT inhaler Inhale 2 puffs into the lungs every 6 (six) hours as needed for wheezing or shortness of breath.   azelastine (ASTELIN) 0.1 % nasal spray Place 2 sprays into both nostrils daily as needed for rhinitis or allergies as directed   beclomethasone (QVAR REDIHALER) 40 MCG/ACT inhaler Inhale 2 puffs into the lungs 2 (two) times daily.   benzonatate (TESSALON) 200 MG capsule Take 1 capsule (200 mg total) by mouth 2 (two) times daily as needed for cough.   buPROPion (WELLBUTRIN XL) 150 MG 24 hr tablet Take 1 tablet (150 mg total) by mouth every morning.   Ergocalciferol (VITAMIN D2) 50 MCG (2000 UT) TABS Take by mouth.   fluticasone (FLONASE) 50 MCG/ACT nasal spray Place 2 sprays into both nostrils daily.   guanFACINE (INTUNIV) 2 MG TB24 ER tablet Take  1 tablet (2 mg total) by mouth in the morning.   LORazepam (ATIVAN) 1 MG tablet Take 1 tablet (1 mg total) by mouth 2 (two) times daily.   paliperidone (INVEGA) 1.5 MG 24 hr tablet Take 1 tablet (1.5 mg total) by mouth at bedtime.   Paliperidone Palmitate ER (INVEGA HAFYERA) 1560 MG/5ML SUSY Inject 1,560 mg into the muscle every 6 (six) months.   QUEtiapine (SEROQUEL) 100 MG tablet Take 1 - 3 tablets by mouth at bedtime.   valACYclovir (VALTREX) 1000 MG tablet Take 1 tablet (1,000 mg total) by  mouth daily.     Allergies:   Concerta [methylphenidate], Terbinafine, Trazodone and nefazodone, Clindamycin hcl, Haldol [haloperidol], and Terbinafine hcl   Social History   Socioeconomic History   Marital status: Single    Spouse name: Not on file   Number of children: 0   Years of education: Not on file   Highest education level: Associate degree: academic program  Occupational History   Occupation: Unemployed  Tobacco Use   Smoking status: Never   Smokeless tobacco: Never  Vaping Use   Vaping status: Former   Quit date: 09/01/2017  Substance and Sexual Activity   Alcohol use: Not Currently    Comment: occasionally   Drug use: Not Currently   Sexual activity: Not Currently    Birth control/protection: Pill  Other Topics Concern   Not on file  Social History Narrative   Lives with mom in an apartment on the second floor.  No children.  Currently not working.  Education: college. Left handed   Drinks no caffiene   Lives with mom, sista and niece   Social Drivers of Corporate investment banker Strain: Low Risk  (07/11/2022)   Overall Financial Resource Strain (CARDIA)    Difficulty of Paying Living Expenses: Not hard at all  Food Insecurity: No Food Insecurity (07/11/2022)   Hunger Vital Sign    Worried About Running Out of Food in the Last Year: Never true    Ran Out of Food in the Last Year: Never true  Transportation Needs: No Transportation Needs (07/11/2022)   PRAPARE - Administrator, Civil Service (Medical): No    Lack of Transportation (Non-Medical): No  Physical Activity: Inactive (07/11/2022)   Exercise Vital Sign    Days of Exercise per Week: 0 days    Minutes of Exercise per Session: 0 min  Stress: No Stress Concern Present (07/11/2022)   [employer] of Occupational Health - Occupational Stress Questionnaire    Feeling of Stress : Only a little  Social Connections: Moderately Isolated (07/11/2022)   Social Connection and Isolation  Panel [NHANES]    Frequency of Communication with Friends and Family: More than three times a week    Frequency of Social Gatherings with Friends and Family: More than three times a week    Attends Religious Services: 1 to 4 times per year    Active Member of [employer] or Organizations: No    Attends Engineer, structural: Never    Marital Status: Never married     Family History: The patient's family history includes Asthma in her father and mother; COPD in her maternal grandmother; Depression in her father; Diabetes in her maternal grandfather, paternal grandfather, and paternal grandmother; Heart disease in her father and maternal uncle; Hypertension in her father and maternal grandmother; Leukemia in her maternal uncle; Stroke in her father and paternal grandmother.  ROS:   Review of Systems  Constitution:  Negative for decreased appetite, fever and weight gain.  HENT: Negative for congestion, ear discharge, hoarse voice and sore throat.   Eyes: Negative for discharge, redness, vision loss in right eye and visual halos.  Cardiovascular: Negative for chest pain, dyspnea on exertion, leg swelling, orthopnea and palpitations.  Respiratory: Negative for cough, hemoptysis, shortness of breath and snoring.   Endocrine: Negative for heat intolerance and polyphagia.  Hematologic/Lymphatic: Negative for bleeding problem. Does not bruise/bleed easily.  Skin: Negative for flushing, nail changes, rash and suspicious lesions.  Musculoskeletal: Negative for arthritis, joint pain, muscle cramps, myalgias, neck pain and stiffness.  Gastrointestinal: Negative for abdominal pain, bowel incontinence, diarrhea and excessive appetite.  Genitourinary: Negative for decreased libido, genital sores and incomplete emptying.  Neurological: Negative for brief paralysis, focal weakness, headaches and loss of balance.  Psychiatric/Behavioral: Negative for altered mental status, depression and suicidal ideas.   Allergic/Immunologic: Negative for HIV exposure and persistent infections.    EKGs/Labs/Other Studies Reviewed:    The following studies were reviewed today:   EKG:  The ekg ordered today demonstrates sinus rhythm  Recent Labs: 09/03/2023: ALT 15; BUN 12; Creatinine, Ser 0.74; Hemoglobin 14.0; Platelets 297; Potassium 4.1; Sodium 135; TSH 1.498  Recent Lipid Panel    Component Value Date/Time   CHOL 196 10/06/2020 1128   TRIG 100.0 10/06/2020 1128   HDL 54.30 10/06/2020 1128   CHOLHDL 4 10/06/2020 1128   VLDL 20.0 10/06/2020 1128   LDLCALC 121 (H) 10/06/2020 1128    Physical Exam:    VS:  BP 104/78 (BP Location: Right Arm, Patient Position: Sitting, Cuff Size: Large)   Pulse (!) 104   Ht 5\' 4"  (1.626 m)   Wt 257 lb (116.6 kg)   BMI 44.11 kg/m     Wt Readings from Last 3 Encounters:  11/04/23 257 lb (116.6 kg)  09/18/23 251 lb (113.9 kg)  08/27/23 252 lb 6.4 oz (114.5 kg)     GEN: Well nourished, well developed in no acute distress HEENT: Normal NECK: No JVD; No carotid bruits LYMPHATICS: No lymphadenopathy CARDIAC: S1S2 noted,RRR, no murmurs, rubs, gallops RESPIRATORY:  Clear to auscultation without rales, wheezing or rhonchi  ABDOMEN: Soft, non-tender, non-distended, +bowel sounds, no guarding. EXTREMITIES: No edema, No cyanosis, no clubbing MUSCULOSKELETAL:  No deformity  SKIN: Warm and dry NEUROLOGIC:  Alert and oriented x 3, non-focal PSYCHIATRIC:  Normal affect, good insight  ASSESSMENT:    1. Inappropriate sinus tachycardia (HCC)   2. Palpitations   3. Morbid obesity (HCC)    PLAN:    Inappropriate Sinus Tachycardia Heart rate occasionally reaches 130-140 bpm, currently at 104 bpm. Propranolol discontinued due to cough. Further management depends on pulmonary function test results. - Monitor heart rate and symptoms. - Reassess treatment plan after pulmonary function test results.  Chronic Cough Chronic cough reduced after discontinuing  propranolol. Differential diagnosis includes asthma or restrictive lung disease. Pulmonary function test necessary to determine presence of asthma or restrictive lung disease. - Order pulmonary function test to assess for asthma or restrictive lung disease. - Keep propranolol discontinued until pulmonary function test results are available. - Consider referral to pulmonology if pulmonary function test is abnormal.  Anxiety Anxiety is manageable. She consumes approximately 12 ounces of coffee daily as a coping mechanism. - Continue current management of anxiety. The patient understands the need to lose weight with diet and exercise. We have discussed specific strategies for this.  The patient is in agreement with the above plan. The patient left  the office in stable condition.  The patient will follow up in 1 year.   Medication Adjustments/Labs and Tests Ordered: Current medicines are reviewed at length with the patient today.  Concerns regarding medicines are outlined above.  Orders Placed This Encounter  Procedures   EKG 12-Lead   No orders of the defined types were placed in this encounter.   There are no Patient Instructions on file for this visit.   Adopting a Healthy Lifestyle.  Know what a healthy weight is for you (roughly BMI <25) and aim to maintain this   Aim for 7+ servings of fruits and vegetables daily   65-80+ fluid ounces of water or unsweet tea for healthy kidneys   Limit to max 1 drink of alcohol per day; avoid smoking/tobacco   Limit animal fats in diet for cholesterol and heart health - choose grass fed whenever available   Avoid highly processed foods, and foods high in saturated/trans fats   Aim for low stress - take time to unwind and care for your mental health   Aim for 150 min of moderate intensity exercise weekly for heart health, and weights twice weekly for bone health   Aim for 7-9 hours of sleep daily   When it comes to diets, agreement about  the perfect plan isnt easy to find, even among the experts. Experts at the [facility] [employer] developed an idea known as the Healthy Eating Plate. Just imagine a plate divided into logical, healthy portions.   The emphasis is on diet quality:   Load up on vegetables and fruits - one-half of your plate: Aim for color and variety, and remember that potatoes dont count.   Go for whole grains - one-quarter of your plate: Whole wheat, barley, wheat berries, quinoa, oats, brown rice, and foods made with them. If you want pasta, go with whole wheat pasta.   Protein power - one-quarter of your plate: Fish, chicken, beans, and nuts are all healthy, versatile protein sources. Limit red meat.   The diet, however, does go beyond the plate, offering a few other suggestions.   Use healthy plant oils, such as olive, canola, soy, corn, sunflower and peanut. Check the labels, and avoid partially hydrogenated oil, which have unhealthy trans fats.   If youre thirsty, drink water. Coffee and tea are good in moderation, but skip sugary drinks and limit milk and dairy products to one or two daily servings.   The type of carbohydrate in the diet is more important than the amount. Some sources of carbohydrates, such as vegetables, fruits, whole grains, and beans-are healthier than others.   Finally, stay active  Signed, Thomasene Ripple, DO  11/04/2023 9:51 AM    [facility]

## 2023-11-04 NOTE — Telephone Encounter (Signed)
 Left message with pt's info for PFT.

## 2023-11-04 NOTE — Patient Instructions (Signed)
 Medication Instructions:  Your physician recommends that you continue on your current medications as directed. Please refer to the Current Medication list given to you today.  *If you need a refill on your cardiac medications before your next appointment, please call your pharmacy*  Testing/Procedures: Your physician has recommended that you have a pulmonary function test. Pulmonary Function Tests are a group of tests that measure how well air moves in and out of your lungs.  Pulmonary Function Tests Pulmonary function tests (PFTs) are breathing tests that are used to: Measure how well your lungs work. Find out what is causing your lung problems. Find the best treatment for you. You may have PFTs: If you have a condition that affects your lungs, such as asthma or chronic obstructive pulmonary disease (COPD). To watch for changes in your lung function over time if you have a long-term (chronic) lung disease. If you are an IT trainer. PFTs check the effects of being exposed to chemicals over a long period of time. To check lung function: Before having surgery or other procedures. If you smoke. To check if prescribed medicines or treatments are helping your lungs. Tell a health care provider about: Any allergies you have. All medicines you are taking, including inhaler or nebulizer medicines, vitamins, herbs, eye drops, creams, and over-the-counter medicines. Any bleeding problems you have. Any surgeries you have had, especially recent surgery of the eye, abdomen, or chest. These can make PFTs difficult or unsafe. Any medical conditions you have, including chest pain or heart problems, tuberculosis, or respiratory infections such as pneumonia, a cold, or the flu. Any fear of being in closed spaces (claustrophobia). Some of your tests may be in a closed space. What are the risks? Your health care provider will talk with you about risks. These may include: Feeling light-headed  due to fast, deep breathing known as overbreathing or hyperventilation. An asthma attack from deep breathing. What happens before the test? Take over-the-counter and prescription medicines only as told by your health care provider. If you take inhaler or nebulizer medicines, ask your health care provider which medicines you should take on the day of your testing. Some inhaler medicines may interfere with PFTs if they are taken shortly before the tests. Follow instructions from your health care provider about what you may eat and drink. These may include: Avoiding eating large meals. Avoiding using caffeine before the testing. Not drinkingalcohol for up to 4 hours before the test. Do not use any products that contain nicotine or tobacco for up to 4 hours before your test. These products include cigarettes, chewing tobacco, and vaping devices, such as e-cigarettes. These can affect your test results. If you need help quitting, ask your health care provider. Wear comfortable clothing that will not get in the way of your breathing. Avoid exercise that takes a lot of effort (strenuous exercise) for at least 30 minutes before the test. What happens during the test?  You will be given: A soft noseclip to wear. This allows all of your breaths to go through your mouth instead of your nose. A germ-free (sterile) mouthpiece. It will be attached to a spirometer machine that measures your breathing. You will be asked to do breathing exercises. The exercises will be done by breathing in (inhaling) and breathing out (exhaling). You may have to repeat the exercises many times before the testing is complete. You will need to follow instructions exactly as told to get accurate results. Make sure to blow as hard and  as fast as you can when you are told to do so. You may be given a medicine called a bronchodilator. This makes the small air passages in your lungs larger so you can breathe easier. The tests will be  repeated after the medicine takes effect. You will be watched for any problems, such as feeling faint or dizzy, or having trouble breathing. The procedure may vary among health care providers and hospitals. What can I expect after the test? Your results will be compared with the expected lung function of someone with healthy lungs who is similar to you in several ways. These ways include age, sex, height, weight, and race or ethnicity. This is done to show how your lung function compares with normal lung function (percent predicted). The percent predicted helps your health care provider know if your lung function is normal or not. If you have had PFTs done before, your health care provider will compare your current results with past results. This shows if your lung function is better, worse, or the same as before. It is up to you to get the results of your procedure. Ask your health care provider, or the department that is doing the procedure, when your results will be ready. After you get your results, talk with your health care provider about treatment options, if necessary. This information is not intended to replace advice given to you by your health care provider. Make sure you discuss any questions you have with your health care provider. Document Revised: 02/19/2022 Document Reviewed: 02/19/2022 Elsevier Patient Education  2024 Elsevier Inc.  Follow-Up: At [facility], you and your health needs are our priority.  As part of our continuing mission to provide you with exceptional heart care, we have created designated Provider Care Teams.  These Care Teams include your primary Cardiologist (physician) and Advanced Practice Providers (APPs -  Physician Assistants and Nurse Practitioners) who all work together to provide you with the care you need, when you need it.   Your next appointment:   1 year(s)  Provider:   Thomasene Ripple, DO

## 2023-11-09 ENCOUNTER — Other Ambulatory Visit (HOSPITAL_COMMUNITY): Payer: Self-pay

## 2023-11-11 ENCOUNTER — Other Ambulatory Visit: Payer: Self-pay

## 2023-11-11 ENCOUNTER — Other Ambulatory Visit (HOSPITAL_COMMUNITY): Payer: Self-pay

## 2023-11-11 MED ORDER — GUANFACINE HCL ER 3 MG PO TB24
3.0000 mg | ORAL_TABLET | Freq: Every day | ORAL | 1 refills | Status: DC
  Filled 2023-11-11: qty 30, 30d supply, fill #0
  Filled 2023-12-10: qty 30, 30d supply, fill #1

## 2023-11-14 ENCOUNTER — Other Ambulatory Visit: Payer: Self-pay

## 2023-11-14 ENCOUNTER — Other Ambulatory Visit (HOSPITAL_COMMUNITY): Payer: Self-pay

## 2023-11-14 MED ORDER — PALIPERIDONE ER 1.5 MG PO TB24
1.5000 mg | ORAL_TABLET | Freq: Every day | ORAL | 2 refills | Status: AC
  Filled 2023-11-14: qty 30, 30d supply, fill #0

## 2023-11-15 ENCOUNTER — Other Ambulatory Visit (HOSPITAL_COMMUNITY): Payer: Self-pay

## 2023-11-18 ENCOUNTER — Ambulatory Visit (HOSPITAL_COMMUNITY)
Admission: RE | Admit: 2023-11-18 | Discharge: 2023-11-18 | Disposition: A | Discharge status: Home / Self Care | Source: Ambulatory Visit | Attending: Cardiology | Admitting: Cardiology

## 2023-11-18 DIAGNOSIS — R059 Cough, unspecified: Secondary | ICD-10-CM | POA: Diagnosis present

## 2023-11-18 DIAGNOSIS — R0602 Shortness of breath: Secondary | ICD-10-CM | POA: Diagnosis present

## 2023-11-18 LAB — PULMONARY FUNCTION TEST
DL/VA % pred: 110 %
DL/VA: 5.11 ml/min/mmHg/L
DLCO unc % pred: 61 %
DLCO unc: 13.74 ml/min/mmHg
FEF 25-75 Post: 3.07 L/s
FEF 25-75 Pre: 2.77 L/s
FEF2575-%Change-Post: 10 %
FEF2575-%Pred-Post: 87 %
FEF2575-%Pred-Pre: 79 %
FEV1-%Change-Post: 0 %
FEV1-%Pred-Post: 63 %
FEV1-%Pred-Pre: 63 %
FEV1-Post: 2.04 L
FEV1-Pre: 2.04 L
FEV1FVC-%Change-Post: 1 %
FEV1FVC-%Pred-Pre: 107 %
FEV6-%Change-Post: 0 %
FEV6-%Pred-Post: 59 %
FEV6-%Pred-Pre: 59 %
FEV6-Post: 2.22 L
FEV6-Pre: 2.23 L
FEV6FVC-%Pred-Post: 100 %
FEV6FVC-%Pred-Pre: 100 %
FVC-%Change-Post: 0 %
FVC-%Pred-Post: 58 %
FVC-%Pred-Pre: 59 %
FVC-Post: 2.22 L
FVC-Pre: 2.24 L
Post FEV1/FVC ratio: 92 %
Post FEV6/FVC ratio: 100 %
Pre FEV1/FVC ratio: 91 %
Pre FEV6/FVC Ratio: 100 %
RV % pred: 192 %
RV: 2.63 L
TLC % pred: 100 %
TLC: 5.06 L

## 2023-11-18 MED ORDER — ALBUTEROL SULFATE (2.5 MG/3ML) 0.083% IN NEBU
2.5000 mg | INHALATION_SOLUTION | Freq: Once | RESPIRATORY_TRACT | Status: AC
  Administered 2023-11-18: 2.5 mg via RESPIRATORY_TRACT

## 2023-11-19 ENCOUNTER — Encounter: Payer: Self-pay | Admitting: Cardiology

## 2023-11-25 ENCOUNTER — Other Ambulatory Visit (HOSPITAL_COMMUNITY): Payer: Self-pay

## 2023-11-27 ENCOUNTER — Other Ambulatory Visit (HOSPITAL_COMMUNITY): Payer: Self-pay

## 2023-11-27 ENCOUNTER — Other Ambulatory Visit: Payer: Self-pay

## 2023-11-27 DIAGNOSIS — J45909 Unspecified asthma, uncomplicated: Secondary | ICD-10-CM

## 2023-11-27 NOTE — Progress Notes (Signed)
Referral made to pulmonary

## 2023-12-06 ENCOUNTER — Encounter (HOSPITAL_BASED_OUTPATIENT_CLINIC_OR_DEPARTMENT_OTHER): Payer: Self-pay

## 2023-12-31 ENCOUNTER — Other Ambulatory Visit (HOSPITAL_COMMUNITY): Payer: Self-pay

## 2024-01-01 ENCOUNTER — Other Ambulatory Visit (HOSPITAL_COMMUNITY): Payer: Self-pay

## 2024-01-01 ENCOUNTER — Other Ambulatory Visit: Payer: Self-pay

## 2024-01-01 MED ORDER — GUANFACINE HCL ER 3 MG PO TB24
3.0000 mg | ORAL_TABLET | Freq: Every day | ORAL | 0 refills | Status: DC
Start: 2024-01-10 — End: 2024-01-02

## 2024-01-01 MED ORDER — GUANFACINE HCL ER 4 MG PO TB24
4.0000 mg | ORAL_TABLET | Freq: Every morning | ORAL | 0 refills | Status: AC
  Filled 2024-01-01: qty 30, 30d supply, fill #0

## 2024-01-02 ENCOUNTER — Other Ambulatory Visit (HOSPITAL_COMMUNITY): Payer: Self-pay

## 2024-01-13 ENCOUNTER — Telehealth: Payer: Self-pay | Admitting: Cardiology

## 2024-01-13 NOTE — Telephone Encounter (Signed)
 Mother Oralee Billow) called to follow-up on results of patient's Pulmonary Function Test and advice on next steps.

## 2024-01-13 NOTE — Telephone Encounter (Signed)
 Pts mother given her PFT results and that it looks like Pulmonary had tried to reach out to them,.. I gave her their number to call to make the appt.

## 2024-01-20 ENCOUNTER — Other Ambulatory Visit: Payer: Self-pay

## 2024-01-20 MED ORDER — PALIPERIDONE PALMITATE ER 1560 MG/5ML IM SUSY
PREFILLED_SYRINGE | INTRAMUSCULAR | 0 refills | Status: DC
  Filled 2024-01-20: qty 5, fill #0
  Filled 2024-01-21: qty 5, 90d supply, fill #0

## 2024-01-21 ENCOUNTER — Other Ambulatory Visit: Payer: Self-pay

## 2024-01-21 ENCOUNTER — Other Ambulatory Visit (HOSPITAL_COMMUNITY): Payer: Self-pay

## 2024-01-21 NOTE — Progress Notes (Signed)
 Specialty Pharmacy Refill Coordination Note  [name] is a 28 y.o. female assessed today regarding refills of clinic administered specialty medication(s) Paliperidone  Palmitate (INVEGA  HAFYERA)   Clinic requested Courier to Provider Office   Delivery date: 01/28/24   Verified address: Dr. Deborra Falter -[id number]   Medication will be filled on 06.16.25.

## 2024-01-23 ENCOUNTER — Encounter: Payer: Self-pay | Admitting: Family Medicine

## 2024-01-23 ENCOUNTER — Ambulatory Visit (INDEPENDENT_AMBULATORY_CARE_PROVIDER_SITE_OTHER): Admitting: Family Medicine

## 2024-01-23 ENCOUNTER — Other Ambulatory Visit (HOSPITAL_COMMUNITY)
Admission: RE | Admit: 2024-01-23 | Discharge: 2024-01-23 | Disposition: A | Discharge status: Home / Self Care | Source: Ambulatory Visit | Attending: Family Medicine | Admitting: Family Medicine

## 2024-01-23 ENCOUNTER — Other Ambulatory Visit (HOSPITAL_COMMUNITY): Payer: Self-pay

## 2024-01-23 VITALS — BP 120/78 | HR 117 | Temp 98.1°F | Resp 18 | Ht 64.0 in | Wt 261.8 lb

## 2024-01-23 DIAGNOSIS — N760 Acute vaginitis: Secondary | ICD-10-CM

## 2024-01-23 DIAGNOSIS — N898 Other specified noninflammatory disorders of vagina: Secondary | ICD-10-CM

## 2024-01-23 DIAGNOSIS — R5383 Other fatigue: Secondary | ICD-10-CM

## 2024-01-23 MED ORDER — FLUCONAZOLE 150 MG PO TABS
150.0000 mg | ORAL_TABLET | Freq: Every day | ORAL | 0 refills | Status: DC
  Filled 2024-01-23: qty 2, 2d supply, fill #0

## 2024-01-23 NOTE — Assessment & Plan Note (Signed)
 Self swab Ua  diflucan 

## 2024-01-23 NOTE — Progress Notes (Signed)
 Discussed the use of AI scribe software for clinical note transcription with the patient, who gave verbal consent to proceed.  History of Present Illness     Established Patient Office Visit  Subjective   Patient ID: [name], female    DOB: 07/16/1996  Age: 28 y.o. MRN: [id number]  Chief Complaint  Patient presents with   Fatigue    Sxs started x3-4 weeks ago, pt states having fatigue and weakness, pt states she was in the house for a month.     HPI Discussed the use of AI scribe software for clinical note transcription with the patient, who gave verbal consent to proceed.  History of Present Illness   [name] is a 28 year old female who presents with weakness, dizziness, and fatigue. She is accompanied by her aunt.  She has been experiencing weakness and dizziness since the past weekend, particularly on Saturday and Sunday, accompanied by nausea and spending most of her time in bed.  She is currently taking lorazepam  1 mg three times a day, initially prescribed as needed, which she believes contributes to her fatigue and prolonged sleep. She also takes Guanfacine , increased to 4 mg for focus, with no reported side effects.  Significant fatigue has persisted since March, leading to muscle pain in her lower body, including legs, thighs, and ankles, attributed to prolonged inactivity.  She takes vitamin D  2000 units once a week but has forgotten to take it for a month. She has been on vitamin D  supplementation for several years, initially prescribed after an ankle fracture.  She has a history of yeast infections and recently used Monistat for three days without relief. She previously used her mother's Vesicare for a spot resembling a blackhead, which resolved with warm compresses.  No recent fever.      Patient Active Problem List   Diagnosis Date Noted   Other fatigue 01/23/2024   Bilateral chronic serous otitis media 07/18/2023   Chronic cough 05/30/2023    Abdominal pressure 01/10/2023   Abnormal urine 01/10/2023   Vaginal discharge 11/24/2021   Gastroenteritis 06/13/2021   Bronchitis 04/18/2021   Seizure disorder (HCC) 10/25/2020   Thrush 10/25/2020   Amenorrhea 10/06/2020   Ear pain, left 10/06/2020   Schizo-affective schizophrenia (HCC)    Allergy    ADHD (attention deficit hyperactivity disorder)    Depression    Bipolar I disorder (HCC) 05/12/2020   Benzodiazepine dependence (HCC) 05/12/2020   Morbid obesity (HCC) 09/06/2019   Possible pregnancy 09/06/2019   Delirium tremens (HCC) 05/20/2019   Seizure (HCC) 05/20/2019   Acute vaginitis 05/20/2019   High risk sexual behavior 05/20/2019   Vitamin D  deficiency 03/10/2019   Constipation 04/03/2018   Schizoaffective disorder (HCC) 03/08/2018   Bipolar disorder, current episode manic severe with psychotic features (HCC)    MDD (major depressive disorder), recurrent severe, without psychosis (HCC) 02/11/2018   Pelvic pain 01/31/2018   Genital herpes simplex 10/24/2017   Anxiety 08/30/2017   Severe bipolar I disorder, current or most recent episode depressed (HCC) 03/30/2017   Syncope 03/03/2017   STD exposure 05/13/2015   Eczema 12/11/2013   Encounter for contraceptive management 03/16/2013   Bipolar disorder (HCC) 12/30/2012   ADD (attention deficit disorder) 04/24/2011   Past Medical History:  Diagnosis Date   Acute vaginitis 05/20/2019   ADD (attention deficit disorder) 04/24/2011   ADHD (attention deficit hyperactivity disorder)    Allergy    Anxiety    Benzodiazepine dependence (HCC) 05/12/2020  Bipolar disorder (HCC)    Bipolar disorder, current episode manic severe with psychotic features (HCC)    Bipolar I disorder (HCC) 05/12/2020   Constipation 04/03/2018   Delirium tremens (HCC) 05/20/2019   Depression    Eczema 12/11/13   Genital herpes simplex 10/24/2017   High risk heterosexual behavior 05/20/2019   MDD (major depressive disorder), recurrent severe, without  psychosis (HCC) 02/11/2018   Morbid obesity (HCC) 09/06/2019   Pelvic pain 01/31/2018   Possible pregnancy 09/06/2019   Preventative health care 03/16/2013   Schizo-affective schizophrenia (HCC)    Schizoaffective disorder (HCC) 03/08/2018   Seizure (HCC) 05/20/2019   Severe bipolar I disorder, current or most recent episode depressed (HCC) 03/30/2017   STD exposure 05/13/2015   Syncope 03/03/2017   Vitamin D  deficiency 03/10/2019   No past surgical history on file. Social History   Tobacco Use   Smoking status: Never   Smokeless tobacco: Never  Vaping Use   Vaping status: Former   Quit date: 09/01/2017  Substance Use Topics   Alcohol use: Not Currently    Comment: occasionally   Drug use: Not Currently   Social History   Socioeconomic History   Marital status: Single    Spouse name: Not on file   Number of children: 0   Years of education: Not on file   Highest education level: Associate degree: academic program  Occupational History   Occupation: Unemployed  Tobacco Use   Smoking status: Never   Smokeless tobacco: Never  Vaping Use   Vaping status: Former   Quit date: 09/01/2017  Substance and Sexual Activity   Alcohol use: Not Currently    Comment: occasionally   Drug use: Not Currently   Sexual activity: Not Currently    Birth control/protection: Pill  Other Topics Concern   Not on file  Social History Narrative   Lives with mom in an apartment on the second floor.  No children.  Currently not working.  Education: college. Left handed   Drinks no caffiene   Lives with mom, sista and niece   Social Drivers of Corporate investment banker Strain: Low Risk  (07/11/2022)   Overall Financial Resource Strain (CARDIA)    Difficulty of Paying Living Expenses: Not hard at all  Food Insecurity: No Food Insecurity (07/11/2022)   Hunger Vital Sign    Worried About Running Out of Food in the Last Year: Never true    Ran Out of Food in the Last Year: Never true  Transportation  Needs: No Transportation Needs (07/11/2022)   PRAPARE - Administrator, Civil Service (Medical): No    Lack of Transportation (Non-Medical): No  Physical Activity: Inactive (07/11/2022)   Exercise Vital Sign    Days of Exercise per Week: 0 days    Minutes of Exercise per Session: 0 min  Stress: No Stress Concern Present (07/11/2022)   [employer] of Occupational Health - Occupational Stress Questionnaire    Feeling of Stress : Only a little  Social Connections: Moderately Isolated (07/11/2022)   Social Connection and Isolation Panel    Frequency of Communication with Friends and Family: More than three times a week    Frequency of Social Gatherings with Friends and Family: More than three times a week    Attends Religious Services: 1 to 4 times per year    Active Member of [employer] or Organizations: No    Attends Banker Meetings: Never    Marital Status: Never married  Intimate Partner Violence: Not At Risk (07/11/2022)   Humiliation, Afraid, Rape, and Kick questionnaire    Fear of Current or Ex-Partner: No    Emotionally Abused: No    Physically Abused: No    Sexually Abused: No   Family Status  Relation Name Status   Mother  Alive   Father  Alive   Sister  Alive   Mat Uncle  (Not Specified)   Mat Uncle  (Not Specified)   MGF  (Not Specified)   MGM  (Not Specified)   PGF  (Not Specified)   PGM  (Not Specified)  No partnership data on file   Family History  Problem Relation Age of Onset   Asthma Mother    Depression Father    Stroke Father    Heart disease Father    Asthma Father    Hypertension Father        pulmonary hypertension   Heart disease Maternal Uncle    Leukemia Maternal Uncle    Diabetes Maternal Grandfather    Hypertension Maternal Grandmother    COPD Maternal Grandmother    Diabetes Paternal Grandfather    Stroke Paternal Grandmother    Diabetes Paternal Grandmother    Allergies  Allergen Reactions   Concerta  [Methylphenidate] Other (See Comments)    hallucinations   Terbinafine Other (See Comments)   Trazodone  And Nefazodone Other (See Comments)    Pt stated she has nightmares   Clindamycin Hcl Rash   Haldol  [Haloperidol ] Anxiety    Unable to seat still   Terbinafine Hcl Nausea And Vomiting    Review of Systems  Constitutional:  Positive for malaise/fatigue. Negative for fever.  HENT:  Negative for congestion.   Eyes:  Negative for blurred vision.  Respiratory:  Negative for shortness of breath.   Cardiovascular:  Negative for chest pain, palpitations and leg swelling.  Gastrointestinal:  Negative for abdominal pain, blood in stool and nausea.  Genitourinary:  Negative for dysuria and frequency.  Musculoskeletal:  Negative for falls.  Skin:  Negative for rash.  Neurological:  Negative for dizziness, loss of consciousness and headaches.  Endo/Heme/Allergies:  Negative for environmental allergies.  Psychiatric/Behavioral:  Negative for depression. The patient is not nervous/anxious.       Objective:     BP 120/78 (BP Location: Left Arm, Patient Position: Sitting, Cuff Size: Large)   Pulse (!) 117   Temp 98.1 F (36.7 C) (Oral)   Resp 18   Ht 5' 4 (1.626 m)   Wt 261 lb 12.8 oz (118.8 kg)   SpO2 98%   BMI 44.94 kg/m  BP Readings from Last 3 Encounters:  01/23/24 120/78  11/04/23 104/78  09/18/23 108/87   Wt Readings from Last 3 Encounters:  01/23/24 261 lb 12.8 oz (118.8 kg)  11/04/23 257 lb (116.6 kg)  09/18/23 251 lb (113.9 kg)   SpO2 Readings from Last 3 Encounters:  01/23/24 98%  09/03/23 94%  08/27/23 98%      Physical Exam Vitals and nursing note reviewed.  Constitutional:      General: She is not in acute distress.    Appearance: Normal appearance. She is well-developed.  HENT:     Head: Normocephalic and atraumatic.   Eyes:     General: No scleral icterus.       Right eye: No discharge.        Left eye: No discharge.    Cardiovascular:     Rate  and Rhythm: Normal rate and regular  rhythm.     Heart sounds: No murmur heard. Pulmonary:     Effort: Pulmonary effort is normal. No respiratory distress.     Breath sounds: Normal breath sounds.   Musculoskeletal:        General: Normal range of motion.     Cervical back: Normal range of motion and neck supple.     Right lower leg: No edema.     Left lower leg: No edema.   Skin:    General: Skin is warm and dry.   Neurological:     Mental Status: She is alert and oriented to person, place, and time.   Psychiatric:        Mood and Affect: Mood normal.        Behavior: Behavior normal.        Thought Content: Thought content normal.        Judgment: Judgment normal.      Results for orders placed or performed in visit on 01/23/24  Magnesium   Result Value Ref Range   Magnesium  2.0 1.5 - 2.5 mg/dL  VITAMIN D  25 Hydroxy (Vit-D Deficiency, Fractures)  Result Value Ref Range   Vit D, 25-Hydroxy 34 30 - 100 ng/mL  TSH  Result Value Ref Range   TSH 0.85 mIU/L  Comprehensive metabolic panel with GFR  Result Value Ref Range   Glucose, Bld 98 65 - 99 mg/dL   BUN 12 7 - 25 mg/dL   Creat 1.61 0.96 - 0.45 mg/dL   eGFR 99 > OR = 60 WU/JWJ/1.91Y7   BUN/Creatinine Ratio SEE NOTE: 6 - 22 (calc)   Sodium 135 135 - 146 mmol/L   Potassium 4.2 3.5 - 5.3 mmol/L   Chloride 103 98 - 110 mmol/L   CO2 23 20 - 32 mmol/L   Calcium 9.0 8.6 - 10.2 mg/dL   Total Protein 6.7 6.1 - 8.1 g/dL   Albumin 4.0 3.6 - 5.1 g/dL   Globulin 2.7 1.9 - 3.7 g/dL (calc)   AG Ratio 1.5 1.0 - 2.5 (calc)   Total Bilirubin 0.3 0.2 - 1.2 mg/dL   Alkaline phosphatase (APISO) 81 31 - 125 U/L   AST 12 10 - 30 U/L   ALT 13 6 - 29 U/L  CBC with Differential/Platelet  Result Value Ref Range   WBC 7.7 3.8 - 10.8 Thousand/uL   RBC 4.49 3.80 - 5.10 Million/uL   Hemoglobin 13.1 11.7 - 15.5 g/dL   HCT 82.9 56.2 - 13.0 %   MCV 90.4 80.0 - 100.0 fL   MCH 29.2 27.0 - 33.0 pg   MCHC 32.3 32.0 - 36.0 g/dL   RDW 86.5  78.4 - 69.6 %   Platelets 327 140 - 400 Thousand/uL   MPV 9.7 7.5 - 12.5 fL   Neutro Abs 3,573 1,500 - 7,800 cells/uL   Absolute Lymphocytes 3,149 850 - 3,900 cells/uL   Absolute Monocytes 701 200 - 950 cells/uL   Eosinophils Absolute 223 15 - 500 cells/uL   Basophils Absolute 54 0 - 200 cells/uL   Neutrophils Relative % 46.4 %   Total Lymphocyte 40.9 %   Monocytes Relative 9.1 %   Eosinophils Relative 2.9 %   Basophils Relative 0.7 %    Last CBC Lab Results  Component Value Date   WBC 7.7 01/23/2024   HGB 13.1 01/23/2024   HCT 40.6 01/23/2024   MCV 90.4 01/23/2024   MCH 29.2 01/23/2024   RDW 13.7 01/23/2024   PLT 327 01/23/2024  Last metabolic panel Lab Results  Component Value Date   GLUCOSE 98 01/23/2024   NA 135 01/23/2024   K 4.2 01/23/2024   CL 103 01/23/2024   CO2 23 01/23/2024   BUN 12 01/23/2024   CREATININE 0.82 01/23/2024   GFRNONAA >60 09/03/2023   CALCIUM 9.0 01/23/2024   PROT 6.7 01/23/2024   ALBUMIN 3.7 09/03/2023   BILITOT 0.3 01/23/2024   ALKPHOS 81 09/03/2023   AST 12 01/23/2024   ALT 13 01/23/2024   ANIONGAP 10 09/03/2023   Last lipids Lab Results  Component Value Date   CHOL 196 10/06/2020   HDL 54.30 10/06/2020   LDLCALC 121 (H) 10/06/2020   TRIG 100.0 10/06/2020   CHOLHDL 4 10/06/2020   Last hemoglobin A1c Lab Results  Component Value Date   HGBA1C 5.0 07/02/2020   Last thyroid  functions Lab Results  Component Value Date   TSH 0.85 01/23/2024   T4TOTAL 8.1 06/16/2018   Last vitamin D  Lab Results  Component Value Date   VD25OH 34 01/23/2024   Last vitamin B12 and Folate Lab Results  Component Value Date   VITAMINB12 912 (H) 05/19/2019      The ASCVD Risk score (Arnett DK, et al., 2019) failed to calculate for the following reasons:   The 2019 ASCVD risk score is only valid for ages 71 to 33    Assessment & Plan:   Problem List Items Addressed This Visit       Unprioritized   Acute vaginitis   Relevant  Orders   Cervicovaginal ancillary only( [facility])   Vaginal discharge   Self swab Ua  diflucan       Relevant Orders   Cervicovaginal ancillary only( [facility])   Other fatigue - Primary   Relevant Orders   Vitamin B12   POCT Urinalysis Dipstick (Automated)   CBC with Differential/Platelet (Completed)   Comprehensive metabolic panel with GFR (Completed)   Epstein-Barr virus VCA antibody panel   TSH (Completed)   VITAMIN D  25 Hydroxy (Vit-D Deficiency, Fractures) (Completed)   Magnesium  (Completed)  Assessment and Plan    Possible Candidiasis   Symptoms suggest candidiasis, with a spot treated with Monistat for three days without improvement. Previous over-the-counter treatments have been ineffective. Perform a swab test to confirm candidiasis. If confirmed, consider prescribing fluconazole .  Fatigue and Weakness   She experiences significant fatigue and weakness since March, with excessive sleeping, weakness, and nausea. Current medications, including lorazepam , may contribute to these symptoms. Extended bed rest may also contribute to muscle weakness and fatigue. Evaluate medication impact and potential underlying causes. Order lab tests to evaluate underlying causes, including vitamin D  and B12 levels. Review current medications with Dr. Gavin Kast to assess potential side effects contributing to fatigue.  Vitamin D  Deficiency   She is on vitamin D  supplementation, but dosage adequacy is uncertain. Inconsistent supplement intake may contribute to fatigue and weakness. Check vitamin D  levels to determine if a higher dose is necessary. If low, prescribe a higher dose of vitamin D  supplementation and continue daily over-the-counter vitamin D .  Follow-up Coordination   She is scheduled to see Dr. Gavin Kast next week for medication review and paliperidone  injection. Coordination with Dr. Gavin Kast is necessary to ensure the injection is ordered and administered. Follow up with Dr. Gavin Kast for  medication review and paliperidone  injection. Contact Dr. Gavin Kast to ensure the paliperidone  injection is ordered.        Return if symptoms worsen or fail to improve.    Keimani Laufer  R Crecencio Dodge, DO

## 2024-01-23 NOTE — Patient Instructions (Signed)
 Fatigue If you have fatigue, you feel tired all the time and have a lack of energy or a lack of motivation. Fatigue may make it difficult to start or complete tasks because of exhaustion. Occasional or mild fatigue is often a normal response to activity or life. However, long-term (chronic) or extreme fatigue may be a symptom of a medical condition such as: Depression. Not having enough red blood cells or hemoglobin in the blood (anemia). A problem with a small gland located in the lower front part of the neck (thyroid disorder). Rheumatologic conditions. These are problems related to the body's defense system (immune system). Infections, especially certain viral infections. Fatigue can also lead to negative health outcomes over time. Follow these instructions at home: Medicines Take over-the-counter and prescription medicines only as told by your health care provider. Take a multivitamin if told by your health care provider. Do not use herbal or dietary supplements unless they are approved by your health care provider. Eating and drinking  Avoid heavy meals in the evening. Eat a well-balanced diet, which includes lean proteins, whole grains, plenty of fruits and vegetables, and low-fat dairy products. Avoid eating or drinking too many products with caffeine in them. Avoid alcohol. Drink enough fluid to keep your urine pale yellow. Activity  Exercise regularly, as told by your health care provider. Use or practice techniques to help you relax, such as yoga, tai chi, meditation, or massage therapy. Lifestyle Change situations that cause you stress. Try to keep your work and personal schedules in balance. Do not use recreational or illegal drugs. General instructions Monitor your fatigue for any changes. Go to bed and get up at the same time every day. Avoid fatigue by pacing yourself during the day and getting enough sleep at night. Maintain a healthy weight. Contact a health care  provider if: Your fatigue does not get better. You have a fever. You suddenly lose or gain weight. You have headaches. You have trouble falling asleep or sleeping through the night. You feel angry, guilty, anxious, or sad. You have swelling in your legs or another part of your body. Get help right away if: You feel confused, feel like you might faint, or faint. Your vision is blurry or you have a severe headache. You have severe pain in your abdomen, your back, or the area between your waist and hips (pelvis). You have chest pain, shortness of breath, or an irregular or fast heartbeat. You are unable to urinate, or you urinate less than normal. You have abnormal bleeding from the rectum, nose, lungs, nipples, or, if you are female, the vagina. You vomit blood. You have thoughts about hurting yourself or others. These symptoms may be an emergency. Get help right away. Call 911. Do not wait to see if the symptoms will go away. Do not drive yourself to the hospital. Get help right away if you feel like you may hurt yourself or others, or have thoughts about taking your own life. Go to your nearest emergency room or: Call 911. Call the National Suicide Prevention Lifeline at (262)721-8699 or 988. This is open 24 hours a day. Text the Crisis Text Line at 8450584327. Summary If you have fatigue, you feel tired all the time and have a lack of energy or a lack of motivation. Fatigue may make it difficult to start or complete tasks because of exhaustion. Long-term (chronic) or extreme fatigue may be a symptom of a medical condition. Exercise regularly, as told by your health care provider.  Change situations that cause you stress. Try to keep your work and personal schedules in balance. This information is not intended to replace advice given to you by your health care provider. Make sure you discuss any questions you have with your health care provider. Document Revised: 05/22/2021 Document  Reviewed: 05/22/2021 Elsevier Patient Education  2024 [employer].

## 2024-01-24 ENCOUNTER — Ambulatory Visit: Payer: Self-pay | Admitting: Family Medicine

## 2024-01-24 ENCOUNTER — Other Ambulatory Visit: Payer: Self-pay

## 2024-01-24 ENCOUNTER — Other Ambulatory Visit (HOSPITAL_COMMUNITY): Payer: Self-pay

## 2024-01-24 DIAGNOSIS — N760 Acute vaginitis: Secondary | ICD-10-CM

## 2024-01-24 DIAGNOSIS — N898 Other specified noninflammatory disorders of vagina: Secondary | ICD-10-CM

## 2024-01-24 DIAGNOSIS — E559 Vitamin D deficiency, unspecified: Secondary | ICD-10-CM

## 2024-01-24 LAB — EPSTEIN-BARR VIRUS VCA ANTIBODY PANEL
EBV NA IgG: >600 U/mL — ABNORMAL HIGH
EBV VCA IgG: >750 U/mL — ABNORMAL HIGH
EBV VCA IgM: <36 U/mL

## 2024-01-24 LAB — CERVICOVAGINAL ANCILLARY ONLY
Bacterial Vaginitis (gardnerella): NEGATIVE
Candida Glabrata: NEGATIVE
Candida Vaginitis: NEGATIVE
Chlamydia: NEGATIVE
Comment: NEGATIVE
Comment: NEGATIVE
Comment: NEGATIVE
Comment: NEGATIVE
Comment: NEGATIVE
Comment: NORMAL
Neisseria Gonorrhea: NEGATIVE
Trichomonas: NEGATIVE

## 2024-01-24 MED ORDER — GUANFACINE HCL ER 4 MG PO TB24
4.0000 mg | ORAL_TABLET | Freq: Every morning | ORAL | 0 refills | Status: AC
  Filled 2024-01-30: qty 30, 30d supply, fill #0

## 2024-01-24 MED ORDER — LORAZEPAM 1 MG PO TABS
1.0000 mg | ORAL_TABLET | Freq: Three times a day (TID) | ORAL | 0 refills | Status: AC | PRN
  Filled 2024-01-25: qty 90, 30d supply, fill #0
  Filled 2024-02-27 (×4): qty 90, 30d supply, fill #1
  Filled 2024-03-27: qty 90, 30d supply, fill #2

## 2024-01-25 ENCOUNTER — Other Ambulatory Visit (HOSPITAL_COMMUNITY): Payer: Self-pay

## 2024-01-27 ENCOUNTER — Other Ambulatory Visit: Payer: Self-pay

## 2024-01-27 ENCOUNTER — Other Ambulatory Visit (HOSPITAL_COMMUNITY): Payer: Self-pay

## 2024-01-27 ENCOUNTER — Telehealth: Payer: Self-pay

## 2024-01-27 NOTE — Telephone Encounter (Signed)
 Copied from CRM [id number]. Topic: Clinical - Lab/Test Results >> Jan 27, 2024  8:15 AM Georgeann Kindred wrote: Reason for CRM: Mom, [name], calling with concerns about lab results for the patient. She states that the [name] was reading high and she is concerned on whether the patient has Mono and if so what does that mean for her and the remainder of the family that is living in the home with the patient. Please contact [name], mom, at [phone].

## 2024-01-27 NOTE — Telephone Encounter (Signed)
 Pt sent Mychart message to PCP w/ questions. Forwarded to PCP

## 2024-01-28 ENCOUNTER — Other Ambulatory Visit (HOSPITAL_COMMUNITY): Payer: Self-pay

## 2024-01-28 ENCOUNTER — Encounter: Payer: Self-pay | Admitting: Family Medicine

## 2024-01-28 DIAGNOSIS — E559 Vitamin D deficiency, unspecified: Secondary | ICD-10-CM

## 2024-01-29 LAB — VITAMIN D 25 HYDROXY (VIT D DEFICIENCY, FRACTURES): Vit D, 25-Hydroxy: 34 ng/mL (ref 30–100)

## 2024-01-29 LAB — CBC WITH DIFFERENTIAL/PLATELET
Absolute Lymphocytes: 3149 {cells}/uL (ref 850–3900)
Absolute Monocytes: 701 {cells}/uL (ref 200–950)
Basophils Absolute: 54 {cells}/uL (ref 0–200)
Basophils Relative: 0.7 %
Eosinophils Absolute: 223 {cells}/uL (ref 15–500)
Eosinophils Relative: 2.9 %
HCT: 40.6 % (ref 35.0–45.0)
Hemoglobin: 13.1 g/dL (ref 11.7–15.5)
MCH: 29.2 pg (ref 27.0–33.0)
MCHC: 32.3 g/dL (ref 32.0–36.0)
MCV: 90.4 fL (ref 80.0–100.0)
MPV: 9.7 fL (ref 7.5–12.5)
Monocytes Relative: 9.1 %
Neutro Abs: 3573 {cells}/uL (ref 1500–7800)
Neutrophils Relative %: 46.4 %
Platelets: 327 10*3/uL (ref 140–400)
RBC: 4.49 10*6/uL (ref 3.80–5.10)
RDW: 13.7 % (ref 11.0–15.0)
Total Lymphocyte: 40.9 %
WBC: 7.7 10*3/uL (ref 3.8–10.8)

## 2024-01-29 LAB — COMPREHENSIVE METABOLIC PANEL WITH GFR
AG Ratio: 1.5 (calc) (ref 1.0–2.5)
ALT: 13 U/L (ref 6–29)
AST: 12 U/L (ref 10–30)
Albumin: 4 g/dL (ref 3.6–5.1)
Alkaline phosphatase (APISO): 81 U/L (ref 31–125)
BUN: 12 mg/dL (ref 7–25)
CO2: 23 mmol/L (ref 20–32)
Calcium: 9 mg/dL (ref 8.6–10.2)
Chloride: 103 mmol/L (ref 98–110)
Creat: 0.82 mg/dL (ref 0.50–0.96)
Globulin: 2.7 g/dL (ref 1.9–3.7)
Glucose, Bld: 98 mg/dL (ref 65–99)
Potassium: 4.2 mmol/L (ref 3.5–5.3)
Sodium: 135 mmol/L (ref 135–146)
Total Bilirubin: 0.3 mg/dL (ref 0.2–1.2)
Total Protein: 6.7 g/dL (ref 6.1–8.1)
eGFR: 99 mL/min/{1.73_m2} (ref >=60)

## 2024-01-29 LAB — EPSTEIN-BARR VIRUS VCA ANTIBODY PANEL
EBV NA IgG: >600 U/mL — ABNORMAL HIGH
EBV VCA IgG: >750 U/mL — ABNORMAL HIGH
EBV VCA IgM: <36 U/mL

## 2024-01-29 LAB — TSH: TSH: 0.85 m[IU]/L

## 2024-01-29 LAB — MAGNESIUM: Magnesium: 2 mg/dL (ref 1.5–2.5)

## 2024-01-29 LAB — MONONUCLEOSIS SCREEN: Heterophile, Mono Screen: NEGATIVE

## 2024-01-29 LAB — VITAMIN B12: Vitamin B-12: 508 pg/mL (ref 200–1100)

## 2024-01-30 ENCOUNTER — Other Ambulatory Visit (HOSPITAL_COMMUNITY): Payer: Self-pay

## 2024-01-30 NOTE — Telephone Encounter (Signed)
 Routing to front,referral is for [facility] has opening on July 15th would need to be 30 minutes

## 2024-02-01 ENCOUNTER — Other Ambulatory Visit (HOSPITAL_COMMUNITY): Payer: Self-pay

## 2024-02-01 MED ORDER — VITAMIN D (ERGOCALCIFEROL) 1.25 MG (50000 UNIT) PO CAPS
50000.0000 [IU] | ORAL_CAPSULE | ORAL | 1 refills | Status: DC
  Filled 2024-02-01: qty 12, 84d supply, fill #0

## 2024-02-03 ENCOUNTER — Other Ambulatory Visit (HOSPITAL_COMMUNITY): Payer: Self-pay

## 2024-02-03 MED ORDER — VITAMIN D (ERGOCALCIFEROL) 1.25 MG (50000 UNIT) PO CAPS
50000.0000 [IU] | ORAL_CAPSULE | ORAL | 1 refills | Status: DC
  Filled 2024-02-03: qty 12, 84d supply, fill #0
  Filled 2024-05-12: qty 12, 84d supply, fill #1

## 2024-02-10 ENCOUNTER — Other Ambulatory Visit (HOSPITAL_COMMUNITY): Payer: Self-pay

## 2024-02-11 ENCOUNTER — Other Ambulatory Visit (HOSPITAL_COMMUNITY): Payer: Self-pay

## 2024-02-11 ENCOUNTER — Other Ambulatory Visit: Payer: Self-pay

## 2024-02-11 MED ORDER — GUANFACINE HCL ER 4 MG PO TB24
4.0000 mg | ORAL_TABLET | Freq: Every morning | ORAL | 3 refills | Status: AC
  Filled 2024-02-29 (×2): qty 90, 90d supply, fill #0
  Filled 2024-05-12 – 2024-05-14 (×2): qty 90, 90d supply, fill #1
  Filled 2024-08-29: qty 90, 90d supply, fill #2

## 2024-02-11 MED ORDER — BUPROPION HCL ER (XL) 150 MG PO TB24
150.0000 mg | ORAL_TABLET | Freq: Every morning | ORAL | 3 refills | Status: AC
  Filled 2024-02-11: qty 90, 90d supply, fill #0
  Filled 2024-05-05: qty 90, 90d supply, fill #1
  Filled 2024-07-20: qty 90, 90d supply, fill #2

## 2024-02-11 MED ORDER — QUETIAPINE FUMARATE 25 MG PO TABS
75.0000 mg | ORAL_TABLET | Freq: Every day | ORAL | 0 refills | Status: DC
  Filled 2024-02-11: qty 270, 90d supply, fill #0

## 2024-02-11 MED ORDER — INVEGA HAFYERA 1560 MG/5ML IM SUSY
PREFILLED_SYRINGE | INTRAMUSCULAR | 0 refills | Status: AC
  Filled 2024-07-14: qty 5, 90d supply, fill #0
  Filled 2024-07-21: qty 5, 180d supply, fill #0

## 2024-02-13 ENCOUNTER — Other Ambulatory Visit: Payer: Self-pay | Admitting: Family Medicine

## 2024-02-13 ENCOUNTER — Other Ambulatory Visit (HOSPITAL_COMMUNITY): Payer: Self-pay

## 2024-02-13 MED ORDER — FLUCONAZOLE 150 MG PO TABS
150.0000 mg | ORAL_TABLET | Freq: Every day | ORAL | 0 refills | Status: AC
Start: 2024-02-13 — End: ?
  Filled 2024-02-13: qty 2, 2d supply, fill #0

## 2024-02-15 ENCOUNTER — Other Ambulatory Visit (HOSPITAL_COMMUNITY): Payer: Self-pay

## 2024-02-19 ENCOUNTER — Other Ambulatory Visit (HOSPITAL_COMMUNITY): Payer: Self-pay

## 2024-02-27 ENCOUNTER — Other Ambulatory Visit: Payer: Self-pay

## 2024-02-27 ENCOUNTER — Other Ambulatory Visit (HOSPITAL_COMMUNITY): Payer: Self-pay

## 2024-02-28 ENCOUNTER — Other Ambulatory Visit (HOSPITAL_COMMUNITY): Payer: Self-pay

## 2024-02-29 ENCOUNTER — Other Ambulatory Visit (HOSPITAL_COMMUNITY): Payer: Self-pay

## 2024-03-02 ENCOUNTER — Ambulatory Visit: Admitting: Internal Medicine

## 2024-03-02 ENCOUNTER — Encounter: Payer: Self-pay | Admitting: Internal Medicine

## 2024-03-10 ENCOUNTER — Other Ambulatory Visit (HOSPITAL_COMMUNITY): Payer: Self-pay

## 2024-03-26 ENCOUNTER — Other Ambulatory Visit (HOSPITAL_COMMUNITY): Payer: Self-pay

## 2024-03-27 ENCOUNTER — Other Ambulatory Visit: Payer: Self-pay

## 2024-04-01 ENCOUNTER — Other Ambulatory Visit: Payer: Self-pay

## 2024-04-01 ENCOUNTER — Other Ambulatory Visit (HOSPITAL_COMMUNITY): Payer: Self-pay

## 2024-04-02 ENCOUNTER — Other Ambulatory Visit (HOSPITAL_BASED_OUTPATIENT_CLINIC_OR_DEPARTMENT_OTHER): Payer: Self-pay

## 2024-05-05 ENCOUNTER — Other Ambulatory Visit: Payer: Self-pay

## 2024-05-05 ENCOUNTER — Other Ambulatory Visit (HOSPITAL_COMMUNITY): Payer: Self-pay

## 2024-05-05 MED ORDER — QUETIAPINE FUMARATE 25 MG PO TABS
75.0000 mg | ORAL_TABLET | Freq: Every day | ORAL | 0 refills | Status: AC
  Filled 2024-05-05: qty 270, 90d supply, fill #0

## 2024-05-05 MED ORDER — LORAZEPAM 1 MG PO TABS
ORAL_TABLET | ORAL | 0 refills | Status: DC
  Filled 2024-05-05: qty 90, 30d supply, fill #0
  Filled 2024-06-17: qty 90, 30d supply, fill #1
  Filled 2024-07-20: qty 90, 30d supply, fill #2

## 2024-05-06 ENCOUNTER — Other Ambulatory Visit: Payer: Self-pay

## 2024-05-12 ENCOUNTER — Other Ambulatory Visit (HOSPITAL_COMMUNITY): Payer: Self-pay

## 2024-05-18 ENCOUNTER — Other Ambulatory Visit (HOSPITAL_COMMUNITY): Payer: Self-pay

## 2024-05-22 ENCOUNTER — Telehealth: Payer: Self-pay | Admitting: Family Medicine

## 2024-05-22 NOTE — Telephone Encounter (Signed)
 Pts mo dropped off forms to be completed by pcp. Pt's mom asks that she be called when forms are completed so she can pick them up. Placed form in pcp's bin.

## 2024-06-17 ENCOUNTER — Other Ambulatory Visit (HOSPITAL_COMMUNITY): Payer: Self-pay

## 2024-06-17 ENCOUNTER — Other Ambulatory Visit: Payer: Self-pay

## 2024-06-23 ENCOUNTER — Other Ambulatory Visit (HOSPITAL_COMMUNITY): Payer: Self-pay

## 2024-06-29 ENCOUNTER — Other Ambulatory Visit (HOSPITAL_COMMUNITY): Payer: Self-pay

## 2024-06-29 ENCOUNTER — Other Ambulatory Visit: Payer: Self-pay

## 2024-06-29 MED ORDER — PREDNISONE 20 MG PO TABS
40.0000 mg | ORAL_TABLET | Freq: Every day | ORAL | 0 refills | Status: AC
  Filled 2024-06-29: qty 10, 5d supply, fill #0

## 2024-06-29 MED ORDER — ALBUTEROL SULFATE HFA 108 (90 BASE) MCG/ACT IN AERS
2.0000 | INHALATION_SPRAY | Freq: Four times a day (QID) | RESPIRATORY_TRACT | 1 refills | Status: AC
  Filled 2024-06-29 (×2): qty 18, 25d supply, fill #0

## 2024-07-06 ENCOUNTER — Ambulatory Visit: Admitting: Internal Medicine

## 2024-07-06 ENCOUNTER — Ambulatory Visit (INDEPENDENT_AMBULATORY_CARE_PROVIDER_SITE_OTHER)

## 2024-07-06 VITALS — BP 102/76 | HR 107 | Temp 98.6°F | Ht 64.0 in | Wt 260.6 lb

## 2024-07-06 DIAGNOSIS — R053 Chronic cough: Secondary | ICD-10-CM

## 2024-07-06 DIAGNOSIS — J849 Interstitial pulmonary disease, unspecified: Secondary | ICD-10-CM

## 2024-07-06 DIAGNOSIS — F419 Anxiety disorder, unspecified: Secondary | ICD-10-CM

## 2024-07-06 DIAGNOSIS — R0600 Dyspnea, unspecified: Secondary | ICD-10-CM | POA: Diagnosis not present

## 2024-07-06 DIAGNOSIS — Z6841 Body Mass Index (BMI) 40.0 and over, adult: Secondary | ICD-10-CM

## 2024-07-06 DIAGNOSIS — F319 Bipolar disorder, unspecified: Secondary | ICD-10-CM

## 2024-07-06 DIAGNOSIS — E669 Obesity, unspecified: Secondary | ICD-10-CM

## 2024-07-06 LAB — C-REACTIVE PROTEIN: CRP: 5 mg/dL (ref 0.5–20.0)

## 2024-07-06 LAB — SEDIMENTATION RATE: Sed Rate: 54 mm/h — ABNORMAL HIGH (ref 0–20)

## 2024-07-06 NOTE — Progress Notes (Deleted)
 [name], female    DOB: May 13, 1996   MRN: [id number]   Brief patient profile:  28  *** referred to pulmonary clinic 07/06/2024 by *** for ***      Pt not previously seen by PCCM service.  History of Present Illness  07/06/2024  Pulmonary/ 1st office eval/Kvion Shapley  No chief complaint on file.    Dyspnea:  *** Cough: *** Sleep: *** SABA use: *** 02 use:*** LDSCT:***  No obvious day to day or daytime pattern/variability or assoc excess/ purulent sputum or mucus plugs or hemoptysis or cp or chest tightness, subjective wheeze or overt sinus or hb symptoms.    Also denies any obvious fluctuation of symptoms with weather or environmental changes or other aggravating or alleviating factors except as outlined above   No unusual exposure hx or h/o childhood pna/ asthma or knowledge of premature birth.  Current Allergies, Complete Past Medical History, Past Surgical History, Family History, and Social History were reviewed in [employer] record.  ROS  The following are not active complaints unless bolded Hoarseness, sore throat, dysphagia, dental problems, itching, sneezing,  nasal congestion or discharge of excess mucus or purulent secretions, ear ache,   fever, chills, sweats, unintended wt loss or wt gain, classically pleuritic or exertional cp,  orthopnea pnd or arm/hand swelling  or leg swelling, presyncope, palpitations, abdominal pain, anorexia, nausea, vomiting, diarrhea  or change in bowel habits or change in bladder habits, change in stools or change in urine, dysuria, hematuria,  rash, arthralgias, visual complaints, headache, numbness, weakness or ataxia or problems with walking or coordination,  change in mood or  memory.             Outpatient Medications Prior to Visit  Medication Sig Dispense Refill   albuterol  (VENTOLIN  HFA) 108 (90 Base) MCG/ACT inhaler Inhale 2 puffs into the lungs every 6 (six) hours as needed for wheezing or shortness of  breath. 18 g 0   albuterol  (VENTOLIN  HFA) 108 (90 Base) MCG/ACT inhaler Inhale 2 puffs into the lungs every 6 (six) hours as needed for wheezing. 18 g 1   azelastine  (ASTELIN ) 0.1 % nasal spray Place 2 sprays into both nostrils daily as needed for rhinitis or allergies as directed 30 mL 12   beclomethasone (QVAR  REDIHALER) 40 MCG/ACT inhaler Inhale 2 puffs into the lungs 2 (two) times daily. 10.6 g 5   benzonatate  (TESSALON ) 200 MG capsule Take 1 capsule (200 mg total) by mouth 2 (two) times daily as needed for cough. 20 capsule 0   buPROPion  (WELLBUTRIN  XL) 150 MG 24 hr tablet Take 1 tablet (150 mg total) by mouth every morning. 90 tablet 3   Ergocalciferol  (VITAMIN D2) 50 MCG (2000 UT) TABS Take by mouth.     fluconazole  (DIFLUCAN ) 150 MG tablet Take 1 tablet (150 mg total) by mouth today. May repeat in 3 days if needed. 2 tablet 0   fluticasone  (FLONASE ) 50 MCG/ACT nasal spray Place 2 sprays into both nostrils daily. 16 g 5   guanFACINE  (INTUNIV ) 1 MG TB24 ER tablet Take 1 tablet (1 mg total) by mouth every morning. 90 tablet 0   guanFACINE  (INTUNIV ) 2 MG TB24 ER tablet Take 1 tablet (2 mg total) by mouth in the morning. 30 tablet 0   guanFACINE  (INTUNIV ) 4 MG TB24 ER tablet Take 1 tablet (4 mg total) by mouth every morning. 30 tablet 0   guanFACINE  (INTUNIV ) 4 MG TB24 ER tablet Take 1 tablet (4 mg  total) by mouth every morning. 30 tablet 0   guanFACINE  (INTUNIV ) 4 MG TB24 ER tablet Take 1 tablet (4 mg total) by mouth every morning. 90 tablet 3   LORazepam  (ATIVAN ) 1 MG tablet Take 1 tablet (1 mg total) by mouth 2 (two) times daily. 180 tablet 0   LORazepam  (ATIVAN ) 1 MG tablet Take 1 tablet (1 mg total) by mouth 3 (three) times daily as needed. 270 tablet 0   LORazepam  (ATIVAN ) 1 MG tablet Take 1 tablet (1 mg total) by mouth 3 (three) times daily as needed. 270 tablet 0   paliperidone  (INVEGA ) 1.5 MG 24 hr tablet Take 1 tablet (1.5 mg total) by mouth at bedtime. 30 tablet 2   paliperidone   palmitate ER (INVEGA  HAFYERA) 1560 MG/5ML intramuscular injection EVERY 6MOS 1 mL 0   Paliperidone  Palmitate ER (INVEGA  HAFYERA) 1560 MG/5ML SUSY Inject 1,560 mg into the muscle every 6 (six) months. 5 mL 0   predniSONE  (DELTASONE ) 20 MG tablet Take 2 tablets (40 mg total) by mouth daily for 5 days 10 tablet 0   QUEtiapine  (SEROQUEL ) 100 MG tablet Take 1 - 3 tablets by mouth at bedtime. 270 tablet 3   QUEtiapine  (SEROQUEL ) 25 MG tablet Take 3 tablets (75 mg total) by mouth at bedtime. 270 tablet 0   valACYclovir  (VALTREX ) 1000 MG tablet Take 1 tablet (1,000 mg total) by mouth daily. 30 tablet 5   Vitamin D , Ergocalciferol , (DRISDOL ) 1.25 MG (50000 UNIT) CAPS capsule Take 1 capsule (50,000 Units total) by mouth every 7 (seven) days. 12 capsule 1   No facility-administered medications prior to visit.    Past Medical History:  Diagnosis Date   Acute vaginitis 05/20/2019   ADD (attention deficit disorder) 04/24/2011   ADHD (attention deficit hyperactivity disorder)    Allergy    Anxiety    Benzodiazepine dependence (HCC) 05/12/2020   Bipolar disorder (HCC)    Bipolar disorder, current episode manic severe with psychotic features (HCC)    Bipolar I disorder (HCC) 05/12/2020   Constipation 04/03/2018   Delirium tremens (HCC) 05/20/2019   Depression    Eczema 12/11/13   Genital herpes simplex 10/24/2017   High risk heterosexual behavior 05/20/2019   MDD (major depressive disorder), recurrent severe, without psychosis (HCC) 02/11/2018   Morbid obesity (HCC) 09/06/2019   Pelvic pain 01/31/2018   Possible pregnancy 09/06/2019   Preventative health care 03/16/2013   Schizo-affective schizophrenia (HCC)    Schizoaffective disorder (HCC) 03/08/2018   Seizure (HCC) 05/20/2019   Severe bipolar I disorder, current or most recent episode depressed (HCC) 03/30/2017   STD exposure 05/13/2015   Syncope 03/03/2017   Vitamin D  deficiency 03/10/2019      Objective:     There were no vitals taken for this  visit.         Assessment

## 2024-07-06 NOTE — Progress Notes (Signed)
 New Patient Pulmonology Office Visit   Subjective:  Patient ID: [name], female    DOB: 1996-01-23  MRN: [id number]  Referred by: Tobb, Kardie, DO  CC:  Chief Complaint  Patient presents with   Consult    Pt states she has been coughing for over 1 year, was seeing cardio dr due to having fast heart rate Dry cough and Prod cough ( phlegm white) PFT was done in April 2025 Last Monday pt 06/29/2024 was seen by [facility] due to coughing  SOB occurs w/ exertion and also w/o exertion ( sitting still)   Discussed the use of AI scribe software for clinical note transcription with the patient, who gave verbal consent to proceed.  History of Present Illness [name] is a 28 year old female with anxiety, depression, bipolar disorder, schizoaffective disorder who presents with chronic cough and breathing difficulties. She is accompanied by her mother, [name] has experienced breathing difficulties and a chronic cough for over a year. The cough is constant and severe. She was previously on propranolol  for tachycardia and anxiety, but her mother discontinued it due to the persistent cough. After stopping propranolol , the cough improved slightly.  A pulmonary function test was conducted in March, and it showed severe restriction and reduction of the DLCO, it seems patient was not able to perform well the test.   She went to urgent care due to 3 weeks of non productive cough, nasal/sinus congestion. She was prescribed prednisone  starting at 20 mg with taper for a total of 7 days with improvement of the cough. Her mother adjusted the dose due to concerns about interactions with her psychotropic medications.  Trinitee has a history of anxiety and schizoaffective, with auditory hallucinations and past suicidal ideation, but has been stable for the past two years. She is on disability and stays at home most of the time, contributing to feelings of loneliness and anxiety.  Denied  autoimmune conditions or symptoms. She has gained significant weight over the last 5 years due to her psychiatric medications and depression. She loves singing. Denied history  of asthma or wheezing. Denied exposure to birds. Denied smoking, vaping, alcohol, or using drugs.   ROS as above   Allergies: Concerta [methylphenidate], Terbinafine, Trazodone  and nefazodone, Clindamycin hcl, Haldol  [haloperidol ], and Terbinafine hcl  Current Outpatient Medications:    albuterol  (VENTOLIN  HFA) 108 (90 Base) MCG/ACT inhaler, Inhale 2 puffs into the lungs every 6 (six) hours as needed for wheezing or shortness of breath., Disp: 18 g, Rfl: 0   albuterol  (VENTOLIN  HFA) 108 (90 Base) MCG/ACT inhaler, Inhale 2 puffs into the lungs every 6 (six) hours as needed for wheezing., Disp: 18 g, Rfl: 1   azelastine  (ASTELIN ) 0.1 % nasal spray, Place 2 sprays into both nostrils daily as needed for rhinitis or allergies as directed, Disp: 30 mL, Rfl: 12   benzonatate  (TESSALON ) 200 MG capsule, Take 1 capsule (200 mg total) by mouth 2 (two) times daily as needed for cough., Disp: 20 capsule, Rfl: 0   buPROPion  (WELLBUTRIN  XL) 150 MG 24 hr tablet, Take 1 tablet (150 mg total) by mouth every morning., Disp: 90 tablet, Rfl: 3   Ergocalciferol  (VITAMIN D2) 50 MCG (2000 UT) TABS, Take by mouth., Disp: , Rfl:    fluticasone  (FLONASE ) 50 MCG/ACT nasal spray, Place 2 sprays into both nostrils daily., Disp: 16 g, Rfl: 5   guanFACINE  (INTUNIV ) 1 MG TB24 ER tablet, Take 1 tablet (1 mg total) by  mouth every morning., Disp: 90 tablet, Rfl: 0   guanFACINE  (INTUNIV ) 2 MG TB24 ER tablet, Take 1 tablet (2 mg total) by mouth in the morning., Disp: 30 tablet, Rfl: 0   guanFACINE  (INTUNIV ) 4 MG TB24 ER tablet, Take 1 tablet (4 mg total) by mouth every morning., Disp: 30 tablet, Rfl: 0   guanFACINE  (INTUNIV ) 4 MG TB24 ER tablet, Take 1 tablet (4 mg total) by mouth every morning., Disp: 30 tablet, Rfl: 0   guanFACINE  (INTUNIV ) 4 MG TB24 ER  tablet, Take 1 tablet (4 mg total) by mouth every morning., Disp: 90 tablet, Rfl: 3   LORazepam  (ATIVAN ) 1 MG tablet, Take 1 tablet (1 mg total) by mouth 2 (two) times daily., Disp: 180 tablet, Rfl: 0   LORazepam  (ATIVAN ) 1 MG tablet, Take 1 tablet (1 mg total) by mouth 3 (three) times daily as needed., Disp: 270 tablet, Rfl: 0   LORazepam  (ATIVAN ) 1 MG tablet, Take 1 tablet (1 mg total) by mouth 3 (three) times daily as needed., Disp: 270 tablet, Rfl: 0   paliperidone  (INVEGA ) 1.5 MG 24 hr tablet, Take 1 tablet (1.5 mg total) by mouth at bedtime., Disp: 30 tablet, Rfl: 2   paliperidone  palmitate ER (INVEGA  HAFYERA) 1560 MG/5ML intramuscular injection, EVERY 6MOS, Disp: 1 mL, Rfl: 0   Paliperidone  Palmitate ER (INVEGA  HAFYERA) 1560 MG/5ML SUSY, Inject 1,560 mg into the muscle every 6 (six) months., Disp: 5 mL, Rfl: 0   predniSONE  (DELTASONE ) 20 MG tablet, Take 2 tablets (40 mg total) by mouth daily for 5 days, Disp: 10 tablet, Rfl: 0   valACYclovir  (VALTREX ) 1000 MG tablet, Take 1 tablet (1,000 mg total) by mouth daily., Disp: 30 tablet, Rfl: 5   Vitamin D , Ergocalciferol , (DRISDOL ) 1.25 MG (50000 UNIT) CAPS capsule, Take 1 capsule (50,000 Units total) by mouth every 7 (seven) days., Disp: 12 capsule, Rfl: 1   beclomethasone (QVAR  REDIHALER) 40 MCG/ACT inhaler, Inhale 2 puffs into the lungs 2 (two) times daily. (Patient not taking: Reported on 07/06/2024), Disp: 10.6 g, Rfl: 5   fluconazole  (DIFLUCAN ) 150 MG tablet, Take 1 tablet (150 mg total) by mouth today. May repeat in 3 days if needed., Disp: 2 tablet, Rfl: 0   QUEtiapine  (SEROQUEL ) 100 MG tablet, Take 1 - 3 tablets by mouth at bedtime. (Patient not taking: Reported on 07/06/2024), Disp: 270 tablet, Rfl: 3   QUEtiapine  (SEROQUEL ) 25 MG tablet, Take 3 tablets (75 mg total) by mouth at bedtime. (Patient not taking: Reported on 07/06/2024), Disp: 270 tablet, Rfl: 0 Past Medical History:  Diagnosis Date   Acute vaginitis 05/20/2019   ADD  (attention deficit disorder) 04/24/2011   ADHD (attention deficit hyperactivity disorder)    Allergy    Anxiety    Benzodiazepine dependence (HCC) 05/12/2020   Bipolar disorder (HCC)    Bipolar disorder, current episode manic severe with psychotic features (HCC)    Bipolar I disorder (HCC) 05/12/2020   Constipation 04/03/2018   Delirium tremens (HCC) 05/20/2019   Depression    Eczema 12/11/13   Genital herpes simplex 10/24/2017   High risk heterosexual behavior 05/20/2019   MDD (major depressive disorder), recurrent severe, without psychosis (HCC) 02/11/2018   Morbid obesity (HCC) 09/06/2019   Pelvic pain 01/31/2018   Possible pregnancy 09/06/2019   Preventative health care 03/16/2013   Schizo-affective schizophrenia (HCC)    Schizoaffective disorder (HCC) 03/08/2018   Seizure (HCC) 05/20/2019   Severe bipolar I disorder, current or most recent episode depressed (HCC) 03/30/2017  STD exposure 05/13/2015   Syncope 03/03/2017   Vitamin D  deficiency 03/10/2019   History reviewed. No pertinent surgical history. Family History  Problem Relation Age of Onset   Asthma Mother    Depression Father    Stroke Father    Heart disease Father    Asthma Father    Hypertension Father        pulmonary hypertension   Heart disease Maternal Uncle    Leukemia Maternal Uncle    Diabetes Maternal Grandfather    Hypertension Maternal Grandmother    COPD Maternal Grandmother    Diabetes Paternal Grandfather    Stroke Paternal Grandmother    Diabetes Paternal Grandmother    Social History   Socioeconomic History   Marital status: Single    Spouse name: Not on file   Number of children: 0   Years of education: Not on file   Highest education level: Associate degree: academic program  Occupational History   Occupation: Unemployed  Tobacco Use   Smoking status: Never   Smokeless tobacco: Never  Vaping Use   Vaping status: Former   Quit date: 09/01/2017  Substance and Sexual Activity   Alcohol use: Not  Currently    Comment: occasionally   Drug use: Not Currently   Sexual activity: Not Currently    Birth control/protection: Pill  Other Topics Concern   Not on file  Social History Narrative   Lives with mom in an apartment on the second floor.  No children.  Currently not working.  Education: college. Left handed   Drinks no caffiene   Lives with mom, sista and niece   Social Drivers of Corporate Investment Banker Strain: Low Risk  (07/11/2022)   Overall Financial Resource Strain (CARDIA)    Difficulty of Paying Living Expenses: Not hard at all  Food Insecurity: Low Risk  (06/29/2024)   Received from [facility]   Hunger Vital Sign    Within the past 12 months, you worried that your food would run out before you got money to buy more: Never true    Within the past 12 months, the food you bought just didn't last and you didn't have money to get more. : Never true  Transportation Needs: No Transportation Needs (06/29/2024)   Received from [facility]    In the past 12 months, has lack of reliable transportation kept you from medical appointments, meetings, work or from getting things needed for daily living? : No  Physical Activity: Inactive (07/11/2022)   Exercise Vital Sign    Days of Exercise per Week: 0 days    Minutes of Exercise per Session: 0 min  Stress: No Stress Concern Present (07/11/2022)   [employer] of Occupational Health - Occupational Stress Questionnaire    Feeling of Stress : Only a little  Social Connections: Moderately Isolated (07/11/2022)   Social Connection and Isolation Panel    Frequency of Communication with Friends and Family: More than three times a week    Frequency of Social Gatherings with Friends and Family: More than three times a week    Attends Religious Services: 1 to 4 times per year    Active Member of [employer] or Organizations: No    Attends Banker Meetings: Never    Marital Status: Never married   Intimate Partner Violence: Not At Risk (07/11/2022)   Humiliation, Afraid, Rape, and Kick questionnaire    Fear of Current or Ex-Partner: No    Emotionally  Abused: No    Physically Abused: No    Sexually Abused: No       Objective:  BP 102/76   Pulse (!) 107   Temp 98.6 F (37 C) (Oral)   Ht 5' 4 (1.626 m) Comment: per patient  Wt 260 lb 9.6 oz (118.2 kg)   SpO2 97% Comment: on RA  BMI 44.73 kg/m  Wt Readings from Last 3 Encounters:  07/06/24 260 lb 9.6 oz (118.2 kg)  01/23/24 261 lb 12.8 oz (118.8 kg)  11/04/23 257 lb (116.6 kg)   BMI Readings from Last 3 Encounters:  07/06/24 44.73 kg/m  01/23/24 44.94 kg/m  11/04/23 44.11 kg/m   SpO2 Readings from Last 3 Encounters:  07/06/24 97%  01/23/24 98%  09/03/23 94%    Physical Exam Constitutional:      Appearance: Normal appearance.  HENT:     Mouth/Throat:     Mouth: Mucous membranes are moist.  Eyes:     Extraocular Movements: Extraocular movements intact.     Pupils: Pupils are equal, round, and reactive to light.  Cardiovascular:     Rate and Rhythm: Normal rate and regular rhythm.  Pulmonary:     Effort: Pulmonary effort is normal.     Breath sounds: Normal breath sounds.     Comments: No wheezing Musculoskeletal:        General: Normal range of motion.  Neurological:     General: No focal deficit present.     Mental Status: She is alert and oriented to person, place, and time.    PFTs 11/2023 FVC   2.2, -3.6 FEV1  2, -3.2 F/F  91% TLC    5, +0.01 RV      2.6,+3.3 DLCO 13.7,-3.32  11/2023- Moderate obstruction with no response to BD, air trapping, restriction, and moderate reduction of DLCO.    Echo 2021 EF 60-65%, LV fx normal, no WMA.  RV fx is normal, RV size is normal. Normal pulm artery pressure. No valvulopathy  Assessment & Plan:    Assessment and Plan Assessment & Plan Chronic cough and dyspnea under evaluation for interstitial lung disease Chronic cough and dyspnea for over a  year. Her PFTs showed restriction, moderate obstruction and reduction of the DLCO. It seems she had difficulty performing the PFTs. Differential includes interstitial lung disease.  Prednisone  taper completed with improvement in symptoms. Orders:   CT CHEST HIGH RESOLUTION; Future   ANA; Future   Anti-DNA antibody, double-stranded; Future   Anti-scleroderma antibody; Future   Anti-Jo-1 Ab (RDL); Future   RNP Antibodies; Future   Rheumatoid factor; Future   Cyclic citrul peptide antibody, IgG; Future   Sedimentation rate; Future   C-reactive protein; Future  Obesity BMI 44 Contributing to dyspnea. Discussed the impact of weight on respiratory function and overall health. Encouraged weight loss through diet and exercise. - Encouraged weight loss through diet and exercise. - Recommended joining a gym or using home exercise equipment. - Encouraged participation in a day program for social interaction and mental health support.  Bipolar disorder, depression, anxiety, schizoaffective disorder. Significant impact on daily life, including social isolation and anxiety. Discussed the importance of social interaction and exercise in managing symptoms. - Encouraged participation in a day program for social interaction. - Recommended exercise as a non-pharmacological intervention for depression. - Discussed the potential benefits of singing and other enjoyable activities for mental health. - She is already following with psychiatrist.    RTC in 6 weeks.   Marny Patch,  MD Pulmonary and Critical Care Medicine [facility]

## 2024-07-06 NOTE — Patient Instructions (Addendum)
 Dear [name]; Given your shortness of breath and pulmonary function test, I will recommend the following: -CT chest scan  -Autoimmune labs today -Highly recommend to lose weight and start working out at the gym. I believe that will make a big difference in your daily life.   I will see you in January to discuss the tests.

## 2024-07-07 ENCOUNTER — Other Ambulatory Visit (HOSPITAL_COMMUNITY): Payer: Self-pay

## 2024-07-07 LAB — RNP ANTIBODIES: ENA RNP Ab: <0.2 AI (ref 0.0–0.9)

## 2024-07-08 ENCOUNTER — Ambulatory Visit: Payer: Self-pay

## 2024-07-08 LAB — ANTI-SCLERODERMA ANTIBODY: Scleroderma (Scl-70) (ENA) Antibody, IgG: <1 AI

## 2024-07-08 LAB — CYCLIC CITRUL PEPTIDE ANTIBODY, IGG: Cyclic Citrullin Peptide Ab: <16 U

## 2024-07-08 LAB — ANA: Anti Nuclear Antibody (ANA): NEGATIVE

## 2024-07-08 LAB — ANTI-DNA ANTIBODY, DOUBLE-STRANDED: ds DNA Ab: <1 [IU]/mL

## 2024-07-08 LAB — RHEUMATOID FACTOR: Rheumatoid fact SerPl-aCnc: <10 [IU]/mL (ref <14)

## 2024-07-13 LAB — ANTI-JO-1 AB (RDL): Anti-Jo-1 Ab (RDL): <20 U (ref <20)

## 2024-07-14 ENCOUNTER — Other Ambulatory Visit: Payer: Self-pay

## 2024-07-16 ENCOUNTER — Other Ambulatory Visit (HOSPITAL_COMMUNITY): Payer: Self-pay

## 2024-07-20 ENCOUNTER — Other Ambulatory Visit: Payer: Self-pay

## 2024-07-20 ENCOUNTER — Ambulatory Visit (HOSPITAL_COMMUNITY)

## 2024-07-20 ENCOUNTER — Other Ambulatory Visit (HOSPITAL_COMMUNITY): Payer: Self-pay

## 2024-07-21 ENCOUNTER — Other Ambulatory Visit: Payer: Self-pay

## 2024-07-21 NOTE — Progress Notes (Signed)
 Specialty Pharmacy Refill Coordination Note  [name] is a 28 y.o. female, patients mother was contacted today regarding refills of specialty medication(s) Paliperidone  Palmitate (Invega  Hafyera)   Patient requested Delivery   Delivery date: 07/30/24   Verified address: [address] -[id number]   Medication will be filled on: 07/29/24

## 2024-07-22 ENCOUNTER — Other Ambulatory Visit (HOSPITAL_COMMUNITY): Payer: Self-pay

## 2024-07-22 ENCOUNTER — Other Ambulatory Visit: Payer: Self-pay

## 2024-07-22 MED ORDER — PALIPERIDONE ER 6 MG PO TB24
6.0000 mg | ORAL_TABLET | Freq: Every day | ORAL | 0 refills | Status: DC
  Filled 2024-07-22: qty 30, 30d supply, fill #0

## 2024-07-29 ENCOUNTER — Other Ambulatory Visit: Payer: Self-pay

## 2024-08-02 ENCOUNTER — Other Ambulatory Visit: Payer: Self-pay | Admitting: Family Medicine

## 2024-08-02 ENCOUNTER — Other Ambulatory Visit (HOSPITAL_COMMUNITY): Payer: Self-pay

## 2024-08-02 DIAGNOSIS — H6593 Unspecified nonsuppurative otitis media, bilateral: Secondary | ICD-10-CM

## 2024-08-02 DIAGNOSIS — R053 Chronic cough: Secondary | ICD-10-CM

## 2024-08-03 NOTE — Telephone Encounter (Signed)
 [place] patient, please review.

## 2024-08-04 ENCOUNTER — Other Ambulatory Visit: Payer: Self-pay

## 2024-08-04 ENCOUNTER — Other Ambulatory Visit (HOSPITAL_COMMUNITY): Payer: Self-pay

## 2024-08-04 MED ORDER — PERPHENAZINE 2 MG PO TABS
2.0000 mg | ORAL_TABLET | Freq: Every day | ORAL | 0 refills | Status: AC
  Filled 2024-08-04: qty 90, 90d supply, fill #0

## 2024-08-04 MED ORDER — LORAZEPAM 1 MG PO TABS
1.0000 mg | ORAL_TABLET | ORAL | 0 refills | Status: AC
  Filled 2024-08-04: qty 315, 79d supply, fill #0
  Filled 2024-08-24: qty 120, 30d supply, fill #0

## 2024-08-05 ENCOUNTER — Other Ambulatory Visit (HOSPITAL_COMMUNITY): Payer: Self-pay

## 2024-08-07 ENCOUNTER — Other Ambulatory Visit: Payer: Self-pay

## 2024-08-16 ENCOUNTER — Other Ambulatory Visit (HOSPITAL_COMMUNITY): Payer: Self-pay

## 2024-08-17 ENCOUNTER — Other Ambulatory Visit (HOSPITAL_COMMUNITY): Payer: Self-pay

## 2024-08-17 ENCOUNTER — Other Ambulatory Visit: Payer: Self-pay | Admitting: Family Medicine

## 2024-08-17 ENCOUNTER — Other Ambulatory Visit: Payer: Self-pay

## 2024-08-17 DIAGNOSIS — E559 Vitamin D deficiency, unspecified: Secondary | ICD-10-CM

## 2024-08-17 MED ORDER — VITAMIN D (ERGOCALCIFEROL) 1.25 MG (50000 UNIT) PO CAPS
50000.0000 [IU] | ORAL_CAPSULE | ORAL | 1 refills | Status: AC
  Filled 2024-08-17: qty 12, 84d supply, fill #0

## 2024-08-23 NOTE — Progress Notes (Incomplete)
 "  New Patient Pulmonology Office Visit   Subjective:  Patient ID: [name], female    DOB: [birth year]  MRN: [id number]  Referred by: Antonio Cyndee Jamee JONELLE, DO  CC:  No chief complaint on file.  Discussed the use of AI scribe software for clinical note transcription with the patient, who gave verbal consent to proceed.  History of Present Illness [name] is a 29 year old female with anxiety, depression, bipolar disorder, schizoaffective disorder who presents with chronic cough and breathing difficulties. She is accompanied by her mother, [name] has experienced breathing difficulties and a chronic cough for over a year. The cough is constant and severe. She was previously on propranolol  for tachycardia and anxiety, but her mother discontinued it due to the persistent cough. After stopping propranolol , the cough improved slightly.  A pulmonary function test was conducted in March, and it showed severe restriction and reduction of the DLCO, it seems patient was not able to perform well the test.   She went to urgent care due to 3 weeks of non productive cough, nasal/sinus congestion. She was prescribed prednisone  starting at 20 mg with taper for a total of 7 days with improvement of the cough. Her mother adjusted the dose due to concerns about interactions with her psychotropic medications.  [name] has a history of anxiety and schizoaffective, with auditory hallucinations and past suicidal ideation, but has been stable for the past two years. She is on disability and stays at home most of the time, contributing to feelings of loneliness and anxiety.  Denied autoimmune conditions or symptoms. She has gained significant weight over the last 5 years due to her psychiatric medications and depression. She loves singing. Denied history  of asthma or wheezing. Denied exposure to birds. Denied smoking, vaping, alcohol, or using drugs.   ROS as above   Allergies: Concerta  [methylphenidate], Terbinafine, Trazodone  and nefazodone, Clindamycin hcl, Haldol  [haloperidol ], and Terbinafine hcl  Current Outpatient Medications:    albuterol  (VENTOLIN  HFA) 108 (90 Base) MCG/ACT inhaler, Inhale 2 puffs into the lungs every 6 (six) hours as needed for wheezing or shortness of breath., Disp: 18 g, Rfl: 0   albuterol  (VENTOLIN  HFA) 108 (90 Base) MCG/ACT inhaler, Inhale 2 puffs into the lungs every 6 (six) hours as needed for wheezing., Disp: 18 g, Rfl: 1   azelastine  (ASTELIN ) 0.1 % nasal spray, Place 2 sprays into both nostrils daily as needed for rhinitis or allergies as directed, Disp: 30 mL, Rfl: 12   beclomethasone (QVAR  REDIHALER) 40 MCG/ACT inhaler, Inhale 2 puffs into the lungs 2 (two) times daily. (Patient not taking: Reported on 07/06/2024), Disp: 10.6 g, Rfl: 5   benzonatate  (TESSALON ) 200 MG capsule, Take 1 capsule (200 mg total) by mouth 2 (two) times daily as needed for cough., Disp: 20 capsule, Rfl: 0   buPROPion  (WELLBUTRIN  XL) 150 MG 24 hr tablet, Take 1 tablet (150 mg total) by mouth every morning., Disp: 90 tablet, Rfl: 3   Ergocalciferol  (VITAMIN D2) 50 MCG (2000 UT) TABS, Take by mouth., Disp: , Rfl:    fluconazole  (DIFLUCAN ) 150 MG tablet, Take 1 tablet (150 mg total) by mouth today. May repeat in 3 days if needed., Disp: 2 tablet, Rfl: 0   fluticasone  (FLONASE ) 50 MCG/ACT nasal spray, Place 2 sprays into both nostrils daily., Disp: 16 g, Rfl: 5   guanFACINE  (INTUNIV ) 1 MG TB24 ER tablet, Take 1 tablet (1 mg total) by mouth every morning., Disp: 90  tablet, Rfl: 0   guanFACINE  (INTUNIV ) 2 MG TB24 ER tablet, Take 1 tablet (2 mg total) by mouth in the morning., Disp: 30 tablet, Rfl: 0   guanFACINE  (INTUNIV ) 4 MG TB24 ER tablet, Take 1 tablet (4 mg total) by mouth every morning., Disp: 30 tablet, Rfl: 0   guanFACINE  (INTUNIV ) 4 MG TB24 ER tablet, Take 1 tablet (4 mg total) by mouth every morning., Disp: 30 tablet, Rfl: 0   guanFACINE  (INTUNIV ) 4 MG TB24 ER tablet,  Take 1 tablet (4 mg total) by mouth every morning., Disp: 90 tablet, Rfl: 3   LORazepam  (ATIVAN ) 1 MG tablet, Take 1 tablet (1 mg total) by mouth 2 (two) times daily., Disp: 180 tablet, Rfl: 0   LORazepam  (ATIVAN ) 1 MG tablet, Take 1 tablet (1 mg total) by mouth 3 (three) times daily as needed., Disp: 270 tablet, Rfl: 0   LORazepam  (ATIVAN ) 1 MG tablet, Take 1 tablet (1 mg total) by mouth 3-4 times daily as needed., Disp: 315 tablet, Rfl: 0   paliperidone  (INVEGA ) 1.5 MG 24 hr tablet, Take 1 tablet (1.5 mg total) by mouth at bedtime., Disp: 30 tablet, Rfl: 2   paliperidone  (INVEGA ) 6 MG 24 hr tablet, Take 1 tablet (6 mg total) by mouth at bedtime., Disp: 30 tablet, Rfl: 0   paliperidone  palmitate ER (INVEGA  HAFYERA) 1560 MG/5ML intramuscular injection, EVERY 6MOS, Disp: 5 mL, Rfl: 0   Paliperidone  Palmitate ER (INVEGA  HAFYERA) 1560 MG/5ML SUSY, Inject 1,560 mg into the muscle every 6 (six) months., Disp: 5 mL, Rfl: 0   perphenazine  (TRILAFON ) 2 MG tablet, Take 1 tablet (2 mg total) by mouth at bedtime., Disp: 90 tablet, Rfl: 0   predniSONE  (DELTASONE ) 20 MG tablet, Take 2 tablets (40 mg total) by mouth daily for 5 days, Disp: 10 tablet, Rfl: 0   QUEtiapine  (SEROQUEL ) 100 MG tablet, Take 1 - 3 tablets by mouth at bedtime. (Patient not taking: Reported on 07/06/2024), Disp: 270 tablet, Rfl: 3   QUEtiapine  (SEROQUEL ) 25 MG tablet, Take 3 tablets (75 mg total) by mouth at bedtime. (Patient not taking: Reported on 07/06/2024), Disp: 270 tablet, Rfl: 0   valACYclovir  (VALTREX ) 1000 MG tablet, Take 1 tablet (1,000 mg total) by mouth daily., Disp: 30 tablet, Rfl: 5   Vitamin D , Ergocalciferol , (DRISDOL ) 1.25 MG (50000 UNIT) CAPS capsule, Take 1 capsule (50,000 Units total) by mouth every 7 (seven) days., Disp: 12 capsule, Rfl: 1 Past Medical History:  Diagnosis Date   Acute vaginitis 05/20/2019   ADD (attention deficit disorder) 04/24/2011   ADHD (attention deficit hyperactivity disorder)    Allergy     Anxiety    Benzodiazepine dependence (HCC) 05/12/2020   Bipolar disorder (HCC)    Bipolar disorder, current episode manic severe with psychotic features (HCC)    Bipolar I disorder (HCC) 05/12/2020   Constipation 04/03/2018   Delirium tremens (HCC) 05/20/2019   Depression    Eczema 12/11/13   Genital herpes simplex 10/24/2017   High risk heterosexual behavior 05/20/2019   MDD (major depressive disorder), recurrent severe, without psychosis (HCC) 02/11/2018   Morbid obesity (HCC) 09/06/2019   Pelvic pain 01/31/2018   Possible pregnancy 09/06/2019   Preventative health care 03/16/2013   Schizo-affective schizophrenia (HCC)    Schizoaffective disorder (HCC) 03/08/2018   Seizure (HCC) 05/20/2019   Severe bipolar I disorder, current or most recent episode depressed (HCC) 03/30/2017   STD exposure 05/13/2015   Syncope 03/03/2017   Vitamin D  deficiency 03/10/2019   No  past surgical history on file. Family History  Problem Relation Age of Onset   Asthma Mother    Depression Father    Stroke Father    Heart disease Father    Asthma Father    Hypertension Father        pulmonary hypertension   Heart disease Maternal Uncle    Leukemia Maternal Uncle    Diabetes Maternal Grandfather    Hypertension Maternal Grandmother    COPD Maternal Grandmother    Diabetes Paternal Grandfather    Stroke Paternal Grandmother    Diabetes Paternal Grandmother    Social History   Socioeconomic History   Marital status: Single    Spouse name: Not on file   Number of children: 0   Years of education: Not on file   Highest education level: Associate degree: academic program  Occupational History   Occupation: Unemployed  Tobacco Use   Smoking status: Never   Smokeless tobacco: Never  Vaping Use   Vaping status: Former   Quit date: 09/01/2017  Substance and Sexual Activity   Alcohol use: Not Currently    Comment: occasionally   Drug use: Not Currently   Sexual activity: Not Currently    Birth  control/protection: Pill  Other Topics Concern   Not on file  Social History Narrative   Lives with mom in an apartment on the second floor.  No children.  Currently not working.  Education: college. Left handed   Drinks no caffiene   Lives with mom, sista and niece   Social Drivers of Health   Tobacco Use: Low Risk (07/06/2024)   Patient History    Smoking Tobacco Use: Never    Smokeless Tobacco Use: Never    Passive Exposure: Not on file  Financial Resource Strain: Low Risk (07/11/2022)   Overall Financial Resource Strain (CARDIA)    Difficulty of Paying Living Expenses: Not hard at all  Food Insecurity: Low Risk (06/29/2024)   Received from [facility]   Epic    Within the past 12 months, you worried that your food would run out before you got money to buy more: Never true    Within the past 12 months, the food you bought just didn't last and you didn't have money to get more. : Never true  Transportation Needs: No Transportation Needs (06/29/2024)   Received from [facility]    In the past 12 months, has lack of reliable transportation kept you from medical appointments, meetings, work or from getting things needed for daily living? : No  Physical Activity: Inactive (07/11/2022)   Exercise Vital Sign    Days of Exercise per Week: 0 days    Minutes of Exercise per Session: 0 min  Stress: No Stress Concern Present (07/11/2022)   [employer] of Occupational Health - Occupational Stress Questionnaire    Feeling of Stress : Only a little  Social Connections: Moderately Isolated (07/11/2022)   Social Connection and Isolation Panel    Frequency of Communication with Friends and Family: More than three times a week    Frequency of Social Gatherings with Friends and Family: More than three times a week    Attends Religious Services: 1 to 4 times per year    Active Member of [employer] or Organizations: No    Attends Banker Meetings: Never     Marital Status: Never married  Intimate Partner Violence: Not At Risk (07/11/2022)   Humiliation, Afraid, Rape, and Kick questionnaire  Fear of Current or Ex-Partner: No    Emotionally Abused: No    Physically Abused: No    Sexually Abused: No  Depression (PHQ2-9): Medium Risk (07/11/2022)   Depression (PHQ2-9)    PHQ-2 Score: 6  Alcohol Screen: Not on file  Housing: Low Risk (06/29/2024)   Received from [facility]   Epic    What is your living situation today?: I have a steady place to live    Think about the place you live. Do you have problems with any of the following? Choose all that apply:: Not on file  Utilities: Not on file  Health Literacy: Not on file       Objective:  There were no vitals taken for this visit. Wt Readings from Last 3 Encounters:  07/06/24 260 lb 9.6 oz (118.2 kg)  01/23/24 261 lb 12.8 oz (118.8 kg)  11/04/23 257 lb (116.6 kg)   BMI Readings from Last 3 Encounters:  07/06/24 44.73 kg/m  01/23/24 44.94 kg/m  11/04/23 44.11 kg/m   SpO2 Readings from Last 3 Encounters:  07/06/24 97%  01/23/24 98%  09/03/23 94%    Physical Exam Constitutional:      Appearance: Normal appearance.  HENT:     Mouth/Throat:     Mouth: Mucous membranes are moist.  Eyes:     Extraocular Movements: Extraocular movements intact.     Pupils: Pupils are equal, round, and reactive to light.  Cardiovascular:     Rate and Rhythm: Normal rate and regular rhythm.  Pulmonary:     Effort: Pulmonary effort is normal.     Breath sounds: Normal breath sounds.     Comments: No wheezing Musculoskeletal:        General: Normal range of motion.  Neurological:     General: No focal deficit present.     Mental Status: She is alert and oriented to person, place, and time.    PFTs 11/2023 FVC   2.2, -3.6 FEV1  2, -3.2 F/F  91% TLC    5, +0.01 RV      2.6,+3.3 DLCO 13.7,-3.32  11/2023- No obstruction with no response to BD, air trapping. No restriction, and  moderate reduction of DLCO.    Echo 2021 EF 60-65%, LV fx normal, no WMA.  RV fx is normal, RV size is normal. Normal pulm artery pressure. No valvulopathy  Assessment & Plan:    Assessment and Plan Assessment & Plan Chronic cough and dyspnea under evaluation for interstitial lung disease Chronic cough and dyspnea for over a year. Her PFTs showed restriction, moderate obstruction and reduction of the DLCO. It seems she had difficulty performing the PFTs. Differential includes interstitial lung disease.  Prednisone  taper completed with improvement in symptoms. Orders:   CT CHEST HIGH RESOLUTION; Future   ANA; Future   Anti-DNA antibody, double-stranded; Future   Anti-scleroderma antibody; Future   Anti-Jo-1 Ab (RDL); Future   RNP Antibodies; Future   Rheumatoid factor; Future   Cyclic citrul peptide antibody, IgG; Future   Sedimentation rate; Future   C-reactive protein; Future  Obesity BMI 44 Contributing to dyspnea. Discussed the impact of weight on respiratory function and overall health. Encouraged weight loss through diet and exercise. - Encouraged weight loss through diet and exercise. - Recommended joining a gym or using home exercise equipment. - Encouraged participation in a day program for social interaction and mental health support.  Bipolar disorder, depression, anxiety, schizoaffective disorder. Significant impact on daily life, including social isolation and anxiety.  Discussed the importance of social interaction and exercise in managing symptoms. - Encouraged participation in a day program for social interaction. - Recommended exercise as a non-pharmacological intervention for depression. - Discussed the potential benefits of singing and other enjoyable activities for mental health. - She is already following with psychiatrist.    RTC in 6 weeks.   Marny Patch, MD Pulmonary and Critical Care Medicine [facility] "

## 2024-08-24 ENCOUNTER — Other Ambulatory Visit (HOSPITAL_COMMUNITY): Payer: Self-pay

## 2024-08-24 ENCOUNTER — Ambulatory Visit

## 2024-08-27 ENCOUNTER — Other Ambulatory Visit (HOSPITAL_COMMUNITY): Payer: Self-pay

## 2024-08-27 ENCOUNTER — Other Ambulatory Visit: Payer: Self-pay

## 2024-08-27 ENCOUNTER — Other Ambulatory Visit: Payer: Self-pay | Admitting: Family Medicine

## 2024-08-27 DIAGNOSIS — H6593 Unspecified nonsuppurative otitis media, bilateral: Secondary | ICD-10-CM

## 2024-08-27 DIAGNOSIS — R053 Chronic cough: Secondary | ICD-10-CM

## 2024-08-27 DIAGNOSIS — J302 Other seasonal allergic rhinitis: Secondary | ICD-10-CM

## 2024-08-27 MED ORDER — FLUTICASONE PROPIONATE 50 MCG/ACT NA SUSP
2.0000 | Freq: Every day | NASAL | 5 refills | Status: AC
  Filled 2024-08-27: qty 16, 30d supply, fill #0
  Filled 2024-09-05: qty 16, 30d supply, fill #1

## 2024-08-28 ENCOUNTER — Other Ambulatory Visit (HOSPITAL_COMMUNITY): Payer: Self-pay

## 2024-08-28 ENCOUNTER — Other Ambulatory Visit: Payer: Self-pay

## 2024-08-28 MED ORDER — PALIPERIDONE ER 6 MG PO TB24
6.0000 mg | ORAL_TABLET | Freq: Every day | ORAL | 0 refills | Status: AC
  Filled 2024-08-28: qty 30, 30d supply, fill #0

## 2024-08-29 ENCOUNTER — Other Ambulatory Visit (HOSPITAL_COMMUNITY): Payer: Self-pay

## 2024-09-05 ENCOUNTER — Other Ambulatory Visit (HOSPITAL_COMMUNITY): Payer: Self-pay

## 2024-09-28 ENCOUNTER — Ambulatory Visit
